# Patient Record
Sex: Male | Born: 1945 | Race: White | Hispanic: No | Marital: Married | Smoking: Former smoker
Health system: Southern US, Community
[De-identification: ages and names within clinical notes are randomized; demographics above are authoritative.]

## PROBLEM LIST (undated history)

## (undated) DIAGNOSIS — E785 Hyperlipidemia, unspecified: Secondary | ICD-10-CM

## (undated) DIAGNOSIS — E119 Type 2 diabetes mellitus without complications: Secondary | ICD-10-CM

## (undated) DIAGNOSIS — I639 Cerebral infarction, unspecified: Secondary | ICD-10-CM

## (undated) DIAGNOSIS — I499 Cardiac arrhythmia, unspecified: Secondary | ICD-10-CM

## (undated) DIAGNOSIS — I10 Essential (primary) hypertension: Secondary | ICD-10-CM

## (undated) DIAGNOSIS — K759 Inflammatory liver disease, unspecified: Secondary | ICD-10-CM

## (undated) DIAGNOSIS — I723 Aneurysm of iliac artery: Secondary | ICD-10-CM

## (undated) DIAGNOSIS — I6529 Occlusion and stenosis of unspecified carotid artery: Secondary | ICD-10-CM

## (undated) DIAGNOSIS — K219 Gastro-esophageal reflux disease without esophagitis: Secondary | ICD-10-CM

## (undated) DIAGNOSIS — C801 Malignant (primary) neoplasm, unspecified: Secondary | ICD-10-CM

## (undated) HISTORY — PX: OTHER SURGICAL HISTORY: SHX169

## (undated) HISTORY — DX: Hyperlipidemia, unspecified: E78.5

## (undated) HISTORY — DX: Cerebral infarction, unspecified: I63.9

## (undated) HISTORY — DX: Aneurysm of iliac artery: I72.3

## (undated) HISTORY — PX: CAROTID ENDARTERECTOMY: SUR193

## (undated) HISTORY — PX: KIDNEY STONE SURGERY: SHX686

## (undated) HISTORY — DX: Occlusion and stenosis of unspecified carotid artery: I65.29

## (undated) HISTORY — DX: Essential (primary) hypertension: I10

## (undated) HISTORY — DX: Cardiac arrhythmia, unspecified: I49.9

## (undated) HISTORY — PX: TONSILLECTOMY: SUR1361

---

## 2005-10-09 ENCOUNTER — Emergency Department (HOSPITAL_COMMUNITY): Admission: EM | Admit: 2005-10-09 | Discharge: 2005-10-09 | Payer: Self-pay | Admitting: Emergency Medicine

## 2008-02-05 ENCOUNTER — Emergency Department (HOSPITAL_COMMUNITY): Admission: EM | Admit: 2008-02-05 | Discharge: 2008-02-05 | Payer: Self-pay | Admitting: Emergency Medicine

## 2008-06-23 ENCOUNTER — Encounter: Admission: RE | Admit: 2008-06-23 | Discharge: 2008-06-23 | Payer: Self-pay | Admitting: Internal Medicine

## 2009-10-28 ENCOUNTER — Emergency Department (HOSPITAL_COMMUNITY): Admission: EM | Admit: 2009-10-28 | Discharge: 2009-10-28 | Payer: Self-pay | Admitting: Emergency Medicine

## 2010-06-17 LAB — POCT I-STAT, CHEM 8
Calcium, Ion: 1.08 mmol/L — ABNORMAL LOW (ref 1.12–1.32)
Chloride: 105 mEq/L (ref 96–112)
Glucose, Bld: 131 mg/dL — ABNORMAL HIGH (ref 70–99)
Sodium: 139 mEq/L (ref 135–145)
TCO2: 26 mmol/L (ref 0–100)

## 2010-06-17 LAB — POCT CARDIAC MARKERS
CKMB, poc: 1.4 ng/mL (ref 1.0–8.0)
Troponin i, poc: <0.05 ng/mL (ref 0.00–0.09)

## 2010-07-18 ENCOUNTER — Other Ambulatory Visit: Payer: Self-pay | Admitting: Internal Medicine

## 2010-07-18 DIAGNOSIS — I723 Aneurysm of iliac artery: Secondary | ICD-10-CM

## 2010-07-21 ENCOUNTER — Other Ambulatory Visit: Payer: Self-pay

## 2010-07-24 ENCOUNTER — Ambulatory Visit
Admission: RE | Admit: 2010-07-24 | Discharge: 2010-07-24 | Disposition: A | Discharge status: Home / Self Care | Payer: Medicare Other | Source: Ambulatory Visit | Attending: Internal Medicine | Admitting: Internal Medicine

## 2010-07-24 DIAGNOSIS — I723 Aneurysm of iliac artery: Secondary | ICD-10-CM

## 2010-07-24 MED ORDER — IOHEXOL 350 MG/ML SOLN
100.0000 mL | Freq: Once | INTRAVENOUS | Status: AC | PRN
  Administered 2010-07-24: 100 mL via INTRAVENOUS

## 2010-08-29 ENCOUNTER — Encounter (INDEPENDENT_AMBULATORY_CARE_PROVIDER_SITE_OTHER): Payer: Medicare Other | Admitting: Vascular Surgery

## 2010-08-29 DIAGNOSIS — I723 Aneurysm of iliac artery: Secondary | ICD-10-CM

## 2010-08-30 NOTE — Consult Note (Signed)
NEW PATIENT CONSULTATION  HAGAN, Aden DOB:  November 23, 1945                                       08/29/2010 EAVWU#:[id number]  The patient is a 65 year old male patient referred by Dr. 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 The aneurysm measures 2.7 x 2.5 cm and extends over 3 cm in length in the right common iliac artery.  The left common iliac artery is totally occluded at its origin with reconstitution of the left external and internal iliac arteries.  The infrarenal aorta has diffuse atherosclerosis.  I have reviewed the CT scan.  The patient denies any claudication symptoms in the left leg including hip, thigh, and buttock. He does play golf on a regular basis and is able to walk long distances.  CHRONIC MEDICAL PROBLEMS: 1. Type 2 diabetes mellitus. 2. Hypertension. 3. Hyperlipidemia. 4. Chronic obstructive pulmonary disease. 5. History of kidney stone. 6. Negative for coronary artery disease. 7. History of a possible remote small stroke many years ago on a CT     scan of the head, symptomatic  SOCIAL HISTORY:  He is married and is retired.  Smokes 6 cigars per day. He smoked a pack cigarettes per day for 45 to 50 years, quit in 2008. Drinks an occasional glass of wine.  FAMILY HISTORY:  Positive for diabetes in his mother.  Negative for coronary artery disease and stroke.  REVIEW OF SYSTEMS:  Denies any chest pain, dyspnea on exertion, PND, orthopnea.  He has not nocturnal leg cramps.  All other systems are negative on complete review of systems.  PHYSICAL EXAMINATION:  Vital Signs:  Blood pressure 153/69, heart rate 90, respirations 22.  General:  Well-developed, well-nourished male in no apparent distress, alert and oriented x3.  HEENT:  Exam normal for age.  EOMs intact.  LUNGS:  Clear to auscultation.  No rhonchi or wheezing.  Cardiovascular:  Regular rhythm.  No murmurs.  Carotid pulses 3+.  No  bruits.  Abdomen:  Soft, nontender, with no masses. Musculoskeletal:  Exam is free of major deformities.  Neurologic:  Exam is normal.  Skin:  Free of rashes.  Extremities:  Lower extremity exam reveals right leg has 3+ femoral popliteal and posterior tibial pulse. Left leg has absent femoral and distal pulses.  Both feet are well perfused.  He does have an asymptomatic 2.7 cm right common iliac artery aneurysm which we will need to follow with CTAs on an annual basis.  He also has significant atherosclerosis with a left common iliac artery occlusion which is asymptomatic.  He will return in 1 year with ABIs and a CT angiogram unless he develops any symptoms in the interim.    Quita Skye Hart [place], M.D. Electronically Signed  JDL/MEDQ  D:  08/29/2010  T:  08/30/2010  Job:  [id number]  cc:   Georgann Housekeeper, MD

## 2011-04-24 DIAGNOSIS — M25529 Pain in unspecified elbow: Secondary | ICD-10-CM | POA: Diagnosis not present

## 2011-05-29 DIAGNOSIS — K091 Developmental (nonodontogenic) cysts of oral region: Secondary | ICD-10-CM | POA: Diagnosis not present

## 2011-06-11 ENCOUNTER — Other Ambulatory Visit: Payer: Self-pay | Admitting: *Deleted

## 2011-06-11 DIAGNOSIS — I723 Aneurysm of iliac artery: Secondary | ICD-10-CM

## 2011-07-04 DIAGNOSIS — E119 Type 2 diabetes mellitus without complications: Secondary | ICD-10-CM | POA: Diagnosis not present

## 2011-07-04 DIAGNOSIS — I10 Essential (primary) hypertension: Secondary | ICD-10-CM | POA: Diagnosis not present

## 2011-07-04 DIAGNOSIS — E782 Mixed hyperlipidemia: Secondary | ICD-10-CM | POA: Diagnosis not present

## 2011-07-04 DIAGNOSIS — Z125 Encounter for screening for malignant neoplasm of prostate: Secondary | ICD-10-CM | POA: Diagnosis not present

## 2011-07-04 DIAGNOSIS — Z0000 Encounter for general adult medical examination without abnormal findings: Secondary | ICD-10-CM | POA: Diagnosis not present

## 2011-07-04 DIAGNOSIS — Z1331 Encounter for screening for depression: Secondary | ICD-10-CM | POA: Diagnosis not present

## 2011-07-11 DIAGNOSIS — E782 Mixed hyperlipidemia: Secondary | ICD-10-CM | POA: Diagnosis not present

## 2011-07-11 DIAGNOSIS — I10 Essential (primary) hypertension: Secondary | ICD-10-CM | POA: Diagnosis not present

## 2011-07-11 DIAGNOSIS — J449 Chronic obstructive pulmonary disease, unspecified: Secondary | ICD-10-CM | POA: Diagnosis not present

## 2011-07-11 DIAGNOSIS — Z0000 Encounter for general adult medical examination without abnormal findings: Secondary | ICD-10-CM | POA: Diagnosis not present

## 2011-07-11 DIAGNOSIS — I714 Abdominal aortic aneurysm, without rupture: Secondary | ICD-10-CM | POA: Diagnosis not present

## 2011-07-11 DIAGNOSIS — E119 Type 2 diabetes mellitus without complications: Secondary | ICD-10-CM | POA: Diagnosis not present

## 2011-08-20 ENCOUNTER — Encounter: Payer: Self-pay | Admitting: Vascular Surgery

## 2011-08-28 ENCOUNTER — Ambulatory Visit: Payer: Medicare Other | Admitting: Vascular Surgery

## 2011-08-28 ENCOUNTER — Other Ambulatory Visit: Payer: Medicare Other

## 2011-09-03 ENCOUNTER — Encounter: Payer: Self-pay | Admitting: Vascular Surgery

## 2011-09-04 ENCOUNTER — Encounter (INDEPENDENT_AMBULATORY_CARE_PROVIDER_SITE_OTHER): Payer: Medicare Other | Admitting: *Deleted

## 2011-09-04 ENCOUNTER — Ambulatory Visit (INDEPENDENT_AMBULATORY_CARE_PROVIDER_SITE_OTHER): Payer: Medicare Other | Admitting: Vascular Surgery

## 2011-09-04 ENCOUNTER — Ambulatory Visit
Admission: RE | Admit: 2011-09-04 | Discharge: 2011-09-04 | Disposition: A | Discharge status: Home / Self Care | Payer: Medicare Other | Source: Ambulatory Visit | Attending: Vascular Surgery | Admitting: Vascular Surgery

## 2011-09-04 ENCOUNTER — Encounter: Payer: Self-pay | Admitting: Vascular Surgery

## 2011-09-04 VITALS — BP 155/77 | HR 96 | Resp 20 | Ht 71.5 in | Wt 202.0 lb

## 2011-09-04 DIAGNOSIS — I723 Aneurysm of iliac artery: Secondary | ICD-10-CM

## 2011-09-04 DIAGNOSIS — I714 Abdominal aortic aneurysm, without rupture, unspecified: Secondary | ICD-10-CM

## 2011-09-04 DIAGNOSIS — I739 Peripheral vascular disease, unspecified: Secondary | ICD-10-CM

## 2011-09-04 MED ORDER — IOHEXOL 350 MG/ML SOLN
100.0000 mL | Freq: Once | INTRAVENOUS | Status: AC | PRN
  Administered 2011-09-04: 100 mL via INTRAVENOUS

## 2011-09-04 NOTE — Progress Notes (Signed)
Subjective:     Patient ID: Samuel Berger, male   DOB: February 01, 1946, 66 y.o.   MRN: [id number]  HPI this 66 year old male returns today for followup regarding a right iliac artery aneurysm discovered last year by CT scan. The scan last year revealed the aneurysm to be approximately 2.7 cm in maximum diameter. 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 He denies any abdominal or back pain. He denies any claudication symptoms in either lower extremity being able to ambulate long distances he states.  Past Medical History  Diagnosis Date  . Hypertension   . Hyperlipidemia   . Stroke   . Iliac artery aneurysm, right   . Diabetes mellitus   . Kidney stone   . CAD (coronary artery disease)     History  Substance Use Topics  . Smoking status: Current Everyday Smoker -- 0.5 packs/day for 30 years    Types: Cigars  . Smokeless tobacco: Never Used  . Alcohol Use: Yes    Family History  Problem Relation Age of Onset  . Diabetes Mother     Allergies  Allergen Reactions  . Sulfa Drugs Cross Reactors     Current outpatient prescriptions:amLODipine (NORVASC) 10 MG tablet, Take 10 mg by mouth daily., Disp: , Rfl: ;  atorvastatin (LIPITOR) 20 MG tablet, Take 20 mg by mouth daily., Disp: , Rfl: ;  lisinopril (PRINIVIL,ZESTRIL) 5 MG tablet, Take 5 mg by mouth daily., Disp: , Rfl: ;  METFORMIN HCL PO, Take by mouth., Disp: , Rfl:  No current facility-administered medications for this visit. Facility-Administered Medications Ordered in Other Visits: iohexol (OMNIPAQUE) 350 MG/ML injection 100 mL, 100 mL, Intravenous, Once PRN, Medication Radiologist, MD, 100 mL at 09/04/11 0909  BP 155/77  Pulse 96  Resp 20  Ht 5' 11.5" (1.816 m)  Wt 202 lb (91.627 kg)  BMI 27.78 kg/m2  Body mass index is 27.78 kg/(m^2).          Review of Systems denies chest pain, dyspnea on exertion, PND, orthopnea, hemoptysis, claudication.    Objective:   Physical Exam blood  pressure 155/77 heart rate 96 respirations 20 Gen.-alert and oriented x3 in no apparent distress HEENT normal for age Lungs no rhonchi or wheezing Cardiovascular regular rhythm no murmurs carotid pulses 3+ palpable no bruits audible Abdomen soft nontender no palpable masses Musculoskeletal free of  major deformities Skin clear -no rashes Neurologic normal Lower extremities 3+ femoral and dorsalis pedis pulses palpable bilaterally with no edema  Today I ordered a CT angiogram of the abdomen and pelvis which are reviewed and interpreted by computer. The right iliac artery aneurysm is unchanged. 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 Left external iliac artery remains occluded.    Assessment:     Stable right common iliac artery aneurysm and left external iliac artery occlusion with no claudication    Plan:     Return in 2 years with CT angiogram of abdomen and pelvis for continued followup and see nurse practitioner

## 2011-09-05 NOTE — Progress Notes (Signed)
Addended by: Sharee Pimple on: 09/05/2011 09:44 AM   Modules accepted: Orders

## 2011-09-18 DIAGNOSIS — H353190 Nonexudative age-related macular degeneration, unspecified eye, stage unspecified: Secondary | ICD-10-CM | POA: Diagnosis not present

## 2011-09-18 DIAGNOSIS — H2510 Age-related nuclear cataract, unspecified eye: Secondary | ICD-10-CM | POA: Diagnosis not present

## 2011-11-07 DIAGNOSIS — D485 Neoplasm of uncertain behavior of skin: Secondary | ICD-10-CM | POA: Diagnosis not present

## 2011-11-07 DIAGNOSIS — B079 Viral wart, unspecified: Secondary | ICD-10-CM | POA: Diagnosis not present

## 2011-12-06 DIAGNOSIS — I10 Essential (primary) hypertension: Secondary | ICD-10-CM | POA: Diagnosis not present

## 2011-12-06 DIAGNOSIS — R42 Dizziness and giddiness: Secondary | ICD-10-CM | POA: Diagnosis not present

## 2012-01-05 ENCOUNTER — Encounter (HOSPITAL_COMMUNITY): Payer: Self-pay | Admitting: Emergency Medicine

## 2012-01-05 ENCOUNTER — Emergency Department (HOSPITAL_COMMUNITY): Payer: Medicare Other

## 2012-01-05 ENCOUNTER — Inpatient Hospital Stay (HOSPITAL_COMMUNITY): Payer: Medicare Other

## 2012-01-05 ENCOUNTER — Inpatient Hospital Stay (HOSPITAL_COMMUNITY)
Admission: EM | Admit: 2012-01-05 | Discharge: 2012-01-08 | DRG: 062 | Disposition: A | Discharge status: Home / Self Care | Payer: Medicare Other | Attending: Neurology | Admitting: Neurology

## 2012-01-05 DIAGNOSIS — Z833 Family history of diabetes mellitus: Secondary | ICD-10-CM

## 2012-01-05 DIAGNOSIS — R5381 Other malaise: Secondary | ICD-10-CM | POA: Diagnosis not present

## 2012-01-05 DIAGNOSIS — F17200 Nicotine dependence, unspecified, uncomplicated: Secondary | ICD-10-CM | POA: Diagnosis present

## 2012-01-05 DIAGNOSIS — I2510 Atherosclerotic heart disease of native coronary artery without angina pectoris: Secondary | ICD-10-CM | POA: Diagnosis present

## 2012-01-05 DIAGNOSIS — I6340 Cerebral infarction due to embolism of unspecified cerebral artery: Principal | ICD-10-CM | POA: Diagnosis present

## 2012-01-05 DIAGNOSIS — Z7902 Long term (current) use of antithrombotics/antiplatelets: Secondary | ICD-10-CM | POA: Diagnosis not present

## 2012-01-05 DIAGNOSIS — R29898 Other symptoms and signs involving the musculoskeletal system: Secondary | ICD-10-CM | POA: Diagnosis not present

## 2012-01-05 DIAGNOSIS — R61 Generalized hyperhidrosis: Secondary | ICD-10-CM | POA: Diagnosis not present

## 2012-01-05 DIAGNOSIS — E785 Hyperlipidemia, unspecified: Secondary | ICD-10-CM | POA: Diagnosis present

## 2012-01-05 DIAGNOSIS — Z882 Allergy status to sulfonamides status: Secondary | ICD-10-CM

## 2012-01-05 DIAGNOSIS — I6789 Other cerebrovascular disease: Secondary | ICD-10-CM | POA: Diagnosis not present

## 2012-01-05 DIAGNOSIS — Z79899 Other long term (current) drug therapy: Secondary | ICD-10-CM

## 2012-01-05 DIAGNOSIS — R4701 Aphasia: Secondary | ICD-10-CM | POA: Diagnosis present

## 2012-01-05 DIAGNOSIS — I63239 Cerebral infarction due to unspecified occlusion or stenosis of unspecified carotid arteries: Secondary | ICD-10-CM | POA: Diagnosis not present

## 2012-01-05 DIAGNOSIS — Z8673 Personal history of transient ischemic attack (TIA), and cerebral infarction without residual deficits: Secondary | ICD-10-CM

## 2012-01-05 DIAGNOSIS — I639 Cerebral infarction, unspecified: Secondary | ICD-10-CM

## 2012-01-05 DIAGNOSIS — I10 Essential (primary) hypertension: Secondary | ICD-10-CM | POA: Diagnosis present

## 2012-01-05 DIAGNOSIS — I723 Aneurysm of iliac artery: Secondary | ICD-10-CM | POA: Diagnosis present

## 2012-01-05 DIAGNOSIS — G8190 Hemiplegia, unspecified affecting unspecified side: Secondary | ICD-10-CM | POA: Diagnosis present

## 2012-01-05 DIAGNOSIS — I6350 Cerebral infarction due to unspecified occlusion or stenosis of unspecified cerebral artery: Secondary | ICD-10-CM | POA: Diagnosis not present

## 2012-01-05 DIAGNOSIS — E119 Type 2 diabetes mellitus without complications: Secondary | ICD-10-CM | POA: Diagnosis not present

## 2012-01-05 DIAGNOSIS — I6529 Occlusion and stenosis of unspecified carotid artery: Secondary | ICD-10-CM | POA: Diagnosis present

## 2012-01-05 DIAGNOSIS — R29810 Facial weakness: Secondary | ICD-10-CM | POA: Diagnosis not present

## 2012-01-05 DIAGNOSIS — E041 Nontoxic single thyroid nodule: Secondary | ICD-10-CM | POA: Diagnosis not present

## 2012-01-05 DIAGNOSIS — R5383 Other fatigue: Secondary | ICD-10-CM | POA: Diagnosis not present

## 2012-01-05 DIAGNOSIS — I6509 Occlusion and stenosis of unspecified vertebral artery: Secondary | ICD-10-CM | POA: Diagnosis not present

## 2012-01-05 LAB — CBC
Hemoglobin: 15.7 g/dL (ref 13.0–17.0)
MCH: 31 pg (ref 26.0–34.0)
Platelets: 206 10*3/uL (ref 150–400)
RBC: 5.07 MIL/uL (ref 4.22–5.81)

## 2012-01-05 LAB — POCT I-STAT TROPONIN I

## 2012-01-05 LAB — URINALYSIS, ROUTINE W REFLEX MICROSCOPIC
Hgb urine dipstick: NEGATIVE
Ketones, ur: NEGATIVE mg/dL
Protein, ur: NEGATIVE mg/dL
Urobilinogen, UA: 1 mg/dL (ref 0.0–1.0)

## 2012-01-05 LAB — COMPREHENSIVE METABOLIC PANEL
ALT: 14 U/L (ref 0–53)
AST: 15 U/L (ref 0–37)
Albumin: 4.1 g/dL (ref 3.5–5.2)
Calcium: 9.8 mg/dL (ref 8.4–10.5)
Chloride: 99 mEq/L (ref 96–112)
Creatinine, Ser: 0.83 mg/dL (ref 0.50–1.35)
Sodium: 135 mEq/L (ref 135–145)

## 2012-01-05 LAB — POCT I-STAT, CHEM 8
BUN: 18 mg/dL (ref 6–23)
Calcium, Ion: 1.15 mmol/L (ref 1.13–1.30)
Creatinine, Ser: 1 mg/dL (ref 0.50–1.35)
Glucose, Bld: 121 mg/dL — ABNORMAL HIGH (ref 70–99)
TCO2: 24 mmol/L (ref 0–100)

## 2012-01-05 LAB — RAPID URINE DRUG SCREEN, HOSP PERFORMED
Amphetamines: NOT DETECTED
Cocaine: NOT DETECTED
Opiates: NOT DETECTED
Tetrahydrocannabinol: NOT DETECTED

## 2012-01-05 LAB — MRSA PCR SCREENING: MRSA by PCR: NEGATIVE

## 2012-01-05 LAB — APTT: aPTT: 27 seconds (ref 24–37)

## 2012-01-05 LAB — DIFFERENTIAL
Basophils Relative: 1 % (ref 0–1)
Eosinophils Relative: 6 % — ABNORMAL HIGH (ref 0–5)
Lymphs Abs: 4.6 10*3/uL — ABNORMAL HIGH (ref 0.7–4.0)
Monocytes Absolute: 1.9 10*3/uL — ABNORMAL HIGH (ref 0.1–1.0)
Monocytes Relative: 14 % — ABNORMAL HIGH (ref 3–12)

## 2012-01-05 MED ORDER — SODIUM CHLORIDE 0.9 % IV SOLN: INTRAVENOUS | Status: DC

## 2012-01-05 MED ORDER — ACETAMINOPHEN 325 MG PO TABS: 650.0000 mg | ORAL_TABLET | ORAL | Status: DC | PRN

## 2012-01-05 MED ORDER — ONDANSETRON HCL 4 MG/2ML IJ SOLN: 4.0000 mg | Freq: Four times a day (QID) | INTRAMUSCULAR | Status: DC | PRN

## 2012-01-05 MED ORDER — ACETAMINOPHEN 650 MG RE SUPP: 650.0000 mg | RECTAL | Status: DC | PRN

## 2012-01-05 MED ORDER — PANTOPRAZOLE SODIUM 40 MG IV SOLR
40.0000 mg | Freq: Every day | INTRAVENOUS | Status: DC
  Administered 2012-01-05: 40 mg via INTRAVENOUS
  Filled 2012-01-05 (×2): qty 40

## 2012-01-05 MED ORDER — ALTEPLASE (STROKE) FULL DOSE INFUSION
0.9000 mg/kg | Freq: Once | INTRAVENOUS | Status: AC
  Administered 2012-01-05: 83 mg via INTRAVENOUS
  Filled 2012-01-05: qty 83

## 2012-01-05 MED ORDER — SODIUM CHLORIDE 0.9 % IV SOLN
INTRAVENOUS | Status: DC
  Administered 2012-01-05: 50 mL via INTRAVENOUS

## 2012-01-05 MED ORDER — LABETALOL HCL 5 MG/ML IV SOLN: 10.0000 mg | INTRAVENOUS | Status: DC | PRN

## 2012-01-05 MED ORDER — IOHEXOL 350 MG/ML SOLN
50.0000 mL | Freq: Once | INTRAVENOUS | Status: AC | PRN
  Administered 2012-01-05: 50 mL via INTRAVENOUS

## 2012-01-05 MED ORDER — PANTOPRAZOLE SODIUM 40 MG IV SOLR: 40.0000 mg | Freq: Every day | INTRAVENOUS | Status: DC

## 2012-01-05 NOTE — ED Notes (Signed)
TPA, finished, pt being transported to CT 3 by RN, on monitor.

## 2012-01-05 NOTE — ED Notes (Addendum)
Pt was eatting at restaurant and slumped over after smoking a cigarette outside.

## 2012-01-05 NOTE — H&P (Signed)
Admission H&P    Chief Complaint: Right sided weakness HPI: Samuel Berger is an 66 y.o. male with sudden onset right sided weakness at approximately 5:20 this afternoon. He was brought by EMS and on arrival was found to have severe right sided weakness.   LSN: 17:20 tPA Given: Yes  Past Medical History  Diagnosis Date  . Hypertension   . Hyperlipidemia   . Stroke   . Iliac artery aneurysm, right   . Diabetes mellitus   . Kidney stone   . CAD (coronary artery disease)     History reviewed. No pertinent past surgical history.  Family History  Problem Relation Age of Onset  . Diabetes Mother    Social History:  reports that he has been smoking Cigars.  He has never used smokeless tobacco. He reports that he drinks alcohol. He reports that he does not use illicit drugs.  Allergies:  Allergies  Allergen Reactions  . Sulfa Drugs Cross Reactors     Medications Prior to Admission  Medication Sig Dispense Refill  . amLODipine (NORVASC) 10 MG tablet Take 10 mg by mouth daily.      Marland Kitchen atorvastatin (LIPITOR) 20 MG tablet Take 20 mg by mouth daily.      Marland Kitchen lisinopril (PRINIVIL,ZESTRIL) 5 MG tablet Take 5 mg by mouth daily.      Marland Kitchen METFORMIN HCL PO Take by mouth.        ROS: Unable to obtain as patient unable to speak.   Physical Examination: Blood pressure 141/59, pulse 89, temperature 97.8 F (36.6 C), temperature source Oral, resp. rate 21, SpO2 94.00%.  General Examination: HEENT-  Normocephalic, no lesions, without obvious abnormality. Normal pharynx. Neck supple with no masses, nodes, nodules or enlargement. Cardiovascular - regular rate and rhythm Lungs - no tachypnea, retractions or cyanosis, Heart exam - S1, S2 normal, no murmur, no gallop, rate regular Abdomen - normal findings: soft, non-tender Extremities - no edema  Neurologic Examination: Mental Status: Awake, Alert, able to only answer with one - 2 words and is very dysarthric.  Cranial Nerves: II-no clear  visual field cut but may have small right sided cut. Discs difficult to visualize.  II/IV/VI- right gaze preference, unable to cross midline to left V/VII-right facial droop, decreased sensation on right VIII-intact to voice IX/X- palate elevates symmetrically XI- decreased shrug on right XII-does not fully protrude tongue Motor: 2/5 in right arm and right leg, 5/5 in left arm/left leg Sensory: Decreased sensation on right DTR's: 2+ and symmetric in patella and biceps Cerebellar: No dysmetria on left, unable to perform on right Gait: Did not assessed secondary to patient safety concerns  Laboratory Studies:   Basic Metabolic Panel:  Lab 01/05/12 4098 01/05/12 1756  NA 138 135  K 3.7 3.7  CL 103 99  CO2 -- 23  GLUCOSE 121* 122*  BUN 18 18  CREATININE 1.00 0.83  CALCIUM -- 9.8  MG -- --  PHOS -- --    Liver Function Tests:  Lab 01/05/12 1756  AST 15  ALT 14  ALKPHOS 73  BILITOT 0.3  PROT 7.1  ALBUMIN 4.1   No results found for this basename: LIPASE:5,AMYLASE:5 in the last 168 hours No results found for this basename: AMMONIA:3 in the last 168 hours  CBC:  Lab 01/05/12 1803 01/05/12 1756  WBC -- 13.9*  NEUTROABS -- 6.6  HGB 15.6 15.7  HCT 46.0 43.7  MCV -- 86.2  PLT -- 206    Cardiac Enzymes:  Lab  01/05/12 1757  CKTOTAL --  CKMB --  CKMBINDEX --  TROPONINI <0.30    BNP: No components found with this basename: POCBNP:5  CBG:  Lab 01/05/12 1802  GLUCAP 114*    Microbiology: No results found for this or any previous visit.  Coagulation Studies:  Basename 01/05/12 1756  LABPROT 12.6  INR 0.95    Urinalysis:  Lab 01/05/12 1901  COLORURINE YELLOW  LABSPEC 1.029  PHURINE 5.0  GLUCOSEU NEGATIVE  HGBUR NEGATIVE  BILIRUBINUR SMALL*  KETONESUR NEGATIVE  PROTEINUR NEGATIVE  UROBILINOGEN 1.0  NITRITE NEGATIVE  LEUKOCYTESUR NEGATIVE    Lipid Panel:  No results found for this basename: chol, trig, hdl, cholhdl, vldl, ldlcalc    HgbA1C:   No results found for this basename: HGBA1C    Urine Drug Screen:     Component Value Date/Time   LABOPIA NONE DETECTED 01/05/2012 1902   COCAINSCRNUR NONE DETECTED 01/05/2012 1902   LABBENZ NONE DETECTED 01/05/2012 1902   AMPHETMU NONE DETECTED 01/05/2012 1902   THCU NONE DETECTED 01/05/2012 1902   LABBARB NONE DETECTED 01/05/2012 1902    Alcohol Level: No results found for this basename: ETH:2 in the last 168 hours  Other results: EKG: Sinus  Imaging: Ct Angio Head W/cm &/or Wo Cm  01/05/2012  *RADIOLOGY REPORT*  Clinical Data:  Code stroke.  Left MCA territory.  CT ANGIOGRAPHY HEAD AND NECK  Technique:  Multidetector CT imaging of the head and neck was performed using the standard protocol during bolus administration of intravenous contrast.  Multiplanar CT image reconstructions including MIPs were obtained to evaluate the vascular anatomy. Carotid stenosis measurements (when applicable) are obtained utilizing NASCET criteria, using the distal internal carotid diameter as the denominator.  Contrast: 50mL OMNIPAQUE IOHEXOL 350 MG/ML SOLN  Comparison:  Head CT same day  CTA NECK  Findings:  Lung apices are clear except for mild scarring.  No superior mediastinal pathology.  There is atherosclerosis of the aorta.  Branching pattern of brachiocephalic vessels is normal.  No origin stenoses.  The right common carotid artery is widely patent to the bifurcation.  There is severe stenosis of the proximal ICA with a string sign.  This consistent with 95% or greater stenosis.  Beyond that, the cervical internal carotid artery shows flow but is markedly diminished in caliber because of the reduced inflow.  The left common carotid arteries widely patent to the bifurcation. There is atherosclerotic disease at the carotid bifurcation. Minimal diameter of the proximal internal carotid artery is 4 mm. Compared to a more distal cervical ICA diameter of 5 mm, this indicates a 20% stenosis.  There is atherosclerosis  of the right vertebral artery origin. This is a small vessel.  Therefore, the stenosis is difficult to accurately measure.  I estimate this at 50%.  The left vertebral artery is also a small vessel.  There does not appear to be any stenosis at its origin.  Both vertebral arteries, though a small, are patent through the cervical region to the foramen magnum.  The soft tissues of the neck are unremarkable except for the thyroid nodule the lower pole on the left measuring 2 cm in diameter.  Ultrasound would be suggested to evaluate this if clinically appropriate.   Review of the MIP images confirms the above findings.  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  Beyond that, the vessel is markedly narrowed because of diminished inflow.  20% stenosis of the proximal left internal carotid  artery.  Both vertebral arteries are diminutive vessels but are patent through the neck.  I estimate there is a 50% stenosis of the proximal right vertebral artery.  CTA HEAD  Findings:  Both internal carotid arteries are patent through the skull base, with the right being a diminutive vessel because of reduced inflow.  There is extensive atherosclerotic calcification in both carotid siphon regions.  Stenosis in that region is estimated at 50-70% bilaterally.  The right anterior and middle cerebral vessels are patent without proximal stenosis, aneurysm or vascular malformation.  The left anterior and middle cerebral vessels are patent.  The M1 segment shows pronounced irregularity.  Stenosis at the M1 origin is estimated at 50%.  I do not see an intraluminal filling defect at this moment however.  No missing distal branches are appreciated.  The right vertebral artery is a small vessel that appears to be patent to the basilar.  The left vertebral artery is a small vessel patent to the foramen magnum but without demonstrable flow beyond that.  There is reconstitution or retrograde flow within  the distal left vertebral.  The basilar artery is a small vessel but does show flow.  Both posterior cerebral arteries receive there supply from the anterior circulation.  The brain shows old small vessel infarctions within the deep white matter.  There is no identifiable acute infarction.  No intracranial hemorrhage.   Review of the MIP images confirms the above findings.  IMPRESSION: The right internal carotid artery is diminutive because of reduced inflow.  Both internal carotid arteries are patent through the siphon region.  There is extensive calcific atherosclerosis of both carotid siphons with stenoses on the order of 50-70% bilaterally.  Both anterior and middle cerebral vessels are patent.  No evidence of intraluminal filling defect.  There is irregularity of the M1 segment on the left with stenosis of the left M1 segment of 50%. No missing distal branch vessels are identified.  Both vertebral arteries are diminutive.  The right vertebral artery is patent to the basilar.  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  There is reconstituted or retrograde flow in the distal left vertebral artery.  The basilar artery is a very diminutive vessel but does show flow.  This vessel supplies both superior cerebellar arteries.  Both posterior cerebral arteries receive most to their supply from the anterior circulation.   Original Report Authenticated By: Thomasenia Sales, M.D.    Ct Head Wo Contrast  01/05/2012  *RADIOLOGY REPORT*  Clinical Data: Code stroke.  Right-sided weakness.  Right facial droop.  CT HEAD WITHOUT CONTRAST  Technique:  Contiguous axial images were obtained from the base of the skull through the vertex without contrast.  Comparison: 06/23/2008  Findings: The cerebellum, brain stem, cerebral peduncles, and thalami appear normal.  A small remote lacunar infarct involving the anterior limb of the left internal capsule and head of the left caudate nucleus is observed.  There  is also a small lacunar infarct in the head of the right caudate nucleus appears old.  The basal ganglia appear otherwise unremarkable.  Ventricular system and basilar cisterns unremarkable.  Mild chronic left maxillary sinusitis noted. No intracranial hemorrhage, mass lesion, or acute infarction is identified.  IMPRESSION:  1.  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 No acute intracranial findings. 2.  Mild chronic left maxillary sinusitis.  I discussed these findings by telephone with  Dr. Viviann Spare Rancour at 6:11 p.m. on 01/05/2012.   Original Report Authenticated By: Dellia Cloud, M.D.    Ct Angio Neck W/cm &/or Wo/cm  01/05/2012  *RADIOLOGY REPORT*  Clinical Data:  Code stroke.  Left MCA territory.  CT ANGIOGRAPHY HEAD AND NECK  Technique:  Multidetector CT imaging of the head and neck was performed using the standard protocol during bolus administration of intravenous contrast.  Multiplanar CT image reconstructions including MIPs were obtained to evaluate the vascular anatomy. Carotid stenosis measurements (when applicable) are obtained utilizing NASCET criteria, using the distal internal carotid diameter as the denominator.  Contrast: 50mL OMNIPAQUE IOHEXOL 350 MG/ML SOLN  Comparison:  Head CT same day  CTA NECK  Findings:  Lung apices are clear except for mild scarring.  No superior mediastinal pathology.  There is atherosclerosis of the aorta.  Branching pattern of brachiocephalic vessels is normal.  No origin stenoses.  The right common carotid artery is widely patent to the bifurcation.  There is severe stenosis of the proximal ICA with a string sign.  This consistent with 95% or greater stenosis.  Beyond that, the cervical internal carotid artery shows flow but is markedly diminished in caliber because of the reduced inflow.  The left common carotid arteries widely patent to the bifurcation. There is  atherosclerotic disease at the carotid bifurcation. Minimal diameter of the proximal internal carotid artery is 4 mm. Compared to a more distal cervical ICA diameter of 5 mm, this indicates a 20% stenosis.  There is atherosclerosis of the right vertebral artery origin. This is a small vessel.  Therefore, the stenosis is difficult to accurately measure.  I estimate this at 50%.  The left vertebral artery is also a small vessel.  There does not appear to be any stenosis at its origin.  Both vertebral arteries, though a small, are patent through the cervical region to the foramen magnum.  The soft tissues of the neck are unremarkable except for the thyroid nodule the lower pole on the left measuring 2 cm in diameter.  Ultrasound would be suggested to evaluate this if clinically appropriate.   Review of the MIP images confirms the above findings.  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  Beyond that, the vessel is markedly narrowed because of diminished inflow.  20% stenosis of the proximal left internal carotid artery.  Both vertebral arteries are diminutive vessels but are patent through the neck.  I estimate there is a 50% stenosis of the proximal right vertebral artery.  CTA HEAD  Findings:  Both internal carotid arteries are patent through the skull base, with the right being a diminutive vessel because of reduced inflow.  There is extensive atherosclerotic calcification in both carotid siphon regions.  Stenosis in that region is estimated at 50-70% bilaterally.  The right anterior and middle cerebral vessels are patent without proximal stenosis, aneurysm or vascular malformation.  The left anterior and middle cerebral vessels are patent.  The M1 segment shows pronounced irregularity.  Stenosis at the M1 origin is estimated at 50%.  I do not see an intraluminal filling defect at this moment however.  No missing distal branches are appreciated.  The right vertebral  artery is a small vessel that appears to be patent to the basilar.  The left vertebral artery is a small vessel patent to the foramen magnum but without demonstrable flow beyond that.  There is reconstitution or retrograde flow within the distal left vertebral.  The basilar artery is a small vessel but does show flow.  Both posterior cerebral arteries receive there supply from the anterior circulation.  The brain shows old small vessel infarctions within the deep white matter.  There is no identifiable acute infarction.  No intracranial hemorrhage.   Review of the MIP images confirms the above findings.  IMPRESSION: The right internal carotid artery is diminutive because of reduced inflow.  Both internal carotid arteries are patent through the siphon region.  There is extensive calcific atherosclerosis of both carotid siphons with stenoses on the order of 50-70% bilaterally.  Both anterior and middle cerebral vessels are patent.  No evidence of intraluminal filling defect.  There is irregularity of the M1 segment on the left with stenosis of the left M1 segment of 50%. No missing distal branch vessels are identified.  Both vertebral arteries are diminutive.  The right vertebral artery is patent to the basilar.  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  There is reconstituted or retrograde flow in the distal left vertebral artery.  The basilar artery is a very diminutive vessel but does show flow.  This vessel supplies both superior cerebellar arteries.  Both posterior cerebral arteries receive most to their supply from the anterior circulation.   Original Report Authenticated By: Thomasenia Sales, M.D.     Assessment: 66 y.o. male with a history of htn, hpl, dm with signs and symptoms of left MCA occlusion. tPA was given with resolution of symptoms.   Stroke Risk Factors - carotid stenosis, diabetes mellitus, hyperlipidemia and hypertension  Plan: 1. HgbA1c, fasting lipid panel 2.  MRI of the brain without contrast 3. PT consult, OT consult, Speech consult 4. Echocardiogram 5. Carotid dopplers 6. Prophylactic therapy-holding 2/2 tPA administration.  7. Risk factor modification 8. Telemetry monitoring 9. Frequent neuro checks  Ritta Slot, MD [facility] [phone]  01/05/2012, 8:11 PM

## 2012-01-05 NOTE — ED Notes (Signed)
tPA at bedside.

## 2012-01-05 NOTE — ED Notes (Signed)
Neurologist aware full resolve ment of symptoms; verbal order to continue tPA until finished.

## 2012-01-05 NOTE — ED Notes (Signed)
Pt returned from CT to room Trauma C. Primary RN calling report. Pt remains alert no problems in CT.

## 2012-01-05 NOTE — ED Notes (Signed)
Pt was last seen normal

## 2012-01-05 NOTE — ED Notes (Signed)
Pharmacy notified to mix tPA

## 2012-01-05 NOTE — ED Notes (Signed)
Pt arrived to ED via ambulance. EDP exam and phlebotomist, neurologist and RN at bedside.

## 2012-01-05 NOTE — ED Notes (Signed)
Stroke team arrival

## 2012-01-05 NOTE — ED Notes (Signed)
Ct complete and read by neurologist

## 2012-01-05 NOTE — Code Documentation (Signed)
Called to ED for Code Stroke patient.  66 year old male out eating with some friends and had sudden onset slump over with difficulty speaking.  Witnessed at 1730.  Code stroke called at 1735. Arrival to ED at 1747.  EDP exam at 1747 with neurologist at bedside also - stroke team arrival at 1747.  To CT scan at 1749.  CT read by Dr. Amada Jupiter at [phone].  NIHSS 19.  Patient can follow commands - nods appropriately to questions - denies recent surgery or anticoags.  Dr. Amada Jupiter speaking on phone with wife who is in [place] at this time.  Pharmacy notified at 1803.  Tpa to bedside at 1816.  Needle time 1818.

## 2012-01-05 NOTE — ED Notes (Signed)
Pt in CT scanner

## 2012-01-05 NOTE — ED Notes (Signed)
Wife updated by RN; pt spoke to her.

## 2012-01-05 NOTE — ED Notes (Signed)
Calling report now. 

## 2012-01-05 NOTE — ED Provider Notes (Signed)
History     CSN: [id number]  Arrival date & time 01/05/12  1752   First MD Initiated Contact with Patient 01/05/12 1752      Chief Complaint  Patient presents with  . Code Stroke    (Consider location/radiation/quality/duration/timing/severity/associated sxs/prior treatment) Patient is a 65 y.o. male presenting with neurologic complaint. The history is provided by the patient and the EMS personnel.  Neurologic Problem The primary symptoms include focal weakness and speech change. Primary symptoms do not include headaches, syncope, loss of consciousness, seizures, dizziness, visual change, fever, nausea or vomiting. The symptoms began less than 1 hour ago. The symptoms are unchanged. Context: while eating with friends, slumped over.  Weakness began less than 1 hour ago. The weakness is unchanged. There is no ability to contract the muscle with maximum physical effort.  Region/motion of weakness: right side of body, including right eye lid, with left facial droop  There is impairment of the following actions: articulating words, sticking tongue out (deviates to left) and reaching upwards.  Change in speech began less than 1 hour ago. The speech change is unchanged. Features of the speech change include inability to articulate.    Past Medical History  Diagnosis Date  . Hypertension   . Hyperlipidemia   . Stroke   . Iliac artery aneurysm, right   . Diabetes mellitus   . Kidney stone   . CAD (coronary artery disease)     No past surgical history on file.  Family History  Problem Relation Age of Onset  . Diabetes Mother     History  Substance Use Topics  . Smoking status: Current Every Day Smoker -- 0.5 packs/day for 30 years    Types: Cigars  . Smokeless tobacco: Never Used  . Alcohol Use: Yes      Review of Systems  Unable to perform ROS: Other  Constitutional: Negative for fever.  Cardiovascular: Negative for syncope.  Gastrointestinal: Negative for nausea and  vomiting.  Neurological: Positive for speech change and focal weakness. Negative for dizziness, seizures, loss of consciousness and headaches.    Allergies  Sulfa drugs cross reactors  Home Medications   Current Outpatient Rx  Name Route Sig Dispense Refill  . AMLODIPINE BESYLATE 10 MG PO TABS Oral Take 10 mg by mouth daily.    . ATORVASTATIN CALCIUM 20 MG PO TABS Oral Take 20 mg by mouth daily.    Marland Kitchen LISINOPRIL 5 MG PO TABS Oral Take 5 mg by mouth daily.    Marland Kitchen METFORMIN HCL PO Oral Take by mouth.      There were no vitals taken for this visit.  Physical Exam  Nursing note and vitals reviewed. Constitutional: He appears well-developed and well-nourished. No distress.  HENT:  Head: Normocephalic and atraumatic.  Eyes: Pupils are equal, round, and reactive to light.  Cardiovascular: Normal rate and normal heart sounds.   Pulmonary/Chest: Effort normal and breath sounds normal. No respiratory distress.  Abdominal: Soft. He exhibits no distension. There is no tenderness.  Musculoskeletal: Normal range of motion.  Neurological: He is alert. A cranial nerve deficit is present. He exhibits abnormal muscle tone. Coordination abnormal. GCS eye subscore is 4. GCS verbal subscore is 2. GCS motor subscore is 6.       Pt unable to move either right arm or leg beyond muscle twitch. When eyes held closed by patient, able to easily open right eye with left staying closed. Pt with left facial droop. Speech limited to yes/no, all other  words not able to understand.  Skin: Skin is warm and dry.  Psychiatric: He has a normal mood and affect.    ED Course  Procedures (including critical care time)  Labs Reviewed  GLUCOSE, CAPILLARY - Abnormal; Notable for the following:    Glucose-Capillary 114 (*)     All other components within normal limits  POCT I-STAT, CHEM 8 - Abnormal; Notable for the following:    Glucose, Bld 121 (*)     All other components within normal limits  POCT I-STAT TROPONIN I   PROTIME-INR  APTT  CBC  DIFFERENTIAL  COMPREHENSIVE METABOLIC PANEL  TROPONIN I  URINE RAPID DRUG SCREEN (HOSP PERFORMED)  URINALYSIS, ROUTINE W REFLEX MICROSCOPIC   Ct Head Wo Contrast  01/05/2012  *RADIOLOGY REPORT*  Clinical Data: Code stroke.  Right-sided weakness.  Right facial droop.  CT HEAD WITHOUT CONTRAST  Technique:  Contiguous axial images were obtained from the base of the skull through the vertex without contrast.  Comparison: 06/23/2008  Findings: The cerebellum, brain stem, cerebral peduncles, and thalami appear normal.  A small remote lacunar infarct involving the anterior limb of the left internal capsule and head of the left caudate nucleus is observed.  There is also a small lacunar infarct in the head of the right caudate nucleus appears old.  The basal ganglia appear otherwise unremarkable.  Ventricular system and basilar cisterns unremarkable.  Mild chronic left maxillary sinusitis noted. No intracranial hemorrhage, mass lesion, or acute infarction is identified.  IMPRESSION:  1.  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 No acute intracranial findings. 2.  Mild chronic left maxillary sinusitis.  I discussed these findings by telephone with Dr. Viviann Spare Rancour at 6:11 p.m. on 01/05/2012.   Original Report Authenticated By: Dellia Cloud, M.D.      No diagnosis found.    MDM  5:54 PM Code stroke activated at arrival in ED. Pt with new right side weakness just prior to arrival as well as inability to speak. Pt following commands. Will get CT head as concern for acute CVA.  6:15 PM Neurology has decided to give tPA to the patient due to the severity of his symptoms.   6:39 PM Pt with complete resolution of his symptoms with tPA. He will be admitted to neurology.   Daleen Bo, MD 01/06/12 [phone]

## 2012-01-06 ENCOUNTER — Inpatient Hospital Stay (HOSPITAL_COMMUNITY): Payer: Medicare Other

## 2012-01-06 DIAGNOSIS — I63239 Cerebral infarction due to unspecified occlusion or stenosis of unspecified carotid arteries: Secondary | ICD-10-CM | POA: Diagnosis not present

## 2012-01-06 DIAGNOSIS — I6789 Other cerebrovascular disease: Secondary | ICD-10-CM | POA: Diagnosis not present

## 2012-01-06 DIAGNOSIS — I6350 Cerebral infarction due to unspecified occlusion or stenosis of unspecified cerebral artery: Secondary | ICD-10-CM

## 2012-01-06 LAB — GLUCOSE, CAPILLARY
Glucose-Capillary: 109 mg/dL — ABNORMAL HIGH (ref 70–99)
Glucose-Capillary: 118 mg/dL — ABNORMAL HIGH (ref 70–99)
Glucose-Capillary: 246 mg/dL — ABNORMAL HIGH (ref 70–99)

## 2012-01-06 LAB — LIPID PANEL
Cholesterol: 140 mg/dL (ref 0–200)
HDL: 43 mg/dL (ref >39)
Total CHOL/HDL Ratio: 3.3 RATIO
Triglycerides: 71 mg/dL (ref <150)

## 2012-01-06 LAB — HEMOGLOBIN A1C: Mean Plasma Glucose: 146 mg/dL — ABNORMAL HIGH (ref <117)

## 2012-01-06 MED ORDER — STROKE: EARLY STAGES OF RECOVERY BOOK
Freq: Once | Status: DC
  Filled 2012-01-06: qty 1

## 2012-01-06 MED ORDER — ASPIRIN 325 MG PO TABS
325.0000 mg | ORAL_TABLET | Freq: Every day | ORAL | Status: DC
  Administered 2012-01-06: 325 mg via ORAL
  Filled 2012-01-06 (×2): qty 1

## 2012-01-06 MED ORDER — INSULIN ASPART 100 UNIT/ML ~~LOC~~ SOLN
0.0000 [IU] | Freq: Three times a day (TID) | SUBCUTANEOUS | Status: DC
  Administered 2012-01-06 – 2012-01-07 (×2): 1 [IU] via SUBCUTANEOUS

## 2012-01-06 MED ORDER — PANTOPRAZOLE SODIUM 40 MG PO TBEC
40.0000 mg | DELAYED_RELEASE_TABLET | Freq: Every day | ORAL | Status: DC
  Administered 2012-01-06 – 2012-01-07 (×2): 40 mg via ORAL
  Filled 2012-01-06 (×2): qty 1

## 2012-01-06 NOTE — ED Provider Notes (Signed)
I saw and evaluated the patient, reviewed the resident's note and I agree with the findings and plan.  Acute onset of dysarthria and r sided weakness within the past 1 hour. Code stroke called on arrival. Patient to be given tPA as he has no absolute contraindications.  CRITICAL CARE Performed by: Glynn Octave   Total critical care time: 30  Critical care time was exclusive of separately billable procedures and treating other patients.  Critical care was necessary to treat or prevent imminent or life-threatening deterioration.  Critical care was time spent personally by me on the following activities: development of treatment plan with patient and/or surrogate as well as nursing, discussions with consultants, evaluation of patient's response to treatment, examination of patient, obtaining history from patient or surrogate, ordering and performing treatments and interventions, ordering and review of laboratory studies, ordering and review of radiographic studies, pulse oximetry and re-evaluation of patient's condition.]  Glynn Octave, MD 01/06/12 [phone]

## 2012-01-06 NOTE — Progress Notes (Signed)
01/06/2012 [name] DPT PAGER: [phone] OFFICE: [phone]   

## 2012-01-06 NOTE — Progress Notes (Signed)
*  PRELIMINARY RESULTS* Echocardiogram 2D Echocardiogram has been performed.  Jeryl Columbia 01/06/2012, 2:15 PM

## 2012-01-06 NOTE — Consult Note (Signed)
[facility]  New Carotid Patient  Referred by:  Dr. Stephanie Acre  Reason for referral: R carotid stenosis  History of Present Illness  Samuel Berger is a 66 y.o. (1945/04/13) male who presents with chief complaint: stroke.  The patient had acute onset of RIGHT sided weakness and dysarthria yesterday afternoon and came to the hospital.  He was rapidly evaluated and given tPA.  His neurologic deficits have complete resolved since then.  The patient has never had amaurosis fugax or monocular blindness.  The patient has never had facial drooping or hemiplegia.  The patient has a prior episode of expressive aphasia that only lasted a minute.   The patient's risks factors for carotid disease include: HTN, Hyperlipidemia, DM, and smoking.  Pt is being seen by Dr. 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  The patient has not had a complaints in either leg.  Past Medical History  Diagnosis Date  . Hypertension   . Hyperlipidemia   . Stroke   . Iliac artery aneurysm, right   . Diabetes mellitus   . Kidney stone   . CAD (coronary artery disease)     History reviewed. No pertinent past surgical history.  History   Social History  . Marital Status: Single    Spouse Name: N/A    Number of Children: N/A  . Years of Education: N/A   Occupational History  . Not on file.   Social History Main Topics  . Smoking status: Current Every Day Smoker -- 0.5 packs/day for 30 years    Types: Cigars  . Smokeless tobacco: Never Used  . Alcohol Use: Yes  . Drug Use: No  . Sexually Active: Not on file   Other Topics Concern  . Not on file   Social History Narrative  . No narrative on file    Family History  Problem Relation Age of Onset  . Diabetes Mother     No current facility-administered medications on file prior to encounter.   Current Outpatient Prescriptions on File Prior to Encounter  Medication Sig Dispense Refill  . amLODipine (NORVASC) 10 MG tablet  Take 10 mg by mouth daily.      Marland Kitchen atorvastatin (LIPITOR) 20 MG tablet Take 20 mg by mouth daily.      Marland Kitchen lisinopril (PRINIVIL,ZESTRIL) 5 MG tablet Take 5 mg by mouth daily.      Marland Kitchen METFORMIN HCL PO Take by mouth.        Allergies  Allergen Reactions  . Sulfa Drugs Cross Reactors     REVIEW OF SYSTEMS:  (Positives checked otherwise negative)  CARDIOVASCULAR:  [ ]  chest pain, [ ]  chest pressure, [ ]  palpitations, [ ]  shortness of breath when laying flat, [ ]  shortness of breath with exertion,   [ ]  pain in feet when walking, [ ]  pain in feet when laying flat, [ ]  history of blood clot in veins (DVT), [ ]  history of phlebitis, [ ]  swelling in legs, [ ]  varicose veins  PULMONARY:  [ ]  productive cough, [ ]  asthma, [ ]  wheezing  NEUROLOGIC:  [x]  weakness in arms or legs, [ ]  numbness in arms or legs, [x]  difficulty speaking or slurred speech, [ ]  temporary loss of vision in one eye, [ ]  dizziness  HEMATOLOGIC:  [ ]  bleeding problems, [ ]  problems with blood clotting too easily  MUSCULOSKEL:  [ ]  joint pain, [ ]  joint swelling  GASTROINTEST:  [ ]   Vomiting blood, [ ]   Blood in stool     GENITOURINARY:  [ ]   Burning with urination, [ ]   Blood in urine  PSYCHIATRIC:  [ ]  history of major depression  INTEGUMENTARY:  [ ]  rashes, [ ]  ulcers  CONSTITUTIONAL:  [ ]  fever, [ ]  chills  Physical Examination  Filed Vitals:   01/06/12 0600 01/06/12 0630 01/06/12 0700 01/06/12 0800  BP: 132/65 147/71 121/71 135/61  Pulse: 72 67 70 74  Temp: 97.3 F (36.3 C) 97.5 F (36.4 C) 97.5 F (36.4 C) 97.5 F (36.4 C)  TempSrc:      Resp: 19 17 16 19   Height:    6' (1.829 m)  Weight:    191 lb 9.3 oz (86.9 kg)  SpO2: 96% 96% 94% 94%   Body mass index is 25.98 kg/(m^2).  General: A&O x 3, WDWN  Head: [place]/AT  Ear/Nose/Throat: Hearing grossly intact, nares w/o erythema or drainage, oropharynx w/o Erythema/Exudate  Eyes: PERRLA, EOMI  Neck: Supple, no nuchal rigidity, no palpable  LAD  Pulmonary: Sym exp, good air movt, CTAB, no rales, rhonchi, & wheezing  Cardiac: RRR, Nl S1, S2, no Murmurs, rubs or gallops  Vascular: Vessel Right Left  Radial Palpable Palpable  Ulnar Palpable Palpable  Brachial Palpable Palpable  Carotid Palpable, without bruit Palpable, without bruit  Aorta Not palpable N/A  Femoral Palpable Palpable  Popliteal Not palpable Not palpable  PT Palpable Not Palpable  DP Palpable Not Palpable   Gastrointestinal: soft, NTND, -G/R, - HSM, - masses, - CVAT B  Musculoskeletal: M/S 5/5 throughout , Extremities without ischemic changes   Neurologic: CN 2-12 intact , Pain and light touch intact in extremities , Speech is fluid, Motor exam as listed above  Psychiatric: Judgment intact, Mood & affect appropriate for pt's clinical situation  Dermatologic: See M/S exam for extremity exam, no rashes otherwise noted  Lymph : No Cervical, Axillary, or Inguinal lymphadenopathy   Laboratory: CBC:    Component Value Date/Time   WBC 13.9* 01/05/2012 1756   RBC 5.07 01/05/2012 1756   HGB 15.6 01/05/2012 1803   HCT 46.0 01/05/2012 1803   PLT 206 01/05/2012 1756   MCV 86.2 01/05/2012 1756   MCH 31.0 01/05/2012 1756   MCHC 35.9 01/05/2012 1756   RDW 13.5 01/05/2012 1756   LYMPHSABS 4.6* 01/05/2012 1756   MONOABS 1.9* 01/05/2012 1756   EOSABS 0.8* 01/05/2012 1756   BASOSABS 0.1 01/05/2012 1756    BMP:    Component Value Date/Time   NA 138 01/05/2012 1803   K 3.7 01/05/2012 1803   CL 103 01/05/2012 1803   CO2 23 01/05/2012 1756   GLUCOSE 121* 01/05/2012 1803   BUN 18 01/05/2012 1803   CREATININE 1.00 01/05/2012 1803   CALCIUM 9.8 01/05/2012 1756   GFRNONAA 89* 01/05/2012 1756   GFRAA >90 01/05/2012 1756    Coagulation: Lab Results  Component Value Date   INR 0.95 01/05/2012   No results found for this basename: PTT     Radiology Ct Angio Head W/cm &/or Wo Cm  01/05/2012  *RADIOLOGY REPORT*  Clinical Data:  Code stroke.  Left MCA territory.  CT  ANGIOGRAPHY HEAD AND NECK  Technique:  Multidetector CT imaging of the head and neck was performed using the standard protocol during bolus administration of intravenous contrast.  Multiplanar CT image reconstructions including MIPs were obtained to evaluate the vascular anatomy. Carotid stenosis measurements (when applicable) are obtained utilizing NASCET criteria, using the distal internal carotid diameter as the denominator.  Contrast: 50mL OMNIPAQUE IOHEXOL 350 MG/ML SOLN  Comparison:  Head CT same day  CTA NECK  Findings:  Lung apices are clear except for mild scarring.  No superior mediastinal pathology.  There is atherosclerosis of the aorta.  Branching pattern of brachiocephalic vessels is normal.  No origin stenoses.  The right common carotid artery is widely patent to the bifurcation.  There is severe stenosis of the proximal ICA with a string sign.  This consistent with 95% or greater stenosis.  Beyond that, the cervical internal carotid artery shows flow but is markedly diminished in caliber because of the reduced inflow.  The left common carotid arteries widely patent to the bifurcation. There is atherosclerotic disease at the carotid bifurcation. Minimal diameter of the proximal internal carotid artery is 4 mm. Compared to a more distal cervical ICA diameter of 5 mm, this indicates a 20% stenosis.  There is atherosclerosis of the right vertebral artery origin. This is a small vessel.  Therefore, the stenosis is difficult to accurately measure.  I estimate this at 50%.  The left vertebral artery is also a small vessel.  There does not appear to be any stenosis at its origin.  Both vertebral arteries, though a small, are patent through the cervical region to the foramen magnum.  The soft tissues of the neck are unremarkable except for the thyroid nodule the lower pole on the left measuring 2 cm in diameter.  Ultrasound would be suggested to evaluate this if clinically appropriate.   Review of the MIP  images confirms the above findings.  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  Beyond that, the vessel is markedly narrowed because of diminished inflow.  20% stenosis of the proximal left internal carotid artery.  Both vertebral arteries are diminutive vessels but are patent through the neck.  I estimate there is a 50% stenosis of the proximal right vertebral artery.  CTA HEAD  Findings:  Both internal carotid arteries are patent through the skull base, with the right being a diminutive vessel because of reduced inflow.  There is extensive atherosclerotic calcification in both carotid siphon regions.  Stenosis in that region is estimated at 50-70% bilaterally.  The right anterior and middle cerebral vessels are patent without proximal stenosis, aneurysm or vascular malformation.  The left anterior and middle cerebral vessels are patent.  The M1 segment shows pronounced irregularity.  Stenosis at the M1 origin is estimated at 50%.  I do not see an intraluminal filling defect at this moment however.  No missing distal branches are appreciated.  The right vertebral artery is a small vessel that appears to be patent to the basilar.  The left vertebral artery is a small vessel patent to the foramen magnum but without demonstrable flow beyond that.  There is reconstitution or retrograde flow within the distal left vertebral.  The basilar artery is a small vessel but does show flow.  Both posterior cerebral arteries receive there supply from the anterior circulation.  The brain shows old small vessel infarctions within the deep white matter.  There is no identifiable acute infarction.  No intracranial hemorrhage.   Review of the MIP images confirms the above findings.  IMPRESSION: The right internal carotid artery is diminutive because of reduced inflow.  Both internal carotid arteries are patent through the siphon region.  There is extensive calcific atherosclerosis of both  carotid siphons with stenoses on the order of 50-70% bilaterally.  Both anterior and middle  cerebral vessels are patent.  No evidence of intraluminal filling defect.  There is irregularity of the M1 segment on the left with stenosis of the left M1 segment of 50%. No missing distal branch vessels are identified.  Both vertebral arteries are diminutive.  The right vertebral artery is patent to the basilar.  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  There is reconstituted or retrograde flow in the distal left vertebral artery.  The basilar artery is a very diminutive vessel but does show flow.  This vessel supplies both superior cerebellar arteries.  Both posterior cerebral arteries receive most to their supply from the anterior circulation.   Original Report Authenticated By: Thomasenia Sales, M.D.    Ct Head Wo Contrast  01/05/2012  *RADIOLOGY REPORT*  Clinical Data: Code stroke.  Right-sided weakness.  Right facial droop.  CT HEAD WITHOUT CONTRAST  Technique:  Contiguous axial images were obtained from the base of the skull through the vertex without contrast.  Comparison: 06/23/2008  Findings: The cerebellum, brain stem, cerebral peduncles, and thalami appear normal.  A small remote lacunar infarct involving the anterior limb of the left internal capsule and head of the left caudate nucleus is observed.  There is also a small lacunar infarct in the head of the right caudate nucleus appears old.  The basal ganglia appear otherwise unremarkable.  Ventricular system and basilar cisterns unremarkable.  Mild chronic left maxillary sinusitis noted. No intracranial hemorrhage, mass lesion, or acute infarction is identified.  IMPRESSION:  1.  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 No acute intracranial findings. 2.  Mild chronic left maxillary sinusitis.  I discussed these findings by telephone with Dr.  Viviann Spare Rancour at 6:11 p.m. on 01/05/2012.   Original Report Authenticated By: Dellia Cloud, M.D.    Ct Angio Neck W/cm &/or Wo/cm  01/05/2012  *RADIOLOGY REPORT*  Clinical Data:  Code stroke.  Left MCA territory.  CT ANGIOGRAPHY HEAD AND NECK  Technique:  Multidetector CT imaging of the head and neck was performed using the standard protocol during bolus administration of intravenous contrast.  Multiplanar CT image reconstructions including MIPs were obtained to evaluate the vascular anatomy. Carotid stenosis measurements (when applicable) are obtained utilizing NASCET criteria, using the distal internal carotid diameter as the denominator.  Contrast: 50mL OMNIPAQUE IOHEXOL 350 MG/ML SOLN  Comparison:  Head CT same day  CTA NECK  Findings:  Lung apices are clear except for mild scarring.  No superior mediastinal pathology.  There is atherosclerosis of the aorta.  Branching pattern of brachiocephalic vessels is normal.  No origin stenoses.  The right common carotid artery is widely patent to the bifurcation.  There is severe stenosis of the proximal ICA with a string sign.  This consistent with 95% or greater stenosis.  Beyond that, the cervical internal carotid artery shows flow but is markedly diminished in caliber because of the reduced inflow.  The left common carotid arteries widely patent to the bifurcation. There is atherosclerotic disease at the carotid bifurcation. Minimal diameter of the proximal internal carotid artery is 4 mm. Compared to a more distal cervical ICA diameter of 5 mm, this indicates a 20% stenosis.  There is atherosclerosis of the right vertebral artery origin. This is a small vessel.  Therefore, the stenosis is difficult to accurately measure.  I estimate this at 50%.  The left vertebral artery is also a small  vessel.  There does not appear to be any stenosis at its origin.  Both vertebral arteries, though a small, are patent through the cervical region to the foramen magnum.   The soft tissues of the neck are unremarkable except for the thyroid nodule the lower pole on the left measuring 2 cm in diameter.  Ultrasound would be suggested to evaluate this if clinically appropriate.   Review of the MIP images confirms the above findings.  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  Beyond that, the vessel is markedly narrowed because of diminished inflow.  20% stenosis of the proximal left internal carotid artery.  Both vertebral arteries are diminutive vessels but are patent through the neck.  I estimate there is a 50% stenosis of the proximal right vertebral artery.  CTA HEAD  Findings:  Both internal carotid arteries are patent through the skull base, with the right being a diminutive vessel because of reduced inflow.  There is extensive atherosclerotic calcification in both carotid siphon regions.  Stenosis in that region is estimated at 50-70% bilaterally.  The right anterior and middle cerebral vessels are patent without proximal stenosis, aneurysm or vascular malformation.  The left anterior and middle cerebral vessels are patent.  The M1 segment shows pronounced irregularity.  Stenosis at the M1 origin is estimated at 50%.  I do not see an intraluminal filling defect at this moment however.  No missing distal branches are appreciated.  The right vertebral artery is a small vessel that appears to be patent to the basilar.  The left vertebral artery is a small vessel patent to the foramen magnum but without demonstrable flow beyond that.  There is reconstitution or retrograde flow within the distal left vertebral.  The basilar artery is a small vessel but does show flow.  Both posterior cerebral arteries receive there supply from the anterior circulation.  The brain shows old small vessel infarctions within the deep white matter.  There is no identifiable acute infarction.  No intracranial hemorrhage.   Review of the MIP images confirms the  above findings.  IMPRESSION: The right internal carotid artery is diminutive because of reduced inflow.  Both internal carotid arteries are patent through the siphon region.  There is extensive calcific atherosclerosis of both carotid siphons with stenoses on the order of 50-70% bilaterally.  Both anterior and middle cerebral vessels are patent.  No evidence of intraluminal filling defect.  There is irregularity of the M1 segment on the left with stenosis of the left M1 segment of 50%. No missing distal branch vessels are identified.  Both vertebral arteries are diminutive.  The right vertebral artery is patent to the basilar.  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  There is reconstituted or retrograde flow in the distal left vertebral artery.  The basilar artery is a very diminutive vessel but does show flow.  This vessel supplies both superior cerebellar arteries.  Both posterior cerebral arteries receive most to their supply from the anterior circulation.   Original Report Authenticated By: Thomasenia Sales, M.D.    Dg Chest Port 1 View  01/05/2012   *RADIOLOGY REPORT*  Clinical Data: Hypertension, possible aspiration  PORTABLE CHEST - 1 VIEW  Comparison: 10/28/2009  Findings: Lungs clear.  Heart size and pulmonary vascularity normal.  No effusion.  Visualized bones unremarkable.  IMPRESSION: No acute disease   Original Report Authenticated By: Osa Craver, M.D.    Based  on my review of the CTA NECK:  Aortic arch is not adequately evaluated by suggestion of possible atherosclerosis is raised.  Left ICA stenosis is <50%  Right ICA stenosis is >90% (near occluded)  Medical Decision Making  Samuel Berger is a 66 y.o. 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    I would suggest getting Neuroradiology's input on whether there is any benefit to a cerebral angiogram to better interrogate the segments of interest.  He does have a asx nearly occluded RICA that will need to be addressed.  In the setting of recent TIA vs CVA, it is not advisable to immediately proceed with CEA, as the normal hypotension as part of general anesthesia may be enough to cause a relative hypoperfusion of any recently ischemic brain tissue.  Cardiac preoperative risk stratification and optimization should be obtained in this patient with multiple co-morbidities  I discussed in depth with the patient the nature of atherosclerosis, and emphasized the importance of maximal medical management including strict control of blood pressure, blood glucose, and lipid levels, obtaining regular exercise, antiplatelet agents, and cessation of smoking.    The patient is aware that without maximal medical management the underlying atherosclerotic disease process will progress, limiting the benefit of any interventions.  Dr. Hart Rochester has been taking care of this patient, and we will check on the patient tomorrow  Thank you for allowing [place] to participate in this patient's care.  Leonides Sake, MD [facility] Office: [phone] Pager: [phone]  01/06/2012, 9:40 AM

## 2012-01-06 NOTE — Progress Notes (Signed)
Stroke Team Progress Note  HISTORY Samuel Berger is an 66 y.o. male with sudden onset right sided weakness at approximately 5:20 this afternoon. He was brought by EMS and on arrival was found to have severe right sided weakness.    SUBJECTIVE His wife and daughter is at the bedside. Overall he feels his condition is much improved, and he is at baseline. No real complaints. The patient is NPO by protocol.  OBJECTIVE Most recent Vital Signs: Temp: 97.5 F (36.4 C) (10/06 0800) BP: 135/61 mmHg (10/06 0800) Pulse Rate: 74  (10/06 0800) Respiratory Rate: 19 O2 Saturation: 94%  CBG (last 3)  Basename 01/05/12 2320 01/05/12 1802  GLUCAP 124* 114*   Intake/Output from previous day: 10/05 0701 - 10/06 0700 In: 489.2 [I.V.:489.2] Out: 1250 [Urine:1250]  IV Fluid Intake:     . sodium chloride 50 mL/hr at 01/06/12 0700  . DISCONTD: sodium chloride     Medications    .  stroke: mapping our early stages of recovery book   Does not apply Once  . alteplase  0.9 mg/kg (Order-Specific) Intravenous Once  . pantoprazole (PROTONIX) IV  40 mg Intravenous QHS  . DISCONTD: pantoprazole (PROTONIX) IV  40 mg Intravenous QHS  PRN acetaminophen, acetaminophen, iohexol, labetalol, ondansetron (ZOFRAN) IV, DISCONTD: acetaminophen, DISCONTD: acetaminophen, DISCONTD: labetalol, DISCONTD: ondansetron (ZOFRAN) IV  Diet:  NPO  Activity:  Bedrest  DVT Prophylaxis: SCD  Significant Diagnostic Studies: CBC    Component Value Date/Time   WBC 13.9* 01/05/2012 1756   RBC 5.07 01/05/2012 1756   HGB 15.6 01/05/2012 1803   HCT 46.0 01/05/2012 1803   PLT 206 01/05/2012 1756   MCV 86.2 01/05/2012 1756   MCH 31.0 01/05/2012 1756   MCHC 35.9 01/05/2012 1756   RDW 13.5 01/05/2012 1756   LYMPHSABS 4.6* 01/05/2012 1756   MONOABS 1.9* 01/05/2012 1756   EOSABS 0.8* 01/05/2012 1756   BASOSABS 0.1 01/05/2012 1756   CMP    Component Value Date/Time   NA 138 01/05/2012 1803   K 3.7 01/05/2012 1803   CL 103 01/05/2012  1803   CO2 23 01/05/2012 1756   GLUCOSE 121* 01/05/2012 1803   BUN 18 01/05/2012 1803   CREATININE 1.00 01/05/2012 1803   CALCIUM 9.8 01/05/2012 1756   PROT 7.1 01/05/2012 1756   ALBUMIN 4.1 01/05/2012 1756   AST 15 01/05/2012 1756   ALT 14 01/05/2012 1756   ALKPHOS 73 01/05/2012 1756   BILITOT 0.3 01/05/2012 1756   GFRNONAA 89* 01/05/2012 1756   GFRAA >90 01/05/2012 1756   COAGS Lab Results  Component Value Date   INR 0.95 01/05/2012   Lipid Panel    Component Value Date/Time   CHOL 140 01/06/2012 0431   TRIG 71 01/06/2012 0431   HDL 43 01/06/2012 0431   CHOLHDL 3.3 01/06/2012 0431   VLDL 14 01/06/2012 0431   LDLCALC 83 01/06/2012 0431   HgbA1C  No results found for this basename: HGBA1C   Urine Drug Screen    Component Value Date/Time   LABOPIA NONE DETECTED 01/05/2012 1902   COCAINSCRNUR NONE DETECTED 01/05/2012 1902   LABBENZ NONE DETECTED 01/05/2012 1902   AMPHETMU NONE DETECTED 01/05/2012 1902   THCU NONE DETECTED 01/05/2012 1902   LABBARB NONE DETECTED 01/05/2012 1902    Alcohol Level No results found for this basename: eth     Results for orders placed during the hospital encounter of 01/05/12 (from the past 24 hour(s))  PROTIME-INR     Status: Normal  Collection Time   01/05/12  5:56 PM      Component Value Range   Prothrombin Time 12.6  11.6 - 15.2 seconds   INR 0.95  0.00 - 1.49  APTT     Status: Normal   Collection Time   01/05/12  5:56 PM      Component Value Range   aPTT 27  24 - 37 seconds  CBC     Status: Abnormal   Collection Time   01/05/12  5:56 PM      Component Value Range   WBC 13.9 (*) 4.0 - 10.5 K/uL   RBC 5.07  4.22 - 5.81 MIL/uL   Hemoglobin 15.7  13.0 - 17.0 g/dL   HCT 40.9  81.1 - 91.4 %   MCV 86.2  78.0 - 100.0 fL   MCH 31.0  26.0 - 34.0 pg   MCHC 35.9  30.0 - 36.0 g/dL   RDW 78.2  95.6 - 21.3 %   Platelets 206  150 - 400 K/uL  DIFFERENTIAL     Status: Abnormal   Collection Time   01/05/12  5:56 PM      Component Value Range   Neutrophils  Relative 47  43 - 77 %   Neutro Abs 6.6  1.7 - 7.7 K/uL   Lymphocytes Relative 33  12 - 46 %   Lymphs Abs 4.6 (*) 0.7 - 4.0 K/uL   Monocytes Relative 14 (*) 3 - 12 %   Monocytes Absolute 1.9 (*) 0.1 - 1.0 K/uL   Eosinophils Relative 6 (*) 0 - 5 %   Eosinophils Absolute 0.8 (*) 0.0 - 0.7 K/uL   Basophils Relative 1  0 - 1 %   Basophils Absolute 0.1  0.0 - 0.1 K/uL  COMPREHENSIVE METABOLIC PANEL     Status: Abnormal   Collection Time   01/05/12  5:56 PM      Component Value Range   Sodium 135  135 - 145 mEq/L   Potassium 3.7  3.5 - 5.1 mEq/L   Chloride 99  96 - 112 mEq/L   CO2 23  19 - 32 mEq/L   Glucose, Bld 122 (*) 70 - 99 mg/dL   BUN 18  6 - 23 mg/dL   Creatinine, Ser 0.86  0.50 - 1.35 mg/dL   Calcium 9.8  8.4 - 57.8 mg/dL   Total Protein 7.1  6.0 - 8.3 g/dL   Albumin 4.1  3.5 - 5.2 g/dL   AST 15  0 - 37 U/L   ALT 14  0 - 53 U/L   Alkaline Phosphatase 73  39 - 117 U/L   Total Bilirubin 0.3  0.3 - 1.2 mg/dL   GFR calc non Af Amer 89 (*) >90 mL/min   GFR calc Af Amer >90  >90 mL/min  TROPONIN I     Status: Normal   Collection Time   01/05/12  5:57 PM      Component Value Range   Troponin I <0.30  <0.30 ng/mL  GLUCOSE, CAPILLARY     Status: Abnormal   Collection Time   01/05/12  6:02 PM      Component Value Range   Glucose-Capillary 114 (*) 70 - 99 mg/dL  POCT I-STAT TROPONIN I     Status: Normal   Collection Time   01/05/12  6:02 PM      Component Value Range   Troponin i, poc 0.00  0.00 - 0.08 ng/mL   Comment 3  POCT I-STAT, CHEM 8     Status: Abnormal   Collection Time   01/05/12  6:03 PM      Component Value Range   Sodium 138  135 - 145 mEq/L   Potassium 3.7  3.5 - 5.1 mEq/L   Chloride 103  96 - 112 mEq/L   BUN 18  6 - 23 mg/dL   Creatinine, Ser 4.54  0.50 - 1.35 mg/dL   Glucose, Bld 098 (*) 70 - 99 mg/dL   Calcium, Ion 1.19  1.47 - 1.30 mmol/L   TCO2 24  0 - 100 mmol/L   Hemoglobin 15.6  13.0 - 17.0 g/dL   HCT 82.9  56.2 - 13.0 %  URINALYSIS,  ROUTINE W REFLEX MICROSCOPIC     Status: Abnormal   Collection Time   01/05/12  7:01 PM      Component Value Range   Color, Urine YELLOW  YELLOW   APPearance CLEAR  CLEAR   Specific Gravity, Urine 1.029  1.005 - 1.030   pH 5.0  5.0 - 8.0   Glucose, UA NEGATIVE  NEGATIVE mg/dL   Hgb urine dipstick NEGATIVE  NEGATIVE   Bilirubin Urine SMALL (*) NEGATIVE   Ketones, ur NEGATIVE  NEGATIVE mg/dL   Protein, ur NEGATIVE  NEGATIVE mg/dL   Urobilinogen, UA 1.0  0.0 - 1.0 mg/dL   Nitrite NEGATIVE  NEGATIVE   Leukocytes, UA NEGATIVE  NEGATIVE  URINE RAPID DRUG SCREEN (HOSP PERFORMED)     Status: Normal   Collection Time   01/05/12  7:02 PM      Component Value Range   Opiates NONE DETECTED  NONE DETECTED   Cocaine NONE DETECTED  NONE DETECTED   Benzodiazepines NONE DETECTED  NONE DETECTED   Amphetamines NONE DETECTED  NONE DETECTED   Tetrahydrocannabinol NONE DETECTED  NONE DETECTED   Barbiturates NONE DETECTED  NONE DETECTED  MRSA PCR SCREENING     Status: Normal   Collection Time   01/05/12  9:03 PM      Component Value Range   MRSA by PCR NEGATIVE  NEGATIVE  GLUCOSE, CAPILLARY     Status: Abnormal   Collection Time   01/05/12 11:20 PM      Component Value Range   Glucose-Capillary 124 (*) 70 - 99 mg/dL  LIPID PANEL     Status: Normal   Collection Time   01/06/12  4:31 AM      Component Value Range   Cholesterol 140  0 - 200 mg/dL   Triglycerides 71  <865 mg/dL   HDL 43  >78 mg/dL   Total CHOL/HDL Ratio 3.3     VLDL 14  0 - 40 mg/dL   LDL Cholesterol 83  0 - 99 mg/dL    Ct Angio Head W/cm &/or Wo Cm  01/05/2012  *RADIOLOGY REPORT*  Clinical Data:  Code stroke.  Left MCA territory.  CT ANGIOGRAPHY HEAD AND NECK  Technique:  Multidetector CT imaging of the head and neck was performed using the standard protocol during bolus administration of intravenous contrast.  Multiplanar CT image reconstructions including MIPs were obtained to evaluate the vascular anatomy. Carotid stenosis  measurements (when applicable) are obtained utilizing NASCET criteria, using the distal internal carotid diameter as the denominator.  Contrast: 50mL OMNIPAQUE IOHEXOL 350 MG/ML SOLN  Comparison:  Head CT same day  CTA NECK  Findings:  Lung apices are clear except for mild scarring.  No superior mediastinal pathology.  There is atherosclerosis of the  aorta.  Branching pattern of brachiocephalic vessels is normal.  No origin stenoses.  The right common carotid artery is widely patent to the bifurcation.  There is severe stenosis of the proximal ICA with a string sign.  This consistent with 95% or greater stenosis.  Beyond that, the cervical internal carotid artery shows flow but is markedly diminished in caliber because of the reduced inflow.  The left common carotid arteries widely patent to the bifurcation. There is atherosclerotic disease at the carotid bifurcation. Minimal diameter of the proximal internal carotid artery is 4 mm. Compared to a more distal cervical ICA diameter of 5 mm, this indicates a 20% stenosis.  There is atherosclerosis of the right vertebral artery origin. This is a small vessel.  Therefore, the stenosis is difficult to accurately measure.  I estimate this at 50%.  The left vertebral artery is also a small vessel.  There does not appear to be any stenosis at its origin.  Both vertebral arteries, though a small, are patent through the cervical region to the foramen magnum.  The soft tissues of the neck are unremarkable except for the thyroid nodule the lower pole on the left measuring 2 cm in diameter.  Ultrasound would be suggested to evaluate this if clinically appropriate.   Review of the MIP images confirms the above findings.  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  Beyond that, the vessel is markedly narrowed because of diminished inflow.  20% stenosis of the proximal left internal carotid artery.  Both vertebral arteries are diminutive  vessels but are patent through the neck.  I estimate there is a 50% stenosis of the proximal right vertebral artery.  CTA HEAD  Findings:  Both internal carotid arteries are patent through the skull base, with the right being a diminutive vessel because of reduced inflow.  There is extensive atherosclerotic calcification in both carotid siphon regions.  Stenosis in that region is estimated at 50-70% bilaterally.  The right anterior and middle cerebral vessels are patent without proximal stenosis, aneurysm or vascular malformation.  The left anterior and middle cerebral vessels are patent.  The M1 segment shows pronounced irregularity.  Stenosis at the M1 origin is estimated at 50%.  I do not see an intraluminal filling defect at this moment however.  No missing distal branches are appreciated.  The right vertebral artery is a small vessel that appears to be patent to the basilar.  The left vertebral artery is a small vessel patent to the foramen magnum but without demonstrable flow beyond that.  There is reconstitution or retrograde flow within the distal left vertebral.  The basilar artery is a small vessel but does show flow.  Both posterior cerebral arteries receive there supply from the anterior circulation.  The brain shows old small vessel infarctions within the deep white matter.  There is no identifiable acute infarction.  No intracranial hemorrhage.   Review of the MIP images confirms the above findings.  IMPRESSION: The right internal carotid artery is diminutive because of reduced inflow.  Both internal carotid arteries are patent through the siphon region.  There is extensive calcific atherosclerosis of both carotid siphons with stenoses on the order of 50-70% bilaterally.  Both anterior and middle cerebral vessels are patent.  No evidence of intraluminal filling defect.  There is irregularity of the M1 segment on the left with stenosis of the left M1 segment of 50%. No missing distal branch vessels are  identified.  Both vertebral arteries are diminutive.  The right vertebral artery is patent to the basilar.  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  There is reconstituted or retrograde flow in the distal left vertebral artery.  The basilar artery is a very diminutive vessel but does show flow.  This vessel supplies both superior cerebellar arteries.  Both posterior cerebral arteries receive most to their supply from the anterior circulation.   Original Report Authenticated By: Thomasenia Sales, M.D.    Ct Head Wo Contrast  01/05/2012  *RADIOLOGY REPORT*  Clinical Data: Code stroke.  Right-sided weakness.  Right facial droop.  CT HEAD WITHOUT CONTRAST  Technique:  Contiguous axial images were obtained from the base of the skull through the vertex without contrast.  Comparison: 06/23/2008  Findings: The cerebellum, brain stem, cerebral peduncles, and thalami appear normal.  A small remote lacunar infarct involving the anterior limb of the left internal capsule and head of the left caudate nucleus is observed.  There is also a small lacunar infarct in the head of the right caudate nucleus appears old.  The basal ganglia appear otherwise unremarkable.  Ventricular system and basilar cisterns unremarkable.  Mild chronic left maxillary sinusitis noted. No intracranial hemorrhage, mass lesion, or acute infarction is identified.  IMPRESSION:  1.  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 No acute intracranial findings. 2.  Mild chronic left maxillary sinusitis.  I discussed these findings by telephone with Dr. Viviann Spare Rancour at 6:11 p.m. on 01/05/2012.   Original Report Authenticated By: Dellia Cloud, M.D.    Ct Angio Neck W/cm &/or Wo/cm  01/05/2012  *RADIOLOGY REPORT*  Clinical Data:  Code stroke.  Left MCA territory.  CT ANGIOGRAPHY HEAD AND NECK  Technique:  Multidetector CT imaging of the head  and neck was performed using the standard protocol during bolus administration of intravenous contrast.  Multiplanar CT image reconstructions including MIPs were obtained to evaluate the vascular anatomy. Carotid stenosis measurements (when applicable) are obtained utilizing NASCET criteria, using the distal internal carotid diameter as the denominator.  Contrast: 50mL OMNIPAQUE IOHEXOL 350 MG/ML SOLN  Comparison:  Head CT same day  CTA NECK  Findings:  Lung apices are clear except for mild scarring.  No superior mediastinal pathology.  There is atherosclerosis of the aorta.  Branching pattern of brachiocephalic vessels is normal.  No origin stenoses.  The right common carotid artery is widely patent to the bifurcation.  There is severe stenosis of the proximal ICA with a string sign.  This consistent with 95% or greater stenosis.  Beyond that, the cervical internal carotid artery shows flow but is markedly diminished in caliber because of the reduced inflow.  The left common carotid arteries widely patent to the bifurcation. There is atherosclerotic disease at the carotid bifurcation. Minimal diameter of the proximal internal carotid artery is 4 mm. Compared to a more distal cervical ICA diameter of 5 mm, this indicates a 20% stenosis.  There is atherosclerosis of the right vertebral artery origin. This is a small vessel.  Therefore, the stenosis is difficult to accurately measure.  I estimate this at 50%.  The left vertebral artery is also a small vessel.  There does not appear to be any stenosis at its origin.  Both vertebral arteries, though a small, are patent through the cervical region to the foramen magnum.  The soft tissues of the neck are unremarkable  except for the thyroid nodule the lower pole on the left measuring 2 cm in diameter.  Ultrasound would be suggested to evaluate this if clinically appropriate.   Review of the MIP images confirms the above findings.  IMPRESSION:  95% or greater stenosis of  the right internal carotid artery proximally.  Beyond that, the vessel is markedly narrowed because of diminished inflow.  20% stenosis of the proximal left internal carotid artery.  Both vertebral arteries are diminutive vessels but are patent through the neck.  I estimate there is a 50% stenosis of the proximal right vertebral artery.  CTA HEAD  Findings:  Both internal carotid arteries are patent through the skull base, with the right being a diminutive vessel because of reduced inflow.  There is extensive atherosclerotic calcification in both carotid siphon regions.  Stenosis in that region is estimated at 50-70% bilaterally.  The right anterior and middle cerebral vessels are patent without proximal stenosis, aneurysm or vascular malformation.  The left anterior and middle cerebral vessels are patent.  The M1 segment shows pronounced irregularity.  Stenosis at the M1 origin is estimated at 50%.  I do not see an intraluminal filling defect at this moment however.  No missing distal branches are appreciated.  The right vertebral artery is a small vessel that appears to be patent to the basilar.  The left vertebral artery is a small vessel patent to the foramen magnum but without demonstrable flow beyond that.  There is reconstitution or retrograde flow within the distal left vertebral.  The basilar artery is a small vessel but does show flow.  Both posterior cerebral arteries receive there supply from the anterior circulation.  The brain shows old small vessel infarctions within the deep white matter.  There is no identifiable acute infarction.  No intracranial hemorrhage.   Review of the MIP images confirms the above findings.  IMPRESSION: The right internal carotid artery is diminutive because of reduced inflow.  Both internal carotid arteries are patent through the siphon region.  There is extensive calcific atherosclerosis of both carotid siphons with stenoses on the order of 50-70% bilaterally.  Both  anterior and middle cerebral vessels are patent.  No evidence of intraluminal filling defect.  There is irregularity of the M1 segment on the left with stenosis of the left M1 segment of 50%. No missing distal branch vessels are identified.  Both vertebral arteries are diminutive.  The right vertebral artery is patent to the basilar.  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  There is reconstituted or retrograde flow in the distal left vertebral artery.  The basilar artery is a very diminutive vessel but does show flow.  This vessel supplies both superior cerebellar arteries.  Both posterior cerebral arteries receive most to their supply from the anterior circulation.   Original Report Authenticated By: Thomasenia Sales, M.D.    Dg Chest Port 1 View  01/05/2012   *RADIOLOGY REPORT*  Clinical Data: Hypertension, possible aspiration  PORTABLE CHEST - 1 VIEW  Comparison: 10/28/2009  Findings: Lungs clear.  Heart size and pulmonary vascularity normal.  No effusion.  Visualized bones unremarkable.  IMPRESSION: No acute disease   Original Report Authenticated By: Osa Craver, M.D.     CT of the brain   IMPRESSION:  1. Bilateral old lacunar infarcts involving the head of the  caudate nuclei and adjacent anterior limbs of the internal capsule.  No acute intracranial findings.  2. Mild chronic left  maxillary sinusitis.   CT angio   IMPRESSION:  95% or greater stenosis of the right internal carotid artery  proximally. Beyond that, the vessel is markedly narrowed because  of diminished inflow.  20% stenosis of the proximal left internal carotid artery.  Both vertebral arteries are diminutive vessels but are patent  through the neck. I estimate there is a 50% stenosis of the  proximal right vertebral artery.   IMPRESSION:  The right internal carotid artery is diminutive because of reduced  inflow. Both internal carotid arteries are patent through the  siphon  region. There is extensive calcific atherosclerosis of both  carotid siphons with stenoses on the order of 50-70% bilaterally.  Both anterior and middle cerebral vessels are patent. No evidence  of intraluminal filling defect. There is irregularity of the M1  segment on the left with stenosis of the left M1 segment of 50%.  No missing distal branch vessels are identified.  Both vertebral arteries are diminutive. The right vertebral artery  is patent to the basilar. The left vertebral artery is occluded  just beyond the foramen magnum. There is reconstituted or  retrograde flow in the distal left vertebral artery. The basilar  artery is a very diminutive vessel but does show flow. This vessel  supplies both superior cerebellar arteries. Both posterior  cerebral arteries receive most to their supply from the anterior  circulation.    MRI of the brain  Pending  MRA of the brain  Not ordered  2D Echocardiogram  Pending  Carotid Doppler  Not ordered  CXR   IMPRESSION:  No acute disease   EKG   SINUS TACHYCARDIA ~ V-rate> 99 RIGHT ATRIAL ABNORMALITY ~ P>0.34mV 2 lds or<-0.58mV aVR/aVL CONSIDER RIGHT VENTRICULAR HYPERTROPHY ~ large R or R' V1/V2 PROBABLE ANTEROLATERAL INFARCT, OLD ~ Q >26mS, abnormal ST-T, V2-V6  Physical Exam   The patient is alert and cooperative.  Respiratory examination is clear bilaterally.  Cardiovascular examination reveals a regular rate and rhythm, no obvious murmurs or rubs are noted.  Abdomen reveals positive bowel sounds, no organomegaly or tenderness is noted.  Extremities are without significant edema.   Neurologic exam reveals full extraocular movements, speech is normal. Visual fields are full.  Motor testing reveals good strength of all four extremities.  The patient has good finger-nose-finger and heel-to-shin bilaterally. Gait was not tested.  Deep tendon reflexes are symmetric and normal. Toes are down going  bilaterally.    ASSESSMENT Mr. Adren Bissonnette is a 66 y.o. male with a left brain clinical TIA. On aspirin 81 mg orally every day for secondary stroke prevention.  Stroke risk factors:  diabetes mellitus, hyperlipidemia and hypertension.  Hospital day # 1  TREATMENT/PLAN  Paola Burkins is a 66 year old gentleman with a history of hypertension, dyslipidemia, and diabetes. Patient has a history of coronary artery disease. This gentleman sustained some episodes of dizziness within the last 2 weeks, and he had at least one episode of transient slurred speech lasting less than 30 seconds. The patient presents with right-sided symptoms of weakness mainly affecting the right arm. The patient received TPA, and he has essentially cleared all deficits. The patient is clinically had a TIA event. 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 Some disease of the left and middle cerebral arteries noted with 50% stenosis. Stroke workup is underway.  -MRI of the brain -2-D echocardiogram -The patient is n.p.o., speech therapy is to see -Aspirin therapy after 24 hours post TPA -Occupational  and physical therapy is to evaluate, the patient likely will not need much in the way of therapy. -Vascular surgery has been contacted, the patient likely will need a carotid endarterectomy which could be done during this admission  -Clinical and neurologic course will be followed during this admission    Lesly Dukes

## 2012-01-06 NOTE — Progress Notes (Signed)
SLP  Note  Patient Details Name: Kingdom Dobrin MRN: [id number] DOB: 1945-09-10  Cognitive Linguistic screening completed.  Cognitive Linguistic skills baseline with no deficits noted.  ST to sign off.            Moreen Fowler MS, CCC-SLP [phone] [facility] 01/06/2012, 10:39 AM

## 2012-01-06 NOTE — Evaluation (Signed)
Clinical/Bedside Swallow Evaluation Patient Details  Name: Samuel Berger MRN: [id number] Date of Birth: 03/03/1946  Today's Date: 01/06/2012 Time: 4098-1191 SLP Time Calculation (min): 30 min  Past Medical History:  Past Medical History  Diagnosis Date  . Hypertension   . Hyperlipidemia   . Stroke   . Iliac artery aneurysm, right   . Diabetes mellitus   . Kidney stone   . CAD (coronary artery disease)    Past Surgical History: History reviewed. No pertinent past surgical history. HPI:  67 y/o male who presented to ED with acute onset of right sided weakness and dysarthria.  Patient given tPA in ED.  CT indicates bilateral old lacunar infarcts involving the head of the caudate nuclei and adjacent anterior limbs of the internal capsule.  No acute intracranial findings . Patient referred for BSE per stroke protocol.     Assessment / Plan / Recommendation Clinical Impression  Oropharyngeal swallow functional for regular consistency and thin liquids .  No s/s of aspiration noted throughout evaluation.  Recommend regular consistency/thin liquids .  ST to sign off as patient's swallow baseline. Education complete.     Aspiration Risk  None    Diet Recommendation Regular;Thin liquid   Liquid Administration via: Cup;Straw Medication Administration: Whole meds with liquid Supervision: Patient able to self feed    Other  Recommendations Oral Care Recommendations: Oral care BID   Follow Up Recommendations    No follow up required             Swallow Study Prior Functional Status  Lived at home    General Date of Onset: 01/06/12 HPI: 66 y/o male who presented to ED with acute onset of right sided weakness and dysarthria.  Patient given tPA in ED.  CT indicates bilateral old lacunar infarcts involving the head of the caudate nuclei and adjacent anterior limbs of the internal capsule.  No acute intracranial findings  Type of Study: Bedside swallow evaluation Previous Swallow  Assessment: no prior evaluations Diet Prior to this Study: NPO Temperature Spikes Noted: No Respiratory Status: Room air History of Recent Intubation: No Behavior/Cognition: Alert;Cooperative;Pleasant mood Oral Cavity - Dentition: Adequate natural dentition Self-Feeding Abilities: Able to feed self Patient Positioning: Upright in bed Baseline Vocal Quality: Clear Volitional Cough: Strong Volitional Swallow: Able to elicit    Oral/Motor/Sensory Function Overall Oral Motor/Sensory Function: Appears within functional limits for tasks assessed   Ice Chips Ice chips: Within functional limits   Thin Liquid Thin Liquid: Within functional limits    Nectar Thick Nectar Thick Liquid: Not tested   Honey Thick Honey Thick Liquid: Not tested   Puree Puree: Within functional limits Presentation: Self Fed   Solid   GO    Solid: Within functional limits Presentation: Self Fed      Moreen Fowler MS, CCC-SLP [phone] [facility] 01/06/2012,10:30 AM

## 2012-01-06 NOTE — Progress Notes (Signed)
PT/OT Cancellation Note  Patient Details Name: Samuel Berger MRN: [id number] DOB: 29-Jul-1945   Cancelled Treatment:    Reason Eval/Treat Not Completed: Medical issues which prohibited therapy (strict bedrest). Will re-attempt pending updated activity orders.   01/06/2012 Cipriano Mile OTR/L Pager [phone] Office [phone]

## 2012-01-07 ENCOUNTER — Telehealth: Payer: Self-pay | Admitting: Vascular Surgery

## 2012-01-07 ENCOUNTER — Other Ambulatory Visit: Payer: Self-pay | Admitting: *Deleted

## 2012-01-07 DIAGNOSIS — I6529 Occlusion and stenosis of unspecified carotid artery: Secondary | ICD-10-CM

## 2012-01-07 DIAGNOSIS — I63239 Cerebral infarction due to unspecified occlusion or stenosis of unspecified carotid arteries: Secondary | ICD-10-CM | POA: Diagnosis not present

## 2012-01-07 DIAGNOSIS — I6350 Cerebral infarction due to unspecified occlusion or stenosis of unspecified cerebral artery: Secondary | ICD-10-CM | POA: Diagnosis not present

## 2012-01-07 LAB — CBC WITH DIFFERENTIAL/PLATELET
Hemoglobin: 16.1 g/dL (ref 13.0–17.0)
Lymphocytes Relative: 23 % (ref 12–46)
Lymphs Abs: 2.5 10*3/uL (ref 0.7–4.0)
MCV: 87.9 fL (ref 78.0–100.0)
Monocytes Relative: 15 % — ABNORMAL HIGH (ref 3–12)
Neutrophils Relative %: 55 % (ref 43–77)
Platelets: 198 10*3/uL (ref 150–400)
RBC: 5.22 MIL/uL (ref 4.22–5.81)
WBC: 11 10*3/uL — ABNORMAL HIGH (ref 4.0–10.5)

## 2012-01-07 LAB — COMPREHENSIVE METABOLIC PANEL
ALT: 11 U/L (ref 0–53)
Alkaline Phosphatase: 72 U/L (ref 39–117)
CO2: 26 mEq/L (ref 19–32)
GFR calc Af Amer: >90 mL/min (ref >90)
GFR calc non Af Amer: 84 mL/min — ABNORMAL LOW (ref >90)
Glucose, Bld: 130 mg/dL — ABNORMAL HIGH (ref 70–99)
Potassium: 4.2 mEq/L (ref 3.5–5.1)
Sodium: 139 mEq/L (ref 135–145)
Total Bilirubin: 0.4 mg/dL (ref 0.3–1.2)

## 2012-01-07 LAB — GLUCOSE, CAPILLARY: Glucose-Capillary: 130 mg/dL — ABNORMAL HIGH (ref 70–99)

## 2012-01-07 MED ORDER — CLOPIDOGREL BISULFATE 75 MG PO TABS
75.0000 mg | ORAL_TABLET | Freq: Every day | ORAL | Status: DC
  Administered 2012-01-07 – 2012-01-08 (×2): 75 mg via ORAL
  Filled 2012-01-07 (×3): qty 1

## 2012-01-07 NOTE — Progress Notes (Signed)
Physical Therapy Evaluation and Discharge Patient Details Name: Donel Osowski MRN: [id number] DOB: May 07, 1945 Today's Date: 01/07/2012 Time: 4098-1191 PT Time Calculation (min): 24 min  PT Assessment / Plan / Recommendation Clinical Impression  Pt is 66 yo male with acute CVA who received tPA and has returned to baseline though he does admit to some mild memory issues today.  Pt's strength and balance are WFL, does not need acute or f/u PT.    PT Assessment  Patent does not need any further PT services    Follow Up Recommendations  No PT follow up    Does the patient have the potential to tolerate intense rehabilitation      Barriers to Discharge        Equipment Recommendations  None recommended by PT;None recommended by OT    Recommendations for Other Services     Frequency      Precautions / Restrictions Precautions Precautions: None Restrictions Weight Bearing Restrictions: No   Pertinent Vitals/Pain No c/o pain      Mobility  Bed Mobility Bed Mobility: Supine to Sit Supine to Sit: 7: Independent;HOB flat Transfers Transfers: Sit to Stand;Stand to Sit Sit to Stand: 7: Independent;From bed Stand to Sit: 7: Independent;To bed Ambulation/Gait Ambulation/Gait Assistance: 7: Independent Ambulation Distance (Feet): 400 Feet Assistive device: None Ambulation/Gait Assistance Details: normal gait and speed Gait Pattern: Within Functional Limits Gait velocity: WFL Stairs: No Wheelchair Mobility Wheelchair Mobility: No Modified Rankin (Stroke Patients Only) Pre-Morbid Rankin Score: No symptoms Modified Rankin: No symptoms    Shoulder Instructions     Exercises     PT Diagnosis:    PT Problem List:   PT Treatment Interventions:     PT Goals    Visit Information  Last PT Received On: 01/07/12 Assistance Needed: +1    Subjective Data  Subjective: I feel back to myself but I have noticed forgetting some things today Patient Stated Goal: return to home  and golf   Prior Functioning  Home Living Lives With: Spouse Available Help at Discharge: Available PRN/intermittently Type of Home: House Home Access: Stairs to enter [employer] of Steps: 2 Entrance Stairs-Rails: None Home Layout: Multi-level;Able to live on main level with bedroom/bathroom Bathroom Shower/Tub: Walk-in shower;Door [employer] Toilet: Handicapped height Bathroom Accessibility: Yes How Accessible: Accessible via walker Home Adaptive Equipment: Built-in shower seat Prior Function Level of Independence: Independent Able to Take Stairs?: Reciprically Driving: Yes Vocation: Retired Musician: No difficulties Dominant Hand: Right    Cognition  Overall Cognitive Status: Appears within functional limits for tasks assessed/performed Arousal/Alertness: Awake/alert Orientation Level: Appears intact for tasks assessed Behavior During Session: [facility] for tasks performed    Extremity/Trunk Assessment Right Upper Extremity Assessment RUE ROM/Strength/Tone: Within functional levels Left Upper Extremity Assessment LUE ROM/Strength/Tone: Within functional levels Right Lower Extremity Assessment RLE ROM/Strength/Tone: Within functional levels RLE Sensation: WFL - Light Touch;WFL - Proprioception RLE Coordination: WFL - gross motor Left Lower Extremity Assessment LLE ROM/Strength/Tone: Within functional levels LLE Sensation: WFL - Light Touch;WFL - Proprioception LLE Coordination: WFL - gross motor Trunk Assessment Trunk Assessment: Normal   Balance Balance Balance Assessed: Yes Standardized Balance Assessment Standardized Balance Assessment: Berg Balance Test;Dynamic Gait Index Berg Balance Test Sit to Stand: Able to stand without using hands and stabilize independently Standing Unsupported: Able to stand safely 2 minutes Sitting with Back Unsupported but Feet Supported on Floor or Stool: Able to sit safely and securely 2 minutes Stand to  Sit: Sits safely with minimal use of hands  Transfers: Able to transfer safely, minor use of hands Standing Unsupported with Eyes Closed: Able to stand 10 seconds safely Standing Ubsupported with Feet Together: Able to place feet together independently and stand 1 minute safely From Standing, Reach Forward with Outstretched Arm: Can reach confidently >25 cm (10") From Standing Position, Pick up Object from Floor: Able to pick up shoe safely and easily From Standing Position, Turn to Look Behind Over each Shoulder: Looks behind from both sides and weight shifts well Turn 360 Degrees: Able to turn 360 degrees safely in 4 seconds or less Standing Unsupported, Alternately Place Feet on Step/Stool: Able to stand independently and safely and complete 8 steps in 20 seconds Standing Unsupported, One Foot in Front: Able to place foot tandem independently and hold 30 seconds Standing on One Leg: Able to lift leg independently and hold > 10 seconds Total Score: 56  Dynamic Gait Index Level Surface: Normal Change in Gait Speed: Normal Gait with Horizontal Head Turns: Normal Gait with Vertical Head Turns: Normal Gait and Pivot Turn: Normal Step Around Obstacles: Normal  End of Session PT - End of Session Activity Tolerance: Patient tolerated treatment well Patient left: with call bell/phone within reach Nurse Communication: Mobility status  GP   Lyanne Co, PT  Acute Rehab Services  [phone]   Lyanne Co 01/07/2012, 4:03 PM

## 2012-01-07 NOTE — Telephone Encounter (Signed)
Message copied by Margaretmary Eddy on Mon Jan 07, 2012 11:25 AM ------      Message from: Melene Plan      Created: Mon Jan 07, 2012 10:11 AM       Per Dr Hart [place] this pt is in the hospital with a (L)brain stroke. He needs a 4 week followup with carotid duplex.      Thanks      Darel Hong

## 2012-01-07 NOTE — Progress Notes (Signed)
Pt transferred by wheelchair to [facility] on [address], VSS by Nurse Tech. Transfer orders in chart, reviewed by RNs. Report given to [place], RN prior to transfer. Pt has all pt belongings with him (cell phone, clothes, shoes, glasses) and bathing supplies. Attempted to call pt's wife Thurston Hole), no answer.   Delynn Flavin, RN, BSN

## 2012-01-07 NOTE — Progress Notes (Signed)
Stroke Team Progress Note  HISTORY  Lenzy Kerschner is an 66 y.o. male with sudden onset right sided weakness at approximately 5:20 this afternoon. He was brought by EMS and on arrival was found to have severe right sided weakness.   LSN: 17:20  tPA Given: Yes, needle time 1818 per RR  SUBJECTIVE Sitting up in bed.  Overall he feels his condition is rapidly improving.   OBJECTIVE Most recent Vital Signs: Filed Vitals:   01/07/12 0200 01/07/12 0400 01/07/12 0600 01/07/12 0715  BP: 127/48 109/58 131/73   Pulse: 73 65 66   Temp:  97.8 F (36.6 C)  97.8 F (36.6 C)  TempSrc:  Oral  Oral  Resp: 17 15 15    Height:      Weight:   85.7 kg (188 lb 15 oz)   SpO2: 95% 94% 94%    CBG (last 3)   Basename 01/06/12 1655 01/06/12 1122 01/06/12 0726  GLUCAP 140* 246* 118*   Intake/Output from previous day: 10/06 0701 - 10/07 0700 In: 470 [P.O.:120; I.V.:350] Out: 1425 [Urine:1425]  IV Fluid Intake:     . DISCONTD: sodium chloride Stopped (01/06/12 1400)    MEDICATIONS    .  stroke: mapping our early stages of recovery book   Does not apply Once  . aspirin  325 mg Oral Daily  . insulin aspart  0-9 Units Subcutaneous TID WC  . pantoprazole  40 mg Oral Q1200  . DISCONTD: pantoprazole (PROTONIX) IV  40 mg Intravenous QHS   PRN:  acetaminophen, acetaminophen, labetalol, ondansetron (ZOFRAN) IV  Diet:  Cardiac thin liquids Activity:  Bedrest DVT Prophylaxis:  SCD  CLINICALLY SIGNIFICANT STUDIES Basic Metabolic Panel:  Lab 01/07/12 1191 01/05/12 1803 01/05/12 1756  NA 139 138 --  K 4.2 3.7 --  CL 104 103 --  CO2 26 -- 23  GLUCOSE 130* 121* --  BUN 15 18 --  CREATININE 0.95 1.00 --  CALCIUM 9.7 -- 9.8  MG -- -- --  PHOS -- -- --   Liver Function Tests:  Lab 01/07/12 0439 01/05/12 1756  AST 12 15  ALT 11 14  ALKPHOS 72 73  BILITOT 0.4 0.3  PROT 6.7 7.1  ALBUMIN 3.6 4.1   CBC:  Lab 01/07/12 0439 01/05/12 1803 01/05/12 1756  WBC 11.0* -- 13.9*  NEUTROABS 6.0 -- 6.6    HGB 16.1 15.6 --  HCT 45.9 46.0 --  MCV 87.9 -- 86.2  PLT 198 -- 206   Coagulation:  Lab 01/05/12 1756  LABPROT 12.6  INR 0.95   Cardiac Enzymes:  Lab 01/05/12 1757  CKTOTAL --  CKMB --  CKMBINDEX --  TROPONINI <0.30   Urinalysis:  Lab 01/05/12 1901  COLORURINE YELLOW  LABSPEC 1.029  PHURINE 5.0  GLUCOSEU NEGATIVE  HGBUR NEGATIVE  BILIRUBINUR SMALL*  KETONESUR NEGATIVE  PROTEINUR NEGATIVE  UROBILINOGEN 1.0  NITRITE NEGATIVE  LEUKOCYTESUR NEGATIVE   Lipid Panel    Component Value Date/Time   CHOL 140 01/06/2012 0431   TRIG 71 01/06/2012 0431   HDL 43 01/06/2012 0431   CHOLHDL 3.3 01/06/2012 0431   VLDL 14 01/06/2012 0431   LDLCALC 83 01/06/2012 0431   HgbA1C  Lab Results  Component Value Date   HGBA1C 6.7* 01/06/2012    Urine Drug Screen:     Component Value Date/Time   LABOPIA NONE DETECTED 01/05/2012 1902   COCAINSCRNUR NONE DETECTED 01/05/2012 1902   LABBENZ NONE DETECTED 01/05/2012 1902   AMPHETMU NONE DETECTED 01/05/2012 1902  THCU NONE DETECTED 01/05/2012 1902   LABBARB NONE DETECTED 01/05/2012 1902    Alcohol Level: No results found for this basename: ETH:2 in the last 168 hours  Ct Angio Head W/cm &/or Wo Cm 01/05/2012  95% or greater stenosis of the right internal carotid artery proximally.  Beyond that, the vessel is markedly narrowed because of diminished inflow.  20% stenosis of the proximal left internal carotid artery.  Both vertebral arteries are diminutive vessels but are patent through the neck.  I estimate there is a 50% stenosis of the proximal right vertebral artery.  The right internal carotid artery is diminutive because of reduced inflow.  Both internal carotid arteries are patent through the siphon region.  There is extensive calcific atherosclerosis of both carotid siphons with stenoses on the order of 50-70% bilaterally.  Both anterior and middle cerebral vessels are patent.  No evidence of intraluminal filling defect.  There is irregularity of  the M1 segment on the left with stenosis of the left M1 segment of 50%. No missing distal branch vessels are identified.  Both vertebral arteries are diminutive.  The right vertebral artery is patent to the basilar.  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  There is reconstituted or retrograde flow in the distal left vertebral artery.  The basilar artery is a very diminutive vessel but does show flow.  This vessel supplies both superior cerebellar arteries.  Both posterior cerebral arteries receive most to their supply from the anterior circulation.     Ct Head Wo Contrast 01/05/2012 Bilateral old lacunar infarcts involving the head of the caudate nuclei and adjacent anterior limbs of the internal capsule. No acute intracranial findings. Mild chronic left maxillary sinusitis.    Mr Brain Wo Contrast Cluster of sub-centimeter acute infarctions within the periphery of the lower to mid left cerebellar hemisphere.  Several old small vessel cerebellar infarctions, several old lacunar infarctions within the basal ganglia and moderate chronic small vessel changes in the hemispheric white matter  Dg Chest Port 1 View  No acute disease    2D Echocardiogram  - Left ventricle: Technically difficult, but no definite wall abnormalities. The cavity size was normal. Wall thickness was increased in a pattern of mild LVH. The estimated ejection fraction was 60%. Wall motion was normal; there were no regional wall motion abnormalities. - Impressions: No cardiac source of embolism was identified,but cannot be ruled out on the basis of this examination. - No cardiac source of embolism was identified, but cannot be ruled out on the basis of this examination  EKG  sinus tachycardia.   Therapy Recommendations PT -; OT - ; ST -   Physical Exam  Pleasant elderly male not in distress.Awake alert. Afebrile. Head is nontraumatic. Neck is supple without bruit. Hearing is normal. Cardiac exam no  murmur or gallop. Lungs are clear to auscultation. Distal pulses are well felt.  Neurological Exam : Awake alert oriented x 3 normal speech and language.fundi were not visualized. Vision acuity and fields appear normal. Mild left lower face asymmetry. Tongue midline. No drift. Mild diminished fine finger movements on left. Orbits right over left upper extremity. Mild left grip weak.. Normal sensation . Normal coordination.  ASSESSMENT Mr. Damare Serano is a 66 y.o. male presenting with right hemiparesis. Status post IV t-PA  at 1818 on 01/05/12. LKN 1720 same day. Imaging confirms an acute left cerebellar hemispheric infarct. Infarct felt to be embolic secondary to cardiac source; however, work up underway.  On aspirin 81 mg orally every day prior to admission. Now on aspirin 325 mg orally every day for secondary stroke prevention. Patient with resultant right hemiparesis.  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 Hart Rochester, who recommends waiting approximately 4 weeks before proceeding with R CEA. He will see in office prior to that time.   Hospital day # 2  TREATMENT/PLAN  Continue clopidogrel 75 mg orally every day for secondary stroke prevention. TEE to look for embolic source. Please arrange with pts cardiologist or cardiologist of choice. Will need to be NPO after midnight. If positive for PFO (patent foramen ovale), check bilateral lower extremity venous dopplers to rule out DVT as possible source of stroke.  Outpatient telemetry monitoring to assess patient for atrial fibrillation as source of stroke. Risk factor Management: LDL < 70, Hgb A1C < 7.0 Aggressive therapy Outpatient followup with vascular surgeons for R ICA stenosis  Guy Franco, [facility],  MBA, MHA [facility] Pager: [phone] 01/07/2012 7:40 AM  Scribe for Dr.  Delia Heady, Stroke Center Medical Director. He has personally reviewed chart, pertinent data, examined the patient and developed the plan of care. Pager:  [phone]

## 2012-01-07 NOTE — Progress Notes (Signed)
UR COMPLETED  

## 2012-01-07 NOTE — Progress Notes (Signed)
Patient ID: Samuel Berger, male   DOB: 12-10-1945, 66 y.o.   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 MRI revealed multiple very small infarcts left parietal lobe. Patient had rapid resolution of right hemiparesis and aphasia within an hour or 2 of beginning of aunt after TPA infused. Has had no recurrence of left brain symptoms.  Objective:  Filed Vitals:   01/07/12 0900  BP: 143/66  Pulse: 84  Temp:   Resp: 17    General alert and oriented x3 in no apparent distress Neurologic exam normal Lungs no rhonchi or wheezing   Labs:  Lab 01/07/12 0439 01/05/12 1803 01/05/12 1756  CREATININE 0.95 1.00 0.83    Lab 01/07/12 0439 01/05/12 1803 01/05/12 1756  NA 139 138 135  K 4.2 3.7 3.7  CL 104 103 99  CO2 26 -- 23  BUN 15 18 18   CREATININE 0.95 1.00 0.83  LABGLOM -- -- --  GLUCOSE 130* -- --  CALCIUM 9.7 -- 9.8    Lab 01/07/12 0439 01/05/12 1803 01/05/12 1756  WBC 11.0* -- 13.9*  HGB 16.1 15.6 15.7  HCT 45.9 46.0 43.7  PLT 198 -- 206    Lab 01/05/12 1756  INR 0.95    I/O last 3 completed shifts: In: 1059.2 [P.O.:220; I.V.:839.2] Out: 2975 [Urine:2975]  Imaging: Ct Angio Head W/cm &/or Wo Cm  01/05/2012  *RADIOLOGY REPORT*  Clinical Data:  Code stroke.  Left MCA territory.  CT ANGIOGRAPHY HEAD AND NECK  Technique:  Multidetector CT imaging of the head and neck was performed using the standard protocol during bolus administration of intravenous contrast.  Multiplanar CT image reconstructions including MIPs were obtained to evaluate the vascular anatomy. Carotid stenosis measurements (when applicable) are obtained utilizing NASCET criteria, using the distal internal carotid diameter as the denominator.  Contrast: 50mL OMNIPAQUE IOHEXOL 350 MG/ML SOLN  Comparison:  Head CT same day  CTA NECK  Findings:  Lung apices are clear except for mild scarring.  No superior mediastinal pathology.   There is atherosclerosis of the aorta.  Branching pattern of brachiocephalic vessels is normal.  No origin stenoses.  The right common carotid artery is widely patent to the bifurcation.  There is severe stenosis of the proximal ICA with a string sign.  This consistent with 95% or greater stenosis.  Beyond that, the cervical internal carotid artery shows flow but is markedly diminished in caliber because of the reduced inflow.  The left common carotid arteries widely patent to the bifurcation. There is atherosclerotic disease at the carotid bifurcation. Minimal diameter of the proximal internal carotid artery is 4 mm. Compared to a more distal cervical ICA diameter of 5 mm, this indicates a 20% stenosis.  There is atherosclerosis of the right vertebral artery origin. This is a small vessel.  Therefore, the stenosis is difficult to accurately measure.  I estimate this at 50%.  The left vertebral artery is also a small vessel.  There does not appear to be any stenosis at its origin.  Both vertebral arteries, though a small, are patent through the cervical region to the foramen magnum.  The soft tissues of the neck are unremarkable except for the thyroid nodule the lower pole on the left measuring 2 cm in diameter.  Ultrasound would be suggested to evaluate this if clinically appropriate.   Review of the MIP images confirms the above findings.  IMPRESSION:  95% or greater stenosis of the right  internal carotid artery proximally.  Beyond that, the vessel is markedly narrowed because of diminished inflow.  20% stenosis of the proximal left internal carotid artery.  Both vertebral arteries are diminutive vessels but are patent through the neck.  I estimate there is a 50% stenosis of the proximal right vertebral artery.  CTA HEAD  Findings:  Both internal carotid arteries are patent through the skull base, with the right being a diminutive vessel because of reduced inflow.  There is extensive atherosclerotic calcification  in both carotid siphon regions.  Stenosis in that region is estimated at 50-70% bilaterally.  The right anterior and middle cerebral vessels are patent without proximal stenosis, aneurysm or vascular malformation.  The left anterior and middle cerebral vessels are patent.  The M1 segment shows pronounced irregularity.  Stenosis at the M1 origin is estimated at 50%.  I do not see an intraluminal filling defect at this moment however.  No missing distal branches are appreciated.  The right vertebral artery is a small vessel that appears to be patent to the basilar.  The left vertebral artery is a small vessel patent to the foramen magnum but without demonstrable flow beyond that.  There is reconstitution or retrograde flow within the distal left vertebral.  The basilar artery is a small vessel but does show flow.  Both posterior cerebral arteries receive there supply from the anterior circulation.  The brain shows old small vessel infarctions within the deep white matter.  There is no identifiable acute infarction.  No intracranial hemorrhage.   Review of the MIP images confirms the above findings.  IMPRESSION: The right internal carotid artery is diminutive because of reduced inflow.  Both internal carotid arteries are patent through the siphon region.  There is extensive calcific atherosclerosis of both carotid siphons with stenoses on the order of 50-70% bilaterally.  Both anterior and middle cerebral vessels are patent.  No evidence of intraluminal filling defect.  There is irregularity of the M1 segment on the left with stenosis of the left M1 segment of 50%. No missing distal branch vessels are identified.  Both vertebral arteries are diminutive.  The right vertebral artery is patent to the basilar.  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  There is reconstituted or retrograde flow in the distal left vertebral artery.  The basilar artery is a very diminutive vessel but does show  flow.  This vessel supplies both superior cerebellar arteries.  Both posterior cerebral arteries receive most to their supply from the anterior circulation.   Original Report Authenticated By: Thomasenia Sales, M.D.    Ct Head Wo Contrast  01/05/2012  *RADIOLOGY REPORT*  Clinical Data: Code stroke.  Right-sided weakness.  Right facial droop.  CT HEAD WITHOUT CONTRAST  Technique:  Contiguous axial images were obtained from the base of the skull through the vertex without contrast.  Comparison: 06/23/2008  Findings: The cerebellum, brain stem, cerebral peduncles, and thalami appear normal.  A small remote lacunar infarct involving the anterior limb of the left internal capsule and head of the left caudate nucleus is observed.  There is also a small lacunar infarct in the head of the right caudate nucleus appears old.  The basal ganglia appear otherwise unremarkable.  Ventricular system and basilar cisterns unremarkable.  Mild chronic left maxillary sinusitis noted. No intracranial hemorrhage, mass lesion, or acute infarction is identified.  IMPRESSION:  1.  Bilateral old lacunar infarcts involving the head of the caudate nuclei and adjacent  anterior limbs of the internal capsule. No acute intracranial findings. 2.  Mild chronic left maxillary sinusitis.  I discussed these findings by telephone with Dr. Viviann Spare Rancour at 6:11 p.m. on 01/05/2012.   Original Report Authenticated By: Dellia Cloud, M.D.    Ct Angio Neck W/cm &/or Wo/cm  01/05/2012  *RADIOLOGY REPORT*  Clinical Data:  Code stroke.  Left MCA territory.  CT ANGIOGRAPHY HEAD AND NECK  Technique:  Multidetector CT imaging of the head and neck was performed using the standard protocol during bolus administration of intravenous contrast.  Multiplanar CT image reconstructions including MIPs were obtained to evaluate the vascular anatomy. Carotid stenosis measurements (when applicable) are obtained utilizing NASCET criteria, using the distal internal  carotid diameter as the denominator.  Contrast: 50mL OMNIPAQUE IOHEXOL 350 MG/ML SOLN  Comparison:  Head CT same day  CTA NECK  Findings:  Lung apices are clear except for mild scarring.  No superior mediastinal pathology.  There is atherosclerosis of the aorta.  Branching pattern of brachiocephalic vessels is normal.  No origin stenoses.  The right common carotid artery is widely patent to the bifurcation.  There is severe stenosis of the proximal ICA with a string sign.  This consistent with 95% or greater stenosis.  Beyond that, the cervical internal carotid artery shows flow but is markedly diminished in caliber because of the reduced inflow.  The left common carotid arteries widely patent to the bifurcation. There is atherosclerotic disease at the carotid bifurcation. Minimal diameter of the proximal internal carotid artery is 4 mm. Compared to a more distal cervical ICA diameter of 5 mm, this indicates a 20% stenosis.  There is atherosclerosis of the right vertebral artery origin. This is a small vessel.  Therefore, the stenosis is difficult to accurately measure.  I estimate this at 50%.  The left vertebral artery is also a small vessel.  There does not appear to be any stenosis at its origin.  Both vertebral arteries, though a small, are patent through the cervical region to the foramen magnum.  The soft tissues of the neck are unremarkable except for the thyroid nodule the lower pole on the left measuring 2 cm in diameter.  Ultrasound would be suggested to evaluate this if clinically appropriate.   Review of the MIP images confirms the above findings.  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  Beyond that, the vessel is markedly narrowed because of diminished inflow.  20% stenosis of the proximal left internal carotid artery.  Both vertebral arteries are diminutive vessels but are patent through the neck.  I estimate there is a 50% stenosis of the proximal right  vertebral artery.  CTA HEAD  Findings:  Both internal carotid arteries are patent through the skull base, with the right being a diminutive vessel because of reduced inflow.  There is extensive atherosclerotic calcification in both carotid siphon regions.  Stenosis in that region is estimated at 50-70% bilaterally.  The right anterior and middle cerebral vessels are patent without proximal stenosis, aneurysm or vascular malformation.  The left anterior and middle cerebral vessels are patent.  The M1 segment shows pronounced irregularity.  Stenosis at the M1 origin is estimated at 50%.  I do not see an intraluminal filling defect at this moment however.  No missing distal branches are appreciated.  The right vertebral artery is a small vessel that appears to be patent to the basilar.  The left vertebral artery is a small  vessel patent to the foramen magnum but without demonstrable flow beyond that.  There is reconstitution or retrograde flow within the distal left vertebral.  The basilar artery is a small vessel but does show flow.  Both posterior cerebral arteries receive there supply from the anterior circulation.  The brain shows old small vessel infarctions within the deep white matter.  There is no identifiable acute infarction.  No intracranial hemorrhage.   Review of the MIP images confirms the above findings.  IMPRESSION: The right internal carotid artery is diminutive because of reduced inflow.  Both internal carotid arteries are patent through the siphon region.  There is extensive calcific atherosclerosis of both carotid siphons with stenoses on the order of 50-70% bilaterally.  Both anterior and middle cerebral vessels are patent.  No evidence of intraluminal filling defect.  There is irregularity of the M1 segment on the left with stenosis of the left M1 segment of 50%. No missing distal branch vessels are identified.  Both vertebral arteries are diminutive.  The right vertebral artery is patent to the  basilar.  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  There is reconstituted or retrograde flow in the distal left vertebral artery.  The basilar artery is a very diminutive vessel but does show flow.  This vessel supplies both superior cerebellar arteries.  Both posterior cerebral arteries receive most to their supply from the anterior circulation.   Original Report Authenticated By: Thomasenia Sales, M.D.    Mr Brain Wo Contrast  01/06/2012  *RADIOLOGY REPORT*  Clinical Data: The patient treated for stroke and left middle cerebral artery territory.  Follow-up.  MRI HEAD WITHOUT CONTRAST  Technique:  Multiplanar, multiecho pulse sequences of the brain and surrounding structures were obtained according to standard protocol without intravenous contrast.  Comparison: CT and CT angiography 01/05/2012  Findings: Diffusion imaging shows a cluster of sub-centimeter infarctions within the anterior to mid cerebellum on the left peripherally.  There is no significant swelling.  No hemorrhage or mass effect.  There are a few old small vessel infarctions in the right cerebellum.  The pons appears normal.  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  There is some residual hemosiderin in the area of the basal ganglia infarctions.  No large vessel territory infarction.  No mass lesion, hydrocephalus or extra-axial collection.  No pituitary mass.  No significant sinus disease. There is a small amount of fluid in the mastoid air cells.  No skull or skull base lesion.  IMPRESSION: Cluster of sub-centimeter acute infarctions within the periphery of the lower to mid left cerebellar hemisphere.  Several old small vessel cerebellar infarctions, several old lacunar infarctions within the basal ganglia and moderate chronic small vessel changes in the hemispheric white  matter.   Original Report Authenticated By: Thomasenia Sales, M.D.    Dg Chest Port 1 View  01/05/2012   *RADIOLOGY REPORT*  Clinical Data: Hypertension, possible aspiration  PORTABLE CHEST - 1 VIEW  Comparison: 10/28/2009  Findings: Lungs clear.  Heart size and pulmonary vascularity normal.  No effusion.  Visualized bones unremarkable.  IMPRESSION: No acute disease   Original Report Authenticated By: Osa Craver, M.D.     Assessment/Plan:  POD #2  LOS: 2 days  s/p   #1 acute left brain CVA by MRI scan with complete resolution of symptoms #2 severe right ICA stenosis-mild  left ICA stenosis with evidence of bilateral intracerebral disease #3 Aleve we should wait approximately 4 weeks before proceeding with right carotid endarterectomy in light of acute left brain stroke. There is no evidence of hemorrhage or significant edema. If patient develops right brain symptoms and we should proceed immediately with right carotid surgery We'll plan to see patient in office in 3-4 weeks and then arrange right carotid endarterectomy Discussed this plan at length with patient and he is in agreement   Josephina Gip, MD 01/07/2012 10:07 AM

## 2012-01-07 NOTE — Evaluation (Signed)
Occupational Therapy Evaluation Patient Details Name: Samuel Berger MRN: [id number] DOB: October 03, 1945 Today's Date: 01/07/2012 Time: 1050-1105 OT Time Calculation (min): 15 min  OT Assessment / Plan / Recommendation Clinical Impression  Pt admitted with right hemiparesis and aphasia- administered tPA with resolution of symptoms. Pt at baseline functioning with no residual symptoms. No further acute OT indicated at this time. Will sign off.    OT Assessment  Patient does not need any further OT services    Follow Up Recommendations  No OT follow up    Barriers to Discharge      Equipment Recommendations  None recommended by OT    Recommendations for Other Services    Frequency       Precautions / Restrictions Precautions Precautions: None Restrictions Weight Bearing Restrictions: No   Pertinent Vitals/Pain Pt denies any pain at this time    ADL  Eating/Feeding: Performed;Independent Where Assessed - Eating/Feeding: Chair Grooming: Simulated;Independent Where Assessed - Grooming: Unsupported standing Upper Body Bathing: Simulated;Independent Where Assessed - Upper Body Bathing: Unsupported sit to stand Lower Body Bathing: Simulated;Independent Where Assessed - Lower Body Bathing: Supported sit to Pharmacist, hospital: Performed;Modified independent Toilet Transfer Method: Sit to Barista: Regular height toilet Toileting - Clothing Manipulation and Hygiene: Simulated;Independent Where Assessed - Toileting Clothing Manipulation and Hygiene: Sit to stand from 3-in-1 or toilet Transfers/Ambulation Related to ADLs: Pt I with ambulation from bed to chair ADL Comments: Pt reports he is at baseline functioning    OT Diagnosis:    OT Problem List:   OT Treatment Interventions:     OT Goals    Visit Information  Last OT Received On: 01/07/12 Assistance Needed: +1    Subjective Data  Subjective: I'm doing so much better, thank God Patient Stated  Goal: Return home with wife   Prior Functioning     Home Living Lives With: Spouse Available Help at Discharge: Available PRN/intermittently Type of Home: House Home Access: Stairs to enter [employer] of Steps: 2 Entrance Stairs-Rails: None Home Layout: Multi-level;Able to live on main level with bedroom/bathroom Bathroom Shower/Tub: Walk-in shower;Door [employer] Toilet: Handicapped height Bathroom Accessibility: Yes How Accessible: Accessible via walker Home Adaptive Equipment: Built-in shower seat Prior Function Level of Independence: Independent Able to Take Stairs?: Reciprically Driving: Yes Communication Communication: No difficulties Dominant Hand: Right         Vision/Perception Vision - Assessment Additional Comments: no field deficits; extraocular movement and moving saccades WFL   Cognition  Overall Cognitive Status: Appears within functional limits for tasks assessed/performed Arousal/Alertness: Awake/alert Orientation Level: Appears intact for tasks assessed Behavior During Session: [facility] for tasks performed    Extremity/Trunk Assessment Right Upper Extremity Assessment RUE ROM/Strength/Tone: Within functional levels RUE Sensation: WFL - Light Touch RUE Coordination: WFL - gross/fine motor Left Upper Extremity Assessment LUE ROM/Strength/Tone: Within functional levels LUE Sensation: WFL - Light Touch LUE Coordination: WFL - gross/fine motor     Mobility Bed Mobility Bed Mobility: Supine to Sit;Sitting - Scoot to Edge of Bed Supine to Sit: 7: Independent;HOB flat Sitting - Scoot to Edge of Bed: 7: Independent Transfers Transfers: Sit to Stand;Stand to Sit Sit to Stand: 7: Independent;From bed Stand to Sit: 7: Independent;To chair/3-in-1;With armrests     Shoulder Instructions     Exercise     Balance     End of Session OT - End of Session Equipment Utilized During Treatment: Gait belt Activity Tolerance: Patient tolerated  treatment well Patient left:  (w/c with tech  to be transferred)  GO     Kalee Mcclenathan 01/07/2012, 2:42 PM

## 2012-01-08 ENCOUNTER — Encounter (HOSPITAL_COMMUNITY)
Admission: EM | Disposition: A | Discharge status: Home / Self Care | Payer: Self-pay | Source: Home / Self Care | Attending: Neurology

## 2012-01-08 ENCOUNTER — Encounter (HOSPITAL_COMMUNITY): Payer: Self-pay | Admitting: Gastroenterology

## 2012-01-08 DIAGNOSIS — I6789 Other cerebrovascular disease: Secondary | ICD-10-CM

## 2012-01-08 DIAGNOSIS — I6350 Cerebral infarction due to unspecified occlusion or stenosis of unspecified cerebral artery: Secondary | ICD-10-CM | POA: Diagnosis not present

## 2012-01-08 HISTORY — PX: TEE WITHOUT CARDIOVERSION: SHX5443

## 2012-01-08 LAB — GLUCOSE, CAPILLARY
Glucose-Capillary: 121 mg/dL — ABNORMAL HIGH (ref 70–99)
Glucose-Capillary: 126 mg/dL — ABNORMAL HIGH (ref 70–99)

## 2012-01-08 SURGERY — ECHOCARDIOGRAM, TRANSESOPHAGEAL
Anesthesia: Moderate Sedation

## 2012-01-08 MED ORDER — CLOPIDOGREL BISULFATE 75 MG PO TABS: 75.0000 mg | ORAL_TABLET | Freq: Every day | ORAL | Status: DC

## 2012-01-08 MED ORDER — FENTANYL CITRATE 0.05 MG/ML IJ SOLN
INTRAMUSCULAR | Status: DC | PRN
  Administered 2012-01-08 (×2): 25 ug via INTRAVENOUS

## 2012-01-08 MED ORDER — FENTANYL CITRATE 0.05 MG/ML IJ SOLN
INTRAMUSCULAR | Status: AC
  Filled 2012-01-08: qty 2

## 2012-01-08 MED ORDER — MIDAZOLAM HCL 5 MG/ML IJ SOLN
INTRAMUSCULAR | Status: AC
  Filled 2012-01-08: qty 2

## 2012-01-08 MED ORDER — ATORVASTATIN CALCIUM 40 MG PO TABS
40.0000 mg | ORAL_TABLET | Freq: Every day | ORAL | Status: DC
  Filled 2012-01-08: qty 1

## 2012-01-08 MED ORDER — AMLODIPINE BESYLATE 10 MG PO TABS
10.0000 mg | ORAL_TABLET | Freq: Every day | ORAL | Status: DC
  Filled 2012-01-08: qty 1

## 2012-01-08 MED ORDER — BUTAMBEN-TETRACAINE-BENZOCAINE 2-2-14 % EX AERO
INHALATION_SPRAY | CUTANEOUS | Status: DC | PRN
  Administered 2012-01-08: 2 via TOPICAL

## 2012-01-08 MED ORDER — MIDAZOLAM HCL 10 MG/2ML IJ SOLN
INTRAMUSCULAR | Status: DC | PRN
  Administered 2012-01-08 (×2): 2 mg via INTRAVENOUS

## 2012-01-08 MED ORDER — SODIUM CHLORIDE 0.9 % IV SOLN
INTRAVENOUS | Status: DC
Start: 2012-01-08 — End: 2012-01-08

## 2012-01-08 MED ORDER — ATORVASTATIN CALCIUM 40 MG PO TABS: 40.0000 mg | ORAL_TABLET | Freq: Every day | ORAL | Status: DC

## 2012-01-08 MED ORDER — SODIUM CHLORIDE 0.9 % IV SOLN
INTRAVENOUS | Status: DC
  Administered 2012-01-08: 500 mL via INTRAVENOUS

## 2012-01-08 MED ORDER — STROKE: EARLY STAGES OF RECOVERY BOOK: 1.0000 | Freq: Once | Status: DC

## 2012-01-08 NOTE — Progress Notes (Signed)
Pt discharged to home. Discharge paperwork, including stroke education, discussed with patient. Patient verbalizes understanding. PIV's removed. Off unit by wheelchair with NT.

## 2012-01-08 NOTE — Progress Notes (Signed)
  Echocardiogram Echocardiogram Transesophageal has been performed.  Samuel Berger 01/08/2012, 11:34 AM

## 2012-01-08 NOTE — Discharge Summary (Signed)
Stroke Discharge Summary  Patient ID: Samuel Berger   MRN: [id number]      DOB: [birth year]  Date of Admission: 01/05/2012 Date of Discharge: 01/08/2012  Attending Physician:  Darcella Cheshire, MD, Stroke MD  Consulting Physician(s):     cardiology --Dr. 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 Aggressive management of disease. I do not know his trend of HGB A1C  -CAD  -Carotid Artery Disease, 95% R ICA stenosis   BMI: Body mass index is 25.62 kg/(m^2).  Past Medical History  Diagnosis Date  . Hypertension   . Hyperlipidemia   . Stroke   . Iliac artery aneurysm, right   . Diabetes mellitus   . Kidney stone   . CAD (coronary artery disease)    Past Surgical History  Procedure Date  . Tonsillectomy       Medication List     As of 01/08/2012  2:24 PM    STOP taking these medications         aspirin 81 MG tablet      TAKE these medications          stroke: mapping our early stages of recovery book Misc   1 each by Does not apply route once.      amLODipine 10 MG tablet   Commonly known as: NORVASC   Take 10 mg by mouth daily.      atorvastatin 40 MG tablet   Commonly known as: LIPITOR   Take 1 tablet (40 mg total) by mouth daily at 6 PM.      clopidogrel 75 MG tablet   Commonly known as: PLAVIX   Take 1 tablet (75 mg total) by mouth daily with breakfast.        LABORATORY STUDIES CBC    Component Value Date/Time   WBC 11.0* 01/07/2012 0439   RBC 5.22 01/07/2012 0439   HGB 16.1 01/07/2012 0439   HCT 45.9 01/07/2012 0439   PLT 198 01/07/2012 0439   MCV 87.9 01/07/2012 0439   MCH 30.8 01/07/2012 0439   MCHC 35.1 01/07/2012 0439   RDW 13.5 01/07/2012 0439   LYMPHSABS 2.5 01/07/2012 0439   MONOABS 1.6* 01/07/2012 0439   EOSABS 0.8* 01/07/2012 0439   BASOSABS 0.1 01/07/2012 0439     CMP    Component Value Date/Time   NA 139 01/07/2012 0439   K 4.2 01/07/2012 0439   CL 104 01/07/2012 0439   CO2 26 01/07/2012 0439   GLUCOSE 130* 01/07/2012 0439   BUN 15 01/07/2012 0439   CREATININE 0.95 01/07/2012 0439   CALCIUM 9.7 01/07/2012 0439   PROT 6.7 01/07/2012 0439   ALBUMIN 3.6 01/07/2012 0439   AST 12 01/07/2012 0439   ALT 11 01/07/2012 0439   ALKPHOS 72 01/07/2012 0439   BILITOT 0.4 01/07/2012 0439   GFRNONAA 84* 01/07/2012 0439   GFRAA >90 01/07/2012 0439   COAGS Lab Results  Component Value Date   INR 0.95 01/05/2012   Lipid Panel    Component Value Date/Time   CHOL 140 01/06/2012 0431   TRIG 71 01/06/2012 0431   HDL 43 01/06/2012 0431   CHOLHDL 3.3 01/06/2012 0431   VLDL 14 01/06/2012 0431   LDLCALC 83 01/06/2012 0431   HgbA1C  Lab Results  Component Value Date   HGBA1C 6.7* 01/06/2012  Cardiac Panel (last 3 results)  Basename 01/05/12 1757  CKTOTAL --  CKMB --  TROPONINI <0.30  RELINDX --   Urinalysis    Component Value Date/Time   COLORURINE YELLOW 01/05/2012 1901   APPEARANCEUR CLEAR 01/05/2012 1901   LABSPEC 1.029 01/05/2012 1901   PHURINE 5.0 01/05/2012 1901   GLUCOSEU NEGATIVE 01/05/2012 1901   HGBUR NEGATIVE 01/05/2012 1901   BILIRUBINUR SMALL* 01/05/2012 1901   KETONESUR NEGATIVE 01/05/2012 1901   PROTEINUR NEGATIVE 01/05/2012 1901   UROBILINOGEN 1.0 01/05/2012 1901   NITRITE NEGATIVE 01/05/2012 1901   LEUKOCYTESUR NEGATIVE 01/05/2012 1901   Urine Drug Screen    Component Value Date/Time   LABOPIA NONE DETECTED 01/05/2012 1902   COCAINSCRNUR NONE DETECTED 01/05/2012 1902   LABBENZ NONE DETECTED 01/05/2012 1902   AMPHETMU NONE DETECTED 01/05/2012 1902   THCU NONE DETECTED 01/05/2012 1902   LABBARB NONE DETECTED 01/05/2012 1902    SIGNIFICANT DIAGNOSTIC STUDIES  Ct Angio Head W/cm &/or Wo Cm  01/05/2012 95% or greater stenosis of the right internal carotid artery proximally. Beyond that, the vessel is markedly narrowed because of diminished inflow. 20%  stenosis of the proximal left internal carotid artery. Both vertebral arteries are diminutive vessels but are patent through the neck. I estimate there is a 50% stenosis of the proximal right vertebral artery. The right internal carotid artery is diminutive because of reduced inflow. Both internal carotid arteries are patent through the siphon region. There is extensive calcific atherosclerosis of both carotid siphons with stenoses on the order of 50-70% bilaterally. Both anterior and middle cerebral vessels are patent. No evidence of intraluminal filling defect. There is irregularity of the M1 segment on the left with stenosis of the left M1 segment of 50%. No missing distal branch vessels are identified. Both vertebral arteries are diminutive. The right vertebral artery is patent to the basilar. 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 There is reconstituted or retrograde flow in the distal left vertebral artery. The basilar artery is a very diminutive vessel but does show flow. This vessel supplies both superior cerebellar arteries. Both posterior cerebral arteries receive most to their supply from the anterior circulation.   Ct Head Wo Contrast  01/05/2012 Bilateral old lacunar infarcts involving the head of the caudate nuclei and adjacent anterior limbs of the internal capsule. No acute intracranial findings. Mild chronic left maxillary sinusitis.   Mr Brain Wo Contrast  Cluster of sub-centimeter acute infarctions within the periphery of the lower to mid left cerebellar hemisphere. Several old small vessel cerebellar infarctions, several old lacunar infarctions within the basal ganglia and moderate chronic small vessel changes in the hemispheric white matter   Dg Chest Port 1 View  No acute disease   2D Echocardiogram - Left ventricle: Technically difficult, but no definite wall abnormalities. The cavity size was normal. Wall thickness was increased in a pattern of mild  LVH. The estimated ejection fraction was 60%. Wall motion was normal; there were no regional wall motion abnormalities. - Impressions: No cardiac source of embolism was identified,but cannot be ruled out on the basis of this examination. - No cardiac source of embolism was identified, but cannot be ruled out on the basis of this examination   TEE:  - Left ventricle: Systolic function was normal. The estimated ejection fraction was in the range of 55% to 60%. Wall motion was normal; there were no regional wall motion abnormalities. - Aortic valve: No evidence of vegetation. - Mitral valve: No  evidence of vegetation. - Left atrium: No evidence of thrombus in the atrial cavity or appendage. - Atrial septum: Echo contrast study showed no right-to-left atrial level shunt, at baseline or with provocation. There was an atrial septal aneurysm. - Tricuspid valve: No evidence of vegetation. - Pulmonic valve: No evidence of vegetation.  EKG sinus tachycardia.      History of Present Illness    Samuel Berger is an 66 y.o. male with sudden onset right sided weakness at approximately 5:20 this afternoon. He was brought by EMS and on arrival was found to have severe right sided weakness.   LSN: 17:20  tPA Given: Yes, needle time 1818 per [facility] Course   [name] is a 66 y.o. male presenting with right hemiparesis. Status post IV t-PA at 1818 on 01/05/12. LKN 1720 same day. Imaging confirms an acute left cerebellar hemispheric infarct. Infarct felt to be embolic secondary to cardiac source; however, TEE did not reveal cardiac source. Patient will have outpatient telemetry monitoring arranged. On aspirin 81 mg orally every day prior to admission. Now on Plavix 75mg  daily for secondary stroke prevention. Patient with resultant right hemiparesis, however did well and is expected not to need follow up with pt/ot or speech therapy.  TEE did not show any evidence of cardiac source.  Outpatient telemetry monitoring to assess patient for atrial fibrillation as source of stroke. The cardiology office will be in touch with the patient in the near future regarding placement of this monitor.  Patient has stroke risk factors of hypertension, hyperlipidemia, diabetes mellitus and carotid stenosis.   Discharge Exam  Blood pressure 137/98, pulse 81, temperature 98.1 F (36.7 C), temperature source Oral, resp. rate 50, height 6' (1.829 m), weight 85.7 kg (188 lb 15 oz), SpO2 96.00%.   Physical Exam Pleasant elderly male not in distress.Awake alert. Afebrile. Head is nontraumatic. Neck is supple without bruit. Hearing is normal. Cardiac exam no murmur or gallop. Lungs are clear to auscultation. Distal pulses are well felt.  Neurological Exam : Awake alert oriented x 3 normal speech and language.fundi were not visualized. Vision acuity and fields appear normal. Mild left lower face asymmetry. Tongue midline. No drift. Mild diminished fine finger movements on left. Orbits right over left upper extremity. Mild left grip weak.. Normal sensation . Normal coordination  Discharge Diet   Cardiac thin liquids  Discharge Plan    Disposition:  home  Plavix for secondary stroke prevention.  Ongoing risk factor control by Primary Care Physician.  Risk factor recommendations:  LDL < 70 in diabetics, HGB A1C < 7.0, BP < 130/80.  Follow-up HUSAIN,KARRAR, MD in 1 month.  Follow-up with Dr. Delia Heady in 2 months.  I have contacted cardiology to arrange outpatient telemetry monitoring  Signed Guy Franco, [facility],  MBA, MHA [facility] Pager: [phone] 01/08/2012 2:33 PM  Scribe for Dr. Delia Heady, Stroke Center Medical Director. He has personally reviewed chart, pertinent data, examined the patient and developed the plan of care. Pager:  [phone]

## 2012-01-08 NOTE — CV Procedure (Signed)
See full TEE report in [employer]. No SOE indentified. Olga Millers

## 2012-01-08 NOTE — Clinical Documentation Improvement (Signed)
DIABETIC  DOCUMENTATION CLARIFICATION QUERY  THIS DOCUMENT IS NOT A PERMANENT PART OF THE MEDICAL RECORD  Please update your documentation within the medical record to reflect your response to this query.                                                                                        01/08/12   Dear [name],  In a better effort to capture your patient's severity of illness, reflect appropriate length of stay and utilization of resources, a review of the patient medical record has revealed the following indicators.   Based on your clinical judgment, please clarify and document in a progress note and/or discharge summary the clinical condition associated with the following supporting information: In responding to this query please exercise your independent judgment.  The fact that a query is asked, does not imply that any particular answer is desired or expected.   Hi [name],   Please clarify and specify Diabetes type:  Possible Clinical Conditions?   ___X____Diabetes Type  1 or 2 _______Controlled or uncontrolled  _______Other Condition _______Cannot Clinically determine   I do not know anything else about his diabetes. He could be bringing down his a1c so I do not want to speculate that he is doing poorly.   Reviewed: additional documentation in the medical record   Thank You,  Saul Fordyce  Clinical Documentation Specialist: [phone] Pager  Health Information Management Branch

## 2012-01-08 NOTE — Progress Notes (Signed)
Stroke Team Progress Note  HISTORY  Samuel Berger is an 66 y.o. male with sudden onset right sided weakness at approximately 5:20 this afternoon. He was brought by EMS and on arrival was found to have severe right sided weakness.   LSN: 17:[id number]  tPA Given: Yes, needle time 1818 per [facility]  SUBJECTIVE Sitting up in bed.  Overall feels condition is rapidly improving. He is getting ready for TEE soon.   OBJECTIVE Most recent Vital Signs: Filed Vitals:   01/08/12 1138 01/08/12 1140 01/08/12 1150 01/08/12 1200  BP: 140/82 140/82 105/59 137/98  Pulse:      Temp:      TempSrc:      Resp: 34 26 18 50  Height:      Weight:      SpO2: 97% 98% 98% 96%   CBG (last 3)   Basename 01/08/12 0645 01/07/12 2231 01/07/12 1633  GLUCAP 126* 121* 130*   Intake/Output from previous day: 10/07 0701 - 10/08 0700 In: 120 [P.O.:100] Out: 200 [Urine:200]  IV Fluid Intake:      . sodium chloride 500 mL (01/08/12 1033)  . sodium chloride      MEDICATIONS     .  stroke: mapping our early stages of recovery book   Does not apply Once  . clopidogrel  75 mg Oral Q breakfast  . insulin aspart  0-9 Units Subcutaneous TID WC  . pantoprazole  40 mg Oral Q1200   PRN:  acetaminophen, acetaminophen, labetalol, ondansetron (ZOFRAN) IV, DISCONTD: butamben-tetracaine-benzocaine, DISCONTD: fentaNYL, DISCONTD: midazolam  Diet:  Cardiac thin liquids Activity:  Bedrest DVT Prophylaxis:  SCD  CLINICALLY SIGNIFICANT STUDIES Basic Metabolic Panel:   Lab 01/07/12 0439 01/05/12 1803 01/05/12 1756  NA 139 138 --  K 4.2 3.7 --  CL 104 103 --  CO2 26 -- 23  GLUCOSE 130* 121* --  BUN 15 18 --  CREATININE 0.95 1.00 --  CALCIUM 9.7 -- 9.8  MG -- -- --  PHOS -- -- --   Liver Function Tests:   Lab 01/07/12 0439 01/05/12 1756  AST 12 15  ALT 11 14  ALKPHOS 72 73  BILITOT 0.4 0.3  PROT 6.7 7.1  ALBUMIN 3.6 4.1   CBC:   Lab 01/07/12 0439 01/05/12 1803 01/05/12 1756  WBC 11.0* -- 13.9*  NEUTROABS 6.0 --  6.6  HGB 16.1 15.6 --  HCT 45.9 46.0 --  MCV 87.9 -- 86.2  PLT 198 -- 206   Coagulation:   Lab 01/05/12 1756  LABPROT 12.6  INR 0.95   Cardiac Enzymes:   Lab 01/05/12 1757  CKTOTAL --  CKMB --  CKMBINDEX --  TROPONINI <0.30   Urinalysis:   Lab 01/05/12 1901  COLORURINE YELLOW  LABSPEC 1.029  PHURINE 5.0  GLUCOSEU NEGATIVE  HGBUR NEGATIVE  BILIRUBINUR SMALL*  KETONESUR NEGATIVE  PROTEINUR NEGATIVE  UROBILINOGEN 1.0  NITRITE NEGATIVE  LEUKOCYTESUR NEGATIVE   Lipid Panel    Component Value Date/Time   CHOL 140 01/06/2012 0431   TRIG 71 01/06/2012 0431   HDL 43 01/06/2012 0431   CHOLHDL 3.3 01/06/2012 0431   VLDL 14 01/06/2012 0431   LDLCALC 83 01/06/2012 0431   HgbA1C  Lab Results  Component Value Date   HGBA1C 6.7* 01/06/2012    Urine Drug Screen:     Component Value Date/Time   LABOPIA NONE DETECTED 01/05/2012 1902   COCAINSCRNUR NONE DETECTED 01/05/2012 1902   LABBENZ NONE DETECTED 01/05/2012 1902   AMPHETMU NONE DETECTED  01/05/2012 1902   THCU NONE DETECTED 01/05/2012 1902   LABBARB NONE DETECTED 01/05/2012 1902     Ct Angio Head W/cm &/or Wo Cm 01/05/2012  95% or greater stenosis of the right internal carotid artery proximally.  Beyond that, the vessel is markedly narrowed because of diminished inflow.  20% stenosis of the proximal left internal carotid artery.  Both vertebral arteries are diminutive vessels but are patent through the neck.  I estimate there is a 50% stenosis of the proximal right vertebral artery.  The right internal carotid artery is diminutive because of reduced inflow.  Both internal carotid arteries are patent through the siphon region.  There is extensive calcific atherosclerosis of both carotid siphons with stenoses on the order of 50-70% bilaterally.  Both anterior and middle cerebral vessels are patent.  No evidence of intraluminal filling defect.  There is irregularity of the M1 segment on the left with stenosis of the left M1 segment of  50%. No missing distal branch vessels are identified.  Both vertebral arteries are diminutive.  The right vertebral artery is patent to the basilar.  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  There is reconstituted or retrograde flow in the distal left vertebral artery.  The basilar artery is a very diminutive vessel but does show flow.  This vessel supplies both superior cerebellar arteries.  Both posterior cerebral arteries receive most to their supply from the anterior circulation.     Ct Head Wo Contrast 01/05/2012 Bilateral old lacunar infarcts involving the head of the caudate nuclei and adjacent anterior limbs of the internal capsule. No acute intracranial findings. Mild chronic left maxillary sinusitis.    Mr Brain Wo Contrast Cluster of sub-centimeter acute infarctions within the periphery of the lower to mid left cerebellar hemisphere.  Several old small vessel cerebellar infarctions, several old lacunar infarctions within the basal ganglia and moderate chronic small vessel changes in the hemispheric white matter  Dg Chest Port 1 View  No acute disease    2D Echocardiogram  - Left ventricle: Technically difficult, but no definite wall abnormalities. The cavity size was normal. Wall thickness was increased in a pattern of mild LVH. The estimated ejection fraction was 60%. Wall motion was normal; there were no regional wall motion abnormalities. - Impressions: No cardiac source of embolism was identified,but cannot be ruled out on the basis of this examination. - No cardiac source of embolism was identified, but cannot be ruled out on the basis of this examination  TEE: - Left ventricle: Systolic function was normal. The estimated ejection fraction was in the range of 55% to 60%. Wall motion was normal; there were no regional wall motion abnormalities. - Aortic valve: No evidence of vegetation. - Mitral valve: No evidence of vegetation. - Left atrium: No  evidence of thrombus in the atrial cavity or appendage. - Atrial septum: Echo contrast study showed no right-to-left atrial level shunt, at baseline or with provocation. There was an atrial septal aneurysm. - Tricuspid valve: No evidence of vegetation. - Pulmonic valve: No evidence of vegetation.  EKG  sinus tachycardia.    Therapy Recommendations PT -; OT - ; ST -no followup    Physical Exam  Pleasant elderly male not in distress.Awake alert. Afebrile. Head is nontraumatic. Neck is supple without bruit. Hearing is normal. Cardiac exam no murmur or gallop. Lungs are clear to auscultation. Distal pulses are well felt.  Neurological Exam : Awake alert oriented x 3 normal speech and  language.fundi were not visualized. Vision acuity and fields appear normal. Mild left lower face asymmetry. Tongue midline. No drift. Mild diminished fine finger movements on left. Orbits right over left upper extremity. Mild left grip weak.. Normal sensation . Normal coordination.    ASSESSMENT Mr. TABIUS ROOD is a 66 y.o. male presenting with right hemiparesis. Status post IV t-PA  at 1818 on 01/05/12. LKN 1720 same day. Imaging confirms an acute left cerebellar hemispheric infarct. Infarct felt to be embolic secondary to cardiac source; however, work up underway. On aspirin 81 mg orally every day prior to admission. Now on Plavix 75mg  daily for secondary stroke prevention. Patient with resultant right hemiparesis.  -left cerebellar hemispheric infarct -Hypertension -Hyperlipidemia, LDL 83 -Right iliac artery aneurysm -Diabetes Mellitus, HGB A1C 6.7 -CAD -Carotid Artery Disease, 95% R ICA stenosis, seen by vascular surgeon Dr. Hart Rochester, who recommends waiting approximately 4 weeks before proceeding with R CEA. He will see in office prior to that time.   Hospital day # 3  TREATMENT/PLAN  Continue clopidogrel 75 mg orally every day for secondary stroke prevention. TEE did not show source of  embolus. Outpatient telemetry monitoring to assess patient for atrial fibrillation as source of stroke. The cardiology office will be in touch with the patient in the near future regarding placement of this monitor. Risk factor Management: LDL < 70, Hgb A1C < 7.0  Was on lipitor 20 mg at home, increase to 40mg  daily. Restart blood pressure medications, norvasc Outpatient followup with vascular surgeons for R ICA stenosis as instructed by Dr. Hart Rochester. Followup with Dr. Pearlean Brownie in 2 mos.  Guy Franco, PAC,  MBA, MHA [facility] Pager: [phone] 01/08/2012 2:01 PM  Scribe for Dr. Delia Heady, Stroke Center Medical Director. He has personally reviewed chart, pertinent data, examined the patient and developed the plan of care. Pager:  [phone]

## 2012-01-08 NOTE — Interval H&P Note (Signed)
History and Physical Interval Note:  01/08/2012 11:12 AM  Samuel Berger  has presented today for surgery, with the diagnosis of stroke  The various methods of treatment have been discussed with the patient and family. After consideration of risks, benefits and other options for treatment, the patient has consented to  Procedure(s) (LRB) with comments: TRANSESOPHAGEAL ECHOCARDIOGRAM (TEE) (N/A) as a surgical intervention .  The patient's history has been reviewed, patient examined, no change in status, stable for surgery.  I have reviewed the patient's chart and labs.  Questions were answered to the patient's satisfaction.     Olga Millers

## 2012-01-08 NOTE — H&P (View-Only) (Signed)
Stroke Team Progress Note  HISTORY  Samuel Berger is an 66 y.o. male with sudden onset right sided weakness at approximately 5:20 this afternoon. He was brought by EMS and on arrival was found to have severe right sided weakness.   LSN: 17:20  tPA Given: Yes, needle time 1818 per RR  SUBJECTIVE Sitting up in bed.  Overall he feels his condition is rapidly improving.   OBJECTIVE Most recent Vital Signs: Filed Vitals:   01/07/12 0200 01/07/12 0400 01/07/12 0600 01/07/12 0715  BP: 127/48 109/58 131/73   Pulse: 73 65 66   Temp:  97.8 F (36.6 C)  97.8 F (36.6 C)  TempSrc:  Oral  Oral  Resp: 17 15 15   Height:      Weight:   85.7 kg (188 lb 15 oz)   SpO2: 95% 94% 94%    CBG (last 3)   Basename 01/06/12 1655 01/06/12 1122 01/06/12 0726  GLUCAP 140* 246* 118*   Intake/Output from previous day: 10/06 0701 - 10/07 0700 In: 470 [P.O.:120; I.V.:350] Out: 1425 [Urine:1425]  IV Fluid Intake:     . DISCONTD: sodium chloride Stopped (01/06/12 1400)    MEDICATIONS    .  stroke: mapping our early stages of recovery book   Does not apply Once  . aspirin  325 mg Oral Daily  . insulin aspart  0-9 Units Subcutaneous TID WC  . pantoprazole  40 mg Oral Q1200  . DISCONTD: pantoprazole (PROTONIX) IV  40 mg Intravenous QHS   PRN:  acetaminophen, acetaminophen, labetalol, ondansetron (ZOFRAN) IV  Diet:  Cardiac thin liquids Activity:  Bedrest DVT Prophylaxis:  SCD  CLINICALLY SIGNIFICANT STUDIES Basic Metabolic Panel:  Lab 01/07/12 0439 01/05/12 1803 01/05/12 1756  NA 139 138 --  K 4.2 3.7 --  CL 104 103 --  CO2 26 -- 23  GLUCOSE 130* 121* --  BUN 15 18 --  CREATININE 0.95 1.00 --  CALCIUM 9.7 -- 9.8  MG -- -- --  PHOS -- -- --   Liver Function Tests:  Lab 01/07/12 0439 01/05/12 1756  AST 12 15  ALT 11 14  ALKPHOS 72 73  BILITOT 0.4 0.3  PROT 6.7 7.1  ALBUMIN 3.6 4.1   CBC:  Lab 01/07/12 0439 01/05/12 1803 01/05/12 1756  WBC 11.0* -- 13.9*  NEUTROABS 6.0 -- 6.6    HGB 16.1 15.6 --  HCT 45.9 46.0 --  MCV 87.9 -- 86.2  PLT 198 -- 206   Coagulation:  Lab 01/05/12 1756  LABPROT 12.6  INR 0.95   Cardiac Enzymes:  Lab 01/05/12 1757  CKTOTAL --  CKMB --  CKMBINDEX --  TROPONINI <0.30   Urinalysis:  Lab 01/05/12 1901  COLORURINE YELLOW  LABSPEC 1.029  PHURINE 5.0  GLUCOSEU NEGATIVE  HGBUR NEGATIVE  BILIRUBINUR SMALL*  KETONESUR NEGATIVE  PROTEINUR NEGATIVE  UROBILINOGEN 1.0  NITRITE NEGATIVE  LEUKOCYTESUR NEGATIVE   Lipid Panel    Component Value Date/Time   CHOL 140 01/06/2012 0431   TRIG 71 01/06/2012 0431   HDL 43 01/06/2012 0431   CHOLHDL 3.3 01/06/2012 0431   VLDL 14 01/06/2012 0431   LDLCALC 83 01/06/2012 0431   HgbA1C  Lab Results  Component Value Date   HGBA1C 6.7* 01/06/2012    Urine Drug Screen:     Component Value Date/Time   LABOPIA NONE DETECTED 01/05/2012 1902   COCAINSCRNUR NONE DETECTED 01/05/2012 1902   LABBENZ NONE DETECTED 01/05/2012 1902   AMPHETMU NONE DETECTED 01/05/2012 1902     THCU NONE DETECTED 01/05/2012 1902   LABBARB NONE DETECTED 01/05/2012 1902    Alcohol Level: No results found for this basename: ETH:2 in the last 168 hours  Ct Angio Head W/cm &/or Wo Cm 01/05/2012  95% or greater stenosis of the right internal carotid artery proximally.  Beyond that, the vessel is markedly narrowed because of diminished inflow.  20% stenosis of the proximal left internal carotid artery.  Both vertebral arteries are diminutive vessels but are patent through the neck.  I estimate there is a 50% stenosis of the proximal right vertebral artery.  The right internal carotid artery is diminutive because of reduced inflow.  Both internal carotid arteries are patent through the siphon region.  There is extensive calcific atherosclerosis of both carotid siphons with stenoses on the order of 50-70% bilaterally.  Both anterior and middle cerebral vessels are patent.  No evidence of intraluminal filling defect.  There is irregularity of  the M1 segment on the left with stenosis of the left M1 segment of 50%. No missing distal branch vessels are identified.  Both vertebral arteries are diminutive.  The right vertebral artery is patent to the basilar.  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  There is reconstituted or retrograde flow in the distal left vertebral artery.  The basilar artery is a very diminutive vessel but does show flow.  This vessel supplies both superior cerebellar arteries.  Both posterior cerebral arteries receive most to their supply from the anterior circulation.     Ct Head Wo Contrast 01/05/2012 Bilateral old lacunar infarcts involving the head of the caudate nuclei and adjacent anterior limbs of the internal capsule. No acute intracranial findings. Mild chronic left maxillary sinusitis.    Mr Brain Wo Contrast Cluster of sub-centimeter acute infarctions within the periphery of the lower to mid left cerebellar hemisphere.  Several old small vessel cerebellar infarctions, several old lacunar infarctions within the basal ganglia and moderate chronic small vessel changes in the hemispheric white matter  Dg Chest Port 1 View  No acute disease    2D Echocardiogram  - Left ventricle: Technically difficult, but no definite wall abnormalities. The cavity size was normal. Wall thickness was increased in a pattern of mild LVH. The estimated ejection fraction was 60%. Wall motion was normal; there were no regional wall motion abnormalities. - Impressions: No cardiac source of embolism was identified,but cannot be ruled out on the basis of this examination. - No cardiac source of embolism was identified, but cannot be ruled out on the basis of this examination  EKG  sinus tachycardia.   Therapy Recommendations PT -; OT - ; ST -   Physical Exam  Pleasant elderly male not in distress.Awake alert. Afebrile. Head is nontraumatic. Neck is supple without bruit. Hearing is normal. Cardiac exam no  murmur or gallop. Lungs are clear to auscultation. Distal pulses are well felt.  Neurological Exam : Awake alert oriented x 3 normal speech and language.fundi were not visualized. Vision acuity and fields appear normal. Mild left lower face asymmetry. Tongue midline. No drift. Mild diminished fine finger movements on left. Orbits right over left upper extremity. Mild left grip weak.. Normal sensation . Normal coordination.  ASSESSMENT [name] is a 66 y.o. male presenting with right hemiparesis. Status post IV t-PA  at 1818 on 01/05/12. LKN 1720 same day. Imaging confirms an acute left cerebellar hemispheric infarct. Infarct felt to be embolic secondary to cardiac source; however, work up underway.   On aspirin 81 mg orally every day prior to admission. Now on aspirin 325 mg orally every day for secondary stroke prevention. Patient with resultant right hemiparesis.  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 Lawson, who recommends waiting approximately 4 weeks before proceeding with R CEA. He will see in office prior to that time.   Hospital day # 2  TREATMENT/PLAN  Continue clopidogrel 75 mg orally every day for secondary stroke prevention. TEE to look for embolic source. Please arrange with pts cardiologist or cardiologist of choice. Will need to be NPO after midnight. If positive for PFO (patent foramen ovale), check bilateral lower extremity venous dopplers to rule out DVT as possible source of stroke.  Outpatient telemetry monitoring to assess patient for atrial fibrillation as source of stroke. Risk factor Management: LDL < 70, Hgb A1C < 7.0 Aggressive therapy Outpatient followup with vascular surgeons for R ICA stenosis  Lynn Brown, PAC,  MBA, MHA [facility] Pager: [phone] 01/07/2012 7:40 AM  Scribe for Dr.  Gianne Shugars, Stroke Center Medical Director. He has personally reviewed chart, pertinent data, examined the patient and developed the plan of care. Pager:  [phone]    

## 2012-01-08 NOTE — Plan of Care (Signed)
Problem: Consults Goal: Stroke - Ischemic/TIA Patient Education See Patient Education Module for education specifics.  Outcome: Completed/Met Date Met:  01/08/12 Given Beyond the hospital book

## 2012-01-09 ENCOUNTER — Encounter (HOSPITAL_COMMUNITY): Payer: Self-pay | Admitting: Cardiology

## 2012-01-09 ENCOUNTER — Encounter (HOSPITAL_COMMUNITY): Payer: Self-pay

## 2012-01-17 DIAGNOSIS — E782 Mixed hyperlipidemia: Secondary | ICD-10-CM | POA: Diagnosis not present

## 2012-01-17 DIAGNOSIS — I10 Essential (primary) hypertension: Secondary | ICD-10-CM | POA: Diagnosis not present

## 2012-01-17 DIAGNOSIS — Z23 Encounter for immunization: Secondary | ICD-10-CM | POA: Diagnosis not present

## 2012-01-17 DIAGNOSIS — I679 Cerebrovascular disease, unspecified: Secondary | ICD-10-CM | POA: Diagnosis not present

## 2012-01-17 DIAGNOSIS — E119 Type 2 diabetes mellitus without complications: Secondary | ICD-10-CM | POA: Diagnosis not present

## 2012-02-01 ENCOUNTER — Observation Stay (HOSPITAL_COMMUNITY)
Admission: EM | Admit: 2012-02-01 | Discharge: 2012-02-04 | Disposition: A | Discharge status: Home / Self Care | Payer: Medicare Other | Attending: Internal Medicine | Admitting: Internal Medicine

## 2012-02-01 ENCOUNTER — Encounter (HOSPITAL_COMMUNITY): Payer: Self-pay | Admitting: Emergency Medicine

## 2012-02-01 DIAGNOSIS — I10 Essential (primary) hypertension: Secondary | ICD-10-CM | POA: Diagnosis not present

## 2012-02-01 DIAGNOSIS — R55 Syncope and collapse: Secondary | ICD-10-CM | POA: Diagnosis not present

## 2012-02-01 DIAGNOSIS — R5383 Other fatigue: Secondary | ICD-10-CM | POA: Diagnosis not present

## 2012-02-01 DIAGNOSIS — I6350 Cerebral infarction due to unspecified occlusion or stenosis of unspecified cerebral artery: Principal | ICD-10-CM | POA: Insufficient documentation

## 2012-02-01 DIAGNOSIS — I6529 Occlusion and stenosis of unspecified carotid artery: Secondary | ICD-10-CM | POA: Insufficient documentation

## 2012-02-01 DIAGNOSIS — I6509 Occlusion and stenosis of unspecified vertebral artery: Secondary | ICD-10-CM | POA: Diagnosis not present

## 2012-02-01 DIAGNOSIS — E119 Type 2 diabetes mellitus without complications: Secondary | ICD-10-CM | POA: Diagnosis not present

## 2012-02-01 DIAGNOSIS — I6521 Occlusion and stenosis of right carotid artery: Secondary | ICD-10-CM

## 2012-02-01 DIAGNOSIS — R404 Transient alteration of awareness: Secondary | ICD-10-CM | POA: Diagnosis not present

## 2012-02-01 DIAGNOSIS — Z8673 Personal history of transient ischemic attack (TIA), and cerebral infarction without residual deficits: Secondary | ICD-10-CM | POA: Diagnosis not present

## 2012-02-01 DIAGNOSIS — I639 Cerebral infarction, unspecified: Secondary | ICD-10-CM

## 2012-02-01 DIAGNOSIS — E118 Type 2 diabetes mellitus with unspecified complications: Secondary | ICD-10-CM | POA: Diagnosis present

## 2012-02-01 DIAGNOSIS — R61 Generalized hyperhidrosis: Secondary | ICD-10-CM | POA: Diagnosis not present

## 2012-02-01 DIAGNOSIS — I658 Occlusion and stenosis of other precerebral arteries: Secondary | ICD-10-CM | POA: Insufficient documentation

## 2012-02-01 HISTORY — DX: Type 2 diabetes mellitus without complications: E11.9

## 2012-02-01 HISTORY — DX: Gastro-esophageal reflux disease without esophagitis: K21.9

## 2012-02-01 HISTORY — DX: Aneurysm of iliac artery: I72.3

## 2012-02-01 LAB — POCT I-STAT TROPONIN I

## 2012-02-01 LAB — GLUCOSE, CAPILLARY

## 2012-02-01 LAB — POCT I-STAT, CHEM 8
BUN: 19 mg/dL (ref 6–23)
Calcium, Ion: 1.15 mmol/L (ref 1.13–1.30)
Chloride: 101 mEq/L (ref 96–112)
Creatinine, Ser: 1.1 mg/dL (ref 0.50–1.35)
Sodium: 140 mEq/L (ref 135–145)
TCO2: 25 mmol/L (ref 0–100)

## 2012-02-01 LAB — CBC
MCV: 88.7 fL (ref 78.0–100.0)
Platelets: 147 10*3/uL — ABNORMAL LOW (ref 150–400)
RBC: 4.97 MIL/uL (ref 4.22–5.81)
WBC: 13.6 10*3/uL — ABNORMAL HIGH (ref 4.0–10.5)

## 2012-02-01 MED ORDER — AMLODIPINE BESYLATE 10 MG PO TABS
10.0000 mg | ORAL_TABLET | Freq: Every day | ORAL | Status: DC
  Administered 2012-02-02 – 2012-02-04 (×3): 10 mg via ORAL
  Filled 2012-02-01 (×3): qty 1

## 2012-02-01 MED ORDER — ONDANSETRON HCL 4 MG PO TABS: 4.0000 mg | ORAL_TABLET | Freq: Four times a day (QID) | ORAL | Status: DC | PRN

## 2012-02-01 MED ORDER — MORPHINE SULFATE 2 MG/ML IJ SOLN: 2.0000 mg | INTRAMUSCULAR | Status: DC | PRN

## 2012-02-01 MED ORDER — ACETAMINOPHEN 650 MG RE SUPP: 650.0000 mg | Freq: Four times a day (QID) | RECTAL | Status: DC | PRN

## 2012-02-01 MED ORDER — LISINOPRIL 5 MG PO TABS
5.0000 mg | ORAL_TABLET | Freq: Every day | ORAL | Status: DC
  Administered 2012-02-02 – 2012-02-04 (×3): 5 mg via ORAL
  Filled 2012-02-01 (×4): qty 1

## 2012-02-01 MED ORDER — ONDANSETRON HCL 4 MG/2ML IJ SOLN: 4.0000 mg | Freq: Four times a day (QID) | INTRAMUSCULAR | Status: DC | PRN

## 2012-02-01 MED ORDER — SODIUM CHLORIDE 0.9 % IV SOLN
INTRAVENOUS | Status: DC
  Administered 2012-02-01 – 2012-02-02 (×3): via INTRAVENOUS
  Administered 2012-02-02: 100 mL/h via INTRAVENOUS

## 2012-02-01 MED ORDER — CLOPIDOGREL BISULFATE 75 MG PO TABS
75.0000 mg | ORAL_TABLET | Freq: Every day | ORAL | Status: DC
  Administered 2012-02-02 – 2012-02-04 (×3): 75 mg via ORAL
  Filled 2012-02-01 (×4): qty 1

## 2012-02-01 MED ORDER — ATORVASTATIN CALCIUM 40 MG PO TABS
40.0000 mg | ORAL_TABLET | Freq: Every day | ORAL | Status: DC
  Administered 2012-02-01 – 2012-02-03 (×3): 40 mg via ORAL
  Filled 2012-02-01 (×4): qty 1

## 2012-02-01 MED ORDER — PIOGLITAZONE HCL 30 MG PO TABS
30.0000 mg | ORAL_TABLET | Freq: Every day | ORAL | Status: DC
  Administered 2012-02-02 – 2012-02-04 (×3): 30 mg via ORAL
  Filled 2012-02-01 (×4): qty 1

## 2012-02-01 MED ORDER — ACETAMINOPHEN 325 MG PO TABS: 650.0000 mg | ORAL_TABLET | Freq: Four times a day (QID) | ORAL | Status: DC | PRN

## 2012-02-01 MED ORDER — SODIUM CHLORIDE 0.9 % IJ SOLN
3.0000 mL | Freq: Two times a day (BID) | INTRAMUSCULAR | Status: DC
  Administered 2012-02-01 – 2012-02-04 (×4): 3 mL via INTRAVENOUS

## 2012-02-01 NOTE — H&P (Signed)
[facility] History and Physical  Samuel Berger ZOX:[id number] DOB: 1945-08-03 DOA: 02/01/2012  Referring physician: Doug Sou, MD PCP: Georgann Housekeeper, MD   Chief Complaint: Syncope  HPI: Samuel Berger is a 66 y.o. male with past medical history of hypertension hyperlipidemia and recent stroke treated with TPA about 3 weeks ago. Patient brought to the hospital because of syncopal episode. Patient was in his usual state of health, about lunchtime he was in a restaurant and he was waiting for his meal, he felt hot and soon after that he lost his consciousness, he did not fall off of his chair, bystanders told him to loss of consciousness about or less than 1 minute. Patient brought to the emergency department for further evaluation. Initial evaluation showed negative cardiac enzymes. His neurological status is back to his baseline without any deficits.  Review of Systems:  Constitutional: negative for anorexia, fevers and sweats Eyes: negative for irritation, redness and visual disturbance Ears, nose, mouth, throat, and face: negative for earaches, epistaxis, nasal congestion and sore throat Respiratory: negative for cough, dyspnea on exertion, sputum and wheezing Cardiovascular: negative for chest pain, dyspnea, lower extremity edema, orthopnea, palpitations and syncope Gastrointestinal: negative for abdominal pain, constipation, diarrhea, melena, nausea and vomiting Genitourinary:negative for dysuria, frequency and hematuria Hematologic/lymphatic: negative for bleeding, easy bruising and lymphadenopathy Musculoskeletal:negative for arthralgias, muscle weakness and stiff joints Neurological: Pre-syncope Endocrine: negative for diabetic symptoms including polydipsia, polyuria and weight loss Allergic/Immunologic: negative for anaphylaxis, hay fever and urticaria   Past Medical History  Diagnosis Date  . Hypertension   . Hyperlipidemia   . Stroke   . Iliac artery aneurysm,  right   . Type II or unspecified type diabetes mellitus without mention of complication, not stated as uncontrolled   . Kidney stone   . CAD (coronary artery disease)    Past Surgical History  Procedure Date  . Tonsillectomy   . Tee without cardioversion 01/08/2012    Procedure: TRANSESOPHAGEAL ECHOCARDIOGRAM (TEE);  Surgeon: Lewayne Bunting, MD;  Location: [facility] ENDOSCOPY;  Service: Cardiovascular;  Laterality: N/A;   Social History:  reports that he has been smoking Cigars.  He has never used smokeless tobacco. He reports that he does not drink alcohol or use illicit drugs.  Allergies  Allergen Reactions  . Sulfa Drugs Cross Reactors Other (See Comments)    Unknown    Family History  Problem Relation Age of Onset  . Diabetes Mother     Prior to Admission medications   Medication Sig Start Date End Date Taking? Authorizing Provider  amLODipine (NORVASC) 10 MG tablet Take 10 mg by mouth daily.   Yes Historical Provider, MD  atorvastatin (LIPITOR) 40 MG tablet Take 40 mg by mouth daily at 6 PM. 01/08/12  Yes Cathlyn Parsons, PA-C  clopidogrel (PLAVIX) 75 MG tablet Take 75 mg by mouth daily with breakfast. 01/08/12  Yes Cathlyn Parsons, PA-C  lisinopril (PRINIVIL,ZESTRIL) 5 MG tablet Take 5 mg by mouth daily.   Yes Historical Provider, MD  pioglitazone (ACTOS) 30 MG tablet Take 30 mg by mouth daily.   Yes Historical Provider, MD   Physical Exam: Filed Vitals:   02/01/12 1430 02/01/12 1500 02/01/12 1530 02/01/12 1557  BP: 119/63 119/67 128/69 118/62  Pulse: 74 71 75   Temp:    97.7 F (36.5 C)  TempSrc:    Oral  Resp: 16 16 17 18   SpO2: 97% 98% 98% 98%    General appearance: alert, cooperative and no distress  Head: Normocephalic, without obvious abnormality, atraumatic  Eyes: conjunctivae/corneas clear. PERRL, EOM's intact. Fundi benign.  Nose: Nares normal. Septum midline. Mucosa normal. No drainage or sinus tenderness.  Throat: lips, mucosa, and tongue normal; teeth and gums  normal  Neck: Supple, no masses, no cervical lymphadenopathy, no JVD appreciated, no meningeal signs Resp: clear to auscultation bilaterally  Chest wall: no tenderness  Cardio: regular rate and rhythm, S1, S2 normal, no murmur, click, rub or gallop  GI: soft, non-tender; bowel sounds normal; no masses, no organomegaly  Extremities: extremities normal, atraumatic, no cyanosis or edema  Skin: Skin color, texture, turgor normal. No rashes or lesions  Neurologic: Alert and oriented X 3, normal strength and tone. Normal symmetric reflexes. Normal coordination and gait  Labs on Admission:  Basic Metabolic Panel:  Lab 02/01/12 1610  NA 140  K 4.2  CL 101  CO2 --  GLUCOSE 131*  BUN 19  CREATININE 1.10  CALCIUM --  MG --  PHOS --   Liver Function Tests: No results found for this basename: AST:5,ALT:5,ALKPHOS:5,BILITOT:5,PROT:5,ALBUMIN:5 in the last 168 hours No results found for this basename: LIPASE:5,AMYLASE:5 in the last 168 hours No results found for this basename: AMMONIA:5 in the last 168 hours CBC:  Lab 02/01/12 1339 02/01/12 1322  WBC -- 13.6*  NEUTROABS -- --  HGB 15.6 15.2  HCT 46.0 44.1  MCV -- 88.7  PLT -- 147*   Cardiac Enzymes: No results found for this basename: CKTOTAL:5,CKMB:5,CKMBINDEX:5,TROPONINI:5 in the last 168 hours  BNP (last 3 results) No results found for this basename: PROBNP:3 in the last 8760 hours CBG:  Lab 02/01/12 1315  GLUCAP 155*    Radiological Exams on Admission: No results found.  EKG: Independently reviewed.   Assessment/Plan Active Problems:  Carotid stenosis, right  Pre-syncope  H/O: CVA (cerebrovascular accident)  HTN (hypertension)  Type II or unspecified type diabetes mellitus without mention of complication, not stated as uncontrolled   Presyncope -Patient has history of multiple strokes, he is on Plavix since about 3 weeks ago. -I will check MRI and MRA of the head to rule out stroke/TIAs. -Patient has extensive  carotid (RICA 95% stenosis) and vertebral arteries disease, he is following with Dr. Hart [place]. -His carotid/vertebral artery disease can explain his presyncope if no MRI findings. -We will call vascular surgery if necessary in the morning.  History of CVA -Recent stroke, treated with tPA about 3 weeks ago. Check MRI to rule out new strokes. -Patient does not have any residual neurological deficits. He is on Plavix. -Previous MRI showed multiple anterior and posterior circulation strokes.  Hypertension -Blood pressure is reasonable, there was question about hypotension because of his blood pressure medication was restarted recently. -I will check orthostatic blood pressure, hydrate with IV fluids. -I will continue patient's amlodipine and lisinopril.  Diabetes mellitus type 2 -Controlled, with hemoglobin A1c of 6.7 on 01/06/2012.  Code Status: Full Family Communication: His daughter is at bedside. Disposition Plan: Tele, observation  Time spent: 70 minutes  [facility] Pager [phone]  If 7PM-7AM, please contact night-coverage [email or web address] Password [facility] 02/01/2012, 4:46 PM

## 2012-02-01 NOTE — ED Notes (Signed)
CBG of 155 completed.

## 2012-02-01 NOTE — ED Notes (Addendum)
Per EMS pt was at a diner and was ordering lunch when he had a syncopal episode while sitting on a stool. He fell to the ground and was unresponsive for about a minute according to bystanders. C/o of being hot, pt was diaphoretic and had nausea prior to arrival. B/p 148/70, p 80, 97% RA, Resp 16, CBG 112. Was given 4mg  Zofran by ems. Pt was seen a month ago for similar symptoms but experienced rt sided weakness and was treated with TPA. Hx of HTN, Hyperlipidemia, and CVA. Pt States he has 95% blockage in rt carotid artery. Scheduled for surgery on next Tuesday.

## 2012-02-01 NOTE — ED Notes (Signed)
Pt switched to transport monitor. Hospitalist at the bedside.

## 2012-02-01 NOTE — ED Notes (Signed)
Dr. Jacubowitz at bedside 

## 2012-02-01 NOTE — ED Provider Notes (Signed)
History     CSN: [id number]  Arrival date & time 02/01/12  1238   First MD Initiated Contact with Patient 02/01/12 1305      Chief Complaint  Patient presents with  . Loss of Consciousness    (Consider location/radiation/quality/duration/timing/severity/associated sxs/prior treatment) HPI Patient had syncopal event while eating lunch today uncertain how long he was unconscious. Presently complains of generalized weakness denies other complaint denies headache denies chest pain denies breath no treatment prior to coming here no other associated symptoms Past Medical History  Diagnosis Date  . Hypertension   . Hyperlipidemia   . Stroke   . Iliac artery aneurysm, right   . Diabetes mellitus   . Kidney stone   . CAD (coronary artery disease)     Past Surgical History  Procedure Date  . Tonsillectomy   . Tee without cardioversion 01/08/2012    Procedure: TRANSESOPHAGEAL ECHOCARDIOGRAM (TEE);  Surgeon: Lewayne Bunting, MD;  Location: [facility] ENDOSCOPY;  Service: Cardiovascular;  Laterality: N/A;    Family History  Problem Relation Age of Onset  . Diabetes Mother     History  Substance Use Topics  . Smoking status: Current Every Day Smoker -- 0.5 packs/day for 30 years    Types: Cigars  . Smokeless tobacco: Never Used  . Alcohol Use: No     quit 2 months      Review of Systems  Neurological: Positive for weakness.    Allergies  Sulfa drugs cross reactors  Home Medications   Current Outpatient Rx  Name Route Sig Dispense Refill  . STROKE: MAPPING OUR EARLY STAGES OF RECOVERY BOOK Does not apply 1 each by Does not apply route once. 1 each 1  . AMLODIPINE BESYLATE 10 MG PO TABS Oral Take 10 mg by mouth daily.    . ATORVASTATIN CALCIUM 40 MG PO TABS Oral Take 1 tablet (40 mg total) by mouth daily at 6 PM. 30 tablet 2  . CLOPIDOGREL BISULFATE 75 MG PO TABS Oral Take 1 tablet (75 mg total) by mouth daily with breakfast. 30 tablet 2    Temp 97.5 F (36.4 C)  SpO2  97%  Physical Exam  Nursing note and vitals reviewed. Constitutional: He appears well-developed and well-nourished.  HENT:  Head: Normocephalic and atraumatic.  Eyes: Conjunctivae normal are normal. Pupils are equal, round, and reactive to light.  Neck: Neck supple. No tracheal deviation present. No thyromegaly present.  Cardiovascular: Normal rate and regular rhythm.   No murmur heard. Pulmonary/Chest: Effort normal and breath sounds normal.  Abdominal: Soft. Bowel sounds are normal. He exhibits no distension. There is no tenderness.  Musculoskeletal: Normal range of motion. He exhibits no edema and no tenderness.  Neurological: He is alert. Coordination normal.  Skin: Skin is warm and dry. No rash noted.  Psychiatric: He has a normal mood and affect.    ED Course  Procedures (including critical care time)  Labs Reviewed  GLUCOSE, CAPILLARY - Abnormal; Notable for the following:    Glucose-Capillary 155 (*)     All other components within normal limits  CBC   No results found.   No diagnosis found.  Date: 02/01/2012  Rate: 80  Rhythm: normal sinus rhythm  QRS Axis: normal  Intervals: normal  ST/T Wave abnormalities: normal and nonspecific T wave changes  Conduction Disutrbances:none  Narrative Interpretation:   Old EKG Reviewed: Sinus tachycardia from 01/05/2012 has resolved otherwise unchanged Results for orders placed during the hospital encounter of 02/01/12  CBC  Component Value Range   WBC 13.6 (*) 4.0 - 10.5 K/uL   RBC 4.97  4.22 - 5.81 MIL/uL   Hemoglobin 15.2  13.0 - 17.0 g/dL   HCT 16.1  09.6 - 04.5 %   MCV 88.7  78.0 - 100.0 fL   MCH 30.6  26.0 - 34.0 pg   MCHC 34.5  30.0 - 36.0 g/dL   RDW 40.9  81.1 - 91.4 %   Platelets 147 (*) 150 - 400 K/uL  GLUCOSE, CAPILLARY      Component Value Range   Glucose-Capillary 155 (*) 70 - 99 mg/dL   Comment 1 Notify RN     Comment 2 Documented in Chart    POCT I-STAT, CHEM 8      Component Value Range    Sodium 140  135 - 145 mEq/L   Potassium 4.2  3.5 - 5.1 mEq/L   Chloride 101  96 - 112 mEq/L   BUN 19  6 - 23 mg/dL   Creatinine, Ser 7.82  0.50 - 1.35 mg/dL   Glucose, Bld 956 (*) 70 - 99 mg/dL   Calcium, Ion 2.13  0.86 - 1.30 mmol/L   TCO2 25  0 - 100 mmol/L   Hemoglobin 15.6  13.0 - 17.0 g/dL   HCT 57.8  46.9 - 62.9 %  POCT I-STAT TROPONIN I      Component Value Range   Troponin i, poc 0.01  0.00 - 0.08 ng/mL   Comment 3            Ct Angio Head W/cm &/or Wo Cm  01/05/2012  *RADIOLOGY REPORT*  Clinical Data:  Code stroke.  Left MCA territory.  CT ANGIOGRAPHY HEAD AND NECK  Technique:  Multidetector CT imaging of the head and neck was performed using the standard protocol during bolus administration of intravenous contrast.  Multiplanar CT image reconstructions including MIPs were obtained to evaluate the vascular anatomy. Carotid stenosis measurements (when applicable) are obtained utilizing NASCET criteria, using the distal internal carotid diameter as the denominator.  Contrast: 50mL OMNIPAQUE IOHEXOL 350 MG/ML SOLN  Comparison:  Head CT same day  CTA NECK  Findings:  Lung apices are clear except for mild scarring.  No superior mediastinal pathology.  There is atherosclerosis of the aorta.  Branching pattern of brachiocephalic vessels is normal.  No origin stenoses.  The right common carotid artery is widely patent to the bifurcation.  There is severe stenosis of the proximal ICA with a string sign.  This consistent with 95% or greater stenosis.  Beyond that, the cervical internal carotid artery shows flow but is markedly diminished in caliber because of the reduced inflow.  The left common carotid arteries widely patent to the bifurcation. There is atherosclerotic disease at the carotid bifurcation. Minimal diameter of the proximal internal carotid artery is 4 mm. Compared to a more distal cervical ICA diameter of 5 mm, this indicates a 20% stenosis.  There is atherosclerosis of the right  vertebral artery origin. This is a small vessel.  Therefore, the stenosis is difficult to accurately measure.  I estimate this at 50%.  The left vertebral artery is also a small vessel.  There does not appear to be any stenosis at its origin.  Both vertebral arteries, though a small, are patent through the cervical region to the foramen magnum.  The soft tissues of the neck are unremarkable except for the thyroid nodule the lower pole on the left measuring 2 cm in diameter.  Ultrasound would be suggested to evaluate this if clinically appropriate.   Review of the MIP images confirms the above findings.  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  Beyond that, the vessel is markedly narrowed because of diminished inflow.  20% stenosis of the proximal left internal carotid artery.  Both vertebral arteries are diminutive vessels but are patent through the neck.  I estimate there is a 50% stenosis of the proximal right vertebral artery.  CTA HEAD  Findings:  Both internal carotid arteries are patent through the skull base, with the right being a diminutive vessel because of reduced inflow.  There is extensive atherosclerotic calcification in both carotid siphon regions.  Stenosis in that region is estimated at 50-70% bilaterally.  The right anterior and middle cerebral vessels are patent without proximal stenosis, aneurysm or vascular malformation.  The left anterior and middle cerebral vessels are patent.  The M1 segment shows pronounced irregularity.  Stenosis at the M1 origin is estimated at 50%.  I do not see an intraluminal filling defect at this moment however.  No missing distal branches are appreciated.  The right vertebral artery is a small vessel that appears to be patent to the basilar.  The left vertebral artery is a small vessel patent to the foramen magnum but without demonstrable flow beyond that.  There is reconstitution or retrograde flow within the distal left  vertebral.  The basilar artery is a small vessel but does show flow.  Both posterior cerebral arteries receive there supply from the anterior circulation.  The brain shows old small vessel infarctions within the deep white matter.  There is no identifiable acute infarction.  No intracranial hemorrhage.   Review of the MIP images confirms the above findings.  IMPRESSION: The right internal carotid artery is diminutive because of reduced inflow.  Both internal carotid arteries are patent through the siphon region.  There is extensive calcific atherosclerosis of both carotid siphons with stenoses on the order of 50-70% bilaterally.  Both anterior and middle cerebral vessels are patent.  No evidence of intraluminal filling defect.  There is irregularity of the M1 segment on the left with stenosis of the left M1 segment of 50%. No missing distal branch vessels are identified.  Both vertebral arteries are diminutive.  The right vertebral artery is patent to the basilar.  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  There is reconstituted or retrograde flow in the distal left vertebral artery.  The basilar artery is a very diminutive vessel but does show flow.  This vessel supplies both superior cerebellar arteries.  Both posterior cerebral arteries receive most to their supply from the anterior circulation.   Original Report Authenticated By: Thomasenia Sales, M.D.    Ct Head Wo Contrast  01/05/2012  *RADIOLOGY REPORT*  Clinical Data: Code stroke.  Right-sided weakness.  Right facial droop.  CT HEAD WITHOUT CONTRAST  Technique:  Contiguous axial images were obtained from the base of the skull through the vertex without contrast.  Comparison: 06/23/2008  Findings: The cerebellum, brain stem, cerebral peduncles, and thalami appear normal.  A small remote lacunar infarct involving the anterior limb of the left internal capsule and head of the left caudate nucleus is observed.  There is also a small  lacunar infarct in the head of the right caudate nucleus appears old.  The basal ganglia appear otherwise unremarkable.  Ventricular system and basilar cisterns unremarkable.  Mild chronic left  maxillary sinusitis noted. No intracranial hemorrhage, mass lesion, or acute infarction is identified.  IMPRESSION:  1.  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 No acute intracranial findings. 2.  Mild chronic left maxillary sinusitis.  I discussed these findings by telephone with Dr. Viviann Spare Rancour at 6:11 p.m. on 01/05/2012.   Original Report Authenticated By: Dellia Cloud, M.D.    Ct Angio Neck W/cm &/or Wo/cm  01/05/2012  *RADIOLOGY REPORT*  Clinical Data:  Code stroke.  Left MCA territory.  CT ANGIOGRAPHY HEAD AND NECK  Technique:  Multidetector CT imaging of the head and neck was performed using the standard protocol during bolus administration of intravenous contrast.  Multiplanar CT image reconstructions including MIPs were obtained to evaluate the vascular anatomy. Carotid stenosis measurements (when applicable) are obtained utilizing NASCET criteria, using the distal internal carotid diameter as the denominator.  Contrast: 50mL OMNIPAQUE IOHEXOL 350 MG/ML SOLN  Comparison:  Head CT same day  CTA NECK  Findings:  Lung apices are clear except for mild scarring.  No superior mediastinal pathology.  There is atherosclerosis of the aorta.  Branching pattern of brachiocephalic vessels is normal.  No origin stenoses.  The right common carotid artery is widely patent to the bifurcation.  There is severe stenosis of the proximal ICA with a string sign.  This consistent with 95% or greater stenosis.  Beyond that, the cervical internal carotid artery shows flow but is markedly diminished in caliber because of the reduced inflow.  The left common carotid arteries widely patent to the bifurcation. There is atherosclerotic disease at  the carotid bifurcation. Minimal diameter of the proximal internal carotid artery is 4 mm. Compared to a more distal cervical ICA diameter of 5 mm, this indicates a 20% stenosis.  There is atherosclerosis of the right vertebral artery origin. This is a small vessel.  Therefore, the stenosis is difficult to accurately measure.  I estimate this at 50%.  The left vertebral artery is also a small vessel.  There does not appear to be any stenosis at its origin.  Both vertebral arteries, though a small, are patent through the cervical region to the foramen magnum.  The soft tissues of the neck are unremarkable except for the thyroid nodule the lower pole on the left measuring 2 cm in diameter.  Ultrasound would be suggested to evaluate this if clinically appropriate.   Review of the MIP images confirms the above findings.  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  Beyond that, the vessel is markedly narrowed because of diminished inflow.  20% stenosis of the proximal left internal carotid artery.  Both vertebral arteries are diminutive vessels but are patent through the neck.  I estimate there is a 50% stenosis of the proximal right vertebral artery.  CTA HEAD  Findings:  Both internal carotid arteries are patent through the skull base, with the right being a diminutive vessel because of reduced inflow.  There is extensive atherosclerotic calcification in both carotid siphon regions.  Stenosis in that region is estimated at 50-70% bilaterally.  The right anterior and middle cerebral vessels are patent without proximal stenosis, aneurysm or vascular malformation.  The left anterior and middle cerebral vessels are patent.  The M1 segment shows pronounced irregularity.  Stenosis at the M1 origin is estimated at 50%.  I do not see an intraluminal filling defect at this moment however.  No  missing distal branches are appreciated.  The right vertebral artery is a small vessel that  appears to be patent to the basilar.  The left vertebral artery is a small vessel patent to the foramen magnum but without demonstrable flow beyond that.  There is reconstitution or retrograde flow within the distal left vertebral.  The basilar artery is a small vessel but does show flow.  Both posterior cerebral arteries receive there supply from the anterior circulation.  The brain shows old small vessel infarctions within the deep white matter.  There is no identifiable acute infarction.  No intracranial hemorrhage.   Review of the MIP images confirms the above findings.  IMPRESSION: The right internal carotid artery is diminutive because of reduced inflow.  Both internal carotid arteries are patent through the siphon region.  There is extensive calcific atherosclerosis of both carotid siphons with stenoses on the order of 50-70% bilaterally.  Both anterior and middle cerebral vessels are patent.  No evidence of intraluminal filling defect.  There is irregularity of the M1 segment on the left with stenosis of the left M1 segment of 50%. No missing distal branch vessels are identified.  Both vertebral arteries are diminutive.  The right vertebral artery is patent to the basilar.  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  There is reconstituted or retrograde flow in the distal left vertebral artery.  The basilar artery is a very diminutive vessel but does show flow.  This vessel supplies both superior cerebellar arteries.  Both posterior cerebral arteries receive most to their supply from the anterior circulation.   Original Report Authenticated By: Thomasenia Sales, M.D.    Mr Brain Wo Contrast  01/06/2012  *RADIOLOGY REPORT*  Clinical Data: The patient treated for stroke and left middle cerebral artery territory.  Follow-up.  MRI HEAD WITHOUT CONTRAST  Technique:  Multiplanar, multiecho pulse sequences of the brain and surrounding structures were obtained according to standard  protocol without intravenous contrast.  Comparison: CT and CT angiography 01/05/2012  Findings: Diffusion imaging shows a cluster of sub-centimeter infarctions within the anterior to mid cerebellum on the left peripherally.  There is no significant swelling.  No hemorrhage or mass effect.  There are a few old small vessel infarctions in the right cerebellum.  The pons appears normal.  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  There is some residual hemosiderin in the area of the basal ganglia infarctions.  No large vessel territory infarction.  No mass lesion, hydrocephalus or extra-axial collection.  No pituitary mass.  No significant sinus disease. There is a small amount of fluid in the mastoid air cells.  No skull or skull base lesion.  IMPRESSION: Cluster of sub-centimeter acute infarctions within the periphery of the lower to mid left cerebellar hemisphere.  Several old small vessel cerebellar infarctions, several old lacunar infarctions within the basal ganglia and moderate chronic small vessel changes in the hemispheric white matter.   Original Report Authenticated By: Thomasenia Sales, M.D.    Dg Chest Port 1 View  01/05/2012   *RADIOLOGY REPORT*  Clinical Data: Hypertension, possible aspiration  PORTABLE CHEST - 1 VIEW  Comparison: 10/28/2009  Findings: Lungs clear.  Heart size and pulmonary vascularity normal.  No effusion.  Visualized bones unremarkable.  IMPRESSION: No acute disease   Original Report Authenticated By: Osa Craver, M.D.  3:25 PM patient alert Glasgow Coma Score 15  MDM  335 pm spoke with Dr. Arthor Captain Plan  23 hour observation, telemetry Dx syncope        Doug Sou, MD 02/01/12 1537

## 2012-02-02 ENCOUNTER — Observation Stay (HOSPITAL_COMMUNITY): Payer: Medicare Other

## 2012-02-02 DIAGNOSIS — R55 Syncope and collapse: Secondary | ICD-10-CM | POA: Diagnosis not present

## 2012-02-02 DIAGNOSIS — I6529 Occlusion and stenosis of unspecified carotid artery: Secondary | ICD-10-CM | POA: Diagnosis not present

## 2012-02-02 DIAGNOSIS — I6350 Cerebral infarction due to unspecified occlusion or stenosis of unspecified cerebral artery: Secondary | ICD-10-CM | POA: Diagnosis not present

## 2012-02-02 DIAGNOSIS — I10 Essential (primary) hypertension: Secondary | ICD-10-CM | POA: Diagnosis not present

## 2012-02-02 DIAGNOSIS — Z8673 Personal history of transient ischemic attack (TIA), and cerebral infarction without residual deficits: Secondary | ICD-10-CM | POA: Diagnosis not present

## 2012-02-02 DIAGNOSIS — E119 Type 2 diabetes mellitus without complications: Secondary | ICD-10-CM | POA: Diagnosis not present

## 2012-02-02 LAB — GLUCOSE, CAPILLARY: Glucose-Capillary: 144 mg/dL — ABNORMAL HIGH (ref 70–99)

## 2012-02-02 LAB — BASIC METABOLIC PANEL
CO2: 24 mEq/L (ref 19–32)
Calcium: 8.7 mg/dL (ref 8.4–10.5)
Chloride: 105 mEq/L (ref 96–112)
Creatinine, Ser: 0.75 mg/dL (ref 0.50–1.35)
GFR calc Af Amer: >90 mL/min (ref >90)
GFR calc non Af Amer: >90 mL/min (ref >90)
Glucose, Bld: 127 mg/dL — ABNORMAL HIGH (ref 70–99)

## 2012-02-02 LAB — HEMOGLOBIN A1C
Hgb A1c MFr Bld: 6.4 % — ABNORMAL HIGH (ref <5.7)
Mean Plasma Glucose: 137 mg/dL — ABNORMAL HIGH (ref <117)

## 2012-02-02 LAB — CBC
Hemoglobin: 14.1 g/dL (ref 13.0–17.0)
MCHC: 33.6 g/dL (ref 30.0–36.0)
Platelets: 149 10*3/uL — ABNORMAL LOW (ref 150–400)
RBC: 4.73 MIL/uL (ref 4.22–5.81)

## 2012-02-02 LAB — TROPONIN I: Troponin I: <0.3 ng/mL (ref <0.30)

## 2012-02-02 LAB — TSH: TSH: 0.95 u[IU]/mL (ref 0.350–4.500)

## 2012-02-02 NOTE — Progress Notes (Signed)
UR COMPLETED.   Skyler Dusing Wise Fallen Crisostomo, RN, BSN    Phone #[phone] 

## 2012-02-02 NOTE — Progress Notes (Signed)
Given the findings of new infarct I will postpone NUC stress test until it is felt to be safe perform.

## 2012-02-02 NOTE — Progress Notes (Signed)
Subjective: Interval History: The patient was admitted yesterday with recurrent neurologic events. I have reviewed his MR in MRA and CT angiogram from her admission one month ago. Also reviewed his medical records from his October admission. At that time he had an episode of aphasia and right-sided paralysis. Workup included CT angiogram which showed 95% right internal carotid artery stenosis with a very small internal carotid artery above the bifurcation and significant intracranial disease on the right. He had minimal disease in his left internal carotid artery. He had very small vertebral and basilar artery. He did receive TPA and had complete resolution. He was seen at that time and consultation with Dr. Betti Cruz who felt that he would require right endarterectomy due to his critical stenosis after approximately one month resolution from his acute left brain stroke. There patient reports that yesterday while at a restaurant he had an episode where he began to sweat and felt very flushed and faint and hot. He reports that for approximately 30 seconds he was a very faint. This resolved very quickly and he was able to walk without difficulty following the procedure and had no focal deficits. He was admitted back to the hospital and MRI shows a new right posterior frontal subcortical white matter stroke. I was asked to see him for further discussion.  Objective: Vital signs in last 24 hours: Temp:  [97.4 F (36.3 C)-98.2 F (36.8 C)] 97.4 F (36.3 C) (11/02 0645) Pulse Rate:  [88-97] 97  (11/02 0645) Resp:  [19] 19  (11/02 0645) BP: (118-129)/(52-54) 129/54 mmHg (11/02 0645) SpO2:  [93 %-99 %] 93 % (11/02 0645)  Intake/Output from previous day:   Intake/Output this shift: Total I/O In: 480 [P.O.:480] Out: -   Alert oriented completely intact neurologically. Normal carotid pulsations bilaterally Respirations nonlabored Skin without ulcers or rashes Palpable radial pulses bilaterally  Lab  Results:  Basename 02/02/12 0620 02/01/12 1339 02/01/12 1322  WBC 9.4 -- 13.6*  HGB 14.1 15.6 --  HCT 42.0 46.0 --  PLT 149* -- 147*   BMET  Basename 02/02/12 0620 02/01/12 1339  NA 138 140  K 4.4 4.2  CL 105 101  CO2 24 --  GLUCOSE 127* 131*  BUN 14 19  CREATININE 0.75 1.10  CALCIUM 8.7 --    Studies/Results: Ct Angio Head W/cm &/or Wo Cm  01/05/2012  *RADIOLOGY REPORT*  Clinical Data:  Code stroke.  Left MCA territory.  CT ANGIOGRAPHY HEAD AND NECK  Technique:  Multidetector CT imaging of the head and neck was performed using the standard protocol during bolus administration of intravenous contrast.  Multiplanar CT image reconstructions including MIPs were obtained to evaluate the vascular anatomy. Carotid stenosis measurements (when applicable) are obtained utilizing NASCET criteria, using the distal internal carotid diameter as the denominator.  Contrast: 50mL OMNIPAQUE IOHEXOL 350 MG/ML SOLN  Comparison:  Head CT same day  CTA NECK  Findings:  Lung apices are clear except for mild scarring.  No superior mediastinal pathology.  There is atherosclerosis of the aorta.  Branching pattern of brachiocephalic vessels is normal.  No origin stenoses.  The right common carotid artery is widely patent to the bifurcation.  There is severe stenosis of the proximal ICA with a string sign.  This consistent with 95% or greater stenosis.  Beyond that, the cervical internal carotid artery shows flow but is markedly diminished in caliber because of the reduced inflow.  The left common carotid arteries widely patent to the bifurcation. There is atherosclerotic  disease at the carotid bifurcation. Minimal diameter of the proximal internal carotid artery is 4 mm. Compared to a more distal cervical ICA diameter of 5 mm, this indicates a 20% stenosis.  There is atherosclerosis of the right vertebral artery origin. This is a small vessel.  Therefore, the stenosis is difficult to accurately measure.  I estimate  this at 50%.  The left vertebral artery is also a small vessel.  There does not appear to be any stenosis at its origin.  Both vertebral arteries, though a small, are patent through the cervical region to the foramen magnum.  The soft tissues of the neck are unremarkable except for the thyroid nodule the lower pole on the left measuring 2 cm in diameter.  Ultrasound would be suggested to evaluate this if clinically appropriate.   Review of the MIP images confirms the above findings.  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  Beyond that, the vessel is markedly narrowed because of diminished inflow.  20% stenosis of the proximal left internal carotid artery.  Both vertebral arteries are diminutive vessels but are patent through the neck.  I estimate there is a 50% stenosis of the proximal right vertebral artery.  CTA HEAD  Findings:  Both internal carotid arteries are patent through the skull base, with the right being a diminutive vessel because of reduced inflow.  There is extensive atherosclerotic calcification in both carotid siphon regions.  Stenosis in that region is estimated at 50-70% bilaterally.  The right anterior and middle cerebral vessels are patent without proximal stenosis, aneurysm or vascular malformation.  The left anterior and middle cerebral vessels are patent.  The M1 segment shows pronounced irregularity.  Stenosis at the M1 origin is estimated at 50%.  I do not see an intraluminal filling defect at this moment however.  No missing distal branches are appreciated.  The right vertebral artery is a small vessel that appears to be patent to the basilar.  The left vertebral artery is a small vessel patent to the foramen magnum but without demonstrable flow beyond that.  There is reconstitution or retrograde flow within the distal left vertebral.  The basilar artery is a small vessel but does show flow.  Both posterior cerebral arteries receive there  supply from the anterior circulation.  The brain shows old small vessel infarctions within the deep white matter.  There is no identifiable acute infarction.  No intracranial hemorrhage.   Review of the MIP images confirms the above findings.  IMPRESSION: The right internal carotid artery is diminutive because of reduced inflow.  Both internal carotid arteries are patent through the siphon region.  There is extensive calcific atherosclerosis of both carotid siphons with stenoses on the order of 50-70% bilaterally.  Both anterior and middle cerebral vessels are patent.  No evidence of intraluminal filling defect.  There is irregularity of the M1 segment on the left with stenosis of the left M1 segment of 50%. No missing distal branch vessels are identified.  Both vertebral arteries are diminutive.  The right vertebral artery is patent to the basilar.  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  There is reconstituted or retrograde flow in the distal left vertebral artery.  The basilar artery is a very diminutive vessel but does show flow.  This vessel supplies both superior cerebellar arteries.  Both posterior cerebral arteries receive most to their supply from the anterior circulation.   Original Report Authenticated By:  Thomasenia Sales, M.D.    Ct Head Wo Contrast  01/05/2012  *RADIOLOGY REPORT*  Clinical Data: Code stroke.  Right-sided weakness.  Right facial droop.  CT HEAD WITHOUT CONTRAST  Technique:  Contiguous axial images were obtained from the base of the skull through the vertex without contrast.  Comparison: 06/23/2008  Findings: The cerebellum, brain stem, cerebral peduncles, and thalami appear normal.  A small remote lacunar infarct involving the anterior limb of the left internal capsule and head of the left caudate nucleus is observed.  There is also a small lacunar infarct in the head of the right caudate nucleus appears old.  The basal ganglia appear otherwise  unremarkable.  Ventricular system and basilar cisterns unremarkable.  Mild chronic left maxillary sinusitis noted. No intracranial hemorrhage, mass lesion, or acute infarction is identified.  IMPRESSION:  1.  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 No acute intracranial findings. 2.  Mild chronic left maxillary sinusitis.  I discussed these findings by telephone with Dr. Viviann Spare Rancour at 6:11 p.m. on 01/05/2012.   Original Report Authenticated By: Dellia Cloud, M.D.    Ct Angio Neck W/cm &/or Wo/cm  01/05/2012  *RADIOLOGY REPORT*  Clinical Data:  Code stroke.  Left MCA territory.  CT ANGIOGRAPHY HEAD AND NECK  Technique:  Multidetector CT imaging of the head and neck was performed using the standard protocol during bolus administration of intravenous contrast.  Multiplanar CT image reconstructions including MIPs were obtained to evaluate the vascular anatomy. Carotid stenosis measurements (when applicable) are obtained utilizing NASCET criteria, using the distal internal carotid diameter as the denominator.  Contrast: 50mL OMNIPAQUE IOHEXOL 350 MG/ML SOLN  Comparison:  Head CT same day  CTA NECK  Findings:  Lung apices are clear except for mild scarring.  No superior mediastinal pathology.  There is atherosclerosis of the aorta.  Branching pattern of brachiocephalic vessels is normal.  No origin stenoses.  The right common carotid artery is widely patent to the bifurcation.  There is severe stenosis of the proximal ICA with a string sign.  This consistent with 95% or greater stenosis.  Beyond that, the cervical internal carotid artery shows flow but is markedly diminished in caliber because of the reduced inflow.  The left common carotid arteries widely patent to the bifurcation. There is atherosclerotic disease at the carotid bifurcation. Minimal diameter of the proximal internal carotid artery is 4 mm. Compared to a  more distal cervical ICA diameter of 5 mm, this indicates a 20% stenosis.  There is atherosclerosis of the right vertebral artery origin. This is a small vessel.  Therefore, the stenosis is difficult to accurately measure.  I estimate this at 50%.  The left vertebral artery is also a small vessel.  There does not appear to be any stenosis at its origin.  Both vertebral arteries, though a small, are patent through the cervical region to the foramen magnum.  The soft tissues of the neck are unremarkable except for the thyroid nodule the lower pole on the left measuring 2 cm in diameter.  Ultrasound would be suggested to evaluate this if clinically appropriate.   Review of the MIP images confirms the above findings.  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  Beyond that, the vessel is markedly narrowed because of diminished inflow.  20% stenosis of the proximal left internal carotid artery.  Both vertebral arteries are diminutive  vessels but are patent through the neck.  I estimate there is a 50% stenosis of the proximal right vertebral artery.  CTA HEAD  Findings:  Both internal carotid arteries are patent through the skull base, with the right being a diminutive vessel because of reduced inflow.  There is extensive atherosclerotic calcification in both carotid siphon regions.  Stenosis in that region is estimated at 50-70% bilaterally.  The right anterior and middle cerebral vessels are patent without proximal stenosis, aneurysm or vascular malformation.  The left anterior and middle cerebral vessels are patent.  The M1 segment shows pronounced irregularity.  Stenosis at the M1 origin is estimated at 50%.  I do not see an intraluminal filling defect at this moment however.  No missing distal branches are appreciated.  The right vertebral artery is a small vessel that appears to be patent to the basilar.  The left vertebral artery is a small vessel patent to the foramen  magnum but without demonstrable flow beyond that.  There is reconstitution or retrograde flow within the distal left vertebral.  The basilar artery is a small vessel but does show flow.  Both posterior cerebral arteries receive there supply from the anterior circulation.  The brain shows old small vessel infarctions within the deep white matter.  There is no identifiable acute infarction.  No intracranial hemorrhage.   Review of the MIP images confirms the above findings.  IMPRESSION: The right internal carotid artery is diminutive because of reduced inflow.  Both internal carotid arteries are patent through the siphon region.  There is extensive calcific atherosclerosis of both carotid siphons with stenoses on the order of 50-70% bilaterally.  Both anterior and middle cerebral vessels are patent.  No evidence of intraluminal filling defect.  There is irregularity of the M1 segment on the left with stenosis of the left M1 segment of 50%. No missing distal branch vessels are identified.  Both vertebral arteries are diminutive.  The right vertebral artery is patent to the basilar.  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  There is reconstituted or retrograde flow in the distal left vertebral artery.  The basilar artery is a very diminutive vessel but does show flow.  This vessel supplies both superior cerebellar arteries.  Both posterior cerebral arteries receive most to their supply from the anterior circulation.   Original Report Authenticated By: Thomasenia Sales, M.D.    Mr Maxine Glenn Head Wo Contrast  02/02/2012  *RADIOLOGY REPORT*  Clinical Data:  Syncopal episode.  Recent stroke.  MRI HEAD WITHOUT CONTRAST MRA HEAD WITHOUT CONTRAST  Technique:  Multiplanar, multiecho pulse sequences of the brain and surrounding structures were obtained without intravenous contrast. Angiographic images of the head were obtained using MRA technique without contrast.  Comparison:  MRI head January 06, 2012.   CT angiography of the neck and head 01/05/2012  MRI HEAD  Findings:  There is a new cluster of acute subcentimeter infarcts affecting the right posterior frontal subcortical white matter, not present previously.  Previous left cerebellar stroke is resolving. Baseline atrophy with chronic microvascular ischemic change noted. Remote infarcts in the right cerebellum and basal ganglia bilaterally.  Moderate chronic small vessel ischemic change throughout the cerebral hemispheric white matter.  Chronic hemorrhage is associated with the bilateral basal ganglia infarcts. No large vessel cortical infarct.  No mass lesion, hydrocephalus, or acute hemorrhage.  No extra-axial fluid.  Midline structures unremarkable.  No acute sinus or mastoid disease.  Calvarium and skull base  intact.  There is diminished flow related enhancement in the right internal carotid artery at the skull base.  This was present previously and will be described further on MRA.  Prior MRI brain did not show an acute right hemisphere infarct.  IMPRESSION: New areas of acute subcentimeter infarction affecting the right posterior frontal subcortical white matter not present previously. It is unclear if this relates to hypoperfusion of the right hemisphere, small vessel thrombosis, or an embolic phenomenon.  Diminished flow related enhancement right internal carotid artery.  MRA HEAD  Findings: There is poor flow related enhancement in the cervical, petrous, cavernous, and supraclinoid right internal carotid artery. There is enough flow related enhancement on the right to suggest the findings represent slow flow rather than thrombosis although confirmation of this would require formal catheter angiogram. The findings are suggestive of a so called "string sign".  The left internal carotid artery shows no focal irregularity or flow limiting stenosis  in the cervical, petrous, cavernous region. There is a estimated 50% percent stenosis of the supraclinoid ICA  on the left.  Diminished flow related enhancement in the proximal right anterior cerebral artery and proximal left middle cerebral artery.  There is likely collateral flow from left to right augmenting the right anterior circulation.  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  Both posterior cerebral arteries originate from the carotids accounting for the hypoplasia.  The vertebral arteries are roughly equal size in the neck.  There is a focal occlusion of the left vertebral artery at the skull base with some reconstitution distally, but this vessel does not significantly contribute to basilar flow.  The right vertebral caliber decreases distal to the right PICA origin although no flow limiting stenosis is definitely observed.  IMPRESSION: Severe proximal stenosis right internal carotid artery with suspected string sign.  Collateral flow from left to right likely preserves  flow to the right anterior circulation.  Vertebrobasilar atherosclerotic change as described.  The MRA appearance is similar to that of previous CTA head/neck from 01/05/2012.   Original Report Authenticated By: Davonna Belling, M.D.    Mr Brain Wo Contrast  02/02/2012  *RADIOLOGY REPORT*  Clinical Data:  Syncopal episode.  Recent stroke.  MRI HEAD WITHOUT CONTRAST MRA HEAD WITHOUT CONTRAST  Technique:  Multiplanar, multiecho pulse sequences of the brain and surrounding structures were obtained without intravenous contrast. Angiographic images of the head were obtained using MRA technique without contrast.  Comparison:  MRI head January 06, 2012.  CT angiography of the neck and head 01/05/2012  MRI HEAD  Findings:  There is a new cluster of acute subcentimeter infarcts affecting the right posterior frontal subcortical white matter, not present previously.  Previous left cerebellar stroke is resolving. Baseline atrophy with chronic microvascular ischemic change noted. Remote infarcts in the right cerebellum and basal  ganglia bilaterally.  Moderate chronic small vessel ischemic change throughout the cerebral hemispheric white matter.  Chronic hemorrhage is associated with the bilateral basal ganglia infarcts. No large vessel cortical infarct.  No mass lesion, hydrocephalus, or acute hemorrhage.  No extra-axial fluid.  Midline structures unremarkable.  No acute sinus or mastoid disease.  Calvarium and skull base intact.  There is diminished flow related enhancement in the right internal carotid artery at the skull base.  This was present previously and will be described further on MRA.  Prior MRI brain did not show an acute right hemisphere infarct.  IMPRESSION: New areas of acute subcentimeter infarction affecting the right  posterior frontal subcortical white matter not present previously. It is unclear if this relates to hypoperfusion of the right hemisphere, small vessel thrombosis, or an embolic phenomenon.  Diminished flow related enhancement right internal carotid artery.  MRA HEAD  Findings: There is poor flow related enhancement in the cervical, petrous, cavernous, and supraclinoid right internal carotid artery. There is enough flow related enhancement on the right to suggest the findings represent slow flow rather than thrombosis although confirmation of this would require formal catheter angiogram. The findings are suggestive of a so called "string sign".  The left internal carotid artery shows no focal irregularity or flow limiting stenosis  in the cervical, petrous, cavernous region. There is a estimated 50% percent stenosis of the supraclinoid ICA on the left.  Diminished flow related enhancement in the proximal right anterior cerebral artery and proximal left middle cerebral artery.  There is likely collateral flow from left to right augmenting the right anterior circulation.  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  Both posterior cerebral arteries originate from the carotids  accounting for the hypoplasia.  The vertebral arteries are roughly equal size in the neck.  There is a focal occlusion of the left vertebral artery at the skull base with some reconstitution distally, but this vessel does not significantly contribute to basilar flow.  The right vertebral caliber decreases distal to the right PICA origin although no flow limiting stenosis is definitely observed.  IMPRESSION: Severe proximal stenosis right internal carotid artery with suspected string sign.  Collateral flow from left to right likely preserves  flow to the right anterior circulation.  Vertebrobasilar atherosclerotic change as described.  The MRA appearance is similar to that of previous CTA head/neck from 01/05/2012.   Original Report Authenticated By: Davonna Belling, M.D.    Mr Brain Wo Contrast  01/06/2012  *RADIOLOGY REPORT*  Clinical Data: The patient treated for stroke and left middle cerebral artery territory.  Follow-up.  MRI HEAD WITHOUT CONTRAST  Technique:  Multiplanar, multiecho pulse sequences of the brain and surrounding structures were obtained according to standard protocol without intravenous contrast.  Comparison: CT and CT angiography 01/05/2012  Findings: Diffusion imaging shows a cluster of sub-centimeter infarctions within the anterior to mid cerebellum on the left peripherally.  There is no significant swelling.  No hemorrhage or mass effect.  There are a few old small vessel infarctions in the right cerebellum.  The pons appears normal.  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  There is some residual hemosiderin in the area of the basal ganglia infarctions.  No large vessel territory infarction.  No mass lesion, hydrocephalus or extra-axial collection.  No pituitary mass.  No significant sinus disease. There is a small amount of fluid in the mastoid air  cells.  No skull or skull base lesion.  IMPRESSION: Cluster of sub-centimeter acute infarctions within the periphery of the lower to mid left cerebellar hemisphere.  Several old small vessel cerebellar infarctions, several old lacunar infarctions within the basal ganglia and moderate chronic small vessel changes in the hemispheric white matter.   Original Report Authenticated By: Thomasenia Sales, M.D.    Dg Chest Port 1 View  01/05/2012   *RADIOLOGY REPORT*  Clinical Data: Hypertension, possible aspiration  PORTABLE CHEST - 1 VIEW  Comparison: 10/28/2009  Findings: Lungs clear.  Heart size and pulmonary vascularity normal.  No effusion.  Visualized bones unremarkable.  IMPRESSION: No acute disease   Original Report Authenticated By: Osa Craver, M.D.    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 I had a long discussion with the patient explaining there is no way to definitively say whether his right carotid bifurcation stenosis is responsible for any of these events. Certainly yesterday his event sounds more vagal or cardiac. Of major concern however is that he does have a new right-sided stroke 1 MRI. We'll continue to follow with you. Suspect that he will come to right carotid endarterectomy since this is perineally correctable flow issue to his brain. I did explain the significance of his hypoperfused right internal carotid artery above the bifurcation and the added complexity of surgery for correction of this. We will continue along with his cardiac evaluation   LOS: 1 day   Samuel Berger 02/02/2012, 6:20 PM

## 2012-02-02 NOTE — Consult Note (Signed)
Admit date: 02/01/2012 Referring Physician  Dr. Pete Glatter Primary Physician Georgann Housekeeper, MD Primary Cardiologist  [place] (None) Reason for Consultation  Syncope  HPI: 66 year old male with extensive peripheral vascular disease, 95% right internal carotid artery stenosis with recent stroke one month ago having received TPA with recovery of symptoms who presented to the emergency room with syncope. He has had no prior cardiac issues, no prior MI.  Yesterday, was about to lunch, began to feel hot and sweaty for about 3-5 minutes then passed out at a table while sitting. After about a minute, he was awoken. He does remember being sweaty. No chest pain, no shortness of breath, no palpitations surrounding the incident. He slowly returned back to feeling somewhat normal.  Approximately 2 years ago while working on his house in the heat, he had a similar situation with syncope. He also had one situation while driving when he began to feel hot, sweaty, diaphoretic, pulled over. His wife took over. Went to the hospital at the [facility] and supposedly had a "full workup ", could not find anything.  He denies any dysarthria, other strokelike symptoms surrounding this recent syncopal episode yesterday. He is not on any AV nodal blocking agents. He takes clopidogrel for history of, amlodipine for his blood pressure as well his lisinopril. Atorvastatin for cholesterol. Pioglitazone for diabetes.  He is currently going for MRI.  While we were talking, he did develop a subtle sweat on his forehead and felt warm to the touch.    PMH:   Past Medical History  Diagnosis Date  . Hypertension   . Hyperlipidemia   . Stroke   . Iliac artery aneurysm, right   . Kidney stone   . CAD (coronary artery disease)   . Type II or unspecified type diabetes mellitus without mention of complication, not stated as uncontrolled   . GERD (gastroesophageal reflux disease)   . Aneurysm artery, iliac     PSH:   Past Surgical  History  Procedure Date  . Tonsillectomy   . Tee without cardioversion 01/08/2012    Procedure: TRANSESOPHAGEAL ECHOCARDIOGRAM (TEE);  Surgeon: Lewayne Bunting, MD;  Location: [facility] ENDOSCOPY;  Service: Cardiovascular;  Laterality: N/A;   Allergies:  Sulfa drugs cross reactors Prior to Admit Meds:   Prescriptions prior to admission  Medication Sig Dispense Refill  . amLODipine (NORVASC) 10 MG tablet Take 10 mg by mouth daily.      Marland Kitchen atorvastatin (LIPITOR) 40 MG tablet Take 40 mg by mouth daily at 6 PM.      . clopidogrel (PLAVIX) 75 MG tablet Take 75 mg by mouth daily with breakfast.      . lisinopril (PRINIVIL,ZESTRIL) 5 MG tablet Take 5 mg by mouth daily.      . pioglitazone (ACTOS) 30 MG tablet Take 30 mg by mouth daily.       Fam HX:    Family History  Problem Relation Age of Onset  . Diabetes Mother    Social HX:    History   Social History  . Marital Status: Married    Spouse Name: N/A    Number of Children: N/A  . Years of Education: N/A   Occupational History  . Not on file.   Social History Main Topics  . Smoking status: Former Smoker -- 0.5 packs/day for 30 years    Types: Cigars    Quit date: 01/05/2012  . Smokeless tobacco: Never Used  . Alcohol Use: No     quit 2 months  .  Drug Use: No  . Sexually Active: Not on file   Other Topics Concern  . Not on file   Social History Narrative  . No narrative on file     ROS:  All 11 ROS were addressed and are negative except what is stated in the HPI  Physical Exam: Blood pressure 129/54, pulse 97, temperature 97.4 F (36.3 C), temperature source Oral, resp. rate 19, height 6' (1.829 m), weight 88.7 kg (195 lb 8.8 oz), SpO2 93.00%.    General: Well developed, well nourished, in no acute distress Head: Eyes PERRLA, No xanthomas.   Normal cephalic and atramatic mildly diaphoretic while talking, warm skin Lungs:   Clear bilaterally to auscultation and percussion. Normal respiratory effort. No wheezes, no  rales. Heart:   HRRR S1 S2 Pulses are 2+ & equal. Systolic murmur right upper sternal border,  positive right carotid bruit. No JVD.  No abdominal bruits. Abdomen: Bowel sounds are positive, abdomen soft and non-tender without masses. No hepatosplenomegaly. Msk:  Back normal. Normal strength and tone for age. Extremities:   No clubbing, cyanosis or edema.  DP +1 Neuro: Alert and oriented X 3, non-focal, MAE x 4 GU: Deferred Rectal: Deferred Psych:  Good affect, responds appropriately    Labs:   Lab Results  Component Value Date   WBC 9.4 02/02/2012   HGB 14.1 02/02/2012   HCT 42.0 02/02/2012   MCV 88.8 02/02/2012   PLT 149* 02/02/2012    Lab 02/02/12 0620  NA 138  K 4.4  CL 105  CO2 24  BUN 14  CREATININE 0.75  CALCIUM 8.7  PROT --  BILITOT --  ALKPHOS --  ALT --  AST --  GLUCOSE 127*   No results found for this basename: PTT   Lab Results  Component Value Date   INR 0.95 01/05/2012   Lab Results  Component Value Date   TROPONINI <0.30 02/02/2012     Lab Results  Component Value Date   CHOL 140 01/06/2012   Lab Results  Component Value Date   HDL 43 01/06/2012   Lab Results  Component Value Date   LDLCALC 83 01/06/2012   Lab Results  Component Value Date   TRIG 71 01/06/2012   Lab Results  Component Value Date   CHOLHDL 3.3 01/06/2012   No results found for this basename: LDLDIRECT      EKG:  Sinus rhythm with no other obvious EKG changes. Will repeat. No obvious QT prolongation. Wandering baseline. Personally viewed.   Echocardiogram 01/06/12: Left ventricle: Technically difficult, but no definite wall abnormalities. The cavity size was normal. Wall thickness was increased in a pattern of mild LVH. The estimated ejection fraction was 60%. Wall motion was normal; there were no regional wall motion abnormalities. - Impressions: No cardiac source of embolism was identified, but cannot be ruled out on the basis of this examination.  Transesophageal  echocardiogram 01/08/12: - Left ventricle: Systolic function was normal. The estimated ejection fraction was in the range of 55% to 60%. Wall motion was normal; there were no regional wall motion abnormalities. - Aortic valve: No evidence of vegetation. - Mitral valve: No evidence of vegetation. - Left atrium: No evidence of thrombus in the atrial cavity or appendage. - Atrial septum: Echo contrast study showed no right-to-left atrial level shunt, at baseline or with provocation. There was an atrial septal aneurysm. - Tricuspid valve: No evidence of vegetation. - Pulmonic valve: No evidence of vegetation.    ASSESSMENT/PLAN:  66 year old male with recent stroke, peripheral vascular disease/carotid artery disease right internal carotid, hypertension, hyperlipidemia, diabetes here with syncope, one month post TPA administration following stroke with complete resolution of symptoms.  1. Syncope-based upon his history, there does appear to be a vasovagal component to this. Warmth, diaphoresis, prodrome. He does not demonstrate any palpitations. EKG currently is unremarkable. Telemetry currently unremarkable. We will continue to monitor. I will repeat EKG. Echocardiogram recently done does not demonstrate any significant valvular abnormalities. I do hear a soft murmur right upper sternal border, likely flow murmur. I do not feel that we need to repeat echocardiogram.  I would like to have him undergo nuclear stress test prior to discharge however to exclude any evidence of significant ischemia. It is certainly plausible given his peripheral vascular disease that he may have coronary ischemia, perhaps right coronary artery ischemia which may lead to possible arrhythmia and syncope. But once again, history tends to point in vagal origin. TSH is normal. Troponins are normal. Electrolytes unremarkable. We will follow along closely.  Donato Schultz, MD  02/02/2012  12:53 PM

## 2012-02-02 NOTE — Progress Notes (Signed)
MRI brain reveals new stroke post right frontal and MRA string sign right carotid.  I have informed the patient and Dr Arbie Cookey

## 2012-02-02 NOTE — Progress Notes (Signed)
Subjective: The patient did well last night no recurrent syncope no chest pain or dyspnea although he complains of a new generalized fatigue over the past few months  Objective: Vital signs in last 24 hours: Temp:  [97.4 F (36.3 C)-98.2 F (36.8 C)] 97.4 F (36.3 C) (11/02 0645) Pulse Rate:  [67-97] 97  (11/02 0645) Resp:  [16-20] 19  (11/02 0645) BP: (107-138)/(52-72) 129/54 mmHg (11/02 0645) SpO2:  [91 %-99 %] 93 % (11/02 0645) Weight:  [88.7 kg (195 lb 8.8 oz)] 88.7 kg (195 lb 8.8 oz) (11/01 1658) Weight change:  Last BM Date: 02/01/12  Intake/Output from previous day:   Intake/Output this shift: Total I/O In: 240 [P.O.:240] Out: -   Cardio: regular rate and rhythm, S1, S2 normal, no murmur, click, rub or gallop  Lab Results:  Basename 02/02/12 0620 02/01/12 1339 02/01/12 1322  WBC 9.4 -- 13.6*  HGB 14.1 15.6 --  HCT 42.0 46.0 --  PLT 149* -- 147*   BMET  Basename 02/02/12 0620 02/01/12 1339  NA 138 140  K 4.4 4.2  CL 105 101  CO2 24 --  GLUCOSE 127* 131*  BUN 14 19  CREATININE 0.75 1.10  CALCIUM 8.7 --    Studies/Results: No results found.  Medications:  Scheduled:   . amLODipine  10 mg Oral Daily  . atorvastatin  40 mg Oral q1800  . clopidogrel  75 mg Oral Q breakfast  . lisinopril  5 mg Oral Daily  . pioglitazone  30 mg Oral Daily  . sodium chloride  3 mL Intravenous Q12H    Assessment/Plan: 1. Syncope with known history of vascular disease.  I doubt new neurologic event and I am concerned about arrhythmia or sudden hypotension from cardiac ischemia.  Will ask cardiology to see 2.  Due to need to look at possible cardiac ischemia will convert to admission and look into stress test  LOS: 1 day   [facility] 02/02/2012, 10:21 AM

## 2012-02-03 DIAGNOSIS — I6529 Occlusion and stenosis of unspecified carotid artery: Secondary | ICD-10-CM | POA: Diagnosis not present

## 2012-02-03 DIAGNOSIS — R55 Syncope and collapse: Secondary | ICD-10-CM

## 2012-02-03 DIAGNOSIS — Z8673 Personal history of transient ischemic attack (TIA), and cerebral infarction without residual deficits: Secondary | ICD-10-CM | POA: Diagnosis not present

## 2012-02-03 DIAGNOSIS — I10 Essential (primary) hypertension: Secondary | ICD-10-CM | POA: Diagnosis not present

## 2012-02-03 DIAGNOSIS — E119 Type 2 diabetes mellitus without complications: Secondary | ICD-10-CM | POA: Diagnosis not present

## 2012-02-03 DIAGNOSIS — I6350 Cerebral infarction due to unspecified occlusion or stenosis of unspecified cerebral artery: Secondary | ICD-10-CM | POA: Diagnosis not present

## 2012-02-03 DIAGNOSIS — I639 Cerebral infarction, unspecified: Secondary | ICD-10-CM

## 2012-02-03 LAB — URINALYSIS, ROUTINE W REFLEX MICROSCOPIC
Glucose, UA: NEGATIVE mg/dL
Leukocytes, UA: NEGATIVE
Nitrite: NEGATIVE
Protein, ur: NEGATIVE mg/dL

## 2012-02-03 NOTE — Progress Notes (Signed)
[facility]  Subjective  - Right carotid stenosis 95% with history of stroke times two.  He did receive TPA and had complete resolution back in October 2013.  He was admitted back to the hospital and MRI shows a new right posterior frontal subcortical white matter stroke. I was asked to see him for further discussion.  Neurology is being consulted today.  He denise any symptoms of stroke.   He wants to go home as soon as possible.     Objective 150/76 87 98.4 F (36.9 C) (Oral) 18 96%  Intake/Output Summary (Last 24 hours) at 02/03/12 0844 Last data filed at 02/02/12 1500  Gross per 24 hour  Intake    480 ml  Output      0 ml  Net    480 ml    N/V/M intact with normal speech, swallowing and facial expressions.  No weakness noted.  Assessment/Planning:   Right carotid stenosis s/p two strokes without residual symptoms. Awaiting cardiology and neurology consults/work up for possible right carotid endarterectomy by Dr. Hart Rochester.  [name] Gallant [facility] 02/03/2012 8:44 AM --  Laboratory Lab Results:  Basename 02/02/12 0620 02/01/12 1339 02/01/12 1322  WBC 9.4 -- 13.6*  HGB 14.1 15.6 --  HCT 42.0 46.0 --  PLT 149* -- 147*   BMET  Basename 02/02/12 0620 02/01/12 1339  NA 138 140  K 4.4 4.2  CL 105 101  CO2 24 --  GLUCOSE 127* 131*  BUN 14 19  CREATININE 0.75 1.10  CALCIUM 8.7 --    COAG Lab Results  Component Value Date   INR 0.95 01/05/2012   No results found for this basename: PTT    Antibiotics Anti-infectives    None

## 2012-02-03 NOTE — Progress Notes (Signed)
Subjective: Denies any new neurologic symptoms  Objective: Vital signs in last 24 hours: Temp:  [97.9 F (36.6 C)-98.4 F (36.9 C)] 98.4 F (36.9 C) (11/03 0333) Pulse Rate:  [87-95] 87  (11/03 0333) Resp:  [18] 18  (11/03 0333) BP: (133-150)/(60-76) 150/76 mmHg (11/03 0333) SpO2:  [95 %-96 %] 96 % (11/03 0333) Weight:  [89.6 kg (197 lb 8.5 oz)] 89.6 kg (197 lb 8.5 oz) (11/03 0333) Weight change: 0.9 kg (1 lb 15.7 oz) Last BM Date: 02/01/12  Intake/Output from previous day: 11/02 0701 - 11/03 0700 In: 480 [P.O.:480] Out: -  Intake/Output this shift:    Resp: clear to auscultation bilaterally Cardio: regular rate and rhythm, S1, S2 normal, no murmur, click, rub or gallop  Lab Results:  Basename 02/02/12 0620 02/01/12 1339 02/01/12 1322  WBC 9.4 -- 13.6*  HGB 14.1 15.6 --  HCT 42.0 46.0 --  PLT 149* -- 147*   BMET  Basename 02/02/12 0620 02/01/12 1339  NA 138 140  K 4.4 4.2  CL 105 101  CO2 24 --  GLUCOSE 127* 131*  BUN 14 19  CREATININE 0.75 1.10  CALCIUM 8.7 --    Studies/Results: Mr [facility] Contrast  02/02/2012  *RADIOLOGY REPORT*  Clinical Data:  Syncopal episode.  Recent stroke.  MRI HEAD WITHOUT CONTRAST MRA HEAD WITHOUT CONTRAST  Technique:  Multiplanar, multiecho pulse sequences of the brain and surrounding structures were obtained without intravenous contrast. Angiographic images of the head were obtained using MRA technique without contrast.  Comparison:  MRI head January 06, 2012.  CT angiography of the neck and head 01/05/2012  MRI HEAD  Findings:  There is a new cluster of acute subcentimeter infarcts affecting the right posterior frontal subcortical white matter, not present previously.  Previous left cerebellar stroke is resolving. Baseline atrophy with chronic microvascular ischemic change noted. Remote infarcts in the right cerebellum and basal ganglia bilaterally.  Moderate chronic small vessel ischemic change throughout the cerebral hemispheric  white matter.  Chronic hemorrhage is associated with the bilateral basal ganglia infarcts. No large vessel cortical infarct.  No mass lesion, hydrocephalus, or acute hemorrhage.  No extra-axial fluid.  Midline structures unremarkable.  No acute sinus or mastoid disease.  Calvarium and skull base intact.  There is diminished flow related enhancement in the right internal carotid artery at the skull base.  This was present previously and will be described further on MRA.  Prior MRI brain did not show an acute right hemisphere infarct.  IMPRESSION: New areas of acute subcentimeter infarction affecting the right posterior frontal subcortical white matter not present previously. It is unclear if this relates to hypoperfusion of the right hemisphere, small vessel thrombosis, or an embolic phenomenon.  Diminished flow related enhancement right internal carotid artery.  MRA HEAD  Findings: There is poor flow related enhancement in the cervical, petrous, cavernous, and supraclinoid right internal carotid artery. There is enough flow related enhancement on the right to suggest the findings represent slow flow rather than thrombosis although confirmation of this would require formal catheter angiogram. The findings are suggestive of a so called "string sign".  The left internal carotid artery shows no focal irregularity or flow limiting stenosis  in the cervical, petrous, cavernous region. There is a estimated 50% percent stenosis of the supraclinoid ICA on the left.  Diminished flow related enhancement in the proximal right anterior cerebral artery and proximal left middle cerebral artery.  There is likely collateral flow from left to right  augmenting the right anterior circulation.  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  Both posterior cerebral arteries originate from the carotids accounting for the hypoplasia.  The vertebral arteries are roughly equal size in the neck.  There is a focal  occlusion of the left vertebral artery at the skull base with some reconstitution distally, but this vessel does not significantly contribute to basilar flow.  The right vertebral caliber decreases distal to the right PICA origin although no flow limiting stenosis is definitely observed.  IMPRESSION: Severe proximal stenosis right internal carotid artery with suspected string sign.  Collateral flow from left to right likely preserves  flow to the right anterior circulation.  Vertebrobasilar atherosclerotic change as described.  The MRA appearance is similar to that of previous CTA head/neck from 01/05/2012.   Original Report Authenticated By: Davonna Belling, M.D.    Mr Brain Wo Contrast  02/02/2012  *RADIOLOGY REPORT*  Clinical Data:  Syncopal episode.  Recent stroke.  MRI HEAD WITHOUT CONTRAST MRA HEAD WITHOUT CONTRAST  Technique:  Multiplanar, multiecho pulse sequences of the brain and surrounding structures were obtained without intravenous contrast. Angiographic images of the head were obtained using MRA technique without contrast.  Comparison:  MRI head January 06, 2012.  CT angiography of the neck and head 01/05/2012  MRI HEAD  Findings:  There is a new cluster of acute subcentimeter infarcts affecting the right posterior frontal subcortical white matter, not present previously.  Previous left cerebellar stroke is resolving. Baseline atrophy with chronic microvascular ischemic change noted. Remote infarcts in the right cerebellum and basal ganglia bilaterally.  Moderate chronic small vessel ischemic change throughout the cerebral hemispheric white matter.  Chronic hemorrhage is associated with the bilateral basal ganglia infarcts. No large vessel cortical infarct.  No mass lesion, hydrocephalus, or acute hemorrhage.  No extra-axial fluid.  Midline structures unremarkable.  No acute sinus or mastoid disease.  Calvarium and skull base intact.  There is diminished flow related enhancement in the right internal  carotid artery at the skull base.  This was present previously and will be described further on MRA.  Prior MRI brain did not show an acute right hemisphere infarct.  IMPRESSION: New areas of acute subcentimeter infarction affecting the right posterior frontal subcortical white matter not present previously. It is unclear if this relates to hypoperfusion of the right hemisphere, small vessel thrombosis, or an embolic phenomenon.  Diminished flow related enhancement right internal carotid artery.  MRA HEAD  Findings: There is poor flow related enhancement in the cervical, petrous, cavernous, and supraclinoid right internal carotid artery. There is enough flow related enhancement on the right to suggest the findings represent slow flow rather than thrombosis although confirmation of this would require formal catheter angiogram. The findings are suggestive of a so called "string sign".  The left internal carotid artery shows no focal irregularity or flow limiting stenosis  in the cervical, petrous, cavernous region. There is a estimated 50% percent stenosis of the supraclinoid ICA on the left.  Diminished flow related enhancement in the proximal right anterior cerebral artery and proximal left middle cerebral artery.  There is likely collateral flow from left to right augmenting the right anterior circulation.  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  Both posterior cerebral arteries originate from the carotids accounting for the hypoplasia.  The vertebral arteries are roughly equal size in the neck.  There is a focal occlusion of the left vertebral artery at  the skull base with some reconstitution distally, but this vessel does not significantly contribute to basilar flow.  The right vertebral caliber decreases distal to the right PICA origin although no flow limiting stenosis is definitely observed.  IMPRESSION: Severe proximal stenosis right internal carotid artery with suspected  string sign.  Collateral flow from left to right likely preserves  flow to the right anterior circulation.  Vertebrobasilar atherosclerotic change as described.  The MRA appearance is similar to that of previous CTA head/neck from 01/05/2012.   Original Report Authenticated By: Davonna Belling, M.D.     Medications:  Scheduled:   . amLODipine  10 mg Oral Daily  . atorvastatin  40 mg Oral q1800  . clopidogrel  75 mg Oral Q breakfast  . lisinopril  5 mg Oral Daily  . pioglitazone  30 mg Oral Daily  . sodium chloride  3 mL Intravenous Q12H    Assessment/Plan: 1. Stroke and significant right carotid narrowing plan is to proceed with carotid surgery when clear from cardiac standpoint especially with recent syncopal episode 2. Syncope stress cardiolyte currently on hold due to recent stroke 3  I will contact neurology to help with timing and when safe to proceed with cardiac and then carotid surgery esp with recent second stroke on plavix  LOS: 2 days   [facility] 02/03/2012, 8:05 AM

## 2012-02-03 NOTE — Progress Notes (Signed)
Patient ID: Samuel Berger, male   DOB: [birth year], 67 y.o.   MRN: [id number] No new neurologic deficits. Discussed with Dr. Pete Glatter, the patient and his wife present. Agree with neurologic evaluation with new changes on MRI. Patient is stable for discharge from vascular surgery standpoint. I will discuss this case with Dr. Hart Rochester and determine timing for right carotid endarterectomy. We will see him in the morning if he is still inpatient and as an outpatient on Tuesday if he is discharged

## 2012-02-03 NOTE — Progress Notes (Signed)
Subjective:  Feels well, no further syncope, no CP, no SOB  Objective:  Vital Signs in the last 24 hours: Temp:  [97.9 F (36.6 C)-98.4 F (36.9 C)] 98.4 F (36.9 C) (11/03 0333) Pulse Rate:  [87-95] 87  (11/03 0333) Resp:  [18] 18  (11/03 0333) BP: (133-160)/(60-81) 160/81 mmHg (11/03 1010) SpO2:  [95 %-96 %] 96 % (11/03 0333) Weight:  [89.6 kg (197 lb 8.5 oz)] 89.6 kg (197 lb 8.5 oz) (11/03 0333)  Intake/Output from previous day: 11/02 0701 - 11/03 0700 In: 480 [P.O.:480] Out: -    Physical Exam: General: Well developed, well nourished, in no acute distress. Head:  Normocephalic and atraumatic. No diaphoresis this am.  Lungs: Clear to auscultation and percussion. Heart: Normal S1 and S2.  Soft systolic flow murmur,no rubs or gallops.  Abdomen: soft, non-tender, positive bowel sounds. Extremities: No clubbing or cyanosis. No edema.     Lab Results:  Basename 02/02/12 0620 02/01/12 1339 02/01/12 1322  WBC 9.4 -- 13.6*  HGB 14.1 15.6 --  PLT 149* -- 147*    Basename 02/02/12 0620 02/01/12 1339  NA 138 140  K 4.4 4.2  CL 105 101  CO2 24 --  GLUCOSE 127* 131*  BUN 14 19  CREATININE 0.75 1.10    Basename 02/02/12 0620 02/01/12 2318  TROPONINI <0.30 <0.30   Imaging: Mr Maxine Glenn Head Wo Contrast  02/02/2012  *RADIOLOGY REPORT*  Clinical Data:  Syncopal episode.  Recent stroke.  MRI HEAD WITHOUT CONTRAST MRA HEAD WITHOUT CONTRAST  Technique:  Multiplanar, multiecho pulse sequences of the brain and surrounding structures were obtained without intravenous contrast. Angiographic images of the head were obtained using MRA technique without contrast.  Comparison:  MRI head January 06, 2012.  CT angiography of the neck and head 01/05/2012  MRI HEAD  Findings:  There is a new cluster of acute subcentimeter infarcts affecting the right posterior frontal subcortical white matter, not present previously.  Previous left cerebellar stroke is resolving. Baseline atrophy with chronic  microvascular ischemic change noted. Remote infarcts in the right cerebellum and basal ganglia bilaterally.  Moderate chronic small vessel ischemic change throughout the cerebral hemispheric white matter.  Chronic hemorrhage is associated with the bilateral basal ganglia infarcts. No large vessel cortical infarct.  No mass lesion, hydrocephalus, or acute hemorrhage.  No extra-axial fluid.  Midline structures unremarkable.  No acute sinus or mastoid disease.  Calvarium and skull base intact.  There is diminished flow related enhancement in the right internal carotid artery at the skull base.  This was present previously and will be described further on MRA.  Prior MRI brain did not show an acute right hemisphere infarct.  IMPRESSION: New areas of acute subcentimeter infarction affecting the right posterior frontal subcortical white matter not present previously. It is unclear if this relates to hypoperfusion of the right hemisphere, small vessel thrombosis, or an embolic phenomenon.  Diminished flow related enhancement right internal carotid artery.  MRA HEAD  Findings: There is poor flow related enhancement in the cervical, petrous, cavernous, and supraclinoid right internal carotid artery. There is enough flow related enhancement on the right to suggest the findings represent slow flow rather than thrombosis although confirmation of this would require formal catheter angiogram. The findings are suggestive of a so called "string sign".  The left internal carotid artery shows no focal irregularity or flow limiting stenosis  in the cervical, petrous, cavernous region. There is a estimated 50% percent stenosis of the supraclinoid ICA on  the left.  Diminished flow related enhancement in the proximal right anterior cerebral artery and proximal left middle cerebral artery.  There is likely collateral flow from left to right augmenting the right anterior circulation.  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  Both posterior cerebral arteries originate from the carotids accounting for the hypoplasia.  The vertebral arteries are roughly equal size in the neck.  There is a focal occlusion of the left vertebral artery at the skull base with some reconstitution distally, but this vessel does not significantly contribute to basilar flow.  The right vertebral caliber decreases distal to the right PICA origin although no flow limiting stenosis is definitely observed.  IMPRESSION: Severe proximal stenosis right internal carotid artery with suspected string sign.  Collateral flow from left to right likely preserves  flow to the right anterior circulation.  Vertebrobasilar atherosclerotic change as described.  The MRA appearance is similar to that of previous CTA head/neck from 01/05/2012.   Original Report Authenticated By: Davonna Belling, M.D.    Mr Brain Wo Contrast  02/02/2012  *RADIOLOGY REPORT*  Clinical Data:  Syncopal episode.  Recent stroke.  MRI HEAD WITHOUT CONTRAST MRA HEAD WITHOUT CONTRAST  Technique:  Multiplanar, multiecho pulse sequences of the brain and surrounding structures were obtained without intravenous contrast. Angiographic images of the head were obtained using MRA technique without contrast.  Comparison:  MRI head January 06, 2012.  CT angiography of the neck and head 01/05/2012  MRI HEAD  Findings:  There is a new cluster of acute subcentimeter infarcts affecting the right posterior frontal subcortical white matter, not present previously.  Previous left cerebellar stroke is resolving. Baseline atrophy with chronic microvascular ischemic change noted. Remote infarcts in the right cerebellum and basal ganglia bilaterally.  Moderate chronic small vessel ischemic change throughout the cerebral hemispheric white matter.  Chronic hemorrhage is associated with the bilateral basal ganglia infarcts. No large vessel cortical infarct.  No mass lesion, hydrocephalus, or acute hemorrhage.  No  extra-axial fluid.  Midline structures unremarkable.  No acute sinus or mastoid disease.  Calvarium and skull base intact.  There is diminished flow related enhancement in the right internal carotid artery at the skull base.  This was present previously and will be described further on MRA.  Prior MRI brain did not show an acute right hemisphere infarct.  IMPRESSION: New areas of acute subcentimeter infarction affecting the right posterior frontal subcortical white matter not present previously. It is unclear if this relates to hypoperfusion of the right hemisphere, small vessel thrombosis, or an embolic phenomenon.  Diminished flow related enhancement right internal carotid artery.  MRA HEAD  Findings: There is poor flow related enhancement in the cervical, petrous, cavernous, and supraclinoid right internal carotid artery. There is enough flow related enhancement on the right to suggest the findings represent slow flow rather than thrombosis although confirmation of this would require formal catheter angiogram. The findings are suggestive of a so called "string sign".  The left internal carotid artery shows no focal irregularity or flow limiting stenosis  in the cervical, petrous, cavernous region. There is a estimated 50% percent stenosis of the supraclinoid ICA on the left.  Diminished flow related enhancement in the proximal right anterior cerebral artery and proximal left middle cerebral artery.  There is likely collateral flow from left to right augmenting the right anterior circulation.  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  Both posterior cerebral arteries originate  from the carotids accounting for the hypoplasia.  The vertebral arteries are roughly equal size in the neck.  There is a focal occlusion of the left vertebral artery at the skull base with some reconstitution distally, but this vessel does not significantly contribute to basilar flow.  The right vertebral  caliber decreases distal to the right PICA origin although no flow limiting stenosis is definitely observed.  IMPRESSION: Severe proximal stenosis right internal carotid artery with suspected string sign.  Collateral flow from left to right likely preserves  flow to the right anterior circulation.  Vertebrobasilar atherosclerotic change as described.  The MRA appearance is similar to that of previous CTA head/neck from 01/05/2012.   Original Report Authenticated By: Davonna Belling, M.D.    Personally viewed.   Telemetry: Sinus tach, NSR now. No pauses, no VT.  Personally viewed.   Echocardiogram 01/06/12:  Left ventricle: Technically difficult, but no definite wall abnormalities. The cavity size was normal. Wall thickness was increased in a pattern of mild LVH. The estimated ejection fraction was 60%. Wall motion was normal; there were no regional wall motion abnormalities. - Impressions: No cardiac source of embolism was identified, but cannot be ruled out on the basis of this examination.  Transesophageal echocardiogram 01/08/12:  - Left ventricle: Systolic function was normal. The estimated ejection fraction was in the range of 55% to 60%. Wall motion was normal; there were no regional wall motion abnormalities. - Aortic valve: No evidence of vegetation. - Mitral valve: No evidence of vegetation. - Left atrium: No evidence of thrombus in the atrial cavity or appendage. - Atrial septum: Echo contrast study showed no right-to-left atrial level shunt, at baseline or with provocation. There was an atrial septal aneurysm. - Tricuspid valve: No evidence of vegetation. - Pulmonic valve: No evidence of vegetation.     Assessment/Plan:  Active Problems:  Carotid stenosis, right  Pre-syncope  H/O: CVA (cerebrovascular accident)  HTN (hypertension)  Type II or unspecified type diabetes mellitus without mention of complication, not stated as uncontrolled  66 year with new CVA findings,  RICA disease, HTN, DM, recent syncope.  -?Vagal etiology, could this be from recent CVA episode.  - TELE OK -Would be optimal to exclude ischemia with NUC stress however I would not feel comfortable proceeding with this at this point given the new stroke finding.  -Would appreciate guidance from neurology, expert opinion, on when stress testing would be reasonable.  -I have discussed with family. They understand.   If felt that it is urgent to perform carotid surgery, pre op guidelines would advocate proceeding to OR.   I will continue to follow.   SKAINS, MARK 02/03/2012, 11:39 AM

## 2012-02-03 NOTE — Consult Note (Signed)
Referring Physician: Dr. Pete Glatter    Chief Complaint: syncope  HPI: Samuel Berger is an 66 y.o. male who was sitting in a booth ordering and felt flushed and then "went out". He did not feel palpitations. He did not bite his tongue or urinate. He was told that he did not have seizure activity. In addition, no confusion afterwards. He was alone, but he said that the people in the restaurant told him that it lasted for 30 seconds. EMS checked his sugar and it was < 100. PTA he was on plavix 75mg  daily and this has been continued.  Now without deficits MRI shows new areas of acute subcentimeter infarction affecting the right posterior frontal subcortical white matter not present previously. It is unclear if this relates to hypoperfusion of the right hemisphere, small vessel thrombosis, or an embolic phenomenon. MRA shows severe proximal stenosis right internal carotid artery with suspected string sign. Collateral flow from left to right likely preserves flow to the right anterior circulation.  He had a stroke in October for which the MRI showed: Cluster of sub-centimeter acute infarctions within the periphery of the lower to mid left cerebellar hemisphere. Several old small vessel cerebellar infarctions, several old lacunar infarctions within the basal ganglia and moderate chronic small vessel changes in the hemispheric white matter. Angio confirmed R ICA 95% stenosis. At this time he presented with dizziness, slurred speech, right arm weakness. He received tPA with resolution of sx.   His family states that retrospectively, they had noticed that since July he had had episodes of slurred speech and knew that something was not right with him, these were transient in nature.   LSN: 1200 tPA Given: No; patient had syncope, lasted 30 seconds, no other symptoms.  Past Medical History  Diagnosis Date  . Hypertension   . Hyperlipidemia   . Stroke   . Iliac artery aneurysm, right   . Kidney stone   .  CAD (coronary artery disease)   . Type II or unspecified type diabetes mellitus without mention of complication, not stated as uncontrolled   . GERD (gastroesophageal reflux disease)   . Aneurysm artery, iliac     Past Surgical History  Procedure Date  . Tonsillectomy   . Tee without cardioversion 01/08/2012    Procedure: TRANSESOPHAGEAL ECHOCARDIOGRAM (TEE);  Surgeon: Lewayne Bunting, MD;  Location: [facility] ENDOSCOPY;  Service: Cardiovascular;  Laterality: N/A;    Family History  Problem Relation Age of Onset  . Diabetes Mother    Social History:  reports that he quit smoking about 4 weeks ago. His smoking use included Cigars. He has never used smokeless tobacco. He reports that he does not drink alcohol or use illicit drugs.  Allergies:  Allergies  Allergen Reactions  . Sulfa Drugs Cross Reactors Other (See Comments)    Unknown    Medications:  Prior to Admission:  Prescriptions prior to admission  Medication Sig Dispense Refill  . amLODipine (NORVASC) 10 MG tablet Take 10 mg by mouth daily.      Marland Kitchen atorvastatin (LIPITOR) 40 MG tablet Take 40 mg by mouth daily at 6 PM.      . clopidogrel (PLAVIX) 75 MG tablet Take 75 mg by mouth daily with breakfast.      . lisinopril (PRINIVIL,ZESTRIL) 5 MG tablet Take 5 mg by mouth daily.      . pioglitazone (ACTOS) 30 MG tablet Take 30 mg by mouth daily.       Scheduled:   . amLODipine  10 mg Oral Daily  . atorvastatin  40 mg Oral q1800  . clopidogrel  75 mg Oral Q breakfast  . lisinopril  5 mg Oral Daily  . pioglitazone  30 mg Oral Daily  . sodium chloride  3 mL Intravenous Q12H    ROS: History obtained from chart review and the patient, family at bedside  General ROS: negative for - chills, fatigue, fever, night sweats, weight gain or weight loss Psychological ROS: negative for - behavioral disorder, hallucinations, memory difficulties, mood swings or suicidal ideation Ophthalmic ROS: negative for - blurry vision, double vision,  eye pain or loss of vision    +SYNCOPE ENT ROS: negative for - epistaxis, nasal discharge, oral lesions, sore throat, tinnitus or vertigo Allergy and Immunology ROS: negative for - hives or itchy/watery eyes Hematological and Lymphatic ROS: negative for - bleeding problems, bruising or swollen lymph nodes Endocrine ROS: negative for - galactorrhea, hair pattern changes, polydipsia/polyuria or temperature intolerance Respiratory ROS: negative for - cough, hemoptysis, shortness of breath or wheezing Cardiovascular ROS: negative for - chest pain, dyspnea on exertion, edema or irregular heartbeat Gastrointestinal ROS: negative for - abdominal pain, diarrhea, hematemesis, nausea/vomiting or stool incontinence Genito-Urinary ROS: negative for - dysuria, hematuria, incontinence or urinary frequency/urgency Musculoskeletal ROS: negative for - joint swelling or muscular weakness Neurological ROS: as noted in HPI Dermatological ROS: negative for rash and skin lesion changes    Physical Examination: Blood pressure 160/81, pulse 87, temperature 98.4 F (36.9 C), temperature source Oral, resp. rate 18, height 6' (1.829 m), weight 89.6 kg (197 lb 8.5 oz), SpO2 96.00%.  Neurologic Examination: Mental Status: Alert, oriented, thought content appropriate.  Speech fluent. No aphasia.  Able to follow 3 step commands without difficulty. Cranial Nerves: II: visual fields grossly normal, pupils equal, round, reactive to light  III,IV, VI: ptosis not present, extra-ocular motions intact bilaterally V,VII: smile symmetric, facial light touch sensation normal bilaterally VIII: hearing normal bilaterally IX,X: gag reflex present XI: trapezius strength normal  XII: tongue strength normal  Motor: Right : Upper extremity   5/5    Left:     Upper extremity   5/5  Lower extremity   5/5     Lower extremity   5/5 Tone and bulk:normal tone throughout; no atrophy noted Sensory: Pinprick and light touch intact  throughout, bilaterally Deep Tendon Reflexes: 2+ and symmetric throughout Plantars: Right: downgoing   Left: downgoing Cerebellar: normal finger-to-nose, normal gait   SKIN: warm, dry, pink HEART: regular, no murmur LUNG: clear EXT: no open lesions   Results for orders placed during the hospital encounter of 02/01/12 (from the past 48 hour(s))  GLUCOSE, CAPILLARY     Status: Abnormal   Collection Time   02/01/12  1:15 PM      Component Value Range Comment   Glucose-Capillary 155 (*) 70 - 99 mg/dL    Comment 1 Notify RN      Comment 2 Documented in Chart     CBC     Status: Abnormal   Collection Time   02/01/12  1:22 PM      Component Value Range Comment   WBC 13.6 (*) 4.0 - 10.5 K/uL    RBC 4.97  4.22 - 5.81 MIL/uL    Hemoglobin 15.2  13.0 - 17.0 g/dL    HCT 96.0  45.4 - 09.8 %    MCV 88.7  78.0 - 100.0 fL    MCH 30.6  26.0 - 34.0 pg    MCHC  34.5  30.0 - 36.0 g/dL    RDW 47.8  29.5 - 62.1 %    Platelets 147 (*) 150 - 400 K/uL   POCT I-STAT, CHEM 8     Status: Abnormal   Collection Time   02/01/12  1:39 PM      Component Value Range Comment   Sodium 140  135 - 145 mEq/L    Potassium 4.2  3.5 - 5.1 mEq/L    Chloride 101  96 - 112 mEq/L    BUN 19  6 - 23 mg/dL    Creatinine, Ser 3.08  0.50 - 1.35 mg/dL    Glucose, Bld 657 (*) 70 - 99 mg/dL    Calcium, Ion 8.46  9.62 - 1.30 mmol/L    TCO2 25  0 - 100 mmol/L    Hemoglobin 15.6  13.0 - 17.0 g/dL    HCT 95.2  84.1 - 32.4 %   POCT I-STAT TROPONIN I     Status: Normal   Collection Time   02/01/12  2:03 PM      Component Value Range Comment   Troponin i, poc 0.01  0.00 - 0.08 ng/mL    Comment 3            GLUCOSE, CAPILLARY     Status: Abnormal   Collection Time   02/01/12  4:57 PM      Component Value Range Comment   Glucose-Capillary 114 (*) 70 - 99 mg/dL    Comment 1 Notify RN     TROPONIN I     Status: Normal   Collection Time   02/01/12  5:38 PM      Component Value Range Comment   Troponin I <0.30  <0.30 ng/mL     TSH     Status: Normal   Collection Time   02/01/12  5:39 PM      Component Value Range Comment   TSH 0.950  0.350 - 4.500 uIU/mL   HEMOGLOBIN A1C     Status: Abnormal   Collection Time   02/01/12  5:39 PM      Component Value Range Comment   Hemoglobin A1C 6.4 (*) <5.7 %    Mean Plasma Glucose 137 (*) <117 mg/dL   TROPONIN I     Status: Normal   Collection Time   02/01/12 11:18 PM      Component Value Range Comment   Troponin I <0.30  <0.30 ng/mL   TROPONIN I     Status: Normal   Collection Time   02/02/12  6:20 AM      Component Value Range Comment   Troponin I <0.30  <0.30 ng/mL   BASIC METABOLIC PANEL     Status: Abnormal   Collection Time   02/02/12  6:20 AM      Component Value Range Comment   Sodium 138  135 - 145 mEq/L    Potassium 4.4  3.5 - 5.1 mEq/L    Chloride 105  96 - 112 mEq/L    CO2 24  19 - 32 mEq/L    Glucose, Bld 127 (*) 70 - 99 mg/dL    BUN 14  6 - 23 mg/dL    Creatinine, Ser 4.01  0.50 - 1.35 mg/dL    Calcium 8.7  8.4 - 02.7 mg/dL    GFR calc non Af Amer >90  >90 mL/min    GFR calc Af Amer >90  >90 mL/min   CBC     Status:  Abnormal   Collection Time   02/02/12  6:20 AM      Component Value Range Comment   WBC 9.4  4.0 - 10.5 K/uL    RBC 4.73  4.22 - 5.81 MIL/uL    Hemoglobin 14.1  13.0 - 17.0 g/dL    HCT 28.4  13.2 - 44.0 %    MCV 88.8  78.0 - 100.0 fL    MCH 29.8  26.0 - 34.0 pg    MCHC 33.6  30.0 - 36.0 g/dL    RDW 10.2  72.5 - 36.6 %    Platelets 149 (*) 150 - 400 K/uL   GLUCOSE, CAPILLARY     Status: Abnormal   Collection Time   02/02/12 11:36 AM      Component Value Range Comment   Glucose-Capillary 144 (*) 70 - 99 mg/dL    Comment 1 Notify RN      Mr Maxine Glenn Head Wo Contrast 02/02/2012   Severe proximal stenosis right internal carotid artery with suspected string sign.  Collateral flow from left to right likely preserves  flow to the right anterior circulation. The MRA appearance is similar to that of previous CTA head/neck 01/2012   Mr Brain  Wo Contrast 02/02/2012   New areas of acute subcentimeter infarction affecting the right posterior frontal subcortical white matter not present previously. It is unclear if this relates to hypoperfusion of the right hemisphere, small vessel thrombosis, or an embolic phenomenon.  Diminished flow related enhancement right internal carotid artery.   Assessment: 66 y.o. male with episode of syncope, no residual problems. Came to be admitted and MRI shows new areas of right posterior frontal subcortical white matter not present previously. Patient has no deficits. Plans prior to this were for him to followup with [facility] and get elective repair, in fact he was to be seen there in 2 days to set up procedure. Cardiology is concerned regarding stress testing in new stroke especially with RICA string sign so early from stroke.   Guy Franco PA-C, MBA, MHA [facility] Pager [phone]   Multiple strokes and possibly more TIAs in the past month. At this time, I would favor early intervention rather than delayed treatment of his carotid stenosis. He has not yet been evaluated by therapy, though I do not expect him to have needs. I would favor that he remain in the hospital to further plan his carotid endarterectomy as I feel that he is at very high risk currently.   As far as etiology, I suspect that this is hypoperfusion secondary to a syncope event, but cannot rule out artery to artery embolus.   1) Continue plavix.  2) PT, OT 3) I do not think that his current CVA would be a contraindication to stress testing, per se, but I would avoid agents with the potential to cause vasodilation or hypoperfusion.   I have seen and evaluated the patient. I have reviewed the above note and made appropriate changes.   Ritta Slot, MD [facility] [phone]  If 7pm- 7am, please page neurology on call at [phone].

## 2012-02-04 ENCOUNTER — Encounter: Payer: Self-pay | Admitting: Vascular Surgery

## 2012-02-04 ENCOUNTER — Observation Stay (HOSPITAL_COMMUNITY): Payer: Medicare Other

## 2012-02-04 ENCOUNTER — Other Ambulatory Visit: Payer: Self-pay | Admitting: *Deleted

## 2012-02-04 ENCOUNTER — Encounter (HOSPITAL_COMMUNITY): Payer: Self-pay | Admitting: Radiology

## 2012-02-04 DIAGNOSIS — I6350 Cerebral infarction due to unspecified occlusion or stenosis of unspecified cerebral artery: Secondary | ICD-10-CM | POA: Diagnosis not present

## 2012-02-04 DIAGNOSIS — I6529 Occlusion and stenosis of unspecified carotid artery: Secondary | ICD-10-CM | POA: Diagnosis not present

## 2012-02-04 DIAGNOSIS — R55 Syncope and collapse: Secondary | ICD-10-CM | POA: Diagnosis not present

## 2012-02-04 DIAGNOSIS — I10 Essential (primary) hypertension: Secondary | ICD-10-CM | POA: Diagnosis not present

## 2012-02-04 DIAGNOSIS — I658 Occlusion and stenosis of other precerebral arteries: Secondary | ICD-10-CM | POA: Diagnosis not present

## 2012-02-04 DIAGNOSIS — E119 Type 2 diabetes mellitus without complications: Secondary | ICD-10-CM | POA: Diagnosis not present

## 2012-02-04 MED ORDER — ASPIRIN 81 MG PO CHEW
81.0000 mg | CHEWABLE_TABLET | Freq: Every day | ORAL | Status: DC
  Administered 2012-02-04: 81 mg via ORAL
  Filled 2012-02-04: qty 1

## 2012-02-04 MED ORDER — ASPIRIN 81 MG PO TBEC: 81.0000 mg | DELAYED_RELEASE_TABLET | Freq: Every day | ORAL | Status: DC

## 2012-02-04 MED ORDER — IOHEXOL 350 MG/ML SOLN
50.0000 mL | Freq: Once | INTRAVENOUS | Status: AC | PRN
  Administered 2012-02-04: 50 mL via INTRAVENOUS

## 2012-02-04 NOTE — Progress Notes (Signed)
PT Cancellation Note  Patient Details Name: Samuel Berger MRN: [id number] DOB: 08/13/1945   Cancelled Treatment:    Reason Eval/Treat Not Completed: Other (comment) (Pt requested to cancel PT&OT services).  Pt was observed walking independently around his room.    Sharion Balloon 02/04/2012, 10:01 AM  Sharion Balloon, SPT [facility] Rehab Services [phone]

## 2012-02-04 NOTE — Progress Notes (Addendum)
Patient, had completed his CTA of his head and neck and Annie Main, NP made aware and stated ok to DC. pt given discharge instructions and verbalized understanding about vascular surgery date. ... Will discharge home as ordered.Daughter to transport home     Lean Jaeger, Randall An RN

## 2012-02-04 NOTE — Discharge Summary (Signed)
Physician Discharge Summary  Patient ID: AERICK SIMEK MRN: [id number] DOB/AGE: 1945-08-09 66 y.o.  Admit date: 02/01/2012 Discharge date: 02/04/2012  Admission Diagnoses:  Discharge Diagnoses:  Active Problems:  Carotid stenosis, right  Pre-syncope  H/O: CVA (cerebrovascular accident)  HTN (hypertension)  Type II or unspecified type diabetes mellitus without mention of complication, not stated as uncontrolled  Stroke   Discharged Condition: good  Hospital Course:66 years old male, with history of history of stroke; had TPA done for his stroke symptoms about a month ago presented with syncopal episode while he was in [employer]. He had no chest pain palpitation or shortness of breath. Problem #1 syncope: admitted to telemetry normal sinus rhythm rule out for MI. Blood pressure been stable cardiology consulted recommending getting a cardiac stress test but because of his tiny subacute infarct wanted to wait for the stress test at this point. He had severe carotid disease awaiting carotid endarterectomy of the right carotid with the vascular surgery so. He had MRI MRA showed small subcentimeter subacute infarct in the right frontal area unclear if this was related to syncope or was before. He had no deficits of weakness or trouble speaking or swallowing. Patient was started on aspirin continue Plavix Problem #2: right small subacute infarct with history of previous CVA; and TPA. Urology was consulted continue on current medication or with aspirin Blood pressure controlled. Problem #3 hypertension continue on blood pressure medication stable #4 diabetes: A1c stable continue Actos Problem #5 carotid disease: patient will get his right carotid endarterectomy but because of his episode he had the angiogram on previous admission he had MRA done this admission. Recommend getting a CTangiogram, before the surgery. The surgery schedule for November 14th with the surgery vascular- Dr.  Hart Rochester. Plavix will be off for 3 days ; he will continue his aspirin  Consults: cardiology, neurology and vascular surgery  Significant Diagnostic Studies: labs: blood work normal; blood chemistries, cardiac markers, lipids, blood counts. and radiology: MRI: MRI of the brain showed subacute subcentimeter infarct right frontal area; MRA with severe carotid disease  Treatments: anticoagulation: ASA and Plavix  Discharge Exam: Blood pressure 141/58, pulse 83, temperature 98 F (36.7 C), temperature source Oral, resp. rate 18, height 6' (1.829 m), weight 89.3 kg (196 lb 13.9 oz), SpO2 98.00%. General appearance: alert Resp: clear to auscultation bilaterally Cardio: regular rate and rhythm Neurologic: Grossly normal  Disposition: 01-Home or Self Care  Discharge Orders    Future Appointments: Provider: Department: Dept Phone: Center:   02/05/2012 2:30 PM [facility] [phone] [facility]   02/05/2012 3:45 PM Pryor Ochoa, MD [facility] [phone] [facility]     Future Orders Please Complete By Expires   Diet - low sodium heart healthy      Increase activity slowly      Discharge instructions      Comments:   Vasular Surgery with Dr Hart Rochester as schedule.       Medication List     As of 02/04/2012 11:34 AM    TAKE these medications         amLODipine 10 MG tablet   Commonly known as: NORVASC   Take 10 mg by mouth daily.      aspirin 81 MG EC tablet   Take 1 tablet (81 mg total) by mouth daily. Swallow whole.      atorvastatin 40 MG tablet   Commonly known as: LIPITOR   Take 40 mg  by mouth daily at 6 PM.      clopidogrel 75 MG tablet   Commonly known as: PLAVIX   Take 75 mg by mouth daily with breakfast.      lisinopril 5 MG tablet   Commonly known as: PRINIVIL,ZESTRIL   Take 5 mg by mouth daily.      pioglitazone 30 MG tablet   Commonly known as: ACTOS   Take 30 mg by mouth daily.       discharge  plan, total time 35 minutes  Signed: Malayia Spizzirri 02/04/2012, 11:34 AM

## 2012-02-04 NOTE — Progress Notes (Signed)
Samuel Berger,PT Acute Rehabilitation [phone] (pager)  

## 2012-02-04 NOTE — Care Management Note (Signed)
    Page 1 of 1   02/04/2012     12:05:25 PM   CARE MANAGEMENT NOTE 02/04/2012  Patient:  Samuel Berger, Samuel Berger   Account Number:  [id number]  Date Initiated:  02/04/2012  Documentation initiated by:  Laterria Lasota  Subjective/Objective Assessment:   PT ADM WITH SYNCOPE; MRI SHOWED STROKE.  PTA, PT INDEPENDENT, LIVES WITH SPOUSE.     Action/Plan:   MET WITH PT AND DAUGHTER TO DISCUSS DC PLANS.  FAMILY TO PROVIDE CARE AT DC.  PT DENIES HOME NEEDS.   Anticipated DC Date:  02/04/2012   Anticipated DC Plan:  HOME/SELF CARE      DC Planning Services  CM consult      Choice offered to / List presented to:             Status of service:  Completed, signed off Medicare Important Message given?   (If response is "NO", the following Medicare IM given date fields will be blank) Date Medicare IM given:   Date Additional Medicare IM given:    Discharge Disposition:  HOME/SELF CARE  Per UR Regulation:  Reviewed for med. necessity/level of care/duration of stay  If discussed at Long Length of Stay Meetings, dates discussed:    Comments:

## 2012-02-04 NOTE — Progress Notes (Signed)
Stroke Team Progress Note  HISTORY Samuel Berger is an 66 y.o. male who was sitting in a booth ordering 02/01/2012 and felt flushed and then "went out". He did not feel palpitations. He did not bite his tongue or urinate. He was told that he did not have seizure activity. In addition, no confusion afterwards. He was alone, but he said that the people in the restaurant told him that it lasted for 30 seconds. EMS checked his sugar and it was < 100. PTA he was on plavix 75mg  daily and this has been continued.   Now without deficits MRI shows new areas of acute subcentimeter infarction affecting the right posterior frontal subcortical white matter not present previously. It is unclear if this relates to hypoperfusion of the right hemisphere, small vessel thrombosis, or an embolic phenomenon. MRA shows severe proximal stenosis right internal carotid artery with suspected string sign. Collateral flow from left to right likely preserves flow to the right anterior circulation.   He had a stroke in October for which the MRI showed: Cluster of sub-centimeter acute infarctions within the periphery of the lower to mid left cerebellar hemisphere. Several old small vessel cerebellar infarctions, several old lacunar infarctions within the basal ganglia and moderate chronic small vessel changes in the hemispheric white matter. Angio confirmed R ICA 95% stenosis. At this time he presented with dizziness, slurred speech, right arm weakness. He received tPA with resolution of sx.   His family states that retrospectively, they had noticed that since July he had had episodes of slurred speech and knew that something was not right with him, these were transient in nature.   Patient was not a TPA candidate secondary to stroke within 3 months. He was admitted for further evaluation and treatment.  SUBJECTIVE His daughter is at the bedside.  Overall he feels his condition is stable. He has been discharged and is anxious to go  home.  OBJECTIVE Most recent Vital Signs: Filed Vitals:   02/03/12 1010 02/03/12 1217 02/03/12 2142 02/04/12 0522  BP: 160/81 130/75 136/66 171/76  Pulse:  77 77 83  Temp:  97.7 F (36.5 C) 98.3 F (36.8 C) 98 F (36.7 C)  TempSrc:  Oral Oral Oral  Resp:  18 18 18   Height:      Weight:    89.3 kg (196 lb 13.9 oz)  SpO2:  99% 94% 98%   CBG (last 3)   Basename 02/02/12 1136 02/01/12 1657 02/01/12 1315  GLUCAP 144* 114* 155*   IV Fluid Intake:     . [DISCONTINUED] sodium chloride 100 mL/hr at 02/02/12 2311   MEDICATIONS    . amLODipine  10 mg Oral Daily  . atorvastatin  40 mg Oral q1800  . clopidogrel  75 mg Oral Q breakfast  . lisinopril  5 mg Oral Daily  . pioglitazone  30 mg Oral Daily  . sodium chloride  3 mL Intravenous Q12H   PRN:  acetaminophen, acetaminophen, morphine injection, ondansetron (ZOFRAN) IV, ondansetron  Diet:  Carb Control thin liquids Activity:  Up with assistance DVT Prophylaxis:  SCDs   CLINICALLY SIGNIFICANT STUDIES Basic Metabolic Panel:  Lab 02/02/12 4540 02/01/12 1339  NA 138 140  K 4.4 4.2  CL 105 101  CO2 24 --  GLUCOSE 127* 131*  BUN 14 19  CREATININE 0.75 1.10  CALCIUM 8.7 --  MG -- --  PHOS -- --   Liver Function Tests: No results found for this basename: AST:2,ALT:2,ALKPHOS:2,BILITOT:2,PROT:2,ALBUMIN:2 in the last 168  hours CBC:  Lab 02/02/12 0620 02/01/12 1339 02/01/12 1322  WBC 9.4 -- 13.6*  NEUTROABS -- -- --  HGB 14.1 15.6 --  HCT 42.0 46.0 --  MCV 88.8 -- 88.7  PLT 149* -- 147*   Coagulation: No results found for this basename: LABPROT:4,INR:4 in the last 168 hours Cardiac Enzymes:  Lab 02/02/12 0620 02/01/12 2318 02/01/12 1738  CKTOTAL -- -- --  CKMB -- -- --  CKMBINDEX -- -- --  TROPONINI <0.30 <0.30 <0.30   Urinalysis:  Lab 02/03/12 1700  COLORURINE YELLOW  LABSPEC 1.022  PHURINE 6.0  GLUCOSEU NEGATIVE  HGBUR NEGATIVE  BILIRUBINUR NEGATIVE  KETONESUR NEGATIVE  PROTEINUR NEGATIVE  UROBILINOGEN 1.0   NITRITE NEGATIVE  LEUKOCYTESUR NEGATIVE   Lipid Panel    Component Value Date/Time   CHOL 140 01/06/2012 0431   TRIG 71 01/06/2012 0431   HDL 43 01/06/2012 0431   CHOLHDL 3.3 01/06/2012 0431   VLDL 14 01/06/2012 0431   LDLCALC 83 01/06/2012 0431   HgbA1C  Lab Results  Component Value Date   HGBA1C 6.4* 02/01/2012    Urine Drug Screen:     Component Value Date/Time   LABOPIA NONE DETECTED 01/05/2012 1902   COCAINSCRNUR NONE DETECTED 01/05/2012 1902   LABBENZ NONE DETECTED 01/05/2012 1902   AMPHETMU NONE DETECTED 01/05/2012 1902   THCU NONE DETECTED 01/05/2012 1902   LABBARB NONE DETECTED 01/05/2012 1902    Alcohol Level: No results found for this basename: ETH:2 in the last 168 hours  CT of the brain    MRI of the brain  02/02/2012   New areas of acute subcentimeter infarction affecting the right posterior frontal subcortical white matter not present previously. It is unclear if this relates to hypoperfusion of the right hemisphere, small vessel thrombosis, or an embolic phenomenon.  Diminished flow related enhancement right internal carotid artery.    MRA of the brain  02/02/2012  Severe proximal stenosis right internal carotid artery with suspected string sign.  Collateral flow from left to right likely preserves  flow to the right anterior circulation.  Vertebrobasilar atherosclerotic change as described.  The MRA appearance is similar to that of previous CTA head/neck from 01/05/2012.   2D Echocardiogram  01/06/2012 EF 55-60% with no source of embolus.   CT angio neck  01/04/2102 95% or greater stenosis of the right internal carotid artery proximally. Beyond that, the vessel is markedly narrowed because of diminished inflow. 20% stenosis of the proximal left internal carotid artery. Both vertebral arteries are diminutive vessels but are patent through the neck. I estimate there is a 50% stenosis of the proximal right vertebral artery.  TEE 01/08/2012 EF 55-60% with no source of embolus.  Atrial septal aneurysm  EKG  normal sinus rhythm.   Therapy Recommendations no therapy needs  Physical Exam   Pleasant elderly caucasian male not in distress.Awake alert. Afebrile. Head is nontraumatic. Neck is supple without bruit. Hearing is normal. Cardiac exam no murmur or gallop. Lungs are clear to auscultation. Distal pulses are well felt. No neck bruit heard Neurological Exam ; Awake  Alert oriented x 3. Normal speech and language.eye movements full without nystagmus.Fundi not visualized. Vision acuity and fields appear normal.ace symmetric. Tongue midline. Normal strength, tone, reflexes and coordination. Normal sensation. Gait deferred.  ASSESSMENT Mr. MAXUM CASSARINO is a 66 y.o. male presenting with syncope, patient became warm and "passed out". Imaging confirms a right posterior frontal subcortical infarct. Infarct asymptomatic. Infarct felt to be embolic from known  right ICA stenosis vs syncope from hypoperfusion. Patient with incidental left cerebellar infarct Oct 2013 where asymptomatic right ICA stenosis was discovered. Had planned for right CEA. Right CEA scheduled for next week, 11/14. Concern if vessel has now totally occluded. On clopidogrel 75 mg orally every day prior to admission. Now on clopidogrel 75 mg orally every day for secondary stroke prevention. Patient with no resultant neuro deficits.   Right carotid stenosis  Hypertension  Diabetes, HgbA1c 6.4    Hospital day # 3  TREATMENT/PLAN  Continue clopidogrel 75 mg orally every day for secondary stroke prevention for now. Add aspirin 81 mg daily.  Recommend assessing right ICA prior to OR if string sign then preponing surgery to this Friday.. Will check CTA neck - if artery open, go ahead and do OR if possible, maybe earlier than 11/14; if artery occluded, will cancel surgery. Ok for discharge following CTA.  Dr Pearlean Brownie will call Dr. Donette Larry and Dr. Hart Rochester to discuss plan of care. He discussed plan with pt and  daughter. F/u with me as outpt in 2 months. Annie Main, MSN, RN, ANVP-BC, ANP-BC, [facility] Pager: [phone] 02/04/2012 8:07 AM  Scribe for Dr. Delia Heady, Stroke Center Medical Director, who has personally reviewed chart, pertinent data, examined the patient and developed the plan of care. Pager:  [phone]

## 2012-02-04 NOTE — Progress Notes (Signed)
Subjective: Patient without complaint eating breakfast no chest pain no trouble swallowing  Objective: Vital signs in last 24 hours: Temp:  [97.7 F (36.5 C)-98.3 F (36.8 C)] 98 F (36.7 C) (11/04 0522) Pulse Rate:  [77-83] 83  (11/04 0522) Resp:  [18] 18  (11/04 0522) BP: (130-171)/(66-81) 171/76 mmHg (11/04 0522) SpO2:  [94 %-99 %] 98 % (11/04 0522) Weight:  [89.3 kg (196 lb 13.9 oz)] 89.3 kg (196 lb 13.9 oz) (11/04 0522) Weight change: -0.3 kg (-10.6 oz) Last BM Date: 02/03/12  Intake/Output from previous day: 11/03 0701 - 11/04 0700 In: 480 [P.O.:480] Out: -  Intake/Output this shift:    General appearance: alert Resp: clear to auscultation bilaterally Cardio: regular rate and rhythm Neurologic: Grossly normal  Lab Results:  Basename 02/02/12 0620 02/01/12 1339 02/01/12 1322  WBC 9.4 -- 13.6*  HGB 14.1 15.6 --  HCT 42.0 46.0 --  PLT 149* -- 147*   BMET  Basename 02/02/12 0620 02/01/12 1339  NA 138 140  K 4.4 4.2  CL 105 101  CO2 24 --  GLUCOSE 127* 131*  BUN 14 19  CREATININE 0.75 1.10  CALCIUM 8.7 --    Studies/Results: Mr [facility] Contrast  02/02/2012  *RADIOLOGY REPORT*  Clinical Data:  Syncopal episode.  Recent stroke.  MRI HEAD WITHOUT CONTRAST MRA HEAD WITHOUT CONTRAST  Technique:  Multiplanar, multiecho pulse sequences of the brain and surrounding structures were obtained without intravenous contrast. Angiographic images of the head were obtained using MRA technique without contrast.  Comparison:  MRI head January 06, 2012.  CT angiography of the neck and head 01/05/2012  MRI HEAD  Findings:  There is a new cluster of acute subcentimeter infarcts affecting the right posterior frontal subcortical white matter, not present previously.  Previous left cerebellar stroke is resolving. Baseline atrophy with chronic microvascular ischemic change noted. Remote infarcts in the right cerebellum and basal ganglia bilaterally.  Moderate chronic small vessel  ischemic change throughout the cerebral hemispheric white matter.  Chronic hemorrhage is associated with the bilateral basal ganglia infarcts. No large vessel cortical infarct.  No mass lesion, hydrocephalus, or acute hemorrhage.  No extra-axial fluid.  Midline structures unremarkable.  No acute sinus or mastoid disease.  Calvarium and skull base intact.  There is diminished flow related enhancement in the right internal carotid artery at the skull base.  This was present previously and will be described further on MRA.  Prior MRI brain did not show an acute right hemisphere infarct.  IMPRESSION: New areas of acute subcentimeter infarction affecting the right posterior frontal subcortical white matter not present previously. It is unclear if this relates to hypoperfusion of the right hemisphere, small vessel thrombosis, or an embolic phenomenon.  Diminished flow related enhancement right internal carotid artery.  MRA HEAD  Findings: There is poor flow related enhancement in the cervical, petrous, cavernous, and supraclinoid right internal carotid artery. There is enough flow related enhancement on the right to suggest the findings represent slow flow rather than thrombosis although confirmation of this would require formal catheter angiogram. The findings are suggestive of a so called "string sign".  The left internal carotid artery shows no focal irregularity or flow limiting stenosis  in the cervical, petrous, cavernous region. There is a estimated 50% percent stenosis of the supraclinoid ICA on the left.  Diminished flow related enhancement in the proximal right anterior cerebral artery and proximal left middle cerebral artery.  There is likely collateral flow from left to  right augmenting the right anterior circulation.  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  Both posterior cerebral arteries originate from the carotids accounting for the hypoplasia.  The vertebral arteries are  roughly equal size in the neck.  There is a focal occlusion of the left vertebral artery at the skull base with some reconstitution distally, but this vessel does not significantly contribute to basilar flow.  The right vertebral caliber decreases distal to the right PICA origin although no flow limiting stenosis is definitely observed.  IMPRESSION: Severe proximal stenosis right internal carotid artery with suspected string sign.  Collateral flow from left to right likely preserves  flow to the right anterior circulation.  Vertebrobasilar atherosclerotic change as described.  The MRA appearance is similar to that of previous CTA head/neck from 01/05/2012.   Original Report Authenticated By: Davonna Belling, M.D.    Mr Brain Wo Contrast  02/02/2012  *RADIOLOGY REPORT*  Clinical Data:  Syncopal episode.  Recent stroke.  MRI HEAD WITHOUT CONTRAST MRA HEAD WITHOUT CONTRAST  Technique:  Multiplanar, multiecho pulse sequences of the brain and surrounding structures were obtained without intravenous contrast. Angiographic images of the head were obtained using MRA technique without contrast.  Comparison:  MRI head January 06, 2012.  CT angiography of the neck and head 01/05/2012  MRI HEAD  Findings:  There is a new cluster of acute subcentimeter infarcts affecting the right posterior frontal subcortical white matter, not present previously.  Previous left cerebellar stroke is resolving. Baseline atrophy with chronic microvascular ischemic change noted. Remote infarcts in the right cerebellum and basal ganglia bilaterally.  Moderate chronic small vessel ischemic change throughout the cerebral hemispheric white matter.  Chronic hemorrhage is associated with the bilateral basal ganglia infarcts. No large vessel cortical infarct.  No mass lesion, hydrocephalus, or acute hemorrhage.  No extra-axial fluid.  Midline structures unremarkable.  No acute sinus or mastoid disease.  Calvarium and skull base intact.  There is diminished  flow related enhancement in the right internal carotid artery at the skull base.  This was present previously and will be described further on MRA.  Prior MRI brain did not show an acute right hemisphere infarct.  IMPRESSION: New areas of acute subcentimeter infarction affecting the right posterior frontal subcortical white matter not present previously. It is unclear if this relates to hypoperfusion of the right hemisphere, small vessel thrombosis, or an embolic phenomenon.  Diminished flow related enhancement right internal carotid artery.  MRA HEAD  Findings: There is poor flow related enhancement in the cervical, petrous, cavernous, and supraclinoid right internal carotid artery. There is enough flow related enhancement on the right to suggest the findings represent slow flow rather than thrombosis although confirmation of this would require formal catheter angiogram. The findings are suggestive of a so called "string sign".  The left internal carotid artery shows no focal irregularity or flow limiting stenosis  in the cervical, petrous, cavernous region. There is a estimated 50% percent stenosis of the supraclinoid ICA on the left.  Diminished flow related enhancement in the proximal right anterior cerebral artery and proximal left middle cerebral artery.  There is likely collateral flow from left to right augmenting the right anterior circulation.  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  Both posterior cerebral arteries originate from the carotids accounting for the hypoplasia.  The vertebral arteries are roughly equal size in the neck.  There is a focal occlusion of the left vertebral artery  at the skull base with some reconstitution distally, but this vessel does not significantly contribute to basilar flow.  The right vertebral caliber decreases distal to the right PICA origin although no flow limiting stenosis is definitely observed.  IMPRESSION: Severe proximal stenosis  right internal carotid artery with suspected string sign.  Collateral flow from left to right likely preserves  flow to the right anterior circulation.  Vertebrobasilar atherosclerotic change as described.  The MRA appearance is similar to that of previous CTA head/neck from 01/05/2012.   Original Report Authenticated By: Davonna Belling, M.D.     Medications: I have reviewed the patient's current medications.  Assessment/Plan: Syncopal episode, workup included MRI MRA showed subcentimeter right frontal subacute infarct with previous stroke with a TPA. No residual weakness;Most likely right carotid source Continue on Plavix and aspirin;neurology followup Patient has no residual symptoms. Severe carotid disease right carotid stenosis 95%: vascular surgery evaluation, we'll discuss with Dr. Hart Rochester, if he wants to have the surgery done tomorrow; or come back outpatient. Cardiology following.: No stress test at this point. Normal sinus rhythm on monitor Type 2 diabetes stable     LOS: 3 days   Dainelle Hun 02/04/2012, 7:50 AM

## 2012-02-05 ENCOUNTER — Other Ambulatory Visit: Payer: Medicare Other

## 2012-02-05 ENCOUNTER — Ambulatory Visit: Payer: Medicare Other | Admitting: Vascular Surgery

## 2012-02-07 ENCOUNTER — Encounter (HOSPITAL_COMMUNITY)
Admission: RE | Admit: 2012-02-07 | Discharge: 2012-02-07 | Disposition: A | Discharge status: Home / Self Care | Payer: Medicare Other | Source: Ambulatory Visit | Attending: Vascular Surgery | Admitting: Vascular Surgery

## 2012-02-07 ENCOUNTER — Ambulatory Visit (HOSPITAL_COMMUNITY)
Admission: RE | Admit: 2012-02-07 | Discharge: 2012-02-07 | Disposition: A | Discharge status: Home / Self Care | Payer: Medicare Other | Source: Ambulatory Visit | Attending: Anesthesiology | Admitting: Anesthesiology

## 2012-02-07 ENCOUNTER — Encounter (HOSPITAL_COMMUNITY): Payer: Self-pay

## 2012-02-07 DIAGNOSIS — I10 Essential (primary) hypertension: Secondary | ICD-10-CM | POA: Diagnosis not present

## 2012-02-07 DIAGNOSIS — Z01818 Encounter for other preprocedural examination: Secondary | ICD-10-CM | POA: Insufficient documentation

## 2012-02-07 HISTORY — DX: Inflammatory liver disease, unspecified: K75.9

## 2012-02-07 LAB — COMPREHENSIVE METABOLIC PANEL
ALT: 15 U/L (ref 0–53)
AST: 16 U/L (ref 0–37)
Alkaline Phosphatase: 67 U/L (ref 39–117)
CO2: 28 mEq/L (ref 19–32)
Chloride: 99 mEq/L (ref 96–112)
GFR calc non Af Amer: 90 mL/min — ABNORMAL LOW (ref >90)
Potassium: 4.5 mEq/L (ref 3.5–5.1)
Sodium: 139 mEq/L (ref 135–145)
Total Bilirubin: 0.3 mg/dL (ref 0.3–1.2)

## 2012-02-07 LAB — CBC
Hemoglobin: 15.9 g/dL (ref 13.0–17.0)
Platelets: 166 10*3/uL (ref 150–400)
RBC: 5.19 MIL/uL (ref 4.22–5.81)
WBC: 10.4 10*3/uL (ref 4.0–10.5)

## 2012-02-07 LAB — URINALYSIS, ROUTINE W REFLEX MICROSCOPIC
Hgb urine dipstick: NEGATIVE
Specific Gravity, Urine: 1.021 (ref 1.005–1.030)
Urobilinogen, UA: 1 mg/dL (ref 0.0–1.0)

## 2012-02-07 LAB — SURGICAL PCR SCREEN
MRSA, PCR: NEGATIVE
Staphylococcus aureus: NEGATIVE

## 2012-02-07 LAB — PROTIME-INR
INR: 0.92 (ref 0.00–1.49)
Prothrombin Time: 12.3 seconds (ref 11.6–15.2)

## 2012-02-07 LAB — URINE MICROSCOPIC-ADD ON

## 2012-02-07 MED ORDER — CEFUROXIME SODIUM 1.5 G IJ SOLR
1.5000 g | INTRAMUSCULAR | Status: AC
  Administered 2012-02-08: 1.5 g via INTRAVENOUS
  Filled 2012-02-07: qty 1.5

## 2012-02-07 NOTE — Pre-Procedure Instructions (Signed)
20 Samuel Berger  02/07/2012   Your procedure is scheduled on:  02-08-2012  Report to [facility] at 5:30  AM.  Call this number if you have problems the morning of surgery: [phone]   Remember:   Do not eat food or drink:After Midnight.  .    Take these medicines the morning of surgery with A SIP OF WATER: amlodipine,aspirin,   Do not wear jewelry  Do not wear lotions, powders, or perfumes  Do not shave 48 hours prior to surgery. Men may shave face and neck.  Do not bring valuables to the hospital.  Contacts, dentures or bridgework may not be worn into surgery.  Leave suitcase in the car. After surgery it may be brought to your room.   For patients admitted to the hospital, checkout time is 11:00 AM the day of discharge.   Patients discharged the day of surgery will not be allowed to drive home.     Special Instructions: Shower using CHG 2 nights before surgery and the night before surgery.  If you shower the day of surgery use CHG.  Use special wash - you have one bottle of CHG for all showers.  You should use approximately 1/3 of the bottle for each shower.   Please read over the following fact sheets that you were given: Pain Booklet, Coughing and Deep Breathing, Blood Transfusion Information, MRSA Information and Surgical Site Infection Prevention

## 2012-02-08 ENCOUNTER — Encounter (HOSPITAL_COMMUNITY): Payer: Self-pay | Admitting: Anesthesiology

## 2012-02-08 ENCOUNTER — Encounter (HOSPITAL_COMMUNITY)
Admission: RE | Disposition: A | Discharge status: Home / Self Care | Payer: Self-pay | Source: Ambulatory Visit | Attending: Vascular Surgery

## 2012-02-08 ENCOUNTER — Encounter (HOSPITAL_COMMUNITY): Payer: Self-pay | Admitting: Certified Registered Nurse Anesthetist

## 2012-02-08 ENCOUNTER — Inpatient Hospital Stay (HOSPITAL_COMMUNITY)
Admission: RE | Admit: 2012-02-08 | Discharge: 2012-02-09 | DRG: 039 | Disposition: A | Discharge status: Home / Self Care | Payer: Medicare Other | Source: Ambulatory Visit | Attending: Vascular Surgery | Admitting: Vascular Surgery

## 2012-02-08 ENCOUNTER — Telehealth: Payer: Self-pay | Admitting: Vascular Surgery

## 2012-02-08 ENCOUNTER — Ambulatory Visit (HOSPITAL_COMMUNITY): Payer: Medicare Other | Admitting: Anesthesiology

## 2012-02-08 DIAGNOSIS — Z01812 Encounter for preprocedural laboratory examination: Secondary | ICD-10-CM

## 2012-02-08 DIAGNOSIS — I6521 Occlusion and stenosis of right carotid artery: Secondary | ICD-10-CM

## 2012-02-08 DIAGNOSIS — Z8673 Personal history of transient ischemic attack (TIA), and cerebral infarction without residual deficits: Secondary | ICD-10-CM | POA: Diagnosis not present

## 2012-02-08 DIAGNOSIS — K219 Gastro-esophageal reflux disease without esophagitis: Secondary | ICD-10-CM | POA: Diagnosis not present

## 2012-02-08 DIAGNOSIS — I63239 Cerebral infarction due to unspecified occlusion or stenosis of unspecified carotid arteries: Secondary | ICD-10-CM | POA: Diagnosis not present

## 2012-02-08 DIAGNOSIS — I10 Essential (primary) hypertension: Secondary | ICD-10-CM | POA: Diagnosis not present

## 2012-02-08 DIAGNOSIS — I2510 Atherosclerotic heart disease of native coronary artery without angina pectoris: Secondary | ICD-10-CM | POA: Diagnosis not present

## 2012-02-08 DIAGNOSIS — I6529 Occlusion and stenosis of unspecified carotid artery: Principal | ICD-10-CM | POA: Diagnosis present

## 2012-02-08 DIAGNOSIS — I70309 Unspecified atherosclerosis of unspecified type of bypass graft(s) of the extremities, unspecified extremity: Secondary | ICD-10-CM | POA: Diagnosis not present

## 2012-02-08 HISTORY — PX: ENDARTERECTOMY: SHX5162

## 2012-02-08 HISTORY — PX: PATCH ANGIOPLASTY: SHX6230

## 2012-02-08 LAB — GLUCOSE, CAPILLARY
Glucose-Capillary: 107 mg/dL — ABNORMAL HIGH (ref 70–99)
Glucose-Capillary: 81 mg/dL (ref 70–99)

## 2012-02-08 SURGERY — ENDARTERECTOMY, CAROTID
Anesthesia: General | Site: Neck | Laterality: Right | Wound class: Clean

## 2012-02-08 MED ORDER — FENTANYL CITRATE 0.05 MG/ML IJ SOLN
INTRAMUSCULAR | Status: DC | PRN
  Administered 2012-02-08: 100 ug via INTRAVENOUS
  Administered 2012-02-08: 50 ug via INTRAVENOUS
  Administered 2012-02-08: 100 ug via INTRAVENOUS
  Administered 2012-02-08 (×2): 50 ug via INTRAVENOUS

## 2012-02-08 MED ORDER — POTASSIUM CHLORIDE CRYS ER 20 MEQ PO TBCR: 20.0000 meq | EXTENDED_RELEASE_TABLET | Freq: Once | ORAL | Status: AC | PRN

## 2012-02-08 MED ORDER — PROPOFOL 10 MG/ML IV BOLUS
INTRAVENOUS | Status: DC | PRN
  Administered 2012-02-08: 140 mg via INTRAVENOUS

## 2012-02-08 MED ORDER — LACTATED RINGERS IV SOLN
INTRAVENOUS | Status: DC | PRN
  Administered 2012-02-08: 08:00:00 via INTRAVENOUS

## 2012-02-08 MED ORDER — NEOSTIGMINE METHYLSULFATE 1 MG/ML IJ SOLN
INTRAMUSCULAR | Status: DC | PRN
  Administered 2012-02-08: 4 mg via INTRAVENOUS

## 2012-02-08 MED ORDER — LISINOPRIL 5 MG PO TABS
5.0000 mg | ORAL_TABLET | Freq: Every day | ORAL | Status: DC
  Filled 2012-02-08 (×2): qty 1

## 2012-02-08 MED ORDER — HEPARIN SODIUM (PORCINE) 5000 UNIT/ML IJ SOLN
INTRAMUSCULAR | Status: DC | PRN
  Administered 2012-02-08: 08:00:00

## 2012-02-08 MED ORDER — SODIUM CHLORIDE 0.9 % IV SOLN: INTRAVENOUS | Status: DC

## 2012-02-08 MED ORDER — HEMOSTATIC AGENTS (NO CHARGE) OPTIME
TOPICAL | Status: DC | PRN
  Administered 2012-02-08: 1

## 2012-02-08 MED ORDER — ALUM & MAG HYDROXIDE-SIMETH 200-200-20 MG/5ML PO SUSP: 15.0000 mL | ORAL | Status: DC | PRN

## 2012-02-08 MED ORDER — LACTATED RINGERS IV SOLN
INTRAVENOUS | Status: DC | PRN
  Administered 2012-02-08 (×2): via INTRAVENOUS

## 2012-02-08 MED ORDER — METOPROLOL TARTRATE 1 MG/ML IV SOLN: 2.0000 mg | INTRAVENOUS | Status: DC | PRN

## 2012-02-08 MED ORDER — HEPARIN SODIUM (PORCINE) 1000 UNIT/ML IJ SOLN
INTRAMUSCULAR | Status: DC | PRN
  Administered 2012-02-08: 6000 [IU] via INTRAVENOUS

## 2012-02-08 MED ORDER — SENNOSIDES-DOCUSATE SODIUM 8.6-50 MG PO TABS
1.0000 | ORAL_TABLET | Freq: Every evening | ORAL | Status: DC | PRN
  Filled 2012-02-08: qty 1

## 2012-02-08 MED ORDER — PHENYLEPHRINE HCL 10 MG/ML IJ SOLN
10.0000 mg | INTRAVENOUS | Status: DC | PRN
  Administered 2012-02-08: 50 ug/min via INTRAVENOUS

## 2012-02-08 MED ORDER — MIDAZOLAM HCL 5 MG/5ML IJ SOLN
INTRAMUSCULAR | Status: DC | PRN
  Administered 2012-02-08: 2 mg via INTRAVENOUS

## 2012-02-08 MED ORDER — HETASTARCH-ELECTROLYTES 6 % IV SOLN
INTRAVENOUS | Status: AC
  Filled 2012-02-08: qty 500

## 2012-02-08 MED ORDER — MAGNESIUM SULFATE 40 MG/ML IJ SOLN
2.0000 g | Freq: Once | INTRAMUSCULAR | Status: AC | PRN
  Filled 2012-02-08: qty 50

## 2012-02-08 MED ORDER — EPHEDRINE SULFATE 50 MG/ML IJ SOLN
INTRAMUSCULAR | Status: DC | PRN
  Administered 2012-02-08 (×2): 5 mg via INTRAVENOUS

## 2012-02-08 MED ORDER — PANTOPRAZOLE SODIUM 40 MG PO TBEC: 40.0000 mg | DELAYED_RELEASE_TABLET | Freq: Every day | ORAL | Status: DC

## 2012-02-08 MED ORDER — DOCUSATE SODIUM 100 MG PO CAPS: 100.0000 mg | ORAL_CAPSULE | Freq: Every day | ORAL | Status: DC

## 2012-02-08 MED ORDER — PHENOL 1.4 % MT LIQD
1.0000 | OROMUCOSAL | Status: DC | PRN
  Administered 2012-02-08: 1 via OROMUCOSAL
  Filled 2012-02-08: qty 177

## 2012-02-08 MED ORDER — OXYCODONE-ACETAMINOPHEN 5-325 MG PO TABS: 1.0000 | ORAL_TABLET | ORAL | Status: DC | PRN

## 2012-02-08 MED ORDER — BISACODYL 5 MG PO TBEC: 5.0000 mg | DELAYED_RELEASE_TABLET | Freq: Every day | ORAL | Status: DC | PRN

## 2012-02-08 MED ORDER — SODIUM CHLORIDE 0.9 % IV SOLN: 500.0000 mL | Freq: Once | INTRAVENOUS | Status: AC | PRN

## 2012-02-08 MED ORDER — PIOGLITAZONE HCL 30 MG PO TABS
30.0000 mg | ORAL_TABLET | Freq: Every day | ORAL | Status: DC
  Filled 2012-02-08 (×2): qty 1

## 2012-02-08 MED ORDER — HETASTARCH-ELECTROLYTES 6 % IV SOLN
500.0000 mL | Freq: Once | INTRAVENOUS | Status: AC
  Administered 2012-02-08: 500 mL via INTRAVENOUS

## 2012-02-08 MED ORDER — ACETAMINOPHEN 650 MG RE SUPP: 325.0000 mg | RECTAL | Status: DC | PRN

## 2012-02-08 MED ORDER — ASPIRIN 81 MG PO TBEC: 81.0000 mg | DELAYED_RELEASE_TABLET | Freq: Every day | ORAL | Status: DC

## 2012-02-08 MED ORDER — AMLODIPINE BESYLATE 10 MG PO TABS
10.0000 mg | ORAL_TABLET | Freq: Every day | ORAL | Status: DC
  Filled 2012-02-08 (×2): qty 1

## 2012-02-08 MED ORDER — HYDRALAZINE HCL 20 MG/ML IJ SOLN: 10.0000 mg | INTRAMUSCULAR | Status: DC | PRN

## 2012-02-08 MED ORDER — LIDOCAINE HCL (PF) 1 % IJ SOLN
INTRAMUSCULAR | Status: AC
  Filled 2012-02-08: qty 30

## 2012-02-08 MED ORDER — ROCURONIUM BROMIDE 100 MG/10ML IV SOLN
INTRAVENOUS | Status: DC | PRN
  Administered 2012-02-08: 50 mg via INTRAVENOUS

## 2012-02-08 MED ORDER — GLYCOPYRROLATE 0.2 MG/ML IJ SOLN
INTRAMUSCULAR | Status: DC | PRN
  Administered 2012-02-08: .6 mg via INTRAVENOUS

## 2012-02-08 MED ORDER — DOPAMINE-DEXTROSE 3.2-5 MG/ML-% IV SOLN: 3.0000 ug/kg/min | INTRAVENOUS | Status: DC

## 2012-02-08 MED ORDER — ONDANSETRON HCL 4 MG/2ML IJ SOLN: 4.0000 mg | Freq: Once | INTRAMUSCULAR | Status: DC | PRN

## 2012-02-08 MED ORDER — 0.9 % SODIUM CHLORIDE (POUR BTL) OPTIME
TOPICAL | Status: DC | PRN
  Administered 2012-02-08: 2000 mL

## 2012-02-08 MED ORDER — DEXTROSE 5 % IV SOLN
1.5000 g | Freq: Two times a day (BID) | INTRAVENOUS | Status: AC
  Administered 2012-02-08 – 2012-02-09 (×2): 1.5 g via INTRAVENOUS
  Filled 2012-02-08 (×2): qty 1.5

## 2012-02-08 MED ORDER — PROTAMINE SULFATE 10 MG/ML IV SOLN
INTRAVENOUS | Status: DC | PRN
  Administered 2012-02-08: 20 mg via INTRAVENOUS
  Administered 2012-02-08: 5 mg via INTRAVENOUS
  Administered 2012-02-08: 25 mg via INTRAVENOUS

## 2012-02-08 MED ORDER — SODIUM CHLORIDE 0.9 % IV SOLN
INTRAVENOUS | Status: DC
  Administered 2012-02-08: 20:00:00 via INTRAVENOUS
  Administered 2012-02-08: 125 mL via INTRAVENOUS

## 2012-02-08 MED ORDER — CLOPIDOGREL BISULFATE 75 MG PO TABS
75.0000 mg | ORAL_TABLET | Freq: Every day | ORAL | Status: DC
  Filled 2012-02-08 (×2): qty 1

## 2012-02-08 MED ORDER — ONDANSETRON HCL 4 MG/2ML IJ SOLN
INTRAMUSCULAR | Status: DC | PRN
  Administered 2012-02-08: 4 mg via INTRAVENOUS

## 2012-02-08 MED ORDER — ASPIRIN EC 325 MG PO TBEC: 325.0000 mg | DELAYED_RELEASE_TABLET | Freq: Every day | ORAL | Status: DC

## 2012-02-08 MED ORDER — ARTIFICIAL TEARS OP OINT
TOPICAL_OINTMENT | OPHTHALMIC | Status: DC | PRN
  Administered 2012-02-08: 1 via OPHTHALMIC

## 2012-02-08 MED ORDER — HYDROMORPHONE HCL PF 1 MG/ML IJ SOLN: 0.2500 mg | INTRAMUSCULAR | Status: DC | PRN

## 2012-02-08 MED ORDER — GUAIFENESIN-DM 100-10 MG/5ML PO SYRP: 15.0000 mL | ORAL_SOLUTION | ORAL | Status: DC | PRN

## 2012-02-08 MED ORDER — ACETAMINOPHEN 10 MG/ML IV SOLN: 1000.0000 mg | Freq: Once | INTRAVENOUS | Status: DC | PRN

## 2012-02-08 MED ORDER — LIDOCAINE HCL (CARDIAC) 20 MG/ML IV SOLN
INTRAVENOUS | Status: DC | PRN
  Administered 2012-02-08: 40 mg via INTRAVENOUS

## 2012-02-08 MED ORDER — ATORVASTATIN CALCIUM 40 MG PO TABS
40.0000 mg | ORAL_TABLET | Freq: Every day | ORAL | Status: DC
  Filled 2012-02-08 (×2): qty 1

## 2012-02-08 MED ORDER — LABETALOL HCL 5 MG/ML IV SOLN: 10.0000 mg | INTRAVENOUS | Status: DC | PRN

## 2012-02-08 MED ORDER — ACETAMINOPHEN 325 MG PO TABS: 325.0000 mg | ORAL_TABLET | ORAL | Status: DC | PRN

## 2012-02-08 MED ORDER — MORPHINE SULFATE 2 MG/ML IJ SOLN: 2.0000 mg | INTRAMUSCULAR | Status: DC | PRN

## 2012-02-08 MED ORDER — ONDANSETRON HCL 4 MG/2ML IJ SOLN: 4.0000 mg | Freq: Four times a day (QID) | INTRAMUSCULAR | Status: DC | PRN

## 2012-02-08 SURGICAL SUPPLY — 49 items
CANISTER SUCTION 2500CC (MISCELLANEOUS) ×2 IMPLANT
CATH ROBINSON RED A/P 18FR (CATHETERS) ×2 IMPLANT
CATH SUCT 10FR WHISTLE TIP (CATHETERS) ×2 IMPLANT
CLIP TI MEDIUM 24 (CLIP) ×2 IMPLANT
CLIP TI WIDE RED SMALL 24 (CLIP) ×2 IMPLANT
CLOTH BEACON ORANGE TIMEOUT ST (SAFETY) ×2 IMPLANT
COVER SURGICAL LIGHT HANDLE (MISCELLANEOUS) ×2 IMPLANT
CRADLE DONUT ADULT HEAD (MISCELLANEOUS) ×2 IMPLANT
DECANTER SPIKE VIAL GLASS SM (MISCELLANEOUS) IMPLANT
DRAIN HEMOVAC 1/8 X 5 (WOUND CARE) ×1 IMPLANT
DRAPE WARM FLUID 44X44 (DRAPE) ×2 IMPLANT
DRSG COVADERM 4X8 (GAUZE/BANDAGES/DRESSINGS) ×1 IMPLANT
ELECT REM PT RETURN 9FT ADLT (ELECTROSURGICAL) ×2
ELECTRODE REM PT RTRN 9FT ADLT (ELECTROSURGICAL) ×1 IMPLANT
EVACUATOR SILICONE 100CC (DRAIN) ×1 IMPLANT
GLOVE BIOGEL PI IND STRL 6.5 (GLOVE) IMPLANT
GLOVE BIOGEL PI IND STRL 7.5 (GLOVE) IMPLANT
GLOVE BIOGEL PI INDICATOR 6.5 (GLOVE) ×2
GLOVE BIOGEL PI INDICATOR 7.5 (GLOVE) ×1
GLOVE ECLIPSE 6.5 STRL STRAW (GLOVE) ×1 IMPLANT
GLOVE SS BIOGEL STRL SZ 7 (GLOVE) ×1 IMPLANT
GLOVE SUPERSENSE BIOGEL SZ 7 (GLOVE) ×2
GLOVE SURG SS PI 6.5 STRL IVOR (GLOVE) ×2 IMPLANT
GOWN STRL NON-REIN LRG LVL3 (GOWN DISPOSABLE) ×7 IMPLANT
INSERT FOGARTY SM (MISCELLANEOUS) ×2 IMPLANT
KIT BASIN OR (CUSTOM PROCEDURE TRAY) ×2 IMPLANT
KIT ROOM TURNOVER OR (KITS) ×2 IMPLANT
NEEDLE 22X1 1/2 (OR ONLY) (NEEDLE) IMPLANT
NS IRRIG 1000ML POUR BTL (IV SOLUTION) ×4 IMPLANT
PACK CAROTID (CUSTOM PROCEDURE TRAY) ×2 IMPLANT
PAD ARMBOARD 7.5X6 YLW CONV (MISCELLANEOUS) ×4 IMPLANT
PATCH HEMASHIELD 8X75 (Vascular Products) ×1 IMPLANT
SHUNT CAROTID BYPASS 12FRX15.5 (VASCULAR PRODUCTS) IMPLANT
SPECIMEN JAR SMALL (MISCELLANEOUS) ×2 IMPLANT
SPONGE GAUZE 4X4 12PLY (GAUZE/BANDAGES/DRESSINGS) ×1 IMPLANT
SUT ETHILON 3 0 PS 1 (SUTURE) ×1 IMPLANT
SUT PROLENE 6 0 CC (SUTURE) ×2 IMPLANT
SUT PROLENE 7 0 BV 1 (SUTURE) ×1 IMPLANT
SUT PROLENE 7 0 BV1 MDA (SUTURE) ×1 IMPLANT
SUT SILK 2 0 FS (SUTURE) ×2 IMPLANT
SUT VIC AB 2-0 CT1 27 (SUTURE) ×2
SUT VIC AB 2-0 CT1 TAPERPNT 27 (SUTURE) ×1 IMPLANT
SUT VIC AB 3-0 X1 27 (SUTURE) ×2 IMPLANT
SYR CONTROL 10ML LL (SYRINGE) IMPLANT
TAPE CLOTH SURG 4X10 WHT LF (GAUZE/BANDAGES/DRESSINGS) ×1 IMPLANT
TOWEL OR 17X24 6PK STRL BLUE (TOWEL DISPOSABLE) ×2 IMPLANT
TOWEL OR 17X26 10 PK STRL BLUE (TOWEL DISPOSABLE) ×2 IMPLANT
VITASURE W/FLOW CONTROL (MISCELLANEOUS) ×1 IMPLANT
WATER STERILE IRR 1000ML POUR (IV SOLUTION) ×2 IMPLANT

## 2012-02-08 NOTE — Transfer of Care (Signed)
Immediate Anesthesia Transfer of Care Note  Patient: Samuel Berger  Procedure(s) Performed: Procedure(s) (LRB) with comments: ENDARTERECTOMY CAROTID (Right) PATCH ANGIOPLASTY (Right) - with dacron patch angioplasty  Patient Location: PACU  Anesthesia Type:General  Level of Consciousness: awake, alert , oriented and patient cooperative  Airway & Oxygen Therapy: Patient Spontanous Breathing and Patient connected to face mask oxygen  Post-op Assessment: Report given to PACU RN, Post -op Vital signs reviewed and stable and Patient moving all extremities X 4, tongue midline  Post vital signs: Reviewed and stable  Complications: No apparent anesthesia complications

## 2012-02-08 NOTE — Anesthesia Preprocedure Evaluation (Signed)
Anesthesia Evaluation  Patient identified by MRN, date of birth, ID band Patient awake    Reviewed: Allergy & Precautions, H&P , NPO status , Patient's Chart, lab work & pertinent test results  Airway Mallampati: II TM Distance: >3 FB Neck ROM: Full    Dental  (+) Teeth Intact   Pulmonary  breath sounds clear to auscultation        Cardiovascular Rhythm:Regular     Neuro/Psych    GI/Hepatic   Endo/Other    Renal/GU      Musculoskeletal   Abdominal   Peds  Hematology   Anesthesia Other Findings   Reproductive/Obstetrics                           Anesthesia Physical Anesthesia Plan  ASA: III  Anesthesia Plan: General   Post-op Pain Management:    Induction: Intravenous  Airway Management Planned: Oral ETT  Additional Equipment: Arterial line  Intra-op Plan:   Post-operative Plan: Extubation in OR  Informed Consent: I have reviewed the patients History and Physical, chart, labs and discussed the procedure including the risks, benefits and alternatives for the proposed anesthesia with the patient or authorized representative who has indicated his/her understanding and acceptance.   Dental advisory given  Plan Discussed with: CRNA and Surgeon  Anesthesia Plan Comments: (R. 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

## 2012-02-08 NOTE — Progress Notes (Signed)
RN states she does not think pt is appropriate for tx to rm, and to hold him in pacu longer.

## 2012-02-08 NOTE — H&P (View-Only) (Signed)
Subjective: Interval History: The patient was admitted yesterday with recurrent neurologic events. I have reviewed his MR in MRA and CT angiogram from her admission one month ago. Also reviewed his medical records from his October admission. At that time he had an episode of aphasia and right-sided paralysis. Workup included CT angiogram which showed 95% right internal carotid artery stenosis with a very small internal carotid artery above the bifurcation and significant intracranial disease on the right. He had minimal disease in his left internal carotid artery. He had very small vertebral and basilar artery. He did receive TPA and had complete resolution. He was seen at that time and consultation with Dr. JD Lawson who felt that he would require right endarterectomy due to his critical stenosis after approximately one month resolution from his acute left brain stroke. There patient reports that yesterday while at a restaurant he had an episode where he began to sweat and felt very flushed and faint and hot. He reports that for approximately 30 seconds he was a very faint. This resolved very quickly and he was able to walk without difficulty following the procedure and had no focal deficits. He was admitted back to the hospital and MRI shows a new right posterior frontal subcortical white matter stroke. I was asked to see him for further discussion.  Objective: Vital signs in last 24 hours: Temp:  [97.4 F (36.3 C)-98.2 F (36.8 C)] 97.4 F (36.3 C) (11/02 0645) Pulse Rate:  [88-97] 97  (11/02 0645) Resp:  [19] 19  (11/02 0645) BP: (118-129)/(52-54) 129/54 mmHg (11/02 0645) SpO2:  [93 %-99 %] 93 % (11/02 0645)  Intake/Output from previous day:   Intake/Output this shift: Total I/O In: 480 [P.O.:480] Out: -   Alert oriented completely intact neurologically. Normal carotid pulsations bilaterally Respirations nonlabored Skin without ulcers or rashes Palpable radial pulses bilaterally  Lab  Results:  Basename 02/02/12 0620 02/01/12 1339 02/01/12 1322  WBC 9.4 -- 13.6*  HGB 14.1 15.6 --  HCT 42.0 46.0 --  PLT 149* -- 147*   BMET  Basename 02/02/12 0620 02/01/12 1339  NA 138 140  K 4.4 4.2  CL 105 101  CO2 24 --  GLUCOSE 127* 131*  BUN 14 19  CREATININE 0.75 1.10  CALCIUM 8.7 --    Studies/Results: Ct Angio Head W/cm &/or Wo Cm  01/05/2012  *RADIOLOGY REPORT*  Clinical Data:  Code stroke.  Left MCA territory.  CT ANGIOGRAPHY HEAD AND NECK  Technique:  Multidetector CT imaging of the head and neck was performed using the standard protocol during bolus administration of intravenous contrast.  Multiplanar CT image reconstructions including MIPs were obtained to evaluate the vascular anatomy. Carotid stenosis measurements (when applicable) are obtained utilizing NASCET criteria, using the distal internal carotid diameter as the denominator.  Contrast: 50mL OMNIPAQUE IOHEXOL 350 MG/ML SOLN  Comparison:  Head CT same day  CTA NECK  Findings:  Lung apices are clear except for mild scarring.  No superior mediastinal pathology.  There is atherosclerosis of the aorta.  Branching pattern of brachiocephalic vessels is normal.  No origin stenoses.  The right common carotid artery is widely patent to the bifurcation.  There is severe stenosis of the proximal ICA with a string sign.  This consistent with 95% or greater stenosis.  Beyond that, the cervical internal carotid artery shows flow but is markedly diminished in caliber because of the reduced inflow.  The left common carotid arteries widely patent to the bifurcation. There is atherosclerotic   disease at the carotid bifurcation. Minimal diameter of the proximal internal carotid artery is 4 mm. Compared to a more distal cervical ICA diameter of 5 mm, this indicates a 20% stenosis.  There is atherosclerosis of the right vertebral artery origin. This is a small vessel.  Therefore, the stenosis is difficult to accurately measure.  I estimate  this at 50%.  The left vertebral artery is also a small vessel.  There does not appear to be any stenosis at its origin.  Both vertebral arteries, though a small, are patent through the cervical region to the foramen magnum.  The soft tissues of the neck are unremarkable except for the thyroid nodule the lower pole on the left measuring 2 cm in diameter.  Ultrasound would be suggested to evaluate this if clinically appropriate.   Review of the MIP images confirms the above findings.  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  Beyond that, the vessel is markedly narrowed because of diminished inflow.  20% stenosis of the proximal left internal carotid artery.  Both vertebral arteries are diminutive vessels but are patent through the neck.  I estimate there is a 50% stenosis of the proximal right vertebral artery.  CTA HEAD  Findings:  Both internal carotid arteries are patent through the skull base, with the right being a diminutive vessel because of reduced inflow.  There is extensive atherosclerotic calcification in both carotid siphon regions.  Stenosis in that region is estimated at 50-70% bilaterally.  The right anterior and middle cerebral vessels are patent without proximal stenosis, aneurysm or vascular malformation.  The left anterior and middle cerebral vessels are patent.  The M1 segment shows pronounced irregularity.  Stenosis at the M1 origin is estimated at 50%.  I do not see an intraluminal filling defect at this moment however.  No missing distal branches are appreciated.  The right vertebral artery is a small vessel that appears to be patent to the basilar.  The left vertebral artery is a small vessel patent to the foramen magnum but without demonstrable flow beyond that.  There is reconstitution or retrograde flow within the distal left vertebral.  The basilar artery is a small vessel but does show flow.  Both posterior cerebral arteries receive there  supply from the anterior circulation.  The brain shows old small vessel infarctions within the deep white matter.  There is no identifiable acute infarction.  No intracranial hemorrhage.   Review of the MIP images confirms the above findings.  IMPRESSION: The right internal carotid artery is diminutive because of reduced inflow.  Both internal carotid arteries are patent through the siphon region.  There is extensive calcific atherosclerosis of both carotid siphons with stenoses on the order of 50-70% bilaterally.  Both anterior and middle cerebral vessels are patent.  No evidence of intraluminal filling defect.  There is irregularity of the M1 segment on the left with stenosis of the left M1 segment of 50%. No missing distal branch vessels are identified.  Both vertebral arteries are diminutive.  The right vertebral artery is patent to the basilar.  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  There is reconstituted or retrograde flow in the distal left vertebral artery.  The basilar artery is a very diminutive vessel but does show flow.  This vessel supplies both superior cerebellar arteries.  Both posterior cerebral arteries receive most to their supply from the anterior circulation.   Original Report Authenticated By:   MARK E. SHOGRY, M.D.    Ct Head Wo Contrast  01/05/2012  *RADIOLOGY REPORT*  Clinical Data: Code stroke.  Right-sided weakness.  Right facial droop.  CT HEAD WITHOUT CONTRAST  Technique:  Contiguous axial images were obtained from the base of the skull through the vertex without contrast.  Comparison: 06/23/2008  Findings: The cerebellum, brain stem, cerebral peduncles, and thalami appear normal.  A small remote lacunar infarct involving the anterior limb of the left internal capsule and head of the left caudate nucleus is observed.  There is also a small lacunar infarct in the head of the right caudate nucleus appears old.  The basal ganglia appear otherwise  unremarkable.  Ventricular system and basilar cisterns unremarkable.  Mild chronic left maxillary sinusitis noted. No intracranial hemorrhage, mass lesion, or acute infarction is identified.  IMPRESSION:  1.  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 No acute intracranial findings. 2.  Mild chronic left maxillary sinusitis.  I discussed these findings by telephone with Dr. Steven Rancour at 6:11 p.m. on 01/05/2012.   Original Report Authenticated By: WALTER D. LIEBKEMANN, M.D.    Ct Angio Neck W/cm &/or Wo/cm  01/05/2012  *RADIOLOGY REPORT*  Clinical Data:  Code stroke.  Left MCA territory.  CT ANGIOGRAPHY HEAD AND NECK  Technique:  Multidetector CT imaging of the head and neck was performed using the standard protocol during bolus administration of intravenous contrast.  Multiplanar CT image reconstructions including MIPs were obtained to evaluate the vascular anatomy. Carotid stenosis measurements (when applicable) are obtained utilizing NASCET criteria, using the distal internal carotid diameter as the denominator.  Contrast: 50mL OMNIPAQUE IOHEXOL 350 MG/ML SOLN  Comparison:  Head CT same day  CTA NECK  Findings:  Lung apices are clear except for mild scarring.  No superior mediastinal pathology.  There is atherosclerosis of the aorta.  Branching pattern of brachiocephalic vessels is normal.  No origin stenoses.  The right common carotid artery is widely patent to the bifurcation.  There is severe stenosis of the proximal ICA with a string sign.  This consistent with 95% or greater stenosis.  Beyond that, the cervical internal carotid artery shows flow but is markedly diminished in caliber because of the reduced inflow.  The left common carotid arteries widely patent to the bifurcation. There is atherosclerotic disease at the carotid bifurcation. Minimal diameter of the proximal internal carotid artery is 4 mm. Compared to a  more distal cervical ICA diameter of 5 mm, this indicates a 20% stenosis.  There is atherosclerosis of the right vertebral artery origin. This is a small vessel.  Therefore, the stenosis is difficult to accurately measure.  I estimate this at 50%.  The left vertebral artery is also a small vessel.  There does not appear to be any stenosis at its origin.  Both vertebral arteries, though a small, are patent through the cervical region to the foramen magnum.  The soft tissues of the neck are unremarkable except for the thyroid nodule the lower pole on the left measuring 2 cm in diameter.  Ultrasound would be suggested to evaluate this if clinically appropriate.   Review of the MIP images confirms the above findings.  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  Beyond that, the vessel is markedly narrowed because of diminished inflow.  20% stenosis of the proximal left internal carotid artery.  Both vertebral arteries are diminutive   vessels but are patent through the neck.  I estimate there is a 50% stenosis of the proximal right vertebral artery.  CTA HEAD  Findings:  Both internal carotid arteries are patent through the skull base, with the right being a diminutive vessel because of reduced inflow.  There is extensive atherosclerotic calcification in both carotid siphon regions.  Stenosis in that region is estimated at 50-70% bilaterally.  The right anterior and middle cerebral vessels are patent without proximal stenosis, aneurysm or vascular malformation.  The left anterior and middle cerebral vessels are patent.  The M1 segment shows pronounced irregularity.  Stenosis at the M1 origin is estimated at 50%.  I do not see an intraluminal filling defect at this moment however.  No missing distal branches are appreciated.  The right vertebral artery is a small vessel that appears to be patent to the basilar.  The left vertebral artery is a small vessel patent to the foramen  magnum but without demonstrable flow beyond that.  There is reconstitution or retrograde flow within the distal left vertebral.  The basilar artery is a small vessel but does show flow.  Both posterior cerebral arteries receive there supply from the anterior circulation.  The brain shows old small vessel infarctions within the deep white matter.  There is no identifiable acute infarction.  No intracranial hemorrhage.   Review of the MIP images confirms the above findings.  IMPRESSION: The right internal carotid artery is diminutive because of reduced inflow.  Both internal carotid arteries are patent through the siphon region.  There is extensive calcific atherosclerosis of both carotid siphons with stenoses on the order of 50-70% bilaterally.  Both anterior and middle cerebral vessels are patent.  No evidence of intraluminal filling defect.  There is irregularity of the M1 segment on the left with stenosis of the left M1 segment of 50%. No missing distal branch vessels are identified.  Both vertebral arteries are diminutive.  The right vertebral artery is patent to the basilar.  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  There is reconstituted or retrograde flow in the distal left vertebral artery.  The basilar artery is a very diminutive vessel but does show flow.  This vessel supplies both superior cerebellar arteries.  Both posterior cerebral arteries receive most to their supply from the anterior circulation.   Original Report Authenticated By: MARK E. SHOGRY, M.D.    Mr Mra Head Wo Contrast  02/02/2012  *RADIOLOGY REPORT*  Clinical Data:  Syncopal episode.  Recent stroke.  MRI HEAD WITHOUT CONTRAST MRA HEAD WITHOUT CONTRAST  Technique:  Multiplanar, multiecho pulse sequences of the brain and surrounding structures were obtained without intravenous contrast. Angiographic images of the head were obtained using MRA technique without contrast.  Comparison:  MRI head January 06, 2012.   CT angiography of the neck and head 01/05/2012  MRI HEAD  Findings:  There is a new cluster of acute subcentimeter infarcts affecting the right posterior frontal subcortical white matter, not present previously.  Previous left cerebellar stroke is resolving. Baseline atrophy with chronic microvascular ischemic change noted. Remote infarcts in the right cerebellum and basal ganglia bilaterally.  Moderate chronic small vessel ischemic change throughout the cerebral hemispheric white matter.  Chronic hemorrhage is associated with the bilateral basal ganglia infarcts. No large vessel cortical infarct.  No mass lesion, hydrocephalus, or acute hemorrhage.  No extra-axial fluid.  Midline structures unremarkable.  No acute sinus or mastoid disease.  Calvarium and skull base   intact.  There is diminished flow related enhancement in the right internal carotid artery at the skull base.  This was present previously and will be described further on MRA.  Prior MRI brain did not show an acute right hemisphere infarct.  IMPRESSION: New areas of acute subcentimeter infarction affecting the right posterior frontal subcortical white matter not present previously. It is unclear if this relates to hypoperfusion of the right hemisphere, small vessel thrombosis, or an embolic phenomenon.  Diminished flow related enhancement right internal carotid artery.  MRA HEAD  Findings: There is poor flow related enhancement in the cervical, petrous, cavernous, and supraclinoid right internal carotid artery. There is enough flow related enhancement on the right to suggest the findings represent slow flow rather than thrombosis although confirmation of this would require formal catheter angiogram. The findings are suggestive of a so called "string sign".  The left internal carotid artery shows no focal irregularity or flow limiting stenosis  in the cervical, petrous, cavernous region. There is a estimated 50% percent stenosis of the supraclinoid ICA  on the left.  Diminished flow related enhancement in the proximal right anterior cerebral artery and proximal left middle cerebral artery.  There is likely collateral flow from left to right augmenting the right anterior circulation.  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  Both posterior cerebral arteries originate from the carotids accounting for the hypoplasia.  The vertebral arteries are roughly equal size in the neck.  There is a focal occlusion of the left vertebral artery at the skull base with some reconstitution distally, but this vessel does not significantly contribute to basilar flow.  The right vertebral caliber decreases distal to the right PICA origin although no flow limiting stenosis is definitely observed.  IMPRESSION: Severe proximal stenosis right internal carotid artery with suspected string sign.  Collateral flow from left to right likely preserves  flow to the right anterior circulation.  Vertebrobasilar atherosclerotic change as described.  The MRA appearance is similar to that of previous CTA head/neck from 01/05/2012.   Original Report Authenticated By: John Curnes, M.D.    Mr Brain Wo Contrast  02/02/2012  *RADIOLOGY REPORT*  Clinical Data:  Syncopal episode.  Recent stroke.  MRI HEAD WITHOUT CONTRAST MRA HEAD WITHOUT CONTRAST  Technique:  Multiplanar, multiecho pulse sequences of the brain and surrounding structures were obtained without intravenous contrast. Angiographic images of the head were obtained using MRA technique without contrast.  Comparison:  MRI head January 06, 2012.  CT angiography of the neck and head 01/05/2012  MRI HEAD  Findings:  There is a new cluster of acute subcentimeter infarcts affecting the right posterior frontal subcortical white matter, not present previously.  Previous left cerebellar stroke is resolving. Baseline atrophy with chronic microvascular ischemic change noted. Remote infarcts in the right cerebellum and basal  ganglia bilaterally.  Moderate chronic small vessel ischemic change throughout the cerebral hemispheric white matter.  Chronic hemorrhage is associated with the bilateral basal ganglia infarcts. No large vessel cortical infarct.  No mass lesion, hydrocephalus, or acute hemorrhage.  No extra-axial fluid.  Midline structures unremarkable.  No acute sinus or mastoid disease.  Calvarium and skull base intact.  There is diminished flow related enhancement in the right internal carotid artery at the skull base.  This was present previously and will be described further on MRA.  Prior MRI brain did not show an acute right hemisphere infarct.  IMPRESSION: New areas of acute subcentimeter infarction affecting the right   posterior frontal subcortical white matter not present previously. It is unclear if this relates to hypoperfusion of the right hemisphere, small vessel thrombosis, or an embolic phenomenon.  Diminished flow related enhancement right internal carotid artery.  MRA HEAD  Findings: There is poor flow related enhancement in the cervical, petrous, cavernous, and supraclinoid right internal carotid artery. There is enough flow related enhancement on the right to suggest the findings represent slow flow rather than thrombosis although confirmation of this would require formal catheter angiogram. The findings are suggestive of a so called "string sign".  The left internal carotid artery shows no focal irregularity or flow limiting stenosis  in the cervical, petrous, cavernous region. There is a estimated 50% percent stenosis of the supraclinoid ICA on the left.  Diminished flow related enhancement in the proximal right anterior cerebral artery and proximal left middle cerebral artery.  There is likely collateral flow from left to right augmenting the right anterior circulation.  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  Both posterior cerebral arteries originate from the carotids  accounting for the hypoplasia.  The vertebral arteries are roughly equal size in the neck.  There is a focal occlusion of the left vertebral artery at the skull base with some reconstitution distally, but this vessel does not significantly contribute to basilar flow.  The right vertebral caliber decreases distal to the right PICA origin although no flow limiting stenosis is definitely observed.  IMPRESSION: Severe proximal stenosis right internal carotid artery with suspected string sign.  Collateral flow from left to right likely preserves  flow to the right anterior circulation.  Vertebrobasilar atherosclerotic change as described.  The MRA appearance is similar to that of previous CTA head/neck from 01/05/2012.   Original Report Authenticated By: John Curnes, M.D.    Mr Brain Wo Contrast  01/06/2012  *RADIOLOGY REPORT*  Clinical Data: The patient treated for stroke and left middle cerebral artery territory.  Follow-up.  MRI HEAD WITHOUT CONTRAST  Technique:  Multiplanar, multiecho pulse sequences of the brain and surrounding structures were obtained according to standard protocol without intravenous contrast.  Comparison: CT and CT angiography 01/05/2012  Findings: Diffusion imaging shows a cluster of sub-centimeter infarctions within the anterior to mid cerebellum on the left peripherally.  There is no significant swelling.  No hemorrhage or mass effect.  There are a few old small vessel infarctions in the right cerebellum.  The pons appears normal.  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  There is some residual hemosiderin in the area of the basal ganglia infarctions.  No large vessel territory infarction.  No mass lesion, hydrocephalus or extra-axial collection.  No pituitary mass.  No significant sinus disease. There is a small amount of fluid in the mastoid air  cells.  No skull or skull base lesion.  IMPRESSION: Cluster of sub-centimeter acute infarctions within the periphery of the lower to mid left cerebellar hemisphere.  Several old small vessel cerebellar infarctions, several old lacunar infarctions within the basal ganglia and moderate chronic small vessel changes in the hemispheric white matter.   Original Report Authenticated By: MARK E. SHOGRY, M.D.    Dg Chest Port 1 View  01/05/2012   *RADIOLOGY REPORT*  Clinical Data: Hypertension, possible aspiration  PORTABLE CHEST - 1 VIEW  Comparison: 10/28/2009  Findings: Lungs clear.  Heart size and pulmonary vascularity normal.  No effusion.    Visualized bones unremarkable.  IMPRESSION: No acute disease   Original Report Authenticated By: D. DANIEL HASSELL III, M.D.    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 I had a long discussion with the patient explaining there is no way to definitively say whether his right carotid bifurcation stenosis is responsible for any of these events. Certainly yesterday his event sounds more vagal or cardiac. Of major concern however is that he does have a new right-sided stroke 1 MRI. We'll continue to follow with you. Suspect that he will come to right carotid endarterectomy since this is perineally correctable flow issue to his brain. I did explain the significance of his hypoperfused right internal carotid artery above the bifurcation and the added complexity of surgery for correction of this. We will continue along with his cardiac evaluation   LOS: 1 day   Jameela Michna 02/02/2012, 6:20 PM    

## 2012-02-08 NOTE — Progress Notes (Signed)
This note also relates to the following rows which could not be included:  SpO2 - Cannot attach notes to unvalidated device data

## 2012-02-08 NOTE — Anesthesia Postprocedure Evaluation (Signed)
  Anesthesia Post-op Note  Patient: Samuel Berger  Procedure(s) Performed: Procedure(s) (LRB) with comments: ENDARTERECTOMY CAROTID (Right) PATCH ANGIOPLASTY (Right) - with dacron patch angioplasty  Patient Location: PACU  Anesthesia Type:General  Level of Consciousness: awake, alert  and oriented  Airway and Oxygen Therapy: Patient Spontanous Breathing and Patient connected to nasal cannula oxygen  Post-op Pain: mild  Post-op Assessment: Post-op Vital signs reviewed and Patient's Cardiovascular Status Stable  Post-op Vital Signs: stable  Complications: No apparent anesthesia complications

## 2012-02-08 NOTE — Progress Notes (Signed)
1936 CBG--81, pt given 240 apple juice, nursing will cont to monitor

## 2012-02-08 NOTE — Progress Notes (Signed)
This note also relates to the following rows which could not be included: Pulse Rate - Cannot attach notes to unvalidated device data ECG Heart Rate - Cannot attach notes to unvalidated device data Resp - Cannot attach notes to unvalidated device data BP - Cannot attach notes to unvalidated device data SpO2 - Cannot attach notes to unvalidated device data   

## 2012-02-08 NOTE — Progress Notes (Signed)
1521 pt admitted from PACU, current BP 97/50(cuff), 98/38(A-line); MD aware, txing RN stated MD communicated not to start Dop at present (see note), pt stable, A&Ox4, nursing will cont to monitor

## 2012-02-08 NOTE — Interval H&P Note (Signed)
History and Physical Interval Note:  02/08/2012 7:19 AM  Samuel Berger  has presented today for surgery, with the diagnosis of right ica stenosis  The various methods of treatment have been discussed with the patient and family. After consideration of risks, benefits and other options for treatment, the patient has consented to  Procedure(s) (LRB) with comments: ENDARTERECTOMY CAROTID (Right) as a surgical intervention .  The patient's history has been reviewed, patient examined, no change in status, stable for surgery.  I have reviewed the patient's chart and labs.  Questions were answered to the patient's satisfaction.     Josephina Gip

## 2012-02-08 NOTE — Progress Notes (Signed)
Dr Hart Rochester at bedside, aware SBP <90.  Order received to give hextend 500 cc bolus.

## 2012-02-08 NOTE — Progress Notes (Signed)
3300 RN Mardene Celeste states that she would like pt to hold in pacu longer because she and her charge nurse think his SBP is low. They both are aware Dr Hart Rochester is ok with a tx, and BP at this time however the RN would like for the 2nd bolus of hextend to be given before tx to rm.  I agree to give this bolus and will monitor.

## 2012-02-08 NOTE — Op Note (Signed)
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 Following induction of satisfactory general endotracheal anesthesia the right neck was prepped and draped in a routine sterile manner. Incision was made on the anterior border of the sternocleidomastoid muscle and carried down through the subcutaneous tissue and platysma using the Bovie. Care was taken not to injure the hypoglossal nerve.. The common internal and external carotid arteries were dissected free. There was a calcified atherosclerotic plaque at the carotid bifurcation extending up the internal carotid artery. A #10 shunt was then prepared and the patient was heparinized. The carotid vessels were occluded with vascular clamps. A longitudinal opening was made in the common carotid with a 15 blade extended up the internal carotid with the Potts scissors to a point distal to the disease. The plaque was approximately 98% % stenotic in severity. The distal vessel appeared normal. Shunt was inserted without difficulty reestablishing flow in about 2 minutes. A standard endarterectomy was performed with an eversion endarterectomy of the external carotid.  The plaque feathered off nicely but there was some thickening of the medial wall requiring 2 tacking sutures of 7-0 Prolene.. The lumen was thoroughly irrigated with heparinized saline and loose debris all carefully removed. The arterotomy was then closed with a patch using continuous 6-0 Prolene. Prior to completion of the  Closure the  shunt was removed  after approximately 30 minutes of shunt time. Flow was then reestablished up the external branch initially followed by the internal branch. Protamine was given to her reverse the heparin.there was some oozing throughout the wound requiring Vitasure to assist in hemostasis and a Jackson-Pratt drain was brought out through an inferiorly based stab wound secured with a suture. the wound was irrigated with saline and closed in layers with Vicryl ain a subcuticular fashion. Sterile dressing was applied and the patient taken to the recovery room in stable condition.  Josephina Gip, MD 02/08/2012 10:00 AM

## 2012-02-08 NOTE — Telephone Encounter (Signed)
Message copied by Margaretmary Eddy on Fri Feb 08, 2012 11:05 AM ------      Message from: Melene Plan      Created: Fri Feb 08, 2012 10:22 AM                   ----- Message -----         From: Lars Mage, PA         Sent: 02/08/2012   9:38 AM           To: Melene Plan, RN            F/U in office s/p carotid with Dr.Lawson 2 weeks

## 2012-02-08 NOTE — Progress Notes (Signed)
Utilization review completed.  

## 2012-02-08 NOTE — Progress Notes (Signed)
Dr Hart Rochester at bedside, and is aware of BP.  States he is ok with him to go to his rm at this time, and to go continue to go by [place] pressures.  States that pt may have hextend bolus 500 cc's  if SBP <90

## 2012-02-08 NOTE — Progress Notes (Signed)
RN on 3300 will not accept report on this pt.

## 2012-02-08 NOTE — Progress Notes (Addendum)
Dr Hart Rochester has stated that he wants pt to continue to tx to 3300.  He is aware of BP, and recent 2nd bolus of hextend.  Does not wish pt to have dopamine started.   He stated again that he wants to go by and treat the [place] pressure's, in pacu and once on 3300.

## 2012-02-09 LAB — CBC
Hemoglobin: 11.9 g/dL — ABNORMAL LOW (ref 13.0–17.0)
Platelets: 129 10*3/uL — ABNORMAL LOW (ref 150–400)
RBC: 3.92 MIL/uL — ABNORMAL LOW (ref 4.22–5.81)
WBC: 9.3 10*3/uL (ref 4.0–10.5)

## 2012-02-09 LAB — BASIC METABOLIC PANEL
CO2: 27 mEq/L (ref 19–32)
Glucose, Bld: 102 mg/dL — ABNORMAL HIGH (ref 70–99)
Potassium: 4 mEq/L (ref 3.5–5.1)
Sodium: 136 mEq/L (ref 135–145)

## 2012-02-09 LAB — GLUCOSE, CAPILLARY

## 2012-02-09 NOTE — Plan of Care (Signed)
Problem: Consults Goal: Diagnosis CEA/CES/AAA Stent Outcome: Completed/Met Date Met:  02/09/12 Carotid Endarterectomy (CEA)

## 2012-02-09 NOTE — Progress Notes (Signed)
VASCULAR PROGRESS NOTE  SUBJECTIVE: Comfortable. No complaints.  PHYSICAL EXAM: Filed Vitals:   02/08/12 2359 02/09/12 0000 02/09/12 0325 02/09/12 0744  BP:  116/60 132/64   Pulse:  72 79   Temp: 98 F (36.7 C)  98.1 F (36.7 C) 98 F (36.7 C)  TempSrc: Oral  Oral Oral  Resp:  17 19   Height:      Weight:      SpO2:  98% 96%    Incision looks fine.  No focal weakness JP = 15 cc last shift  LABS: Lab Results  Component Value Date   WBC 9.3 02/09/2012   HGB 11.9* 02/09/2012   HCT 35.0* 02/09/2012   MCV 89.3 02/09/2012   PLT 129* 02/09/2012   Lab Results  Component Value Date   CREATININE 0.74 02/09/2012   CBG (last 3)   Basename 02/09/12 0324 02/08/12 2358 02/08/12 1936  GLUCAP 91 102* 81   ASSESSMENT/PLAN: 1. 1 Day Post-Op s/p: Doing well s/p right CEA 2. Home today.   Waverly Ferrari, MD, FACS Beeper: [phone] 02/09/2012

## 2012-02-09 NOTE — Progress Notes (Signed)
1610, pt d/c home per MD order, d/c instructions and prescription given, pt VSS,  Family at [facility], pt and family verbalized understanding of d/c, all  Questions answered

## 2012-02-11 ENCOUNTER — Encounter (HOSPITAL_COMMUNITY): Payer: Self-pay | Admitting: Vascular Surgery

## 2012-02-11 LAB — TYPE AND SCREEN: Unit division: 0

## 2012-02-20 DIAGNOSIS — E119 Type 2 diabetes mellitus without complications: Secondary | ICD-10-CM | POA: Diagnosis not present

## 2012-02-25 ENCOUNTER — Encounter: Payer: Self-pay | Admitting: Vascular Surgery

## 2012-02-26 ENCOUNTER — Ambulatory Visit: Payer: Medicare Other | Admitting: Vascular Surgery

## 2012-02-26 ENCOUNTER — Encounter: Payer: Self-pay | Admitting: Vascular Surgery

## 2012-02-26 VITALS — BP 132/61 | HR 84 | Ht 72.0 in | Wt 195.6 lb

## 2012-02-26 DIAGNOSIS — Z48812 Encounter for surgical aftercare following surgery on the circulatory system: Secondary | ICD-10-CM

## 2012-02-26 DIAGNOSIS — I6529 Occlusion and stenosis of unspecified carotid artery: Secondary | ICD-10-CM | POA: Insufficient documentation

## 2012-02-26 NOTE — Progress Notes (Signed)
Subjective:     Patient ID: Samuel Berger, male   DOB: 02-08-1946, 66 y.o.   MRN: [id number]  HPI this 66 year old male returns 2 weeks post right carotid endarterectomy for severe right internal carotid stenosis and a history of right and left brain CVA with resolution of symptoms. He has done well since his surgery with no hoarseness, difficulty swallowing, lateralizing weakness, aphasia, or amaurosis fugax. He has had no dizziness or syncope. He is ambulating well. He is taking an aspirin and Plavix per day.   Review of Systems     Objective:   Physical ExamBP 132/61  Pulse 84  Ht 6' (1.829 m)  Wt 195 lb 9.6 oz (88.724 kg)  BMI 26.53 kg/m2  SpO2 100%  General well-developed well-nourished male no apparent stress alert and oriented x3 Right neck incision is healing nicely with 3+ pulse and no audible bruits. Left carotid pulses 3+ no bruits. Neurologic exam normal.     Assessment:     Doing well post right carotid endarterectomy-asymptomatic at present time On aspirin and Plavix daily    Plan:     Return in 6 months with carotid duplex exam to follow patient for restenosis and look at contralateral left carotid artery.

## 2012-02-26 NOTE — Addendum Note (Signed)
Addended by: Dannielle Karvonen on: 02/26/2012 03:42 PM   Modules accepted: Orders

## 2012-03-04 DIAGNOSIS — I6529 Occlusion and stenosis of unspecified carotid artery: Secondary | ICD-10-CM | POA: Diagnosis not present

## 2012-03-04 DIAGNOSIS — I10 Essential (primary) hypertension: Secondary | ICD-10-CM | POA: Diagnosis not present

## 2012-03-04 DIAGNOSIS — I6350 Cerebral infarction due to unspecified occlusion or stenosis of unspecified cerebral artery: Secondary | ICD-10-CM | POA: Diagnosis not present

## 2012-03-04 DIAGNOSIS — E119 Type 2 diabetes mellitus without complications: Secondary | ICD-10-CM | POA: Diagnosis not present

## 2012-03-07 NOTE — Discharge Summary (Signed)
[facility] Discharge Summary   Patient ID:  Samuel Berger MRN: [id number] DOB/AGE: 67-Jan-1947 67 y.o.  Admit date: 02/08/2012 Discharge date: 03/07/2012 Date of Surgery: 02/08/2012 Surgeon: Surgeon(s): Pryor Ochoa, MD  Admission Diagnosis: right ica stenosis  Discharge Diagnoses:  right ica stenosis  Secondary Diagnoses: Past Medical History  Diagnosis Date  . Hypertension   . Hyperlipidemia   . Iliac artery aneurysm, right   . CAD (coronary artery disease)   . Type II or unspecified type diabetes mellitus without mention of complication, not stated as uncontrolled   . GERD (gastroesophageal reflux disease)   . Aneurysm artery, iliac   . Stroke     01-05-2012   02-2012  . Kidney stone   . Hepatitis     ? age 15 or 27  . Carotid artery occlusion     Procedure(s): ENDARTERECTOMY CAROTID PATCH ANGIOPLASTY  Discharged Condition: good  HPI: Right carotid stenosis 95% with history of stroke times two. He did receive TPA and had complete resolution back in October 2013. He was admitted back to the hospital and MRI shows a new right posterior frontal subcortical white matter stroke. I was asked to see him for further discussion. Neurology is being consulted today. He denise any symptoms of stroke.  Awaiting cardiology and neurology consults/work up for possible right carotid endarterectomy by Dr. Hart Rochester.  He was discharged 02/04/2012 and returned 02/08/2012 for his Right carotid endarterectomy with Dacron patch angioplasty.     Hospital Course:  Samuel Berger is a 66 y.o. male is S/P Right Procedure(s): ENDARTERECTOMY CAROTID PATCH ANGIOPLASTY Extubated: POD # 0 Post-op wounds clean, dry, intact or healing well Pt. Ambulating, voiding and taking PO diet without difficulty. Pt pain controlled with PO pain meds. Labs as below Complications:none  Consults:     Significant Diagnostic Studies: CBC Lab Results  Component Value Date   WBC  9.3 02/09/2012   HGB 11.9* 02/09/2012   HCT 35.0* 02/09/2012   MCV 89.3 02/09/2012   PLT 129* 02/09/2012    BMET    Component Value Date/Time   NA 136 02/09/2012 0330   K 4.0 02/09/2012 0330   CL 103 02/09/2012 0330   CO2 27 02/09/2012 0330   GLUCOSE 102* 02/09/2012 0330   BUN 13 02/09/2012 0330   CREATININE 0.74 02/09/2012 0330   CALCIUM 8.0* 02/09/2012 0330   GFRNONAA >90 02/09/2012 0330   GFRAA >90 02/09/2012 0330   COAG Lab Results  Component Value Date   INR 0.92 02/07/2012   INR 0.95 01/05/2012     Disposition:  Discharge to :Home Discharge Orders    Future Appointments: Provider: Department: Dept Phone: Center:   08/26/2012 2:00 PM [facility] [phone] [facility]   08/26/2012 3:00 PM Pryor Ochoa, MD [facility] [phone] [facility]     Future Orders Please Complete By Expires   Resume previous diet      Driving Restrictions      Comments:   No driving for 2 weeks   Lifting restrictions      Comments:   No lifting for 6 weeks   Call MD for:  temperature >100.5      Call MD for:  redness, tenderness, or signs of infection (pain, swelling, bleeding, redness, odor or green/yellow discharge around incision site)      Call MD for:  severe or increased pain, loss or decreased feeling  in affected limb(s)  Increase activity slowly      Comments:   Walk with assistance use walker or cane as needed   May shower          [place], [place]  Home Medication Instructions [id number]   Printed on:03/07/12 1452  Medication Information                    amLODipine (NORVASC) 10 MG tablet Take 10 mg by mouth daily.           pioglitazone (ACTOS) 30 MG tablet Take 30 mg by mouth daily.           lisinopril (PRINIVIL,ZESTRIL) 5 MG tablet Take 5 mg by mouth daily.           atorvastatin (LIPITOR) 40 MG tablet Take 40 mg by mouth daily at 6 PM.           clopidogrel (PLAVIX) 75 MG tablet Take 75 mg by  mouth daily with breakfast.           aspirin 81 MG EC tablet Take 1 tablet (81 mg total) by mouth daily. Swallow whole.           oxyCODONE-acetaminophen (ROXICET) 5-325 MG per tablet Take 1 tablet by mouth every 4 (four) hours as needed for pain.            Verbal and written Discharge instructions given to the patient. Wound care per Discharge AVS Follow-up Information    Follow up with Josephina Gip, MD. In 2 weeks. (sent to office)    Contact information:   [address] [place] [address] [phone]          Signed: [name] Gallant [facility] 03/07/2012, 2:52 PM

## 2012-03-20 DIAGNOSIS — H2510 Age-related nuclear cataract, unspecified eye: Secondary | ICD-10-CM | POA: Diagnosis not present

## 2012-04-04 ENCOUNTER — Encounter (INDEPENDENT_AMBULATORY_CARE_PROVIDER_SITE_OTHER): Payer: Medicare Other | Admitting: Ophthalmology

## 2012-04-04 DIAGNOSIS — H5340 Unspecified visual field defects: Secondary | ICD-10-CM

## 2012-04-04 DIAGNOSIS — I10 Essential (primary) hypertension: Secondary | ICD-10-CM

## 2012-04-04 DIAGNOSIS — H3530 Unspecified macular degeneration: Secondary | ICD-10-CM

## 2012-04-04 DIAGNOSIS — H2510 Age-related nuclear cataract, unspecified eye: Secondary | ICD-10-CM

## 2012-04-04 DIAGNOSIS — H35039 Hypertensive retinopathy, unspecified eye: Secondary | ICD-10-CM

## 2012-04-15 DIAGNOSIS — I6350 Cerebral infarction due to unspecified occlusion or stenosis of unspecified cerebral artery: Secondary | ICD-10-CM | POA: Diagnosis not present

## 2012-04-17 ENCOUNTER — Ambulatory Visit (INDEPENDENT_AMBULATORY_CARE_PROVIDER_SITE_OTHER): Payer: Medicare Other | Admitting: Ophthalmology

## 2012-04-17 DIAGNOSIS — I4891 Unspecified atrial fibrillation: Secondary | ICD-10-CM | POA: Diagnosis not present

## 2012-04-17 DIAGNOSIS — H35079 Retinal telangiectasis, unspecified eye: Secondary | ICD-10-CM

## 2012-04-23 ENCOUNTER — Other Ambulatory Visit: Payer: Self-pay

## 2012-04-23 DIAGNOSIS — E782 Mixed hyperlipidemia: Secondary | ICD-10-CM | POA: Diagnosis not present

## 2012-04-23 DIAGNOSIS — E119 Type 2 diabetes mellitus without complications: Secondary | ICD-10-CM | POA: Diagnosis not present

## 2012-04-23 DIAGNOSIS — I6350 Cerebral infarction due to unspecified occlusion or stenosis of unspecified cerebral artery: Secondary | ICD-10-CM | POA: Diagnosis not present

## 2012-04-23 DIAGNOSIS — I10 Essential (primary) hypertension: Secondary | ICD-10-CM | POA: Diagnosis not present

## 2012-05-17 ENCOUNTER — Other Ambulatory Visit: Payer: Self-pay

## 2012-06-18 ENCOUNTER — Telehealth: Payer: Self-pay | Admitting: *Deleted

## 2012-06-18 NOTE — Telephone Encounter (Signed)
06-13-2012 test are ready inCentricity patient requesting results.

## 2012-06-24 NOTE — Telephone Encounter (Signed)
The 3 week outpatient CardioNet monitor is normal. No evidence of arrhythmias noted

## 2012-06-25 NOTE — Telephone Encounter (Signed)
Called patient and let him know his results where normal.

## 2012-07-28 ENCOUNTER — Encounter: Payer: Self-pay | Admitting: Neurology

## 2012-07-28 ENCOUNTER — Ambulatory Visit (INDEPENDENT_AMBULATORY_CARE_PROVIDER_SITE_OTHER): Payer: Medicare Other | Admitting: Neurology

## 2012-07-28 VITALS — BP 126/68 | HR 89 | Temp 98.2°F | Ht 71.5 in | Wt 208.0 lb

## 2012-07-28 DIAGNOSIS — I6990 Unspecified sequelae of unspecified cerebrovascular disease: Secondary | ICD-10-CM | POA: Diagnosis not present

## 2012-07-28 NOTE — Progress Notes (Signed)
[facility] [address] [place]. [place] [address] [phone]       OFFICE FOLLOW-UP NOTE  Mr. KHALFANI WEIDEMAN Date of Birth:  29-Jan-1946 Medical Record Number:  [id number]   HPI: 67 year old patient with embolic left cerebellar infarct in October 2013 treated with IV TPA with excellent recovery as well as a right subcortical infarct specimen 2013. Vascular risk factors of hypertension, hyperlipidemia, diabetes, coronary artery disease, extracranial right carotid stenosis status was right carotid endarterectomy He returns for followup after last visit on 04/15/12. He states is doing well without recurrent stroke or TIA symptoms. His blood pressure is well controlled and it is 126/68 in office today. He plans to have his next lipid profile checked next week with his primary physician. He also has an appointment in 2 weeks with Dr. Hart Rochester and will have carotid ultrasound study in that visit. He states his fasting sugars have all been in good control. He is complaining of increasing bruising and continues to take both aspirin and Plavix. He has no new complaints today. ROS:   14 system review of systems is positive for loss of vision only   PMH:  Past Medical History  Diagnosis Date  . Hypertension   . Hyperlipidemia   . Iliac artery aneurysm, right   . CAD (coronary artery disease)   . Type II or unspecified type diabetes mellitus without mention of complication, not stated as uncontrolled   . GERD (gastroesophageal reflux disease)   . Aneurysm artery, iliac   . Stroke     01-05-2012   02-2012  . Kidney stone   . Hepatitis     ? age 23 or 21  . Carotid artery occlusion     Social History:  History   Social History  . Marital Status: Married    Spouse Name: N/A    Number of Children: 2  . Years of Education: college   Occupational History  . Not on file.   Social History Main Topics  . Smoking status: Former Smoker -- 0.50 packs/day for 30 years   Types: Cigars    Quit date: 01/05/2012  . Smokeless tobacco: Never Used  . Alcohol Use: No     Comment: quit 2 months..Patient drinks coffee daily.  . Drug Use: No  . Sexually Active: Not on file   Other Topics Concern  . Not on file   Social History Narrative  . No narrative on file    Medications:   Current Outpatient Prescriptions on File Prior to Visit  Medication Sig Dispense Refill  . amLODipine (NORVASC) 10 MG tablet Take 10 mg by mouth daily.      Marland Kitchen aspirin 81 MG EC tablet Take 1 tablet (81 mg total) by mouth daily. Swallow whole.  30 tablet  12  . atorvastatin (LIPITOR) 40 MG tablet Take 40 mg by mouth daily at 6 PM.      . clopidogrel (PLAVIX) 75 MG tablet Take 75 mg by mouth daily with breakfast.      . lisinopril (PRINIVIL,ZESTRIL) 5 MG tablet Take 5 mg by mouth daily.      . pioglitazone (ACTOS) 30 MG tablet Take 30 mg by mouth daily.       No current facility-administered medications on file prior to visit.    Allergies:   Allergies  Allergen Reactions  . Sulfa Drugs Cross Reactors Other (See Comments)    Unknown   Filed Vitals:   07/28/12 1508  BP: 126/68  Pulse:  89  Temp: 98.2 F (36.8 C)   Physical Exam General: well developed, well nourished, seated, in no evident distress Head: head normocephalic and atraumatic. Orohparynx benign Neck: supple with no carotid or supraclavicular bruits.Rt CEA scar Cardiovascular: regular rate and rhythm, no murmurs Musculoskeletal: no deformity Skin:  no rash/petichiae Vascular:  Normal pulses all extremities  Neurologic Exam Mental Status: Awake and fully alert. Oriented to place and time. Recent and remote memory intact. Attention span, concentration and fund of knowledge appropriate. Mood and affect appropriate.  Cranial Nerves: Fundoscopic exam reveals sharp disc margins. Pupils equal, briskly reactive to light.Diminished vision acuity left eye  Extraocular movements full without nystagmus. Visual fields full  to confrontation. Hearing intact. Facial sensation intact. Face, tongue, palate moves normally and symmetrically.  Motor: Normal bulk and tone. Normal strength in all tested extremity muscles. Sensory.: intact to tough and pinprick and vibratory.  Coordination: Rapid alternating movements normal in all extremities. Finger-to-nose and heel-to-shin performed accurately bilaterally. Gait and Station: Arises from chair without difficulty. Stance is normal. Gait demonstrates normal stride length and balance . Able to heel, toe and tandem walk without difficulty.  Reflexes: 1+ and symmetric. Toes downgoing.     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 Vascular risk factors of hypertension, hyperlipidemia, diabetes, coronary artery disease, extracranial right carotid stenosis status was right carotid endarterectomy.    PLAN: Continue Plavix for second stroke prevention but discontinue aspirin the tube bruising and I see no clear indication for doing antiplatelet therapy. Strict control of hypertension with blood pressure goal below 120/80, and lipids with LDL cholesterol goal below 70 mg percent and diabetes with HbA1goal below 6.5%. Followup with Dr. Hart Rochester for carotid ultrasound and return for followup with [name] nurse practitioner in 4 months.

## 2012-07-28 NOTE — Patient Instructions (Signed)
He was advised to discontinue aspirin because of bruising and stay on Plavix along a secondary stroke prevention. Continue strict control of hypertension with blood pressure goal below 120/80 and lipids with LDL cholesterol goal below 70 mg percent and  diabetes with hemoglobin A1c goal below 6.5%. He was advised to continue followup with Dr. Hart Rochester for carotid stenosis. Return for followup in 4 months with [facility] nurse practitioner.

## 2012-08-21 ENCOUNTER — Encounter: Payer: Self-pay | Admitting: Vascular Surgery

## 2012-08-21 DIAGNOSIS — N200 Calculus of kidney: Secondary | ICD-10-CM | POA: Diagnosis not present

## 2012-08-21 DIAGNOSIS — E119 Type 2 diabetes mellitus without complications: Secondary | ICD-10-CM | POA: Diagnosis not present

## 2012-08-21 DIAGNOSIS — I6350 Cerebral infarction due to unspecified occlusion or stenosis of unspecified cerebral artery: Secondary | ICD-10-CM | POA: Diagnosis not present

## 2012-08-21 DIAGNOSIS — E782 Mixed hyperlipidemia: Secondary | ICD-10-CM | POA: Diagnosis not present

## 2012-08-21 DIAGNOSIS — I10 Essential (primary) hypertension: Secondary | ICD-10-CM | POA: Diagnosis not present

## 2012-08-21 DIAGNOSIS — Z1331 Encounter for screening for depression: Secondary | ICD-10-CM | POA: Diagnosis not present

## 2012-08-21 DIAGNOSIS — I6529 Occlusion and stenosis of unspecified carotid artery: Secondary | ICD-10-CM | POA: Diagnosis not present

## 2012-08-21 DIAGNOSIS — Z0000 Encounter for general adult medical examination without abnormal findings: Secondary | ICD-10-CM | POA: Diagnosis not present

## 2012-08-22 ENCOUNTER — Other Ambulatory Visit (INDEPENDENT_AMBULATORY_CARE_PROVIDER_SITE_OTHER): Payer: Medicare Other | Admitting: *Deleted

## 2012-08-22 ENCOUNTER — Other Ambulatory Visit: Payer: Self-pay | Admitting: *Deleted

## 2012-08-22 ENCOUNTER — Ambulatory Visit (INDEPENDENT_AMBULATORY_CARE_PROVIDER_SITE_OTHER): Payer: Medicare Other | Admitting: Vascular Surgery

## 2012-08-22 ENCOUNTER — Encounter: Payer: Self-pay | Admitting: Vascular Surgery

## 2012-08-22 DIAGNOSIS — I723 Aneurysm of iliac artery: Secondary | ICD-10-CM | POA: Diagnosis not present

## 2012-08-22 DIAGNOSIS — Z48812 Encounter for surgical aftercare following surgery on the circulatory system: Secondary | ICD-10-CM

## 2012-08-22 DIAGNOSIS — I739 Peripheral vascular disease, unspecified: Secondary | ICD-10-CM

## 2012-08-22 DIAGNOSIS — I6529 Occlusion and stenosis of unspecified carotid artery: Secondary | ICD-10-CM

## 2012-08-22 NOTE — Progress Notes (Signed)
Subjective:     Patient ID: Samuel Berger, male   DOB: 08-Feb-1946, 67 y.o.   MRN: [id number]  HPI this 67 year old male returns for continued followup regarding his cerebrovascular disease and right iliac artery aneurysm and left common iliac artery occlusion. He had right carotid endarterectomy performed 6 months ago. He had a left brain stroke and was found to have mild left ICA stenosis and severe right ICA stenosis. He has recovered well from his left brain stroke and is regaining his strength and beginning to play golf again. His claudication symptoms are not limiting him at the present time although he does have some left buttock weakness with ambulation. He denies any new neurologic symptoms such as lateralizing weakness, a fascia, emergency checks, diplopia, blurred vision, or syncope.  Past Medical History  Diagnosis Date  . Hypertension   . Hyperlipidemia   . Iliac artery aneurysm, right   . CAD (coronary artery disease)   . Type II or unspecified type diabetes mellitus without mention of complication, not stated as uncontrolled   . GERD (gastroesophageal reflux disease)   . Aneurysm artery, iliac   . Stroke     01-05-2012   02-2012  . Kidney stone   . Hepatitis     ? age 38 or 48  . Carotid artery occlusion     History  Substance Use Topics  . Smoking status: Former Smoker -- 0.50 packs/day for 30 years    Types: Cigars    Quit date: 01/05/2012  . Smokeless tobacco: Never Used  . Alcohol Use: No     Comment: quit 2 months..Patient drinks coffee daily.    Family History  Problem Relation Age of Onset  . Diabetes Mother   . Lung cancer Father     Allergies  Allergen Reactions  . Sulfa Drugs Cross Reactors Other (See Comments)    Unknown    Current outpatient prescriptions:amLODipine (NORVASC) 10 MG tablet, Take 10 mg by mouth daily., Disp: , Rfl: ;  atorvastatin (LIPITOR) 40 MG tablet, Take 40 mg by mouth daily at 6 PM., Disp: , Rfl: ;  clopidogrel (PLAVIX) 75  MG tablet, Take 75 mg by mouth daily with breakfast., Disp: , Rfl: ;  lisinopril (PRINIVIL,ZESTRIL) 5 MG tablet, Take 5 mg by mouth daily., Disp: , Rfl:  pioglitazone (ACTOS) 30 MG tablet, Take 30 mg by mouth daily., Disp: , Rfl: ;  aspirin 81 MG EC tablet, Take 1 tablet (81 mg total) by mouth daily. Swallow whole., Disp: 30 tablet, Rfl: 12  BP 146/83  Pulse 86  Ht 5' 11.5" (1.816 m)  Wt 204 lb (92.534 kg)  BMI 28.06 kg/m2  SpO2 100%  Body mass index is 28.06 kg/(m^2).          Review of Systems denies chest pain, dyspnea on exertion, PND, orthopnea, hemoptysis.     Objective:   Physical Exam blood pressure 146/83 heart rate 86 respirations 16 Gen.-alert and oriented x3 in no apparent distress HEENT normal for age Lungs no rhonchi or wheezing Cardiovascular regular rhythm no murmurs carotid pulses 3+ palpable no bruits audible Abdomen soft nontender no palpable masses Musculoskeletal free of  major deformities Skin clear -no rashes Neurologic normal Lower extremities 3+ femoral , 2+ popliteal, 2+ posterior tibial pulse palpable on the right Absent left femoral and distal pulses-both feet adequately perfused no evidence of ischemia  Today I ordered a carotid duplex exam which are reviewed and interpreted. There is no evidence of restenosis at the  right carotid endarterectomy site. 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

## 2012-08-26 ENCOUNTER — Other Ambulatory Visit: Payer: Medicare Other

## 2012-08-26 ENCOUNTER — Ambulatory Visit: Payer: Medicare Other | Admitting: Vascular Surgery

## 2012-09-09 DIAGNOSIS — E119 Type 2 diabetes mellitus without complications: Secondary | ICD-10-CM | POA: Diagnosis not present

## 2012-11-05 ENCOUNTER — Other Ambulatory Visit: Payer: Self-pay

## 2012-11-24 ENCOUNTER — Ambulatory Visit (INDEPENDENT_AMBULATORY_CARE_PROVIDER_SITE_OTHER): Payer: Medicare Other | Admitting: Ophthalmology

## 2012-11-24 DIAGNOSIS — I10 Essential (primary) hypertension: Secondary | ICD-10-CM | POA: Diagnosis not present

## 2012-11-24 DIAGNOSIS — H43819 Vitreous degeneration, unspecified eye: Secondary | ICD-10-CM

## 2012-11-24 DIAGNOSIS — H2510 Age-related nuclear cataract, unspecified eye: Secondary | ICD-10-CM

## 2012-11-24 DIAGNOSIS — H47219 Primary optic atrophy, unspecified eye: Secondary | ICD-10-CM | POA: Diagnosis not present

## 2012-11-24 DIAGNOSIS — E1139 Type 2 diabetes mellitus with other diabetic ophthalmic complication: Secondary | ICD-10-CM | POA: Diagnosis not present

## 2012-11-24 DIAGNOSIS — H35039 Hypertensive retinopathy, unspecified eye: Secondary | ICD-10-CM

## 2012-11-24 DIAGNOSIS — E11319 Type 2 diabetes mellitus with unspecified diabetic retinopathy without macular edema: Secondary | ICD-10-CM | POA: Diagnosis not present

## 2012-11-27 ENCOUNTER — Encounter: Payer: Self-pay | Admitting: Neurology

## 2012-11-27 ENCOUNTER — Ambulatory Visit (INDEPENDENT_AMBULATORY_CARE_PROVIDER_SITE_OTHER): Payer: Medicare Other | Admitting: Neurology

## 2012-11-27 VITALS — BP 154/77 | HR 83 | Temp 97.6°F | Ht 71.0 in | Wt 207.0 lb

## 2012-11-27 DIAGNOSIS — I6789 Other cerebrovascular disease: Secondary | ICD-10-CM

## 2012-11-27 NOTE — Patient Instructions (Addendum)
Continue Plavix for stroke prevention and strict control of hypertension with blood pressure goal below 120/80, lipids with LDL cholesterol goal below 70 mg percent and diabetes with hemoglobin A1c go to the 6.5%. Continue follow for carotid stenosis with Dr. Hart Rochester. Return for followup with [name], NP in 6 months.

## 2012-11-28 NOTE — Progress Notes (Signed)
[facility] [address] [place]. [place] [address] [phone]       OFFICE FOLLOW-UP NOTE  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 Vascular risk factors of hypertension, hyperlipidemia, diabetes, coronary artery disease, extracranial right carotid stenosis status was right carotid endarterectomy He returns for followup after last visit on 04/15/12. He states is doing well without recurrent stroke or TIA symptoms. His blood pressure is well controlled and it is 126/68 in office today. He plans to have his next lipid profile checked next week with his primary physician. He also has an appointment in 2 weeks with Dr. Hart Rochester and will have carotid ultrasound study in that visit. He states his fasting sugars have all been in good control. He is complaining of increasing bruising and continues to take both aspirin and Plavix. He has no new complaints today. UPDATE 11/27/12 He returns for f/u today with no recurrent neurovascular symptoms or new health issues. He is tolerating plavix well withot bleeding or bruising.Fastings sugars seem ok. Last HbA1c was 6 as per patient. His blood pressure was in 140s and is  154/77 today.He last saw Dr Hart Rochester in June 2014 and had f/u carotid dopplers which were fine. ROS:   14 system review of systems is positive for no complaints today PMH:  Past Medical History  Diagnosis Date  . Hypertension   . Hyperlipidemia   . Iliac artery aneurysm, right   . CAD (coronary artery disease)   . Type II or unspecified type diabetes mellitus without mention of complication, not stated as uncontrolled   . GERD (gastroesophageal reflux disease)   . Aneurysm artery, iliac   . Stroke     01-05-2012   02-2012  . Kidney stone   . Hepatitis     ? age 66 or 38  . Carotid artery occlusion     Social History:  History   Social History  . Marital Status: Married    Spouse Name: N/A    Number of Children: 2  . Years of Education: college   Occupational History  . Not on file.   Social History Main Topics  . Smoking status: Former Smoker -- 0.50 packs/day for 30 years    Types: Cigars    Quit date: 01/05/2012  . Smokeless tobacco: Never Used  . Alcohol Use: No     Comment: quit 2 months..Patient drinks coffee daily.  . Drug Use: No  . Sexual Activity: Not on file   Other Topics Concern  . Not on file   Social History Narrative  . No narrative on file    Medications:   Current Outpatient Prescriptions on File Prior to Visit  Medication Sig Dispense Refill  . amLODipine (NORVASC) 10 MG tablet Take 10 mg by mouth daily.      Marland Kitchen atorvastatin (LIPITOR) 40 MG tablet Take 40 mg by mouth daily at 6 PM.      . clopidogrel (PLAVIX) 75 MG tablet Take 75 mg by mouth daily with breakfast.      . lisinopril (PRINIVIL,ZESTRIL) 5 MG tablet Take 5 mg by mouth daily.      . pioglitazone (ACTOS) 30 MG tablet Take 30 mg by mouth daily.       No current facility-administered medications on file prior to visit.  Allergies:   Allergies  Allergen Reactions  . Sulfa Drugs Cross Reactors Other (See Comments)    Unknown   Filed Vitals:   11/27/12 1450  BP: 154/77  Pulse: 83  Temp: 97.6 F (36.4 C)   Physical Exam General: well developed, well nourished, seated, in no evident distress Head: head normocephalic and atraumatic. Orohparynx benign Neck: supple with no carotid or supraclavicular bruits.Rt CEA scar Cardiovascular: regular rate and rhythm, no murmurs Musculoskeletal: no deformity Skin:  no rash/petichiae Vascular:  Normal pulses all extremities  Neurologic Exam Mental Status: Awake and fully alert. Oriented to place and time. Recent and remote memory intact. Attention span, concentration and fund of knowledge  appropriate. Mood and affect appropriate.  Cranial Nerves: Fundoscopic exam not done  Pupils equal, briskly reactive to light.Diminished vision acuity left eye  Extraocular movements full without nystagmus. Visual fields full to confrontation. Hearing intact. Facial sensation intact. Face, tongue, palate moves normally and symmetrically.  Motor: Normal bulk and tone. Normal strength in all tested extremity muscles. Sensory.: intact to touch and pinprick and vibratory sensation.  Coordination: Rapid alternating movements normal in all extremities. Finger-to-nose and heel-to-shin performed accurately bilaterally. Gait and Station: Arises from chair without difficulty. Stance is normal. Gait demonstrates normal stride length and balance . Able to heel, toe and tandem walk without difficulty.  Reflexes: 1+ and symmetric. Toes downgoing.     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 Vascular risk factors of hypertension, hyperlipidemia, diabetes, coronary artery disease, extracranial right carotid stenosis status was right carotid endarterectomy.    PLAN: Continue Plavix for stroke prevention and strict control of hypertension with blood pressure goal below 120/80, lipids with LDL cholesterol goal below 70 mg percent and diabetes with hemoglobin A1c go to the 6.5%. Continue follow for carotid stenosis with Dr. Hart Rochester. Return for followup with [name], NP in 6 months.

## 2013-01-08 DIAGNOSIS — H409 Unspecified glaucoma: Secondary | ICD-10-CM | POA: Diagnosis not present

## 2013-02-05 ENCOUNTER — Other Ambulatory Visit: Payer: Self-pay

## 2013-02-06 DIAGNOSIS — Z23 Encounter for immunization: Secondary | ICD-10-CM | POA: Diagnosis not present

## 2013-02-06 DIAGNOSIS — E782 Mixed hyperlipidemia: Secondary | ICD-10-CM | POA: Diagnosis not present

## 2013-02-06 DIAGNOSIS — E119 Type 2 diabetes mellitus without complications: Secondary | ICD-10-CM | POA: Diagnosis not present

## 2013-02-06 DIAGNOSIS — I6350 Cerebral infarction due to unspecified occlusion or stenosis of unspecified cerebral artery: Secondary | ICD-10-CM | POA: Diagnosis not present

## 2013-02-06 DIAGNOSIS — I739 Peripheral vascular disease, unspecified: Secondary | ICD-10-CM | POA: Diagnosis not present

## 2013-02-06 DIAGNOSIS — I10 Essential (primary) hypertension: Secondary | ICD-10-CM | POA: Diagnosis not present

## 2013-03-02 ENCOUNTER — Other Ambulatory Visit: Payer: Self-pay | Admitting: Vascular Surgery

## 2013-03-02 ENCOUNTER — Encounter: Payer: Self-pay | Admitting: Vascular Surgery

## 2013-03-02 DIAGNOSIS — I723 Aneurysm of iliac artery: Secondary | ICD-10-CM | POA: Diagnosis not present

## 2013-03-02 DIAGNOSIS — I6529 Occlusion and stenosis of unspecified carotid artery: Secondary | ICD-10-CM | POA: Diagnosis not present

## 2013-03-02 LAB — CREATININE, SERUM: Creat: 1.02 mg/dL (ref 0.50–1.35)

## 2013-03-02 LAB — BUN: BUN: 21 mg/dL (ref 6–23)

## 2013-03-03 ENCOUNTER — Other Ambulatory Visit: Payer: Self-pay

## 2013-03-03 ENCOUNTER — Ambulatory Visit (INDEPENDENT_AMBULATORY_CARE_PROVIDER_SITE_OTHER): Payer: Medicare Other | Admitting: Vascular Surgery

## 2013-03-03 ENCOUNTER — Ambulatory Visit (INDEPENDENT_AMBULATORY_CARE_PROVIDER_SITE_OTHER)
Admission: RE | Admit: 2013-03-03 | Discharge: 2013-03-03 | Disposition: A | Discharge status: Home / Self Care | Payer: Medicare Other | Source: Ambulatory Visit | Attending: Vascular Surgery | Admitting: Vascular Surgery

## 2013-03-03 ENCOUNTER — Encounter: Payer: Self-pay | Admitting: Vascular Surgery

## 2013-03-03 ENCOUNTER — Other Ambulatory Visit (HOSPITAL_COMMUNITY): Payer: Medicare Other

## 2013-03-03 ENCOUNTER — Ambulatory Visit
Admission: RE | Admit: 2013-03-03 | Discharge: 2013-03-03 | Disposition: A | Discharge status: Home / Self Care | Payer: Medicare Other | Source: Ambulatory Visit | Attending: Vascular Surgery | Admitting: Vascular Surgery

## 2013-03-03 ENCOUNTER — Ambulatory Visit (HOSPITAL_COMMUNITY)
Admission: RE | Admit: 2013-03-03 | Discharge: 2013-03-03 | Disposition: A | Discharge status: Home / Self Care | Payer: Medicare Other | Source: Ambulatory Visit | Attending: Vascular Surgery | Admitting: Vascular Surgery

## 2013-03-03 ENCOUNTER — Encounter (HOSPITAL_COMMUNITY): Payer: Medicare Other

## 2013-03-03 DIAGNOSIS — Z48812 Encounter for surgical aftercare following surgery on the circulatory system: Secondary | ICD-10-CM

## 2013-03-03 DIAGNOSIS — I6529 Occlusion and stenosis of unspecified carotid artery: Secondary | ICD-10-CM | POA: Insufficient documentation

## 2013-03-03 DIAGNOSIS — I739 Peripheral vascular disease, unspecified: Secondary | ICD-10-CM | POA: Diagnosis not present

## 2013-03-03 DIAGNOSIS — I723 Aneurysm of iliac artery: Secondary | ICD-10-CM

## 2013-03-03 MED ORDER — IOHEXOL 350 MG/ML SOLN
100.0000 mL | Freq: Once | INTRAVENOUS | Status: AC | PRN
  Administered 2013-03-03: 100 mL via INTRAVENOUS

## 2013-03-03 NOTE — Progress Notes (Signed)
Subjective:     Patient ID: Samuel Berger, male   DOB: [birth year], 67 y.o.   MRN: [id number]  HPI VASCULAR QUALITY INITIATIVE FOLLOW UP DATA:  Current smoker: [  ] yes  [x  ] no  Living status: [ x ]  Home  [  ] Nursing home  [  ] Homeless    MEDS:  ASA [  ] yes  [ x ] no- [  ] medical reason  [  ] non compliant  STATIN  [ x ] yes  [  ] no- [  ] medical reason  [  ] non compliant  Beta blocker [x  ] yes  [  ] no- [  ] medical reason  [  ] non compliant  ACE inhibitor [x ] yes  [  ] no- [  ] medical reason  [  ] non compliant  P2Y12 Antagonist [  ] none  [x  ] clopidogrel-Plavix  [  ] ticlopidine-Ticlid   [  ] prasugrel-Effient  [  ] ticagrelor- Brilinta    Anticoagulant [x  ] None  [  ] warfarin  [  ] rivaroxaban-Xarelto [  ] dabigatran- Pradaxa  Neurologic event since D/C:  [ x ] no  [  ] yes: [  ] eye event  [  ] cortical event  [  ] VB event  [  ] non specific event  [  ] right  [  ] left  [  ] TIA  [  ] stroke  Date:   Modified Rankin Score: 0  MI since D/C: [ x ] no  [  ] troponin only  [  ] EKG or clinical  Cranial nerve injury: [ x ] none  [  ] resolved  [  ] persistent  Duplex CEA site: [  ] no  [x  ] yes - PSV= 79  EDV= 12  ICA/CCA ratio: 1.1  Stenosis= [ x ] <40% [  ] 40-59% [  ] 60-79%  [  ] > 80%  [  ]  Occluded  CEA site re-operation:  [x  ] no   [  ] yes- date of re-op:  CEA site PCI:   [x  ] no   [  ] yes- date of PCI:   This 67 year old male returns for continued followup regarding his right carotid and arterectomy performed in November of 2013. He had a small left brain stroke preoperatively which resulted in no permanent neurologic deficits. He has had no neurologic symptoms since that time. He denies any aphasia, amaurosis fugax, diplopia, blurred vision, or syncope.  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 He is able to  walk several blocks and plays golf 3-4 days per week without difficulty. Has no history of rest pain or nonhealing ulcers.  Past Medical History  Diagnosis Date  . Hypertension   . Hyperlipidemia   . Iliac artery aneurysm, right   . CAD (coronary artery disease)   . Type II or unspecified type diabetes mellitus without mention of complication, not stated as uncontrolled   . GERD (gastroesophageal reflux disease)   . Aneurysm artery, iliac   . Stroke     01-05-2012   02-2012  . Kidney stone   . Hepatitis     ? age 79 or 43  . Carotid artery  occlusion     History  Substance Use Topics  . Smoking status: Former Smoker -- 0.50 packs/day for 30 years    Types: Cigars    Quit date: 01/05/2012  . Smokeless tobacco: Never Used  . Alcohol Use: No     Comment: quit 2 months..Patient drinks coffee daily.    Family History  Problem Relation Age of Onset  . Diabetes Mother   . Lung cancer Father     Allergies  Allergen Reactions  . Sulfa Drugs Cross Reactors Other (See Comments)    Unknown    Current outpatient prescriptions:amLODipine (NORVASC) 10 MG tablet, Take 10 mg by mouth daily., Disp: , Rfl: ;  atorvastatin (LIPITOR) 40 MG tablet, Take 40 mg by mouth daily at 6 PM., Disp: , Rfl: ;  clopidogrel (PLAVIX) 75 MG tablet, Take 75 mg by mouth daily with breakfast., Disp: , Rfl: ;  lisinopril (PRINIVIL,ZESTRIL) 5 MG tablet, Take 5 mg by mouth daily., Disp: , Rfl:  pioglitazone (ACTOS) 30 MG tablet, Take 30 mg by mouth daily., Disp: , Rfl:   BP 148/72  Pulse 89  Resp 16  Ht 6' (1.829 m)  Wt 214 lb (97.07 kg)  BMI 29.02 kg/m2  Body mass index is 29.02 kg/(m^2).           Review of Systems denies chest pain, dyspnea on exertion, PND, orthopnea, hemoptysis. Has mild left hip claudication symptoms at about 3-4 blocks which are not limiting him. Other systems negative and complete review of systems     Objective:   Physical Exam BP 148/72  Pulse 89  Resp 16  Ht 6'  (1.829 m)  Wt 214 lb (97.07 kg)  BMI 29.02 kg/m2  Gen.-alert and oriented x3 in no apparent distress HEENT normal for age Lungs no rhonchi or wheezing Cardiovascular regular rhythm no murmurs carotid pulses 3+ palpable no bruits audible Abdomen soft nontender no palpable masses Musculoskeletal free of  major deformities Skin clear -no rashes Neurologic normal Lower extremities 3+ femoral and 2+ popliteal pulse on the right. Left leg with absent femoral and distal pulses. Both feet adequately perfused.  Today I ordered a carotid duplex exam which I reviewed and interpreted. Right carotid endarterectomy site is widely patent. Left internal carotid has an approximate 40% stenosis which is unchanged from previous studies.  Also ordered lower extremity arterial Dopplers which revealed an ABI of 0.68 on the right with triphasic flow and 0.54 on the left with monophasic flow  Also ordered a CT angiogram today which are reviewed by the computer. 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 Left common iliac artery remains occluded. No significant aneurysmal changes in the abdominal aorta.        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

## 2013-03-03 NOTE — Addendum Note (Signed)
Addended by: Adria Dill L on: 03/03/2013 12:28 PM   Modules accepted: Orders

## 2013-03-18 DIAGNOSIS — M7020 Olecranon bursitis, unspecified elbow: Secondary | ICD-10-CM | POA: Diagnosis not present

## 2013-06-01 ENCOUNTER — Ambulatory Visit: Payer: Medicare Other | Admitting: Nurse Practitioner

## 2013-06-12 ENCOUNTER — Ambulatory Visit (INDEPENDENT_AMBULATORY_CARE_PROVIDER_SITE_OTHER): Payer: Medicare Other | Admitting: Nurse Practitioner

## 2013-06-12 ENCOUNTER — Encounter: Payer: Self-pay | Admitting: Nurse Practitioner

## 2013-06-12 VITALS — BP 125/67 | HR 91 | Ht 71.0 in | Wt <= 1120 oz

## 2013-06-12 DIAGNOSIS — I6789 Other cerebrovascular disease: Secondary | ICD-10-CM | POA: Insufficient documentation

## 2013-06-12 NOTE — Patient Instructions (Signed)
PLAN:  Continue Plavix for stroke prevention and strict control of hypertension with blood pressure goal below 130/80, lipids with LDL cholesterol goal below 70 mg percent and diabetes with hemoglobin A1c go to the 6.5%. Continue follow for carotid stenosis with Dr. Kellie Simmering. Return for followup in 6 months.

## 2013-06-12 NOTE — Progress Notes (Signed)
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 Vascular risk factors of hypertension, hyperlipidemia, diabetes, coronary artery disease, extracranial right carotid stenosis status was right carotid endarterectomy  He returns for followup after last visit on 04/15/12. He states is doing well without recurrent stroke or TIA symptoms. His blood pressure is well controlled and it is 126/68 in office today. He plans to have his next lipid profile checked next week with his primary physician. He also has an appointment in 2 weeks with Dr. Kellie Simmering and will have carotid ultrasound study in that visit. He states his fasting sugars have all been in good control. He is complaining of increasing bruising and continues to take both aspirin and Plavix. He has no new complaints today.   UPDATE 11/27/12 (PS): He returns for f/u today with no recurrent neurovascular symptoms or new health issues. He is tolerating plavix well withot bleeding or bruising.Fastings sugars seem ok. Last HbA1c was 6 as per patient. His blood pressure was in 140s and is 154/77 today.  He last saw Dr Kellie Simmering in June 2014 and had f/u carotid dopplers which were fine.   UPDATE 06/12/13 (LL): He returns for f/u today with no recurrent neurovascular symptoms or new health issues. He is tolerating plavix well withou bleeding or bruising. Last Carotid Doppler with Dr. Kellie Simmering was 03/02/13 which was patent on the right and <40 % on the left.  His BP is well controlled, it is 125/67 today. He has physical scheduled with his primary in May.  ROS:  14 system review of systems is positive for no complaints today  ALLERGIES: Allergies  Allergen Reactions  . Sulfa Drugs Cross Reactors Other (See  Comments)    Unknown    HOME MEDICATIONS: Outpatient Prescriptions Prior to Visit  Medication Sig Dispense Refill  . amLODipine (NORVASC) 10 MG tablet Take 10 mg by mouth daily.      Marland Kitchen atorvastatin (LIPITOR) 40 MG tablet Take 40 mg by mouth daily at 6 PM.      . clopidogrel (PLAVIX) 75 MG tablet Take 75 mg by mouth daily with breakfast.      . lisinopril (PRINIVIL,ZESTRIL) 5 MG tablet Take 5 mg by mouth daily.      . pioglitazone (ACTOS) 30 MG tablet Take 30 mg by mouth daily.       No facility-administered medications prior to visit.   PHYSICAL EXAM  Filed Vitals:   06/12/13 1521  BP: 125/67  Pulse: 91  Height: 5\' 11"  (1.803 m)  Weight: 22 lb (9.979 kg)   Body mass index is 3.07 kg/(m^2).  Generalized: Well developed, in no acute distress  Head: normocephalic and atraumatic. Oropharynx benign  Neck: Supple, no carotid bruits  Cardiac: Regular rate rhythm, no murmur  Musculoskeletal: No deformity   Neurological examination  Mentation: Alert oriented to time, place, history taking. Follows all commands speech and language fluent Cranial nerve II-XII: Fundoscopic exam not done.  Pupils were equal round reactive to light extraocular movements were full, visual field were full on confrontational test. Facial sensation and strength were normal. hearing was intact to finger rubbing bilaterally. Uvula tongue midline. head turning and shoulder shrug and were normal and symmetric.Tongue protrusion into cheek strength was normal. Motor: The motor testing reveals  5 over 5 strength of all 4 extremities. Good symmetric motor tone is noted throughout.  Sensory: Sensory testing is intact to pinprick, soft touch, vibration sensation, and position sense on all 4 extremities. No evidence of extinction is noted.  Coordination: Cerebellar testing reveals good finger-nose-finger and heel-to-shin bilaterally.  Gait and station: Gait is normal. Tandem gait is normal. Romberg is negative. No drift is  seen.  Reflexes: Deep tendon reflexes are symmetric and normal bilaterally. Toes are downgoing bilaterally.   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 Vascular risk factors of hypertension, hyperlipidemia, diabetes, coronary artery disease, extracranial right carotid stenosis status post right carotid endarterectomy.  Doing very well.  PLAN:  Continue Plavix for stroke prevention and strict control of hypertension with blood pressure goal below 120/80, lipids with LDL cholesterol goal below 70 mg percent and diabetes with hemoglobin A1c go to the 6.5%. Continue follow for carotid stenosis with Dr. Kellie Simmering. Return for followup with Jeani Hawking, NP in 6 months.  Philmore Pali, MSN, NP-C 06/12/2013, 3:26 PM [facility] [address], [place], [place] [address] [phone]  Note: This document was prepared with digital dictation and possible smart phrase technology. Any transcriptional errors that result from this process are unintentional.

## 2013-07-09 DIAGNOSIS — H2589 Other age-related cataract: Secondary | ICD-10-CM | POA: Diagnosis not present

## 2013-07-09 DIAGNOSIS — H353190 Nonexudative age-related macular degeneration, unspecified eye, stage unspecified: Secondary | ICD-10-CM | POA: Diagnosis not present

## 2013-08-25 DIAGNOSIS — Z1331 Encounter for screening for depression: Secondary | ICD-10-CM | POA: Diagnosis not present

## 2013-08-25 DIAGNOSIS — I10 Essential (primary) hypertension: Secondary | ICD-10-CM | POA: Diagnosis not present

## 2013-08-25 DIAGNOSIS — Z0000 Encounter for general adult medical examination without abnormal findings: Secondary | ICD-10-CM | POA: Diagnosis not present

## 2013-08-25 DIAGNOSIS — I739 Peripheral vascular disease, unspecified: Secondary | ICD-10-CM | POA: Diagnosis not present

## 2013-08-25 DIAGNOSIS — E119 Type 2 diabetes mellitus without complications: Secondary | ICD-10-CM | POA: Diagnosis not present

## 2013-08-25 DIAGNOSIS — E782 Mixed hyperlipidemia: Secondary | ICD-10-CM | POA: Diagnosis not present

## 2013-10-26 DIAGNOSIS — Z1211 Encounter for screening for malignant neoplasm of colon: Secondary | ICD-10-CM | POA: Diagnosis not present

## 2013-12-14 ENCOUNTER — Ambulatory Visit (INDEPENDENT_AMBULATORY_CARE_PROVIDER_SITE_OTHER): Payer: Medicare Other | Admitting: Nurse Practitioner

## 2013-12-14 ENCOUNTER — Encounter: Payer: Self-pay | Admitting: Nurse Practitioner

## 2013-12-14 VITALS — BP 118/70 | HR 92 | Ht 70.0 in | Wt 216.0 lb

## 2013-12-14 DIAGNOSIS — I6789 Other cerebrovascular disease: Secondary | ICD-10-CM

## 2013-12-14 NOTE — Patient Instructions (Signed)
Continue Plavix for stroke prevention and strict control of hypertension with blood pressure goal below 120/80, lipids with LDL cholesterol goal below 70 mg percent and diabetes with hemoglobin A1c go to the 6.5%. Continue follow up for carotid stenosis with Dr. Kellie Simmering. Return for followup with Dr. Leonie Man in 1 year, sooner as needed.  Stroke Prevention Some medical conditions and behaviors are associated with an increased chance of having a stroke. You may prevent a stroke by making healthy choices and managing medical conditions. HOW CAN I REDUCE MY RISK OF HAVING A STROKE?   Stay physically active. Get at least 30 minutes of activity on most or all days.  Do not smoke. It may also be helpful to avoid exposure to secondhand smoke.  Limit alcohol use. Moderate alcohol use is considered to be:  No more than 2 drinks per day for men.  No more than 1 drink per day for nonpregnant women.  Eat healthy foods. This involves:  Eating 5 or more servings of fruits and vegetables a day.  Making dietary changes that address high blood pressure (hypertension), high cholesterol, diabetes, or obesity.  Manage your cholesterol levels.  Making food choices that are high in fiber and low in saturated fat, trans fat, and cholesterol may control cholesterol levels.  Take any prescribed medicines to control cholesterol as directed by your health care provider.  Manage your diabetes.  Controlling your carbohydrate and sugar intake is recommended to manage diabetes.  Take any prescribed medicines to control diabetes as directed by your health care provider.  Control your hypertension.  Making food choices that are low in salt (sodium), saturated fat, trans fat, and cholesterol is recommended to manage hypertension.  Take any prescribed medicines to control hypertension as directed by your health care provider.  Maintain a healthy weight.  Reducing calorie intake and making food choices that are low  in sodium, saturated fat, trans fat, and cholesterol are recommended to manage weight.  Stop drug abuse.  Avoid taking birth control pills.  Talk to your health care provider about the risks of taking birth control pills if you are over 18 years old, smoke, get migraines, or have ever had a blood clot.  Get evaluated for sleep disorders (sleep apnea).  Talk to your health care provider about getting a sleep evaluation if you snore a lot or have excessive sleepiness.  Take medicines only as directed by your health care provider.  For some people, aspirin or blood thinners (anticoagulants) are helpful in reducing the risk of forming abnormal blood clots that can lead to stroke. If you have the irregular heart rhythm of atrial fibrillation, you should be on a blood thinner unless there is a good reason you cannot take them.  Understand all your medicine instructions.  Make sure that other conditions (such as anemia or atherosclerosis) are addressed. SEEK IMMEDIATE MEDICAL CARE IF:   You have sudden weakness or numbness of the face, arm, or leg, especially on one side of the body.  Your face or eyelid droops to one side.  You have sudden confusion.  You have trouble speaking (aphasia) or understanding.  You have sudden trouble seeing in one or both eyes.  You have sudden trouble walking.  You have dizziness.  You have a loss of balance or coordination.  You have a sudden, severe headache with no known cause.  You have new chest pain or an irregular heartbeat. Any of these symptoms may represent a serious problem that is an  emergency. Do not wait to see if the symptoms will go away. Get medical help at once. Call your local emergency services (911 in [place] Do not drive yourself to the hospital. Document Released: 04/26/2004 Document Revised: 08/03/2013 Document Reviewed: 09/19/2012 [facility] Patient Information 2015 [place], [place]. This information is not intended to replace  advice given to you by your health care provider. Make sure you discuss any questions you have with your health care provider.

## 2013-12-14 NOTE — Progress Notes (Signed)
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 Vascular risk factors of hypertension, hyperlipidemia, diabetes, coronary artery disease, extracranial right carotid stenosis status was right carotid endarterectomy.   He returns for followup after last visit on 06/12/13 with me. He states is doing well without recurrent stroke or TIA symptoms.  He just had tooth extraction earlier today for a tooth that had cracked and it is starting to hurt. His blood pressure is well controlled and it is 118/70 in office today. He had his lipid profile checked in March with his primary physician and states that it was good. He has an appointment in December with Dr. Kellie Simmering and will have carotid ultrasound study in that visit. He states his fasting sugars have all been in good control. He is tolerating Plavix well with no signs of significant bleeding or bruising. He has no new complaints today. He plans a 3 week vacation over Christmas in [place] to golf and relax.  ROS:  14 system review of systems is positive for mouth pain, just had tooth extraction today.  ALLERGIES: Allergies  Allergen Reactions  . Sulfa Drugs Cross Reactors Other (See Comments)    Unknown    HOME MEDICATIONS: Outpatient Prescriptions Prior to Visit  Medication Sig Dispense Refill  . amLODipine (NORVASC) 10 MG tablet Take 10 mg by mouth daily.      Marland Kitchen atorvastatin (LIPITOR) 40 MG tablet Take 40 mg by mouth daily at 6 PM.      . clopidogrel (PLAVIX) 75 MG tablet Take 75 mg by mouth daily with breakfast.      . lisinopril (PRINIVIL,ZESTRIL) 5 MG tablet Take 5 mg by mouth daily.      . pioglitazone (ACTOS) 30 MG tablet Take 30 mg by mouth daily.       No  facility-administered medications prior to visit.    PHYSICAL EXAM Filed Vitals:   12/14/13 1501  BP: 118/70  Pulse: 92  Height: 5\' 10"  (1.778 m)  Weight: 216 lb (97.977 kg)   Body mass index is 30.99 kg/(m^2).  Generalized: Well developed, in no acute distress  Head: normocephalic and atraumatic. Oropharynx benign  Neck: Supple, no carotid bruits  Cardiac: Regular rate rhythm, no murmur  Musculoskeletal: No deformity   Neurological examination  Mentation: Alert oriented to time, place, history taking. Follows all commands speech and language fluent Cranial nerve II-XII: Fundoscopic exam not done. Pupils were equal round reactive to light extraocular movements were full, visual field were full on confrontational test. Facial sensation and strength were normal. hearing was intact to finger rubbing bilaterally. Uvula tongue midline. head turning and shoulder shrug and were normal and symmetric.Tongue protrusion into cheek strength was normal. Motor: The motor testing reveals 5 over 5 strength of all 4 extremities. Good symmetric motor tone is noted throughout.  Sensory: Sensory testing is intact to soft touch on all 4 extremities. No evidence of extinction is noted.  Coordination: Cerebellar testing reveals good finger-nose-finger and heel-to-shin bilaterally.  Gait and station: Gait is normal. Tandem gait is normal. Romberg is negative. Reflexes: Deep tendon reflexes are symmetric and normal bilaterally.   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 Vascular risk factors of hypertension, hyperlipidemia, diabetes, coronary artery disease, extracranial right carotid stenosis status post right carotid endarterectomy. Doing very well.   PLAN:  Continue Plavix for stroke prevention and strict control of hypertension with blood pressure goal below 120/80,  lipids with LDL cholesterol goal below 70 mg percent and diabetes with hemoglobin A1c go to the 6.5%. Continue follow up for carotid stenosis with Dr. Kellie Simmering. Return for followup with Dr. Leonie Man in 1 year, sooner as needed.  Rudi Rummage Juletta Berhe, MSN, FNP-BC, A/GNP-C 12/14/2013, 3:11 PM [facility] [address] [place], [place] [address] [phone]  Note: This document was prepared with digital dictation and possible smart phrase technology. Any transcriptional errors that result from this process are unintentional.

## 2013-12-18 NOTE — Progress Notes (Signed)
I agree with the above plan 

## 2014-01-04 ENCOUNTER — Ambulatory Visit (INDEPENDENT_AMBULATORY_CARE_PROVIDER_SITE_OTHER): Payer: Medicare Other | Admitting: Ophthalmology

## 2014-01-04 DIAGNOSIS — H43813 Vitreous degeneration, bilateral: Secondary | ICD-10-CM

## 2014-01-04 DIAGNOSIS — E11311 Type 2 diabetes mellitus with unspecified diabetic retinopathy with macular edema: Secondary | ICD-10-CM

## 2014-01-04 DIAGNOSIS — I10 Essential (primary) hypertension: Secondary | ICD-10-CM

## 2014-01-04 DIAGNOSIS — H3531 Nonexudative age-related macular degeneration: Secondary | ICD-10-CM

## 2014-01-04 DIAGNOSIS — E11321 Type 2 diabetes mellitus with mild nonproliferative diabetic retinopathy with macular edema: Secondary | ICD-10-CM | POA: Diagnosis not present

## 2014-01-04 DIAGNOSIS — H47212 Primary optic atrophy, left eye: Secondary | ICD-10-CM | POA: Diagnosis not present

## 2014-01-04 DIAGNOSIS — H2513 Age-related nuclear cataract, bilateral: Secondary | ICD-10-CM

## 2014-01-04 DIAGNOSIS — H35033 Hypertensive retinopathy, bilateral: Secondary | ICD-10-CM

## 2014-01-11 ENCOUNTER — Encounter (INDEPENDENT_AMBULATORY_CARE_PROVIDER_SITE_OTHER): Payer: Medicare Other | Admitting: Ophthalmology

## 2014-01-14 ENCOUNTER — Encounter: Payer: Self-pay | Admitting: *Deleted

## 2014-02-16 DIAGNOSIS — E119 Type 2 diabetes mellitus without complications: Secondary | ICD-10-CM | POA: Diagnosis not present

## 2014-02-16 DIAGNOSIS — I739 Peripheral vascular disease, unspecified: Secondary | ICD-10-CM | POA: Diagnosis not present

## 2014-02-16 DIAGNOSIS — E780 Pure hypercholesterolemia: Secondary | ICD-10-CM | POA: Diagnosis not present

## 2014-02-16 DIAGNOSIS — G4730 Sleep apnea, unspecified: Secondary | ICD-10-CM | POA: Diagnosis not present

## 2014-02-16 DIAGNOSIS — Z23 Encounter for immunization: Secondary | ICD-10-CM | POA: Diagnosis not present

## 2014-02-16 DIAGNOSIS — I10 Essential (primary) hypertension: Secondary | ICD-10-CM | POA: Diagnosis not present

## 2014-03-05 ENCOUNTER — Other Ambulatory Visit: Payer: Self-pay | Admitting: Vascular Surgery

## 2014-03-05 DIAGNOSIS — I6529 Occlusion and stenosis of unspecified carotid artery: Secondary | ICD-10-CM | POA: Diagnosis not present

## 2014-03-05 LAB — BUN: BUN: 16 mg/dL (ref 6–23)

## 2014-03-05 LAB — CREATININE, SERUM: Creat: 0.83 mg/dL (ref 0.50–1.35)

## 2014-03-08 ENCOUNTER — Encounter: Payer: Self-pay | Admitting: Vascular Surgery

## 2014-03-09 ENCOUNTER — Other Ambulatory Visit (HOSPITAL_COMMUNITY): Payer: Medicare Other

## 2014-03-09 ENCOUNTER — Ambulatory Visit (INDEPENDENT_AMBULATORY_CARE_PROVIDER_SITE_OTHER)
Admission: RE | Admit: 2014-03-09 | Discharge: 2014-03-09 | Disposition: A | Discharge status: Home / Self Care | Payer: Medicare Other | Source: Ambulatory Visit | Attending: Vascular Surgery | Admitting: Vascular Surgery

## 2014-03-09 ENCOUNTER — Ambulatory Visit (INDEPENDENT_AMBULATORY_CARE_PROVIDER_SITE_OTHER): Payer: Medicare Other | Admitting: Vascular Surgery

## 2014-03-09 ENCOUNTER — Ambulatory Visit: Payer: Medicare Other | Admitting: Vascular Surgery

## 2014-03-09 ENCOUNTER — Ambulatory Visit (HOSPITAL_COMMUNITY)
Admission: RE | Admit: 2014-03-09 | Discharge: 2014-03-09 | Disposition: A | Discharge status: Home / Self Care | Payer: Medicare Other | Source: Ambulatory Visit | Attending: Vascular Surgery | Admitting: Vascular Surgery

## 2014-03-09 ENCOUNTER — Ambulatory Visit
Admission: RE | Admit: 2014-03-09 | Discharge: 2014-03-09 | Disposition: A | Discharge status: Home / Self Care | Payer: Medicare Other | Source: Ambulatory Visit | Attending: Vascular Surgery | Admitting: Vascular Surgery

## 2014-03-09 ENCOUNTER — Encounter: Payer: Self-pay | Admitting: Vascular Surgery

## 2014-03-09 ENCOUNTER — Encounter (HOSPITAL_COMMUNITY): Payer: Medicare Other

## 2014-03-09 VITALS — BP 133/62 | HR 76 | Ht 70.0 in | Wt 222.1 lb

## 2014-03-09 DIAGNOSIS — Z48812 Encounter for surgical aftercare following surgery on the circulatory system: Secondary | ICD-10-CM

## 2014-03-09 DIAGNOSIS — I723 Aneurysm of iliac artery: Secondary | ICD-10-CM

## 2014-03-09 DIAGNOSIS — I739 Peripheral vascular disease, unspecified: Secondary | ICD-10-CM | POA: Diagnosis not present

## 2014-03-09 DIAGNOSIS — I6523 Occlusion and stenosis of bilateral carotid arteries: Secondary | ICD-10-CM | POA: Insufficient documentation

## 2014-03-09 DIAGNOSIS — I701 Atherosclerosis of renal artery: Secondary | ICD-10-CM | POA: Diagnosis not present

## 2014-03-09 DIAGNOSIS — I6522 Occlusion and stenosis of left carotid artery: Secondary | ICD-10-CM | POA: Diagnosis not present

## 2014-03-09 DIAGNOSIS — I745 Embolism and thrombosis of iliac artery: Secondary | ICD-10-CM | POA: Diagnosis not present

## 2014-03-09 MED ORDER — IOHEXOL 350 MG/ML SOLN
80.0000 mL | Freq: Once | INTRAVENOUS | Status: AC | PRN
  Administered 2014-03-09: 80 mL via INTRAVENOUS

## 2014-03-09 NOTE — Progress Notes (Signed)
Subjective:     Patient ID: Samuel Berger, male   DOB: 1945-08-11, 67 y.o.   MRN: [id number]  HPI this 68 year old male returns for continued follow-up regarding his carotid occlusive disease, right common iliac artery aneurysm, and left common iliac artery occlusion. He has had no neurologic symptoms including lateralizing weakness, aphasia, amaurosis fugax, diplopia, blurred vision, or syncope. Since I last saw him one year ago. He has stable claudication which allows him to walk about 1/2 mile before developing left buttock and thigh symptoms. He is elderly plague off 3 times per week without difficulty. He has no history of rest pain gangrene or nonhealing ulcers.  Past Medical History  Diagnosis Date  . Hypertension   . Hyperlipidemia   . Iliac artery aneurysm, right   . CAD (coronary artery disease)   . Type II or unspecified type diabetes mellitus without mention of complication, not stated as uncontrolled   . GERD (gastroesophageal reflux disease)   . Aneurysm artery, iliac   . Stroke     01-05-2012   02-2012  . Kidney stone   . Hepatitis     ? age 49 or 58  . Carotid artery occlusion   . Dyslipidemia     History  Substance Use Topics  . Smoking status: Former Smoker -- 0.50 packs/day for 30 years    Types: Cigars    Quit date: 01/05/2012  . Smokeless tobacco: Former Systems developer  . Alcohol Use: Yes     Comment: quit 2 months..Patient drinks coffee daily.    Family History  Problem Relation Age of Onset  . Diabetes Mother   . Lung cancer Father     Allergies  Allergen Reactions  . Bee Zee [B Complex-C-E-Zn] Hives  . Metformin And Related     dizziness  . Sulfa Drugs Cross Reactors Other (See Comments)    Unknown    Current outpatient prescriptions: amLODipine (NORVASC) 10 MG tablet, Take 10 mg by mouth daily., Disp: , Rfl: ;  atorvastatin (LIPITOR) 40 MG tablet, Take 40 mg by mouth daily at 6 PM., Disp: , Rfl: ;  cephALEXin (KEFLEX) 500 MG capsule, , Disp: , Rfl: ;   clopidogrel (PLAVIX) 75 MG tablet, Take 75 mg by mouth daily with breakfast., Disp: , Rfl: ;  lisinopril (PRINIVIL,ZESTRIL) 5 MG tablet, Take 5 mg by mouth daily., Disp: , Rfl:  pioglitazone (ACTOS) 30 MG tablet, Take 30 mg by mouth daily., Disp: , Rfl:   BP 133/62 mmHg  Pulse 76  Ht 5\' 10"  (1.778 m)  Wt 222 lb 1.6 oz (100.744 kg)  BMI 31.87 kg/m2  SpO2 99%  Body mass index is 31.87 kg/(m^2).           Review of Systems denies chest pain   dyspnea on exertion, PND, orthopnea, hemoptysis. Does have esophageal reflux occasional esophagitis type symptoms. Other systems negative and a complete review of systems Objective:   Physical Exam BP 133/62 mmHg  Pulse 76  Ht 5\' 10"  (1.778 m)  Wt 222 lb 1.6 oz (100.744 kg)  BMI 31.87 kg/m2  SpO2 99%  Gen.-alert and oriented x3 in no apparent distress HEENT normal for age Lungs no rhonchi or wheezing Cardiovascular regular rhythm no murmurs carotid pulses 3+ palpable no bruits audible Abdomen soft nontender no palpable masses Musculoskeletal free of  major deformities Skin clear -no rashes Neurologic normal Lower extremities 3+ femoral pulse on right with no popliteal or distal pulses palpable Absent left femoral and distal pulses. Both  feet adequately perfused.  Today I ordered a carotid duplex exam which I reviewed and interpreted. The right carotid endarterectomy site is widely patent and there is about 40% stenosis of the left internal carotid artery which is unchanged from previous study Also ordered ABIs of the lower extremities which revealed 0.67 on the right and 0.61 on the left with monophasic flow bilaterally and essentially unchanged Also ordered a CT angiogram to evaluate and follow the right common iliac artery aneurysm. I reviewed this by computer. He does have slight enlargement of the right common iliac artery from last study-previously 2.6 now 2.9 cm maximum diameter. There is diffuse infrarenal aortic occlusive disease  and near total occlusion of the right internal iliac artery as well as known occlusion of the left common iliac artery       Assessment:     #1 no evidence of progression of left carotid occlusive disease and widely patent right carotid endarterectomy site-asymptomatic #2 known left common iliac artery occlusion was stable claudication #32.9 cm right common iliac artery aneurysm which has slightly enlarged over the past 12 months from 2.6 cm    Plan:     Return in one year for repeat studies as above to continue to follow these problems

## 2014-04-14 DIAGNOSIS — G4733 Obstructive sleep apnea (adult) (pediatric): Secondary | ICD-10-CM | POA: Diagnosis not present

## 2014-04-14 DIAGNOSIS — G4710 Hypersomnia, unspecified: Secondary | ICD-10-CM | POA: Diagnosis not present

## 2014-05-31 ENCOUNTER — Other Ambulatory Visit: Payer: Self-pay | Admitting: Dermatology

## 2014-05-31 DIAGNOSIS — L821 Other seborrheic keratosis: Secondary | ICD-10-CM | POA: Diagnosis not present

## 2014-05-31 DIAGNOSIS — L570 Actinic keratosis: Secondary | ICD-10-CM | POA: Diagnosis not present

## 2014-05-31 DIAGNOSIS — Z85828 Personal history of other malignant neoplasm of skin: Secondary | ICD-10-CM | POA: Diagnosis not present

## 2014-05-31 DIAGNOSIS — D485 Neoplasm of uncertain behavior of skin: Secondary | ICD-10-CM | POA: Diagnosis not present

## 2014-06-04 DIAGNOSIS — H47012 Ischemic optic neuropathy, left eye: Secondary | ICD-10-CM | POA: Diagnosis not present

## 2014-06-04 DIAGNOSIS — H35341 Macular cyst, hole, or pseudohole, right eye: Secondary | ICD-10-CM | POA: Diagnosis not present

## 2014-06-04 DIAGNOSIS — H25813 Combined forms of age-related cataract, bilateral: Secondary | ICD-10-CM | POA: Diagnosis not present

## 2014-06-04 DIAGNOSIS — H547 Unspecified visual loss: Secondary | ICD-10-CM | POA: Diagnosis not present

## 2014-07-06 DIAGNOSIS — H3531 Nonexudative age-related macular degeneration: Secondary | ICD-10-CM | POA: Diagnosis not present

## 2014-07-06 DIAGNOSIS — H2512 Age-related nuclear cataract, left eye: Secondary | ICD-10-CM | POA: Diagnosis not present

## 2014-07-06 DIAGNOSIS — H2511 Age-related nuclear cataract, right eye: Secondary | ICD-10-CM | POA: Diagnosis not present

## 2014-07-06 DIAGNOSIS — H18411 Arcus senilis, right eye: Secondary | ICD-10-CM | POA: Diagnosis not present

## 2014-07-06 DIAGNOSIS — H18412 Arcus senilis, left eye: Secondary | ICD-10-CM | POA: Diagnosis not present

## 2014-08-09 DIAGNOSIS — H2511 Age-related nuclear cataract, right eye: Secondary | ICD-10-CM | POA: Diagnosis not present

## 2014-08-09 DIAGNOSIS — H25011 Cortical age-related cataract, right eye: Secondary | ICD-10-CM | POA: Diagnosis not present

## 2014-08-09 DIAGNOSIS — H25811 Combined forms of age-related cataract, right eye: Secondary | ICD-10-CM | POA: Diagnosis not present

## 2014-08-10 DIAGNOSIS — H2512 Age-related nuclear cataract, left eye: Secondary | ICD-10-CM | POA: Diagnosis not present

## 2014-08-23 DIAGNOSIS — H25012 Cortical age-related cataract, left eye: Secondary | ICD-10-CM | POA: Diagnosis not present

## 2014-08-23 DIAGNOSIS — H2512 Age-related nuclear cataract, left eye: Secondary | ICD-10-CM | POA: Diagnosis not present

## 2014-08-23 DIAGNOSIS — H25812 Combined forms of age-related cataract, left eye: Secondary | ICD-10-CM | POA: Diagnosis not present

## 2014-09-13 DIAGNOSIS — Z1389 Encounter for screening for other disorder: Secondary | ICD-10-CM | POA: Diagnosis not present

## 2014-09-13 DIAGNOSIS — I714 Abdominal aortic aneurysm, without rupture: Secondary | ICD-10-CM | POA: Diagnosis not present

## 2014-09-13 DIAGNOSIS — I739 Peripheral vascular disease, unspecified: Secondary | ICD-10-CM | POA: Diagnosis not present

## 2014-09-13 DIAGNOSIS — N400 Enlarged prostate without lower urinary tract symptoms: Secondary | ICD-10-CM | POA: Diagnosis not present

## 2014-09-13 DIAGNOSIS — E119 Type 2 diabetes mellitus without complications: Secondary | ICD-10-CM | POA: Diagnosis not present

## 2014-09-13 DIAGNOSIS — E780 Pure hypercholesterolemia: Secondary | ICD-10-CM | POA: Diagnosis not present

## 2014-09-13 DIAGNOSIS — Z23 Encounter for immunization: Secondary | ICD-10-CM | POA: Diagnosis not present

## 2014-09-13 DIAGNOSIS — I10 Essential (primary) hypertension: Secondary | ICD-10-CM | POA: Diagnosis not present

## 2014-09-13 DIAGNOSIS — I639 Cerebral infarction, unspecified: Secondary | ICD-10-CM | POA: Diagnosis not present

## 2014-09-13 DIAGNOSIS — Z794 Long term (current) use of insulin: Secondary | ICD-10-CM | POA: Diagnosis not present

## 2014-09-13 DIAGNOSIS — Z0000 Encounter for general adult medical examination without abnormal findings: Secondary | ICD-10-CM | POA: Diagnosis not present

## 2014-09-13 DIAGNOSIS — I723 Aneurysm of iliac artery: Secondary | ICD-10-CM | POA: Diagnosis not present

## 2014-09-27 ENCOUNTER — Other Ambulatory Visit: Payer: Self-pay

## 2014-12-15 ENCOUNTER — Ambulatory Visit (INDEPENDENT_AMBULATORY_CARE_PROVIDER_SITE_OTHER): Payer: Medicare Other | Admitting: Neurology

## 2014-12-15 ENCOUNTER — Encounter: Payer: Self-pay | Admitting: Neurology

## 2014-12-15 VITALS — BP 126/63 | HR 74 | Ht 71.0 in | Wt 216.0 lb

## 2014-12-15 DIAGNOSIS — I6990 Unspecified sequelae of unspecified cerebrovascular disease: Secondary | ICD-10-CM

## 2014-12-15 NOTE — Progress Notes (Signed)
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 Vascular risk factors of hypertension, hyperlipidemia, diabetes, coronary artery disease, extracranial right carotid stenosis status was right carotid endarterectomy.   He returns for followup after last visit on 06/12/13 with me. He states is doing well without recurrent stroke or TIA symptoms.  He just had tooth extraction earlier today for a tooth that had cracked and it is starting to hurt. His blood pressure is well controlled and it is 118/70 in office today. He had his lipid profile checked in March with his primary physician and states that it was good. He has an appointment in December with Dr. Kellie Simmering and will have carotid ultrasound study in that visit. He states his fasting sugars have all been in good control. He is tolerating Plavix well with no signs of significant bleeding or bruising. He has no new complaints today. He plans a 3 week vacation over Christmas in [place] to golf and relax. Update 12/15/2014 : He returns for f/u after last visit 1 year ago and continues to do well without neurovascular symptoms  Since 3 years. He saw Dr Kellie Simmering in Dec 2015 and f/u carotid dopplers were fine.He remains on Plavix which he is tolerating well with only minor bruising.His blood pressure is well controlled and is 126/63 today in our office.He had lipids checked 4 months ago and they were satisfactory.His last HbA1c was on target at 6 as well. ROS:  14 system review of systems is positive for no complaints and all systems negative ALLERGIES: Allergies  Allergen Reactions  . Bee Zee [B Complex-C-E-Zn] Hives  . Metformin And Related     dizziness  . Sulfa Drugs Cross Reactors Other (See  Comments)    Unknown    HOME MEDICATIONS: Outpatient Prescriptions Prior to Visit  Medication Sig Dispense Refill  . amLODipine (NORVASC) 10 MG tablet Take 10 mg by mouth daily.    Marland Kitchen atorvastatin (LIPITOR) 40 MG tablet Take 40 mg by mouth daily at 6 PM.    . clopidogrel (PLAVIX) 75 MG tablet Take 75 mg by mouth daily with breakfast.    . lisinopril (PRINIVIL,ZESTRIL) 5 MG tablet Take 5 mg by mouth daily.    . pioglitazone (ACTOS) 30 MG tablet Take 30 mg by mouth daily.    . cephALEXin (KEFLEX) 500 MG capsule      No facility-administered medications prior to visit.    PHYSICAL EXAM Filed Vitals:   12/15/14 1524  BP: 126/63  Pulse: 74  Height: 5\' 11"  (1.803 m)  Weight: 216 lb (97.977 kg)   Body mass index is 30.14 kg/(m^2).  Generalized: Well developed, in no acute distress  Head: normocephalic and atraumatic.   Neck: Supple, no carotid bruits  Cardiac: Regular rate rhythm, no murmur  Musculoskeletal: No deformity   Neurological examination  Mentation: Alert oriented to time, place, history taking. Follows all commands speech and language fluent Cranial nerve II-XII: Fundoscopic exam not done. Pupils were equal round reactive to light extraocular movements were full, visual field were full on confrontational test. Facial sensation and strength were normal. hearing was intact to finger rubbing bilaterally. Uvula tongue midline. head turning and shoulder shrug and were normal and symmetric.Tongue protrusion into cheek strength was normal. Motor: The  motor testing reveals 5 over 5 strength of all 4 extremities. Good symmetric motor tone is noted throughout.  Sensory: Sensory testing is intact to soft touch on all 4 extremities. No evidence of extinction is noted.  Coordination: Cerebellar testing reveals good finger-nose-finger and heel-to-shin bilaterally.  Gait and station: Gait is normal. Tandem gait is normal. Romberg is negative. Reflexes: Deep tendon reflexes are symmetric  and normal bilaterally.   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 Vascular risk factors of hypertension, hyperlipidemia, diabetes, coronary artery disease, extracranial right carotid stenosis status post right carotid endarterectomy. Doing very well.   PLAN:  I had a long d/w patient about his remote stroke, risk for recurrent stroke/TIAs, personally independently reviewed imaging studies and stroke evaluation results and answered questions.Continue Plavix 75 mg daily  for secondary stroke prevention and maintain strict control of hypertension with blood pressure goal below 130/90, diabetes with hemoglobin A1c goal below 6.5% and lipids with LDL cholesterol goal below 100 mg/dL. I also advised the patient to eat a healthy diet with plenty of whole grains, cereals, fruits and vegetables, exercise regularly and maintain ideal body weight. Since the patient has been free of new neurovascular symptoms for 3 years and his risk factors are well controlled I recommend he need not see me for routine scheduled follow-up appointment but continue follow-up with his vascular surgeon and primary physician. Followup in the future with me in the future only if necessary.  Antony Contras, MD  12/15/2014, 8:17 PM [facility] [address], [place], [place] [address] [phone]  Note: This document was prepared with digital dictation and possible smart phrase technology. Any transcriptional errors that result from this process are unintentional.

## 2014-12-15 NOTE — Patient Instructions (Signed)
I had a long d/w patient about his remote stroke, risk for recurrent stroke/TIAs, personally independently reviewed imaging studies and stroke evaluation results and answered questions.Continue Plavix 75 mg daily  for secondary stroke prevention and maintain strict control of hypertension with blood pressure goal below 130/90, diabetes with hemoglobin A1c goal below 6.5% and lipids with LDL cholesterol goal below 100 mg/dL. I also advised the patient to eat a healthy diet with plenty of whole grains, cereals, fruits and vegetables, exercise regularly and maintain ideal body weight. Since the patient has been free of new neurovascular symptoms for 3 years and his risk factors are well controlled I recommend he need not see me for routine scheduled follow-up appointment but continue follow-up with his vascular surgeon and primary physician. Followup in the future with me in the future only if necessary.

## 2014-12-23 DIAGNOSIS — Z1211 Encounter for screening for malignant neoplasm of colon: Secondary | ICD-10-CM | POA: Diagnosis not present

## 2014-12-23 DIAGNOSIS — Z0000 Encounter for general adult medical examination without abnormal findings: Secondary | ICD-10-CM | POA: Diagnosis not present

## 2015-01-18 DIAGNOSIS — Z1211 Encounter for screening for malignant neoplasm of colon: Secondary | ICD-10-CM | POA: Diagnosis not present

## 2015-02-21 ENCOUNTER — Other Ambulatory Visit: Payer: Self-pay

## 2015-02-21 DIAGNOSIS — I723 Aneurysm of iliac artery: Secondary | ICD-10-CM

## 2015-03-01 ENCOUNTER — Other Ambulatory Visit: Payer: Self-pay | Admitting: Vascular Surgery

## 2015-03-01 DIAGNOSIS — Z01812 Encounter for preprocedural laboratory examination: Secondary | ICD-10-CM | POA: Diagnosis not present

## 2015-03-01 LAB — BUN: BUN: 19 mg/dL (ref 7–25)

## 2015-03-01 LAB — CREATININE, SERUM: Creat: 0.86 mg/dL (ref 0.70–1.18)

## 2015-03-07 ENCOUNTER — Ambulatory Visit
Admission: RE | Admit: 2015-03-07 | Discharge: 2015-03-07 | Disposition: A | Discharge status: Home / Self Care | Payer: Medicare Other | Source: Ambulatory Visit | Attending: Vascular Surgery | Admitting: Vascular Surgery

## 2015-03-07 DIAGNOSIS — I723 Aneurysm of iliac artery: Secondary | ICD-10-CM

## 2015-03-07 MED ORDER — IOPAMIDOL (ISOVUE-370) INJECTION 76%
75.0000 mL | Freq: Once | INTRAVENOUS | Status: AC | PRN
  Administered 2015-03-07: 75 mL via INTRAVENOUS

## 2015-03-09 ENCOUNTER — Other Ambulatory Visit: Payer: Self-pay | Admitting: *Deleted

## 2015-03-09 DIAGNOSIS — I6523 Occlusion and stenosis of bilateral carotid arteries: Secondary | ICD-10-CM

## 2015-03-09 DIAGNOSIS — I771 Stricture of artery: Secondary | ICD-10-CM

## 2015-03-09 DIAGNOSIS — Z48812 Encounter for surgical aftercare following surgery on the circulatory system: Secondary | ICD-10-CM

## 2015-03-15 ENCOUNTER — Ambulatory Visit (HOSPITAL_COMMUNITY)
Admission: RE | Admit: 2015-03-15 | Discharge: 2015-03-15 | Disposition: A | Discharge status: Home / Self Care | Payer: Medicare Other | Source: Ambulatory Visit | Attending: Vascular Surgery | Admitting: Vascular Surgery

## 2015-03-15 ENCOUNTER — Ambulatory Visit (INDEPENDENT_AMBULATORY_CARE_PROVIDER_SITE_OTHER)
Admission: RE | Admit: 2015-03-15 | Discharge: 2015-03-15 | Disposition: A | Discharge status: Home / Self Care | Payer: Medicare Other | Source: Ambulatory Visit | Attending: Vascular Surgery | Admitting: Vascular Surgery

## 2015-03-15 ENCOUNTER — Ambulatory Visit: Payer: Medicare Other | Admitting: Vascular Surgery

## 2015-03-15 DIAGNOSIS — E785 Hyperlipidemia, unspecified: Secondary | ICD-10-CM | POA: Diagnosis not present

## 2015-03-15 DIAGNOSIS — Z48812 Encounter for surgical aftercare following surgery on the circulatory system: Secondary | ICD-10-CM | POA: Diagnosis not present

## 2015-03-15 DIAGNOSIS — I6523 Occlusion and stenosis of bilateral carotid arteries: Secondary | ICD-10-CM

## 2015-03-15 DIAGNOSIS — I771 Stricture of artery: Secondary | ICD-10-CM | POA: Insufficient documentation

## 2015-03-15 DIAGNOSIS — I6522 Occlusion and stenosis of left carotid artery: Secondary | ICD-10-CM | POA: Diagnosis not present

## 2015-03-15 DIAGNOSIS — I10 Essential (primary) hypertension: Secondary | ICD-10-CM | POA: Insufficient documentation

## 2015-03-15 DIAGNOSIS — E119 Type 2 diabetes mellitus without complications: Secondary | ICD-10-CM | POA: Insufficient documentation

## 2015-03-16 ENCOUNTER — Encounter: Payer: Self-pay | Admitting: Vascular Surgery

## 2015-03-22 ENCOUNTER — Encounter: Payer: Self-pay | Admitting: Vascular Surgery

## 2015-03-22 ENCOUNTER — Ambulatory Visit (INDEPENDENT_AMBULATORY_CARE_PROVIDER_SITE_OTHER): Payer: Medicare Other | Admitting: Vascular Surgery

## 2015-03-22 VITALS — BP 122/77 | HR 87 | Temp 97.0°F | Resp 16 | Ht 72.0 in | Wt 219.0 lb

## 2015-03-22 DIAGNOSIS — I6523 Occlusion and stenosis of bilateral carotid arteries: Secondary | ICD-10-CM | POA: Diagnosis not present

## 2015-03-22 DIAGNOSIS — I723 Aneurysm of iliac artery: Secondary | ICD-10-CM | POA: Diagnosis not present

## 2015-03-22 DIAGNOSIS — I6521 Occlusion and stenosis of right carotid artery: Secondary | ICD-10-CM | POA: Diagnosis not present

## 2015-03-22 NOTE — Progress Notes (Signed)
Subjective:     Patient ID: Samuel Berger, male   DOB: Jan 30, 1946, 69 y.o.   MRN: [id number]  HPI This 69 year old male returns for continued follow-up regarding his carotid occlusive disease, left iliac occlusion, and right iliac aneurysm. Patient denies neurologic symptoms such as lateralizing weakness, aphasia, amaurosis fugax, diplopia, blurred vision, or syncope. He also denies claudication symptoms A is able to ambulate several blocks without any discomfort in either leg. He denies pain numbness or nonhealing ulcers in his feet.   Past Medical History  Diagnosis Date  . Hypertension   . Hyperlipidemia   . Iliac artery aneurysm, right ([place])   . Type II or unspecified type diabetes mellitus without mention of complication, not stated as uncontrolled   . GERD (gastroesophageal reflux disease)   . Aneurysm artery, iliac ([place])   . Stroke ([place])     01-05-2012   [id number]  . Hepatitis     ? age 53 or 1  . Carotid artery occlusion   . Dyslipidemia     Social History  Substance Use Topics  . Smoking status: Former Smoker -- 0.50 packs/day for 30 years    Types: Cigars    Quit date: 01/05/2012  . Smokeless tobacco: Former Systems developer  . Alcohol Use: 1.8 oz/week    1 Glasses of wine, 1 Cans of beer, 1 Shots of liquor per week     Comment: quit 2 months..Patient drinks coffee daily.OCCASIONAL DRINKER    Family History  Problem Relation Age of Onset  . Diabetes Mother   . Lung cancer Father     Allergies  Allergen Reactions  . Bee Zee [B Complex-C-E-Zn] Hives  . Metformin And Related     dizziness  . Sulfa Drugs Cross Reactors Other (See Comments)    Unknown     Current outpatient prescriptions:  .  amLODipine (NORVASC) 10 MG tablet, Take 10 mg by mouth daily., Disp: , Rfl:  .  atorvastatin (LIPITOR) 40 MG tablet, Take 40 mg by mouth daily at 6 PM., Disp: , Rfl:  .  clopidogrel (PLAVIX) 75 MG tablet, Take 75 mg by mouth daily with breakfast., Disp: , Rfl:  .  lisinopril  (PRINIVIL,ZESTRIL) 5 MG tablet, Take 5 mg by mouth daily., Disp: , Rfl:  .  pioglitazone (ACTOS) 30 MG tablet, Take 30 mg by mouth daily., Disp: , Rfl:   Filed Vitals:   03/22/15 1408 03/22/15 1410  BP: 129/75 122/77  Pulse: 85 87  Temp: 97 F (36.1 C)   Resp: 16   Height: 6' (1.829 m)   Weight: 219 lb (99.338 kg)   SpO2: 97%     Body mass index is 29.7 kg/(m^2).           Review of Systems Denies chest pain, dyspnea on exertion, PND, orthopnea, hemoptysis, claudication. Does have history of GERD, hyperlipidemia, remote history of stroke. Other systems negative and complete review of systems     Objective:   Physical Exam BP 122/77 mmHg  Pulse 87  Temp(Src) 97 F (36.1 C)  Resp 16  Ht 6' (1.829 m)  Wt 219 lb (99.338 kg)  BMI 29.70 kg/m2  SpO2 97%  Gen.-alert and oriented x3 in no apparent distress HEENT normal for age Lungs no rhonchi or wheezing Cardiovascular regular rhythm no murmurs carotid pulses 3+ palpable no bruits audible Abdomen soft nontender no palpable masses Musculoskeletal free of  major deformities Skin clear -no rashes Neurologic normal Lower extremities 3+ femoral  Pulse on the  right absent femoral pulse on the left Both feet adequately perfused with no palpable pulses. No evidence of ischemia.   Today I ordered ABIs which are unchanged from last year 0.67 on the right 0.61 on the left  also ordered duplex scan of the carotid arteries which is unchanged from last year with a widely patent right carotid endarterectomy site and minimal stenosis in the left internal carotid artery #3 also ordered a CT angiogram of the abdomen and pelvis to follow the right iliac aneurysm. It continues to be 2.8 cm in maximum diameter in the right common iliac artery with some laminated thrombus unchanged from last year. The left common iliac artery is known to be chronically occluded.       Assessment:      #1 stable right common iliac artery aneurysm 2.8 cm  maximum diameter #2 left common iliac artery occlusion with no claudication symptoms  #3 status post right carotid endarterectomy in 2013 with no evidence of restenosis or contralateral disease which is significant     Plan:      patient return in 1 year and see nurse practitioner with same studies that were ordered this year for continued follow-up

## 2015-03-23 NOTE — Addendum Note (Signed)
Addended by: Dorthula Rue L on: 03/23/2015 10:00 AM   Modules accepted: Orders

## 2015-04-13 DIAGNOSIS — I739 Peripheral vascular disease, unspecified: Secondary | ICD-10-CM | POA: Diagnosis not present

## 2015-04-13 DIAGNOSIS — I723 Aneurysm of iliac artery: Secondary | ICD-10-CM | POA: Diagnosis not present

## 2015-04-13 DIAGNOSIS — I10 Essential (primary) hypertension: Secondary | ICD-10-CM | POA: Diagnosis not present

## 2015-04-13 DIAGNOSIS — E119 Type 2 diabetes mellitus without complications: Secondary | ICD-10-CM | POA: Diagnosis not present

## 2015-04-13 DIAGNOSIS — Z23 Encounter for immunization: Secondary | ICD-10-CM | POA: Diagnosis not present

## 2015-04-13 DIAGNOSIS — E7800 Pure hypercholesterolemia, unspecified: Secondary | ICD-10-CM | POA: Diagnosis not present

## 2015-04-13 DIAGNOSIS — I639 Cerebral infarction, unspecified: Secondary | ICD-10-CM | POA: Diagnosis not present

## 2015-04-13 DIAGNOSIS — Z7984 Long term (current) use of oral hypoglycemic drugs: Secondary | ICD-10-CM | POA: Diagnosis not present

## 2015-04-13 DIAGNOSIS — I714 Abdominal aortic aneurysm, without rupture: Secondary | ICD-10-CM | POA: Diagnosis not present

## 2015-04-13 DIAGNOSIS — I700 Atherosclerosis of aorta: Secondary | ICD-10-CM | POA: Diagnosis not present

## 2015-06-22 DIAGNOSIS — Z961 Presence of intraocular lens: Secondary | ICD-10-CM | POA: Diagnosis not present

## 2015-06-22 DIAGNOSIS — H47292 Other optic atrophy, left eye: Secondary | ICD-10-CM | POA: Diagnosis not present

## 2015-06-22 DIAGNOSIS — H5203 Hypermetropia, bilateral: Secondary | ICD-10-CM | POA: Diagnosis not present

## 2015-06-22 DIAGNOSIS — H524 Presbyopia: Secondary | ICD-10-CM | POA: Diagnosis not present

## 2015-06-22 DIAGNOSIS — D485 Neoplasm of uncertain behavior of skin: Secondary | ICD-10-CM | POA: Diagnosis not present

## 2015-06-22 DIAGNOSIS — C44311 Basal cell carcinoma of skin of nose: Secondary | ICD-10-CM | POA: Diagnosis not present

## 2015-06-22 DIAGNOSIS — H52223 Regular astigmatism, bilateral: Secondary | ICD-10-CM | POA: Diagnosis not present

## 2015-06-22 DIAGNOSIS — H47012 Ischemic optic neuropathy, left eye: Secondary | ICD-10-CM | POA: Diagnosis not present

## 2015-06-22 DIAGNOSIS — H33309 Unspecified retinal break, unspecified eye: Secondary | ICD-10-CM | POA: Diagnosis not present

## 2015-06-22 DIAGNOSIS — Z85828 Personal history of other malignant neoplasm of skin: Secondary | ICD-10-CM | POA: Diagnosis not present

## 2015-07-18 DIAGNOSIS — C44311 Basal cell carcinoma of skin of nose: Secondary | ICD-10-CM | POA: Diagnosis not present

## 2015-07-18 DIAGNOSIS — Z85828 Personal history of other malignant neoplasm of skin: Secondary | ICD-10-CM | POA: Diagnosis not present

## 2015-09-14 DIAGNOSIS — Z0000 Encounter for general adult medical examination without abnormal findings: Secondary | ICD-10-CM | POA: Diagnosis not present

## 2015-09-14 DIAGNOSIS — I10 Essential (primary) hypertension: Secondary | ICD-10-CM | POA: Diagnosis not present

## 2015-09-14 DIAGNOSIS — I739 Peripheral vascular disease, unspecified: Secondary | ICD-10-CM | POA: Diagnosis not present

## 2015-09-14 DIAGNOSIS — I723 Aneurysm of iliac artery: Secondary | ICD-10-CM | POA: Diagnosis not present

## 2015-09-14 DIAGNOSIS — I714 Abdominal aortic aneurysm, without rupture: Secondary | ICD-10-CM | POA: Diagnosis not present

## 2015-09-14 DIAGNOSIS — E1165 Type 2 diabetes mellitus with hyperglycemia: Secondary | ICD-10-CM | POA: Diagnosis not present

## 2015-09-14 DIAGNOSIS — M542 Cervicalgia: Secondary | ICD-10-CM | POA: Diagnosis not present

## 2015-09-14 DIAGNOSIS — E119 Type 2 diabetes mellitus without complications: Secondary | ICD-10-CM | POA: Diagnosis not present

## 2015-09-14 DIAGNOSIS — I700 Atherosclerosis of aorta: Secondary | ICD-10-CM | POA: Diagnosis not present

## 2015-09-14 DIAGNOSIS — E7800 Pure hypercholesterolemia, unspecified: Secondary | ICD-10-CM | POA: Diagnosis not present

## 2015-09-14 DIAGNOSIS — Z1389 Encounter for screening for other disorder: Secondary | ICD-10-CM | POA: Diagnosis not present

## 2015-09-14 DIAGNOSIS — N400 Enlarged prostate without lower urinary tract symptoms: Secondary | ICD-10-CM | POA: Diagnosis not present

## 2015-09-14 DIAGNOSIS — I639 Cerebral infarction, unspecified: Secondary | ICD-10-CM | POA: Diagnosis not present

## 2015-10-12 DIAGNOSIS — D696 Thrombocytopenia, unspecified: Secondary | ICD-10-CM | POA: Diagnosis not present

## 2015-10-24 DIAGNOSIS — R000 Tachycardia, unspecified: Secondary | ICD-10-CM | POA: Diagnosis not present

## 2015-10-24 DIAGNOSIS — R109 Unspecified abdominal pain: Secondary | ICD-10-CM | POA: Diagnosis not present

## 2015-11-18 ENCOUNTER — Ambulatory Visit
Admission: RE | Admit: 2015-11-18 | Discharge: 2015-11-18 | Disposition: A | Discharge status: Home / Self Care | Payer: Medicare Other | Source: Ambulatory Visit | Attending: Internal Medicine | Admitting: Internal Medicine

## 2015-11-18 ENCOUNTER — Other Ambulatory Visit: Payer: Self-pay | Admitting: Internal Medicine

## 2015-11-18 DIAGNOSIS — N200 Calculus of kidney: Secondary | ICD-10-CM | POA: Diagnosis not present

## 2015-11-18 DIAGNOSIS — R0602 Shortness of breath: Secondary | ICD-10-CM | POA: Diagnosis not present

## 2015-11-18 DIAGNOSIS — R509 Fever, unspecified: Secondary | ICD-10-CM | POA: Diagnosis not present

## 2015-11-18 DIAGNOSIS — J189 Pneumonia, unspecified organism: Secondary | ICD-10-CM | POA: Diagnosis not present

## 2015-11-28 DIAGNOSIS — J189 Pneumonia, unspecified organism: Secondary | ICD-10-CM | POA: Diagnosis not present

## 2015-12-12 ENCOUNTER — Ambulatory Visit
Admission: RE | Admit: 2015-12-12 | Discharge: 2015-12-12 | Disposition: A | Discharge status: Home / Self Care | Payer: Medicare Other | Source: Ambulatory Visit | Attending: Internal Medicine | Admitting: Internal Medicine

## 2015-12-12 ENCOUNTER — Other Ambulatory Visit: Payer: Self-pay | Admitting: Internal Medicine

## 2015-12-12 DIAGNOSIS — J189 Pneumonia, unspecified organism: Secondary | ICD-10-CM

## 2016-01-04 ENCOUNTER — Other Ambulatory Visit: Payer: Self-pay | Admitting: Internal Medicine

## 2016-01-04 DIAGNOSIS — R110 Nausea: Secondary | ICD-10-CM | POA: Diagnosis not present

## 2016-01-04 DIAGNOSIS — R1084 Generalized abdominal pain: Secondary | ICD-10-CM

## 2016-01-04 DIAGNOSIS — R630 Anorexia: Secondary | ICD-10-CM | POA: Diagnosis not present

## 2016-01-04 DIAGNOSIS — R109 Unspecified abdominal pain: Secondary | ICD-10-CM | POA: Diagnosis not present

## 2016-01-04 DIAGNOSIS — R0602 Shortness of breath: Secondary | ICD-10-CM | POA: Diagnosis not present

## 2016-01-06 ENCOUNTER — Ambulatory Visit
Admission: RE | Admit: 2016-01-06 | Discharge: 2016-01-06 | Disposition: A | Discharge status: Home / Self Care | Payer: Medicare Other | Source: Ambulatory Visit | Attending: Internal Medicine | Admitting: Internal Medicine

## 2016-01-06 DIAGNOSIS — R599 Enlarged lymph nodes, unspecified: Secondary | ICD-10-CM | POA: Diagnosis not present

## 2016-01-06 DIAGNOSIS — R1084 Generalized abdominal pain: Secondary | ICD-10-CM

## 2016-01-06 DIAGNOSIS — R110 Nausea: Secondary | ICD-10-CM

## 2016-01-06 MED ORDER — IOPAMIDOL (ISOVUE-300) INJECTION 61%
100.0000 mL | Freq: Once | INTRAVENOUS | Status: AC | PRN
  Administered 2016-01-06: 100 mL via INTRAVENOUS

## 2016-01-09 DIAGNOSIS — R590 Localized enlarged lymph nodes: Secondary | ICD-10-CM | POA: Diagnosis not present

## 2016-01-16 ENCOUNTER — Encounter: Payer: Self-pay | Admitting: Nurse Practitioner

## 2016-01-16 ENCOUNTER — Telehealth: Payer: Self-pay | Admitting: *Deleted

## 2016-01-16 ENCOUNTER — Ambulatory Visit (INDEPENDENT_AMBULATORY_CARE_PROVIDER_SITE_OTHER): Payer: Medicare Other | Admitting: Nurse Practitioner

## 2016-01-16 VITALS — BP 150/80 | HR 111 | Ht 72.0 in | Wt 204.4 lb

## 2016-01-16 DIAGNOSIS — I739 Peripheral vascular disease, unspecified: Secondary | ICD-10-CM

## 2016-01-16 DIAGNOSIS — R0600 Dyspnea, unspecified: Secondary | ICD-10-CM | POA: Diagnosis not present

## 2016-01-16 DIAGNOSIS — I10 Essential (primary) hypertension: Secondary | ICD-10-CM | POA: Diagnosis not present

## 2016-01-16 DIAGNOSIS — E7800 Pure hypercholesterolemia, unspecified: Secondary | ICD-10-CM

## 2016-01-16 NOTE — Progress Notes (Signed)
CARDIOLOGY OFFICE NOTE  Date:  01/16/2016    Samuel Berger Date of Birth: 03-May-1945 Medical Record [id number]  PCP:  Wenda Low, MD  Cardiologist:  End  No chief complaint on file.   History of Present Illness: Samuel Berger is a 70 y.o. male who presents today for a new patient visit. Seen for Dr. Saunders Revel (DOD).   He has a history of HTN, PVD - followed by Dr. Kellie Simmering, prior stroke, COPD on CXR, past tobacco abuse, GERD, HLD and DM.   Seen by PCP earlier this month - having nonspecific abdominal pain - CT of the abdomen/pelvis with necrotic retroperitoneal adenopathy and pelvic adenopathy along the iliac chains. Apparently has also endorsed some dyspnea with swelling and was referred here for further evaluation.   Comes in today. Here alone. He feels "terrible".  In the last 60 days - he has had a UTI, kidney stone, pneumonia - has no energy. Feels like he has infection "thru out his whole body". Overall he feels "just terrible".  He notes he is not able to "keep up" - no longer able to play golf as much as he usually does - significant decrease in his energy level over the last 2 to 3 months. Notes he has had a considerable change overall in how he is doing over the past 60 days. Occasional swelling - in the left leg - comes and goes. Adamantly denies being short of breath. No chest pain. He has lost over 20 pounds in the past 3 months. He has not been trying to lose weight. He has had low grade fever. Has had some occasional night sweats. He has also noted what he thought was an enlarged lymph node in his neck a few weeks ago. He is seeing General Surgery on Thursday - does not remember who - assumes he will be set up for a biopsy. He notes he "just wants to feel better". He reports having considerable amount of labs done by PCP. Records reveal that the CBC and chemistries were unrevealing. Reports BP typically better at home.   Past Medical History:  Diagnosis Date  .  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 Carotid artery occlusion   . Dyslipidemia   . GERD (gastroesophageal reflux disease)   . Hepatitis    ? age 65 or 71  . Hyperlipidemia   . Hypertension   . Iliac artery aneurysm, right ([place])   . Stroke ([place])    01-05-2012   [id number]  . Type II or unspecified type diabetes mellitus without mention of complication, not stated as uncontrolled     Past Surgical History:  Procedure Laterality Date  . basket procedure    . CAROTID ENDARTERECTOMY    . ENDARTERECTOMY  02/08/2012   Procedure: ENDARTERECTOMY CAROTID;  Surgeon: Mal Misty, MD;  Location: [facility];  Service: Vascular;  Laterality: Right;  . KIDNEY STONE SURGERY     removal  . PATCH ANGIOPLASTY  02/08/2012   Procedure: PATCH ANGIOPLASTY;  Surgeon: Mal Misty, MD;  Location: [facility];  Service: Vascular;  Laterality: Right;  with dacron patch angioplasty  . TEE WITHOUT CARDIOVERSION  01/08/2012   Procedure: TRANSESOPHAGEAL ECHOCARDIOGRAM (TEE);  Surgeon: Lelon Perla, MD;  [facility] END[facility]COPY;  Service: Cardiovascular;  Laterality: N/A;  . TONSILLECTOMY       Medications: Current Outpatient Prescriptions  Medication Sig Dispense Refill  . amLODipine (NORVASC) 10 MG tablet Take 10 mg by mouth daily.    Marland Kitchen  atorvastatin (LIPITOR) 40 MG tablet Take 40 mg by mouth daily at 6 PM.    . clopidogrel (PLAVIX) 75 MG tablet Take 75 mg by mouth daily with breakfast.    . lisinopril (PRINIVIL,ZESTRIL) 5 MG tablet Take 5 mg by mouth daily.    . pioglitazone (ACTOS) 30 MG tablet Take 30 mg by mouth daily.     No current facility-administered medications for this visit.     Allergies: Allergies  Allergen Reactions  . Bee Zee [B Complex-C-E-Zn] Hives  . Metformin And Related     dizziness  . Sulfa Drugs Cross Reactors Other (See Comments)    Unknown    Social History: The patient  reports that he quit smoking about 4 years ago. His smoking use included Cigars. He has a 15.00 pack-year smoking  history. He has quit using smokeless tobacco. He reports that he drinks about 1.8 oz of alcohol per week . He reports that he does not use drugs.   Family History: The patient's family history includes Diabetes in his mother; Lung cancer in his father.   Review of Systems: Please see the history of present illness.   Otherwise, the review of systems is positive for none.   All other systems are reviewed and negative.   Physical Exam: VS:  BP (!) 150/80   Pulse (!) 111   Ht 6' (1.829 m)   Wt 204 lb 6.4 oz (92.7 kg)   BMI 27.72 kg/m  .  BMI Body mass index is 27.72 kg/m.  Wt Readings from Last 3 Encounters:  01/16/16 204 lb 6.4 oz (92.7 kg)  03/22/15 219 lb (99.3 kg)  12/15/14 216 lb (98 kg)    General: Pleasant. Well developed, well nourished and in no acute distress.   HEENT: Normal. His voice seems hoarse to me. No lymphadenopathy in the neck that I can note.  Neck: Supple, no JVD, carotid bruits, or masses noted.  Cardiac: Regular rate and rhythm. He is tachycardic. No murmurs, rubs, or gallops. No edema.  Respiratory:  Lungs are clear to auscultation bilaterally with normal work of breathing.  GI: Soft and nontender.  MS: No deformity or atrophy. Gait and ROM intact.  Skin: Warm and dry. Color is normal.  Neuro:  Strength and sensation are intact and no gross focal deficits noted.  Psych: Alert, appropriate and with normal affect.   LABORATORY DATA:  EKG:  EKG is ordered today. This demonstrates sinus tach. Reviewed with Dr. Saunders Revel (DOD)  Lab Results  Component Value Date   WBC 9.3 02/09/2012   HGB 11.9 (L) 02/09/2012   HCT 35.0 (L) 02/09/2012   PLT 129 (L) 02/09/2012   GLUCOSE 102 (H) 02/09/2012   CHOL 140 01/06/2012   TRIG 71 01/06/2012   HDL 43 01/06/2012   LDLCALC 83 01/06/2012   ALT 15 02/07/2012   AST 16 02/07/2012   NA 136 02/09/2012   K 4.0 02/09/2012   CL 103 02/09/2012   CREATININE 0.86 03/01/2015   BUN 19 03/01/2015   CO2 27 02/09/2012   TSH 0.950  02/01/2012   INR 0.92 02/07/2012   HGBA1C 6.4 (H) 02/01/2012    BNP (last 3 results) No results for input(s): BNP in the last 8760 hours.  ProBNP (last 3 results) No results for input(s): PROBNP in the last 8760 hours.   Other Studies Reviewed Today:  CT ABDOMEN IMPRESSION 01/06/2016: 1. New extensive centrally necrotic retroperitoneal and bilateral pelvic lymphadenopathy. Differential includes lymphoproliferative condition (lymphoma/leukemia) or infectious  lymphadenitis (such as due to bacterial, viral or mycobacterial etiologies). Squamous cell carcinoma metastases can have a similar appearance, although this is less likely given the location. Clinical and laboratory evaluation for a lymphoproliferative condition and tissue sampling is advised. 2. Aortic atherosclerosis. Stable 3.1 cm infrarenal abdominal aortic aneurysm. Recommend followup by ultrasound in 3 years. This recommendation follows ACR consensus guidelines: White Paper of the ACR Incidental Findings Committee II on Vascular Findings. J Am Coll Radiol 2013; 10:789-794. 3. Additional findings include coronary atherosclerosis, cholelithiasis and small fat containing left inguinal hernia.   Electronically Signed   By: Ilona Sorrel M.D.   On: 01/06/2016 12:34  Assessment/Plan: 1. Dyspnea - not really voiced as a complaint today but reported to PCP earlier this month. Noted normal BNP on records from PCP.   2. Abnormal abdominal/pelvic CT - seeing General Surgery on Thursday - worrisome for lymphoma.   2. PVD - followed by Dr. Kellie Simmering  3. Tachycardia - present on EKG from July - will arrange for echocardiogram.   4. HTN - BP better at home  5. Significant fatigue - in the setting of low grade fever, nightsweats and abnormal CT scan.   6. CV risk factors - these include gender, HTN and HLD. He has known PVD as well. He may in fact have CAD but I do not think this would explain his current situation. He is not  having any chest pain/shortness of breath - discussed with with Dr. Saunders Revel (DOD) - will arrange for the echo. Would proceed with his work up for this abnormal CT.   Current medicines are reviewed with the patient today.  The patient does not have concerns regarding medicines other than what has been noted above.  The following changes have been made:  See above.  Labs/ tests ordered today include:    Orders Placed This Encounter  Procedures  . EKG 12-Lead  . ECHOCARDIOGRAM COMPLETE     Disposition:   Further disposition pending.   Patient is agreeable to this plan and will call if any problems develop in the interim.   Signed: Burtis Junes, RN, ANP-C 01/16/2016 12:01 PM  [facility] [address] [place], [place]  [address] Phone: [phone] Fax: [phone]

## 2016-01-16 NOTE — Telephone Encounter (Signed)
Called [facility] [place] [facility] to see whom pt is seeing this week??  Dr. Kae Heller.

## 2016-01-16 NOTE — Patient Instructions (Addendum)
We will be checking the following labs today - NONE   Medication Instructions:    Continue with your current medicines.     Testing/Procedures To Be Arranged:  Echocardiogram  Lexiscan Myoview  Follow-Up:   We will see how your tests turn out and then decide about follow up.     Other Special Instructions:   N/A    If you need a refill on your cardiac medications before your next appointment, please call your pharmacy.   Call the [place] office at [phone] if you have any questions, problems or concerns.

## 2016-01-19 DIAGNOSIS — R590 Localized enlarged lymph nodes: Secondary | ICD-10-CM | POA: Diagnosis not present

## 2016-01-20 ENCOUNTER — Other Ambulatory Visit (HOSPITAL_COMMUNITY): Payer: Self-pay | Admitting: Surgery

## 2016-01-20 DIAGNOSIS — R590 Localized enlarged lymph nodes: Secondary | ICD-10-CM

## 2016-01-23 ENCOUNTER — Other Ambulatory Visit: Payer: Self-pay | Admitting: Surgery

## 2016-01-24 ENCOUNTER — Other Ambulatory Visit (HOSPITAL_COMMUNITY): Payer: Self-pay | Admitting: Surgery

## 2016-01-24 DIAGNOSIS — R590 Localized enlarged lymph nodes: Secondary | ICD-10-CM

## 2016-01-27 ENCOUNTER — Ambulatory Visit (HOSPITAL_COMMUNITY)
Admission: RE | Admit: 2016-01-27 | Discharge: 2016-01-27 | Disposition: A | Discharge status: Home / Self Care | Payer: Medicare Other | Source: Ambulatory Visit | Attending: Surgery | Admitting: Surgery

## 2016-01-27 ENCOUNTER — Encounter (HOSPITAL_COMMUNITY): Payer: Self-pay

## 2016-01-27 DIAGNOSIS — J189 Pneumonia, unspecified organism: Secondary | ICD-10-CM | POA: Diagnosis not present

## 2016-01-27 DIAGNOSIS — R590 Localized enlarged lymph nodes: Secondary | ICD-10-CM | POA: Insufficient documentation

## 2016-01-27 DIAGNOSIS — E042 Nontoxic multinodular goiter: Secondary | ICD-10-CM | POA: Diagnosis not present

## 2016-01-27 DIAGNOSIS — K8020 Calculus of gallbladder without cholecystitis without obstruction: Secondary | ICD-10-CM | POA: Diagnosis not present

## 2016-01-27 DIAGNOSIS — I2510 Atherosclerotic heart disease of native coronary artery without angina pectoris: Secondary | ICD-10-CM | POA: Diagnosis not present

## 2016-01-27 DIAGNOSIS — N281 Cyst of kidney, acquired: Secondary | ICD-10-CM | POA: Insufficient documentation

## 2016-01-27 MED ORDER — IOPAMIDOL (ISOVUE-370) INJECTION 76%
INTRAVENOUS | Status: AC
  Filled 2016-01-27: qty 100

## 2016-01-27 MED ORDER — IOPAMIDOL (ISOVUE-300) INJECTION 61%
INTRAVENOUS | Status: AC
  Administered 2016-01-27: 75 mL
  Filled 2016-01-27: qty 75

## 2016-01-31 ENCOUNTER — Other Ambulatory Visit: Payer: Self-pay

## 2016-01-31 ENCOUNTER — Ambulatory Visit (HOSPITAL_COMMUNITY): Payer: Medicare Other | Attending: Cardiology

## 2016-01-31 DIAGNOSIS — E119 Type 2 diabetes mellitus without complications: Secondary | ICD-10-CM | POA: Insufficient documentation

## 2016-01-31 DIAGNOSIS — I10 Essential (primary) hypertension: Secondary | ICD-10-CM | POA: Insufficient documentation

## 2016-01-31 DIAGNOSIS — R0600 Dyspnea, unspecified: Secondary | ICD-10-CM | POA: Insufficient documentation

## 2016-01-31 DIAGNOSIS — I358 Other nonrheumatic aortic valve disorders: Secondary | ICD-10-CM | POA: Diagnosis not present

## 2016-01-31 DIAGNOSIS — I739 Peripheral vascular disease, unspecified: Secondary | ICD-10-CM | POA: Diagnosis not present

## 2016-01-31 DIAGNOSIS — Z87891 Personal history of nicotine dependence: Secondary | ICD-10-CM | POA: Diagnosis not present

## 2016-01-31 DIAGNOSIS — E7800 Pure hypercholesterolemia, unspecified: Secondary | ICD-10-CM | POA: Insufficient documentation

## 2016-01-31 DIAGNOSIS — I071 Rheumatic tricuspid insufficiency: Secondary | ICD-10-CM | POA: Insufficient documentation

## 2016-02-01 ENCOUNTER — Ambulatory Visit (HOSPITAL_COMMUNITY): Payer: Medicare Other

## 2016-02-01 ENCOUNTER — Telehealth (HOSPITAL_COMMUNITY): Payer: Self-pay

## 2016-02-06 ENCOUNTER — Other Ambulatory Visit: Payer: Self-pay | Admitting: Radiology

## 2016-02-07 ENCOUNTER — Other Ambulatory Visit (HOSPITAL_COMMUNITY): Payer: Self-pay | Admitting: Surgery

## 2016-02-07 ENCOUNTER — Ambulatory Visit (HOSPITAL_COMMUNITY)
Admission: RE | Admit: 2016-02-07 | Discharge: 2016-02-07 | Disposition: A | Discharge status: Home / Self Care | Payer: Medicare Other | Source: Ambulatory Visit | Attending: Interventional Radiology | Admitting: Interventional Radiology

## 2016-02-07 ENCOUNTER — Encounter (HOSPITAL_COMMUNITY): Payer: Self-pay

## 2016-02-07 DIAGNOSIS — I10 Essential (primary) hypertension: Secondary | ICD-10-CM | POA: Insufficient documentation

## 2016-02-07 DIAGNOSIS — E119 Type 2 diabetes mellitus without complications: Secondary | ICD-10-CM | POA: Insufficient documentation

## 2016-02-07 DIAGNOSIS — Z5181 Encounter for therapeutic drug level monitoring: Secondary | ICD-10-CM | POA: Diagnosis not present

## 2016-02-07 DIAGNOSIS — E785 Hyperlipidemia, unspecified: Secondary | ICD-10-CM | POA: Diagnosis not present

## 2016-02-07 DIAGNOSIS — Z87891 Personal history of nicotine dependence: Secondary | ICD-10-CM | POA: Diagnosis not present

## 2016-02-07 DIAGNOSIS — Z7902 Long term (current) use of antithrombotics/antiplatelets: Secondary | ICD-10-CM | POA: Diagnosis not present

## 2016-02-07 DIAGNOSIS — K219 Gastro-esophageal reflux disease without esophagitis: Secondary | ICD-10-CM | POA: Diagnosis not present

## 2016-02-07 DIAGNOSIS — R5381 Other malaise: Secondary | ICD-10-CM | POA: Insufficient documentation

## 2016-02-07 DIAGNOSIS — R590 Localized enlarged lymph nodes: Secondary | ICD-10-CM | POA: Diagnosis not present

## 2016-02-07 DIAGNOSIS — R531 Weakness: Secondary | ICD-10-CM | POA: Diagnosis not present

## 2016-02-07 DIAGNOSIS — Z8673 Personal history of transient ischemic attack (TIA), and cerebral infarction without residual deficits: Secondary | ICD-10-CM | POA: Diagnosis not present

## 2016-02-07 DIAGNOSIS — Z801 Family history of malignant neoplasm of trachea, bronchus and lung: Secondary | ICD-10-CM | POA: Insufficient documentation

## 2016-02-07 DIAGNOSIS — C8331 Diffuse large B-cell lymphoma, lymph nodes of head, face, and neck: Secondary | ICD-10-CM | POA: Diagnosis not present

## 2016-02-07 HISTORY — PX: IR GENERIC HISTORICAL: IMG1180011

## 2016-02-07 LAB — CBC
HCT: 39.3 % (ref 39.0–52.0)
HEMOGLOBIN: 13.2 g/dL (ref 13.0–17.0)
MCH: 29.5 pg (ref 26.0–34.0)
MCHC: 33.6 g/dL (ref 30.0–36.0)
MCV: 87.9 fL (ref 78.0–100.0)
Platelets: 212 10*3/uL (ref 150–400)
RBC: 4.47 MIL/uL (ref 4.22–5.81)
RDW: 14.8 % (ref 11.5–15.5)
WBC: 10.4 10*3/uL (ref 4.0–10.5)

## 2016-02-07 LAB — GLUCOSE, CAPILLARY: Glucose-Capillary: 129 mg/dL — ABNORMAL HIGH (ref 65–99)

## 2016-02-07 LAB — PROTIME-INR
INR: 1.06
PROTHROMBIN TIME: 13.8 s (ref 11.4–15.2)

## 2016-02-07 LAB — APTT: aPTT: 31 seconds (ref 24–36)

## 2016-02-07 MED ORDER — MIDAZOLAM HCL 2 MG/2ML IJ SOLN
INTRAMUSCULAR | Status: AC
  Filled 2016-02-07: qty 2

## 2016-02-07 MED ORDER — LIDOCAINE HCL 1 % IJ SOLN
INTRAMUSCULAR | Status: AC
  Filled 2016-02-07: qty 20

## 2016-02-07 MED ORDER — FENTANYL CITRATE (PF) 100 MCG/2ML IJ SOLN
INTRAMUSCULAR | Status: AC
  Filled 2016-02-07: qty 2

## 2016-02-07 MED ORDER — FENTANYL CITRATE (PF) 100 MCG/2ML IJ SOLN
INTRAMUSCULAR | Status: AC | PRN
  Administered 2016-02-07: 25 ug via INTRAVENOUS

## 2016-02-07 MED ORDER — MIDAZOLAM HCL 2 MG/2ML IJ SOLN
INTRAMUSCULAR | Status: AC | PRN
  Administered 2016-02-07: 1 mg via INTRAVENOUS

## 2016-02-07 MED ORDER — SODIUM CHLORIDE 0.9 % IV SOLN: INTRAVENOUS | Status: DC

## 2016-02-07 NOTE — Sedation Documentation (Signed)
Patient denies pain and is resting comfortably.  Vitals stable.

## 2016-02-07 NOTE — Procedures (Signed)
S/p [place] BX left supraclavicular LN bx  No comp Stable Path pending Full report in PACS

## 2016-02-07 NOTE — Sedation Documentation (Signed)
Biopsy taken.

## 2016-02-07 NOTE — Discharge Instructions (Signed)
Needle Biopsy, Care After °These instructions give you information about caring for yourself after your procedure. Your doctor may also give you more specific instructions. Call your doctor if you have any problems or questions after your procedure. °HOME CARE °· Rest as told by your doctor. °· Take medicines only as told by your doctor. °· There are many different ways to close and cover the biopsy site, including stitches (sutures), skin glue, and adhesive strips. Follow instructions from your doctor about: °¨ How to take care of your biopsy site. °¨ When and how you should change your bandage (dressing). °¨ When you should remove your dressing. °¨ Removing whatever was used to close your biopsy site. °· Check your biopsy site every day for signs of infection. Watch for: °¨ Redness, swelling, or pain. °¨ Fluid, blood, or pus. °GET HELP IF: °· You have a fever. °· You have redness, swelling, or pain at the biopsy site, and it lasts longer than a few days. °· You have fluid, blood, or pus coming from the biopsy site. °· You feel sick to your stomach (nauseous). °· You throw up (vomit). °GET HELP RIGHT AWAY IF: °· You are short of breath. °· You have trouble breathing. °· Your chest hurts. °· You feel dizzy or you pass out (faint). °· You have bleeding that does not stop with pressure or a bandage. °· You cough up blood. °· Your belly (abdomen) hurts. °  °This information is not intended to replace advice given to you by your health care provider. Make sure you discuss any questions you have with your health care provider. °  °Document Released: 03/01/2008 Document Revised: 08/03/2014 Document Reviewed: 03/15/2014 °Elsevier Interactive Patient Education ©2016 Elsevier Inc. ° °

## 2016-02-07 NOTE — H&P (Signed)
Chief Complaint: lymphadenopathy  Referring Physician:Dr. Romana Juniper  Supervising Physician: Daryll Brod  Patient Status: [facility] - Out-pt  HPI: Samuel Berger is an 70 y.o. male who a little over a month ago had a UTI and thought he may have had a kidney stone.  After that he developed PNA.  Since then he has had persistent malaise and weakness.  He has lost 30lbs in the last 1-2 months.  He has minimal appetite.  He only got through 14 holes of golf a month ago and has not been able to play since then.  He underwent a CT of the abdomen/pelvis first which revealed some RTP lymphadenopathy.  He was sent to Dr. Kae Heller to assist with a diagnosis.  She ordered a CT scan, which revealed possible left-sided supraclavicular lymphadenopathy as well.  An initial request was made for a bx of the RTP node, but after review of the chest, Dr. Annamaria Boots felt like he may have a left [place] LN that would be accessible for biopsy.  He presents today for this procedure.  He does take plavix and this has been held.  Past Medical History:  Past Medical History:  Diagnosis Date  . 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 Carotid artery occlusion   . Dyslipidemia   . GERD (gastroesophageal reflux disease)   . Hepatitis    ? age 63 or 89  . Hyperlipidemia   . Hypertension   . Iliac artery aneurysm, right ([place])   . Stroke ([place])    01-05-2012   [id number]  . Type II or unspecified type diabetes mellitus without mention of complication, not stated as uncontrolled     Past Surgical History:  Past Surgical History:  Procedure Laterality Date  . basket procedure    . CAROTID ENDARTERECTOMY    . ENDARTERECTOMY  02/08/2012   Procedure: ENDARTERECTOMY CAROTID;  Surgeon: Mal Misty, MD;  Location: [place];  Service: Vascular;  Laterality: Right;  . KIDNEY STONE SURGERY     removal  . PATCH ANGIOPLASTY  02/08/2012   Procedure: PATCH ANGIOPLASTY;  Surgeon: Mal Misty, MD;  Location: [place];  Service: Vascular;   Laterality: Right;  with dacron patch angioplasty  . TEE WITHOUT CARDIOVERSION  01/08/2012   Procedure: TRANSESOPHAGEAL ECHOCARDIOGRAM (TEE);  Surgeon: Lelon Perla, MD;  Location[facility] ENDOSCOPY;  Service: Cardiovascular;  Laterality: N/A;  . TONSILLECTOMY      Family History:  Family History  Problem Relation Age of Onset  . Diabetes Mother   . Lung cancer Father     Social History:  reports that he quit smoking about 4 years ago. His smoking use included Cigars. He has a 15.00 pack-year smoking history. He has quit using smokeless tobacco. He reports that he drinks about 1.8 oz of alcohol per week . He reports that he does not use drugs.  Allergies:  Allergies  Allergen Reactions  . Bee Zee [B Complex-C-E-Zn] Hives  . Metformin And Related     dizziness  . Sulfa Drugs Cross Reactors Other (See Comments)    Unknown    Medications: Medications reviewed in Epic  Please HPI for pertinent positives, otherwise complete 10 system ROS negative.  Mallampati Score: MD Evaluation Airway: WNL Heart: WNL Abdomen: WNL Chest/ Lungs: WNL ASA  Classification: 2 Mallampati/Airway Score: Two  Physical Exam: BP 132/71   Pulse (!) 103   Temp 98.2 F (36.8 C) (Oral)   Resp 18   Ht 5' 11.5" (1.816  m)   Wt 195 lb (88.5 kg)   SpO2 97%   BMI 26.82 kg/m  Body mass index is 26.82 kg/m. General: pleasant, WD, WN white male who is laying in bed in NAD HEENT: head is normocephalic, atraumatic.  Sclera are noninjected.  PERRL.  Ears and nose without any masses or lesions.  Mouth is pink and moist Heart: regular, rate, and rhythm.  Normal s1,s2. No obvious murmurs, gallops, or rubs noted.  Palpable radial and pedal pulses bilaterally Lungs: CTAB, no wheezes, rhonchi, or rales noted.  Respiratory effort nonlabored Abd: soft, NT, ND, +BS, no masses, hernias, or organomegaly Psych: A&Ox3 with an appropriate affect.   Labs: Results for orders placed or performed during the hospital  encounter of 02/07/16 (from the past 48 hour(s))  Glucose, capillary     Status: Abnormal   Collection Time: 02/07/16  9:57 AM  Result Value Ref Range   Glucose-Capillary 129 (H) 65 - 99 mg/dL  APTT upon arrival     Status: None   Collection Time: 02/07/16 10:08 AM  Result Value Ref Range   aPTT 31 24 - 36 seconds  CBC upon arrival     Status: None   Collection Time: 02/07/16 10:08 AM  Result Value Ref Range   WBC 10.4 4.0 - 10.5 K/uL   RBC 4.47 4.22 - 5.81 MIL/uL   Hemoglobin 13.2 13.0 - 17.0 g/dL   HCT 39.3 39.0 - 52.0 %   MCV 87.9 78.0 - 100.0 fL   MCH 29.5 26.0 - 34.0 pg   MCHC 33.6 30.0 - 36.0 g/dL   RDW 14.8 11.5 - 15.5 %   Platelets 212 150 - 400 K/uL  Protime-INR upon arrival     Status: None   Collection Time: 02/07/16 10:08 AM  Result Value Ref Range   Prothrombin Time 13.8 11.4 - 15.2 seconds   INR 1.06     Imaging: No results found.  Assessment/Plan 1. Diffuse lymphadenopathy -we will plan to look at the [facility] node under [place] first.  If this is amenable to bx then we will proceed under US guidance.  If this is not amendable then he will go to CT where we will proceed with a GT-guided RTP LN BX.   -vitals and labs reviewed -Risks and Benefits discussed with the patient including, but not limited to bleeding, infection, damage to adjacent structures or low yield requiring additional tests. All of the patient's questions were answered, patient is agreeable to proceed. Consent signed and in chart.  Thank you for this interesting consult.  I greatly enjoyed meeting AITHAN BACCAM and look forward to participating in their care.  A copy of this report was sent to the requesting provider on this date.  Electronically Signed: Henreitta Cea 02/07/2016, 11:02 AM   I spent a total of  30 Minutes   in face to face in clinical consultation, greater than 50% of which was counseling/coordinating care for lymphadenopathy

## 2016-02-07 NOTE — Sedation Documentation (Signed)
Patient is resting comfortably. No complaints at this time 

## 2016-02-12 ENCOUNTER — Other Ambulatory Visit: Payer: Self-pay | Admitting: Hematology and Oncology

## 2016-02-12 ENCOUNTER — Inpatient Hospital Stay (HOSPITAL_COMMUNITY)
Admission: EM | Admit: 2016-02-12 | Discharge: 2016-02-21 | DRG: 840 | Disposition: A | Discharge status: Home / Self Care | Payer: Medicare Other | Attending: Hematology and Oncology | Admitting: Hematology and Oncology

## 2016-02-12 ENCOUNTER — Emergency Department (HOSPITAL_COMMUNITY): Payer: Medicare Other

## 2016-02-12 ENCOUNTER — Encounter (HOSPITAL_COMMUNITY): Payer: Self-pay | Admitting: Emergency Medicine

## 2016-02-12 DIAGNOSIS — C8339 Diffuse large B-cell lymphoma, extranodal and solid organ sites: Secondary | ICD-10-CM | POA: Diagnosis not present

## 2016-02-12 DIAGNOSIS — R61 Generalized hyperhidrosis: Secondary | ICD-10-CM | POA: Diagnosis present

## 2016-02-12 DIAGNOSIS — E43 Unspecified severe protein-calorie malnutrition: Secondary | ICD-10-CM | POA: Diagnosis present

## 2016-02-12 DIAGNOSIS — R531 Weakness: Secondary | ICD-10-CM | POA: Diagnosis not present

## 2016-02-12 DIAGNOSIS — E1151 Type 2 diabetes mellitus with diabetic peripheral angiopathy without gangrene: Secondary | ICD-10-CM | POA: Diagnosis present

## 2016-02-12 DIAGNOSIS — K5904 Chronic idiopathic constipation: Secondary | ICD-10-CM

## 2016-02-12 DIAGNOSIS — I959 Hypotension, unspecified: Secondary | ICD-10-CM | POA: Diagnosis not present

## 2016-02-12 DIAGNOSIS — C8510 Unspecified B-cell lymphoma, unspecified site: Secondary | ICD-10-CM | POA: Diagnosis not present

## 2016-02-12 DIAGNOSIS — Z882 Allergy status to sulfonamides status: Secondary | ICD-10-CM

## 2016-02-12 DIAGNOSIS — R6510 Systemic inflammatory response syndrome (SIRS) of non-infectious origin without acute organ dysfunction: Secondary | ICD-10-CM | POA: Diagnosis not present

## 2016-02-12 DIAGNOSIS — N10 Acute tubulo-interstitial nephritis: Secondary | ICD-10-CM

## 2016-02-12 DIAGNOSIS — J449 Chronic obstructive pulmonary disease, unspecified: Secondary | ICD-10-CM | POA: Diagnosis present

## 2016-02-12 DIAGNOSIS — Z6827 Body mass index (BMI) 27.0-27.9, adult: Secondary | ICD-10-CM

## 2016-02-12 DIAGNOSIS — R55 Syncope and collapse: Secondary | ICD-10-CM | POA: Diagnosis present

## 2016-02-12 DIAGNOSIS — R110 Nausea: Secondary | ICD-10-CM | POA: Diagnosis not present

## 2016-02-12 DIAGNOSIS — E785 Hyperlipidemia, unspecified: Secondary | ICD-10-CM | POA: Diagnosis present

## 2016-02-12 DIAGNOSIS — E860 Dehydration: Secondary | ICD-10-CM | POA: Diagnosis not present

## 2016-02-12 DIAGNOSIS — Z87891 Personal history of nicotine dependence: Secondary | ICD-10-CM

## 2016-02-12 DIAGNOSIS — R509 Fever, unspecified: Secondary | ICD-10-CM | POA: Diagnosis not present

## 2016-02-12 DIAGNOSIS — R634 Abnormal weight loss: Secondary | ICD-10-CM

## 2016-02-12 DIAGNOSIS — E46 Unspecified protein-calorie malnutrition: Secondary | ICD-10-CM | POA: Diagnosis present

## 2016-02-12 DIAGNOSIS — Z91030 Bee allergy status: Secondary | ICD-10-CM

## 2016-02-12 DIAGNOSIS — C8338 Diffuse large B-cell lymphoma, lymph nodes of multiple sites: Secondary | ICD-10-CM

## 2016-02-12 DIAGNOSIS — K219 Gastro-esophageal reflux disease without esophagitis: Secondary | ICD-10-CM | POA: Diagnosis present

## 2016-02-12 DIAGNOSIS — K5909 Other constipation: Secondary | ICD-10-CM

## 2016-02-12 DIAGNOSIS — E876 Hypokalemia: Secondary | ICD-10-CM | POA: Diagnosis not present

## 2016-02-12 DIAGNOSIS — I723 Aneurysm of iliac artery: Secondary | ICD-10-CM

## 2016-02-12 DIAGNOSIS — Z79899 Other long term (current) drug therapy: Secondary | ICD-10-CM

## 2016-02-12 DIAGNOSIS — Z888 Allergy status to other drugs, medicaments and biological substances status: Secondary | ICD-10-CM

## 2016-02-12 DIAGNOSIS — E119 Type 2 diabetes mellitus without complications: Secondary | ICD-10-CM

## 2016-02-12 DIAGNOSIS — Z8572 Personal history of non-Hodgkin lymphomas: Secondary | ICD-10-CM

## 2016-02-12 DIAGNOSIS — K5900 Constipation, unspecified: Secondary | ICD-10-CM | POA: Diagnosis present

## 2016-02-12 DIAGNOSIS — E871 Hypo-osmolality and hyponatremia: Secondary | ICD-10-CM | POA: Diagnosis present

## 2016-02-12 DIAGNOSIS — Z8673 Personal history of transient ischemic attack (TIA), and cerebral infarction without residual deficits: Secondary | ICD-10-CM

## 2016-02-12 DIAGNOSIS — C8330 Diffuse large B-cell lymphoma, unspecified site: Principal | ICD-10-CM | POA: Diagnosis present

## 2016-02-12 DIAGNOSIS — I10 Essential (primary) hypertension: Secondary | ICD-10-CM

## 2016-02-12 DIAGNOSIS — R404 Transient alteration of awareness: Secondary | ICD-10-CM | POA: Diagnosis not present

## 2016-02-12 DIAGNOSIS — N132 Hydronephrosis with renal and ureteral calculous obstruction: Secondary | ICD-10-CM | POA: Diagnosis present

## 2016-02-12 DIAGNOSIS — Z7902 Long term (current) use of antithrombotics/antiplatelets: Secondary | ICD-10-CM

## 2016-02-12 LAB — COMPREHENSIVE METABOLIC PANEL
ALBUMIN: 3.4 g/dL — AB (ref 3.5–5.0)
ALK PHOS: 52 U/L (ref 38–126)
ALT: 16 U/L — ABNORMAL LOW (ref 17–63)
AST: 20 U/L (ref 15–41)
Anion gap: 9 (ref 5–15)
BILIRUBIN TOTAL: 0.7 mg/dL (ref 0.3–1.2)
BUN: 14 mg/dL (ref 6–20)
CO2: 26 mmol/L (ref 22–32)
Calcium: 8.9 mg/dL (ref 8.9–10.3)
Chloride: 97 mmol/L — ABNORMAL LOW (ref 101–111)
Creatinine, Ser: 0.88 mg/dL (ref 0.61–1.24)
GFR calc Af Amer: >60 mL/min (ref >60)
GFR calc non Af Amer: >60 mL/min (ref >60)
GLUCOSE: 147 mg/dL — AB (ref 65–99)
POTASSIUM: 4.4 mmol/L (ref 3.5–5.1)
SODIUM: 132 mmol/L — AB (ref 135–145)
TOTAL PROTEIN: 7.6 g/dL (ref 6.5–8.1)

## 2016-02-12 LAB — CBC WITH DIFFERENTIAL/PLATELET
BASOS ABS: 0 10*3/uL (ref 0.0–0.1)
Basophils Relative: 0 %
EOS PCT: 1 %
Eosinophils Absolute: 0.1 10*3/uL (ref 0.0–0.7)
HEMATOCRIT: 39.9 % (ref 39.0–52.0)
Hemoglobin: 13.3 g/dL (ref 13.0–17.0)
LYMPHS PCT: 7 %
Lymphs Abs: 1 10*3/uL (ref 0.7–4.0)
MCH: 29.4 pg (ref 26.0–34.0)
MCHC: 33.3 g/dL (ref 30.0–36.0)
MCV: 88.3 fL (ref 78.0–100.0)
MONO ABS: 3 10*3/uL — AB (ref 0.1–1.0)
MONOS PCT: 20 %
Neutro Abs: 11 10*3/uL — ABNORMAL HIGH (ref 1.7–7.7)
Neutrophils Relative %: 72 %
PLATELETS: 234 10*3/uL (ref 150–400)
RBC: 4.52 MIL/uL (ref 4.22–5.81)
RDW: 14.9 % (ref 11.5–15.5)
WBC: 15.1 10*3/uL — ABNORMAL HIGH (ref 4.0–10.5)

## 2016-02-12 LAB — URINALYSIS, ROUTINE W REFLEX MICROSCOPIC
BILIRUBIN URINE: NEGATIVE
GLUCOSE, UA: NEGATIVE mg/dL
HGB URINE DIPSTICK: NEGATIVE
KETONES UR: NEGATIVE mg/dL
Leukocytes, UA: NEGATIVE
Nitrite: NEGATIVE
PH: 7 (ref 5.0–8.0)
PROTEIN: NEGATIVE mg/dL
Specific Gravity, Urine: 1.023 (ref 1.005–1.030)

## 2016-02-12 LAB — I-STAT CG4 LACTIC ACID, ED: Lactic Acid, Venous: 1.85 mmol/L (ref 0.5–1.9)

## 2016-02-12 LAB — GLUCOSE, CAPILLARY: GLUCOSE-CAPILLARY: 123 mg/dL — AB (ref 65–99)

## 2016-02-12 MED ORDER — SENNOSIDES-DOCUSATE SODIUM 8.6-50 MG PO TABS
2.0000 | ORAL_TABLET | Freq: Three times a day (TID) | ORAL | Status: DC
  Administered 2016-02-12 (×2): 2 via ORAL
  Filled 2016-02-12 (×3): qty 2

## 2016-02-12 MED ORDER — ACETAMINOPHEN 500 MG PO TABS
500.0000 mg | ORAL_TABLET | Freq: Four times a day (QID) | ORAL | Status: DC | PRN
  Administered 2016-02-12 – 2016-02-13 (×3): 500 mg via ORAL
  Filled 2016-02-12 (×3): qty 1

## 2016-02-12 MED ORDER — SODIUM CHLORIDE 0.9 % IV BOLUS (SEPSIS)
1000.0000 mL | Freq: Once | INTRAVENOUS | Status: AC
  Administered 2016-02-12: 1000 mL via INTRAVENOUS

## 2016-02-12 MED ORDER — SODIUM CHLORIDE 0.9 % IV SOLN: INTRAVENOUS | Status: DC

## 2016-02-12 MED ORDER — ENSURE ENLIVE PO LIQD
237.0000 mL | Freq: Two times a day (BID) | ORAL | Status: DC
  Administered 2016-02-12 – 2016-02-17 (×6): 237 mL via ORAL

## 2016-02-12 MED ORDER — ASPIRIN EC 81 MG PO TBEC
81.0000 mg | DELAYED_RELEASE_TABLET | Freq: Every day | ORAL | Status: DC
  Administered 2016-02-12 – 2016-02-21 (×10): 81 mg via ORAL
  Filled 2016-02-12 (×10): qty 1

## 2016-02-12 MED ORDER — ACETAMINOPHEN 325 MG PO TABS
650.0000 mg | ORAL_TABLET | Freq: Once | ORAL | Status: AC
  Administered 2016-02-12: 650 mg via ORAL
  Filled 2016-02-12: qty 2

## 2016-02-12 MED ORDER — SODIUM CHLORIDE 0.9 % IV BOLUS (SEPSIS)
500.0000 mL | Freq: Once | INTRAVENOUS | Status: AC
  Administered 2016-02-12: 500 mL via INTRAVENOUS

## 2016-02-12 MED ORDER — SODIUM CHLORIDE 0.9 % IV BOLUS (SEPSIS)
1000.0000 mL | Freq: Once | INTRAVENOUS | Status: AC
  Administered 2016-02-12 (×2): 1000 mL via INTRAVENOUS

## 2016-02-12 MED ORDER — SODIUM CHLORIDE 0.9 % IV SOLN
INTRAVENOUS | Status: DC
  Administered 2016-02-12 – 2016-02-14 (×6): via INTRAVENOUS
  Administered 2016-02-15: 1000 mL via INTRAVENOUS

## 2016-02-12 MED ORDER — VANCOMYCIN HCL IN DEXTROSE 1-5 GM/200ML-% IV SOLN
1000.0000 mg | Freq: Once | INTRAVENOUS | Status: AC
  Administered 2016-02-12: 1000 mg via INTRAVENOUS
  Filled 2016-02-12: qty 200

## 2016-02-12 MED ORDER — SODIUM CHLORIDE 0.9 % IV BOLUS (SEPSIS)
1000.0000 mL | Freq: Once | INTRAVENOUS | Status: AC
Start: 2016-02-12 — End: 2016-02-12
  Administered 2016-02-12: 1000 mL via INTRAVENOUS

## 2016-02-12 MED ORDER — CLOPIDOGREL BISULFATE 75 MG PO TABS: 75.0000 mg | ORAL_TABLET | Freq: Every day | ORAL | Status: DC

## 2016-02-12 MED ORDER — PIPERACILLIN-TAZOBACTAM 3.375 G IVPB 30 MIN
3.3750 g | Freq: Once | INTRAVENOUS | Status: AC
  Administered 2016-02-12: 3.375 g via INTRAVENOUS
  Filled 2016-02-12: qty 50

## 2016-02-12 MED ORDER — ATORVASTATIN CALCIUM 40 MG PO TABS
40.0000 mg | ORAL_TABLET | Freq: Every day | ORAL | Status: DC
  Administered 2016-02-12 – 2016-02-20 (×9): 40 mg via ORAL
  Filled 2016-02-12 (×9): qty 1

## 2016-02-12 MED ORDER — SODIUM CHLORIDE 0.9% FLUSH
3.0000 mL | Freq: Two times a day (BID) | INTRAVENOUS | Status: DC
  Administered 2016-02-17 – 2016-02-20 (×7): 3 mL via INTRAVENOUS

## 2016-02-12 MED ORDER — ENOXAPARIN SODIUM 40 MG/0.4ML ~~LOC~~ SOLN
40.0000 mg | SUBCUTANEOUS | Status: DC
  Administered 2016-02-12 – 2016-02-20 (×8): 40 mg via SUBCUTANEOUS
  Filled 2016-02-12 (×8): qty 0.4

## 2016-02-12 NOTE — H&P (Signed)
[facility] History and Physical  SKYLLER CUSHING [id number] DOB: Jan 18, 1946 DOA: 02/12/2016  Referring physician: ED PCP: Wenda Low, MD  Specialists: none  Chief Complaint:   HPI:  70 y/o ? gerd htn  pvd Grad e1 diastolic dysfucnt echo [id number] ef 60-65% Prior CVA and prior endarterectomy on the right side COPD with prior TOb abuse hld DM ty II  Recent diagnosis after 20 lb weight loss of supracalv mass-?lymphoma-B cell type on pathology done from Supraclav node 11.7.1  not on any current Rx presently  Presented with episodic syncope at home while doing routine work around the house no chest pain no shortness of breath no fever no antecedent chills He does still smoke and occasionally drinks but no other issues This is not happening before He has been weak however overall for the past 6 weeks   sys bp was in the 80's initially Tachy 100-120's  Started on vanc/zosyn in ED Was given septic picture  Sodium 132 Bun /creat was 14/0.88 LFT wnl WBc 15 Lactic acid 1.85   Review of Systems: Patient denies any other findings or symptoms Past Medical History:  Diagnosis Date  . 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 Carotid artery occlusion   . Dyslipidemia   . GERD (gastroesophageal reflux disease)   . Hepatitis    ? age 73 or 75  . Hyperlipidemia   . Hypertension   . Iliac artery aneurysm, right ([place])   . Stroke ([place])    01-05-2012   [id number]  . Type II or unspecified type diabetes mellitus without mention of complication, not stated as uncontrolled    Past Surgical History:  Procedure Laterality Date  . basket procedure    . CAROTID ENDARTERECTOMY    . ENDARTERECTOMY  02/08/2012   Procedure: ENDARTERECTOMY CAROTID;  Surgeon: Mal Misty, MD;  Location: [place];  Service: Vascular;  Laterality: Right;  . IR GENERIC HISTORICAL  02/07/2016   IR US GUIDE BX ASP/DRAIN 02/07/2016 Greggory Keen, MD [name]  . KIDNEY STONE SURGERY     removal  . PATCH  ANGIOPLASTY  02/08/2012   Procedure: PATCH ANGIOPLASTY;  Surgeon: Mal Misty, MD;  Location: [place];  Service: Vascular;  Laterality: Right;  with dacron patch angioplasty  . TEE WITHOUT CARDIOVERSION  01/08/2012   Procedure: TRANSESOPHAGEAL ECHOCARDIOGRAM (TEE);  Surgeon: Lelon Perla, MD;  Location: [facility] ENDOSCOPY;  Service: Cardiovascular;  Laterality: N/A;  . TONSILLECTOMY     Social History:  Social History   Social History Narrative  . No narrative on file    Allergies  Allergen Reactions  . Bee Zee [B Complex-C-E-Zn] Hives  . Metformin And Related     dizziness  . Sulfa Drugs Cross Reactors Other (See Comments)    Unknown    Family History  Problem Relation Age of Onset  . Diabetes Mother   . Lung cancer Father     Prior to Admission medications   Medication Sig Start Date End Date Taking? Authorizing Provider  amLODipine (NORVASC) 10 MG tablet Take 10 mg by mouth daily.   Yes Historical Provider, MD  atorvastatin (LIPITOR) 40 MG tablet Take 40 mg by mouth daily at 6 PM. 01/08/12  Yes Abelina Bachelor, PA-C  clopidogrel (PLAVIX) 75 MG tablet Take 75 mg by mouth daily with breakfast. 01/08/12  Yes Abelina Bachelor, PA-C  lisinopril (PRINIVIL,ZESTRIL) 5 MG tablet Take 5 mg by mouth daily.   Yes Historical Provider, MD  pioglitazone (ACTOS) 30 MG  tablet Take 30 mg by mouth daily.   Yes Historical Provider, MD   Physical Exam: Vitals:   02/12/16 1051 02/12/16 1052 02/12/16 1100 02/12/16 1230  BP:  140/64 136/62 119/58  Pulse:  108 107 100  Resp:  22 (!) 31 (!) 30  Temp:  100.8 F (38.2 C)    TempSrc:  Oral    SpO2: 97% 98% 97% 96%   Alert oriented pleasant no apparent distress, S1-S2 no murmur rub or gallop, notable left supraclavicular fullness noted, chest clinically clear no added sound, carries in right upper third molar, abdomen soft nontender nondistended no rebound or guarding, finger-nose-finger test within normal limits, power 5/5 bilaterally, no rash no lower  extremity edema   Labs on Admission:  Basic Metabolic Panel:  Recent Labs Lab 02/12/16 1120  NA 132*  K 4.4  CL 97*  CO2 26  GLUCOSE 147*  BUN 14  CREATININE 0.88  CALCIUM 8.9   Liver Function Tests:  Recent Labs Lab 02/12/16 1120  AST 20  ALT 16*  ALKPHOS 52  BILITOT 0.7  PROT 7.6  ALBUMIN 3.4*   No results for input(s): LIPASE, AMYLASE in the last 168 hours. No results for input(s): AMMONIA in the last 168 hours. CBC:  Recent Labs Lab 02/07/16 1008 02/12/16 1120  WBC 10.4 15.1*  NEUTROABS  --  11.0*  HGB 13.2 13.3  HCT 39.3 39.9  MCV 87.9 88.3  PLT 212 234   Cardiac Enzymes: No results for input(s): CKTOTAL, CKMB, CKMBINDEX, TROPONINI in the last 168 hours.  BNP (last 3 results) No results for input(s): BNP in the last 8760 hours.  ProBNP (last 3 results) No results for input(s): PROBNP in the last 8760 hours.  CBG:  Recent Labs Lab 02/07/16 0957  GLUCAP 129*    Radiological Exams on Admission: Dg Chest 2 View  Result Date: 02/12/2016 CLINICAL DATA:  Fever and weakness. EXAM: CHEST  2 VIEW COMPARISON:  01/27/2016 FINDINGS: The heart size and mediastinal contours are within normal limits. Both lungs are clear. The visualized skeletal structures are unremarkable. IMPRESSION: No active cardiopulmonary disease. Electronically Signed   By: Kerby Moors M.D.   On: 02/12/2016 11:52    EKG: Independently reviewed. 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

## 2016-02-12 NOTE — Progress Notes (Signed)
Patient's vitals were: 102.22F;HR 111;RR22;167/67. PCP on call was notified

## 2016-02-12 NOTE — Consult Note (Signed)
[place] NOTE  Patient Care Team: Wenda Low, MD as PCP - General (Internal Medicine)  CHIEF COMPLAINTS/PURPOSE OF CONSULTATION:  Newly diagnosed DLBCL  HISTORY OF PRESENTING ILLNESS:  Samuel Berger 70 y.o. male is here being admitted due to profound weakness, dehydration and recent weight loss, likely related to recent diagnosis of lymphoma  This patient had multiple cardiac risk factors including prior history of stroke, iliac artery aneurysm, diabetes, hypertension and hyperlipidemia He started to have progressive decline in performance status with weakness, anorexia, 30 pound weight loss and profound fatigue. His wife recently requested CT imaging study to look for kidney stone when he complained of some vague flank/lower abdominal discomfort. On 01/06/2016, CT scan of the abdomen and pelvis revealed extensive lymphadenopathy, concern for undiagnosed lymphoma. Interestingly, he had CT angiogram performed in December 2016 which shows similar findings with recommendations from radiologist to consult hematologist but he was never informed of the abnormal angiogram study and oncologist was not consulted Subsequently, his doctor proceed to order CT scan of the chest, performed on 01/27/2016 which again also showed evidence of lymphadenopathy in the left base of the neck. He was recommended biopsy. On 02/07/2016, left supraclavicular lymph node biopsy confirmed diagnosis of diffuse large B-cell lymphoma. Ki-67 score was high at 95%. This morning, the patient had near syncopal episode with weakness. He was brought to the emergency department for further evaluation. In the emergency department, he was noted to have mild hyponatremia with evidence of leukocytosis. He was tachycardic with low-grade fever. He is being admitted for further evaluation and management He admits he had progressive anorexia for many months with 30 pound weight loss. He stopped playing golf  several months ago and is completely bedbound over the last few weeks, spent over 20 hours lying in bed cause of this. With poor oral intake, he is also constipated. He has no bowel movement for last 3 days He has occasional night sweats at home He denies nausea or pain. He has shortness of breath on minimal exertion. Of note, his last echocardiogram on 01/31/2016 show preserved ejection fraction He denies recent headache. He has complete recovery from prior history of stroke. He denies neurological deficit    MEDICAL HISTORY:  Past Medical History:  Diagnosis Date  . Aneurysm artery, iliac ([place])   . Carotid artery occlusion   . Dyslipidemia   . GERD (gastroesophageal reflux disease)   . Hepatitis    ? age 70 or 55  . Hyperlipidemia   . Hypertension   . Iliac artery aneurysm, right ([place])   . Stroke ([place])    01-05-2012   [id number]  . Type II or unspecified type diabetes mellitus without mention of complication, not stated as uncontrolled     SURGICAL HISTORY: Past Surgical History:  Procedure Laterality Date  . basket procedure    . CAROTID ENDARTERECTOMY    . ENDARTERECTOMY  02/08/2012   Procedure: ENDARTERECTOMY CAROTID;  Surgeon: Mal Misty, MD;  Location: [facility];  Service: Vascular;  Laterality: Right;  . IR GENERIC HISTORICAL  02/07/2016   IR US GUIDE BX ASP/DRAIN 02/07/2016 Greggory Keen, MD [name]  . KIDNEY STONE SURGERY     removal  . PATCH ANGIOPLASTY  02/08/2012   Procedure: PATCH ANGIOPLASTY;  Surgeon: Mal Misty, MD;  Location: [facility];  Service: Vascular;  Laterality: Right;  with dacron patch angioplasty  . TEE WITHOUT CARDIOVERSION  01/08/2012   Procedure: TRANSESOPHAGEAL ECHOCARDIOGRAM (TEE);  Surgeon: Denice Bors  Stanford Breed, MD;  Location: [facility];  Service: Cardiovascular;  Laterality: N/A;  . TONSILLECTOMY      SOCIAL HISTORY: Social History   Social History  . Marital status: Married    Spouse name: anne  . Number of children: 2  . Years of  education: college   Occupational History  . retired    Social History Main Topics  . Smoking status: Former Smoker    Packs/day: 0.50    Years: 30.00    Types: Cigars    Quit date: 01/05/2012  . Smokeless tobacco: Former Systems developer  . Alcohol use 1.8 oz/week    1 Glasses of wine, 1 Cans of beer, 1 Shots of liquor per week     Comment: quit 2 months..Patient drinks coffee daily.OCCASIONAL DRINKER  . Drug use: No  . Sexual activity: Not on file   Other Topics Concern  . Not on file   Social History Narrative  . No narrative on file    FAMILY HISTORY: Family History  Problem Relation Age of Onset  . Diabetes Mother   . Lung cancer Father     ALLERGIES:  is allergic to bee zee [b [id number]-e-zn]; metformin and related; and sulfa drugs cross reactors.  MEDICATIONS:  Current Facility-Administered Medications  Medication Dose Route Frequency Provider Last Rate Last Dose  . senna-docusate (Senokot-S) tablet 2 tablet  2 tablet Oral TID Heath Lark, MD       Current Outpatient Prescriptions  Medication Sig Dispense Refill  . amLODipine (NORVASC) 10 MG tablet Take 10 mg by mouth daily.    Marland Kitchen atorvastatin (LIPITOR) 40 MG tablet Take 40 mg by mouth daily at 6 PM.    . clopidogrel (PLAVIX) 75 MG tablet Take 75 mg by mouth daily with breakfast.    . lisinopril (PRINIVIL,ZESTRIL) 5 MG tablet Take 5 mg by mouth daily.    . pioglitazone (ACTOS) 30 MG tablet Take 30 mg by mouth daily.      REVIEW OF SYSTEMS:   Constitutional: Denies fevers, chills  Eyes: Denies blurriness of vision, double vision or watery eyes Ears, nose, mouth, throat, and face: Denies mucositis or sore throat Cardiovascular: Denies palpitation, chest discomfort or lower extremity swelling Skin: Denies abnormal skin rashes Behavioral/Psych: Mood is stable, no new changes  All other systems were reviewed with the patient and are negative.  PHYSICAL EXAMINATION: ECOG PERFORMANCE STATUS: 3 - Symptomatic, >50% confined  to bed  Vitals:   02/12/16 1430 02/12/16 1500  BP: 146/72 137/88  Pulse: 94 97  Resp: 20 (!) 31  Temp:     There were no vitals filed for this visit.  GENERAL:alert, no distress and comfortable SKIN: skin color, texture, turgor are normal, no rashes or significant lesions EYES: normal, conjunctiva are pink and non-injected, sclera clear OROPHARYNX:no exudate, no erythema and lips, buccal mucosa, and tongue normal  NECK: supple, thyroid normal size, non-tender, without nodularity LYMPH:  He has palpable lymphadenopathy in the left supraclavicular region with minor bruising from recent biopsy LUNGS: clear to auscultation and percussion with normal breathing effort HEART: regular rate & rhythm and no murmurs and no lower extremity edema ABDOMEN:abdomen soft, non-tender and normal bowel sounds Musculoskeletal:no cyanosis of digits and no clubbing  PSYCH: alert & oriented x 3 with fluent speech NEURO: no focal motor/sensory deficits  LABORATORY DATA:  I have reviewed the data as listed Lab Results  Component Value Date   WBC 15.1 (H) 02/12/2016   HGB 13.3 02/12/2016   HCT 39.9  02/12/2016   MCV 88.3 02/12/2016   PLT 234 02/12/2016    Recent Labs  03/01/15 1008 02/12/16 1120  NA  --  132*  K  --  4.4  CL  --  97*  CO2  --  26  GLUCOSE  --  147*  BUN 19 14  CREATININE 0.86 0.88  CALCIUM  --  8.9  GFRNONAA  --  >60  GFRAA  --  >60  PROT  --  7.6  ALBUMIN  --  3.4*  AST  --  20  ALT  --  16*  ALKPHOS  --  52  BILITOT  --  0.7    RADIOGRAPHIC STUDIES: I have personally reviewed the radiological images as listed and agreed with the findings in the report. Dg Chest 2 View  Result Date: 02/12/2016 CLINICAL DATA:  Fever and weakness. EXAM: CHEST  2 VIEW COMPARISON:  01/27/2016 FINDINGS: The heart size and mediastinal contours are within normal limits. Both lungs are clear. The visualized skeletal structures are unremarkable. IMPRESSION: No active cardiopulmonary  disease. Electronically Signed   By: Kerby Moors M.D.   On: 02/12/2016 11:52   Ct Chest W Contrast  Result Date: 01/27/2016 CLINICAL DATA:  Patient recently treated for pneumonia. Weakness and fatigue over the past month. EXAM: CT CHEST WITH CONTRAST TECHNIQUE: Multidetector CT imaging of the chest was performed during intravenous contrast administration. CONTRAST:  3m ISOVUE-300 IOPAMIDOL (ISOVUE-300) INJECTION 61% COMPARISON:  CT abdomen/ pelvis 01/06/2016 and 03/07/2015 FINDINGS: Cardiovascular: Heart hole in size. Calcified plaque is present over the left anterior descending, lateral circumflex and right coronary arteries. Mild calcified plaque over the thoracic aorta. Pulmonary arterial system is unremarkable. Mediastinum/Nodes: No evidence of mediastinal or hilar adenopathy. Remaining mediastinal structures are within normal. Few small hypodense nodules of the thyroid with the largest measuring 1 point 2 cm over the left lobe. Oval density over the left neck base at the level of the thyroid measuring 2.5 x 4.5 cm likely due in part to prominent left jugular vein although cannot exclude adjacent adenopathy. Lungs/Pleura: Lungs are adequately inflated without focal consolidation or effusion. 2 mm subpleural nodule over the lateral right upper lobe. Airways are within normal. Upper Abdomen: Minimal cholelithiasis. 2.4 cm cyst over the upper pole left kidney. Sub cm hypodensity over the mid pole cortex right kidney too small to characterize but likely a cyst and unchanged. Aortic atherosclerosis. Retrocrural and periaortic adenopathy unchanged. Musculoskeletal: Mild degenerate change of the spine. IMPRESSION: No acute cardiopulmonary disease. Known stable retrocrural and periaortic adenopathy. Oval density over the left neck base measuring 2.5 x 4.5 cm likely due partially to prominent internal jugular vein, although cannot exclude adenopathy in this region. Recommend neck CT with contrast. Three vessel  atherosclerotic coronary artery disease. Few small thyroid nodules with the largest measuring 1.2 cm over the left lobe. 2.4 cm left renal cyst. Sub cm right renal hypodensity unchanged and too small to characterize but likely a cyst. Minimal cholelithiasis. Electronically Signed   By: DMarin OlpM.D.   On: 01/27/2016 16:33   Ir UK[place]Guide Bx Asp/drain  Result Date: 02/07/2016 INDICATION: Left supraclavicular and retroperitoneal adenopathy, concern for lymphoproliferative process. EXAM: IR ULTRASOUND GUIDANCE MEDICATIONS: 1 mg Versed, 25 mcg fentanyl ANESTHESIA/SEDATION: Moderate (conscious) sedation was employed during this procedure. A total of Versed 1.0 mg and Fentanyl 25 mcg was administered intravenously. Moderate Sedation Time: 11 minutes. The patient's level of consciousness and vital signs were monitored continuously by radiology nursing throughout  the procedure under my direct supervision. FLUOROSCOPY TIME:  Fluoroscopy Time: None. COMPLICATIONS: None immediate. PROCEDURE: Informed written consent was obtained from the patient after a thorough discussion of the procedural risks, benefits and alternatives. All questions were addressed. Maximal Sterile Barrier Technique was utilized including caps, mask, sterile gowns, sterile gloves, sterile drape, hand hygiene and skin antiseptic. A timeout was performed prior to the initiation of the procedure. Previous imaging reviewed. Preliminary ultrasound performed. The left supraclavicular nodal mass was localized. Overlying skin marked. Under sterile conditions and local anesthesia, an 18 gauge core biopsy was advanced under direct ultrasound into the left supraclavicular nodal mass. Several 18 gauge core biopsies obtained under direct ultrasound. Images obtained for documentation. Samples placed in saline. Needle removed. Postprocedure imaging demonstrates no large hematoma. Patient tolerated the biopsy well. No immediate complication. IMPRESSION: Successful  ultrasound left supraclavicular nodal mass 18 gauge core biopsies Electronically Signed   By: Jerilynn Mages.  Shick M.D.   On: 02/07/2016 12:25    ASSESSMENT & PLAN:   Diffuse large B cell lymphoma, at least stage III Significant B symptoms with anorexia, weight loss and night sweats He needs urgent inpatient work-up. I recommend he stops Plavix in anticipation for port placement. The patient has poor venous access. We can try to get port placement at the end of the week (potentially as outpatient) along with bone marrow biopsy to complete his staging I will get pathologist who ordered [place] analysis to exclude triple Hit Lymphoma, given high Ki-67 score Will get his case presented at the next Hematology tumor board We discussed briefly about the approach for diffuse large B-cell lymphoma including the plan for urgent chemotherapy soon I would prefer to get everything done over next few days with plan to start him on treatment early next week I will order additional workup including uric acid, LDH and other tests  Anorexia with weight loss Hyponatremia due to poor oral intake I will consult dietitian for review. Recommended frequent small meals Recommend IV fluids  Chronic constipation We discussed management with laxative, increase fiber intake and hydration  History of stroke without residual deficit I recommend stopping Plavix and substitute with 81 mg aspirin. With his recent near-syncopal episode, I suspect is due to hypotension due to poor oral intake and dehydration. I will stop lisinopril as well  Weakness with near syncopal episode  Consult PT for safety evaluation   Discharge planning  Hopefully within the next 48 hours    All questions were answered. The patient knows to call the clinic with any problems, questions or concerns.    Heath Lark, MD 02/12/2016 3:22 PM

## 2016-02-12 NOTE — ED Provider Notes (Signed)
[facility] DEPT Provider Note   CSN: [id number] Arrival date & time: 02/12/16  1048     History   Chief Complaint Chief Complaint  Patient presents with  . Near Syncope    HPI Samuel Berger is a 70 y.o. male.  HPI 70 year old male who presents with near syncope. He has a recent diagnosis of large B-cell lymphoma a few days ago and has not yet seen an oncologist regarding management. He also has a history of hypertension, hyperlipidemia, prior CVA, and diabetes. States that over the past several months leading up to his diagnosis of lymphoma he has had decreased appetite, night sweats, significant weight loss. States that yesterday he felt extremely weak and fatigued. Today while in the kitchen, his wife states that he leaned over, and looks like he was about to pass out. Did not have any true syncope, but she called EMS. He is not aware of having any fevers, cough, shortness of breath, chest pain, vomiting or diarrhea, abdominal pain, rash, dysuria or urinary frequency.  On EMS arrival, he was noted to be orthostatic with systolic blood pressures in the 80s upon standing.  Past Medical History:  Diagnosis Date  . Aneurysm artery, iliac ([place])   . Carotid artery occlusion   . Dyslipidemia   . GERD (gastroesophageal reflux disease)   . Hepatitis    ? age 63 or 21  . Hyperlipidemia   . Hypertension   . Iliac artery aneurysm, right ([place])   . Stroke ([place])    01-05-2012   [id number]  . Type II or unspecified type diabetes mellitus without mention of complication, not stated as uncontrolled     Patient Active Problem List   Diagnosis Date Noted  . Large B-cell lymphoma ([place]) 02/12/2016  . Syncope 02/12/2016  . Other generalized ischemic cerebrovascular disease 06/12/2013  . Occlusion and stenosis of carotid artery without mention of cerebral infarction 02/26/2012  . Stroke ([place]) 02/03/2012  . Pre-syncope 02/01/2012  . H/O: CVA (cerebrovascular accident) 02/01/2012  . HTN  (hypertension) 02/01/2012  . Type II or unspecified type diabetes mellitus without mention of complication, not stated as uncontrolled   . Aneurysm of iliac artery ([place]) 09/04/2011    Past Surgical History:  Procedure Laterality Date  . basket procedure    . CAROTID ENDARTERECTOMY    . ENDARTERECTOMY  02/08/2012   Procedure: ENDARTERECTOMY CAROTID;  Surgeon: Mal Misty, MD;  Location: [facility];  Service: Vascular;  Laterality: Right;  . IR GENERIC HISTORICAL  02/07/2016   IR US GUIDE BX ASP/DRAIN 02/07/2016 Greggory Keen, MD [name]  . KIDNEY STONE SURGERY     removal  . PATCH ANGIOPLASTY  02/08/2012   Procedure: PATCH ANGIOPLASTY;  Surgeon: Mal Misty, MD;  Location: [facility];  Service: Vascular;  Laterality: Right;  with dacron patch angioplasty  . TEE WITHOUT CARDIOVERSION  01/08/2012   Procedure: TRANSESOPHAGEAL ECHOCARDIOGRAM (TEE);  Surgeon: Lelon Perla, MD;  Location: [facility] ENDOSCOPY;  Service: Cardiovascular;  Laterality: N/A;  . TONSILLECTOMY         Home Medications    Prior to Admission medications   Medication Sig Start Date End Date Taking? Authorizing Provider  amLODipine (NORVASC) 10 MG tablet Take 10 mg by mouth daily.   Yes Historical Provider, MD  atorvastatin (LIPITOR) 40 MG tablet Take 40 mg by mouth daily at 6 PM. 01/08/12  Yes Abelina Bachelor, PA-C  clopidogrel (PLAVIX) 75 MG tablet Take 75 mg by mouth daily with breakfast.  01/08/12  Yes Abelina Bachelor, PA-C  lisinopril (PRINIVIL,ZESTRIL) 5 MG tablet Take 5 mg by mouth daily.   Yes Historical Provider, MD  pioglitazone (ACTOS) 30 MG tablet Take 30 mg by mouth daily.   Yes Historical Provider, MD    Family History Family History  Problem Relation Age of Onset  . Diabetes Mother   . Lung cancer Father     Social History Social History  Substance Use Topics  . Smoking status: Former Smoker    Packs/day: 0.50    Years: 30.00    Types: Cigars    Quit date: 01/05/2012  . Smokeless tobacco: Former Systems developer    . Alcohol use 1.8 oz/week    1 Glasses of wine, 1 Cans of beer, 1 Shots of liquor per week     Comment: quit 2 months..Patient drinks coffee daily.OCCASIONAL DRINKER     Allergies   Bee venom; Metformin and related; and Sulfa drugs cross reactors   Review of Systems Review of Systems 10/14 systems reviewed and are negative other than those stated in the HPI   Physical Exam Updated Vital Signs BP 135/67 (BP Location: Left Leg)   Pulse (!) 102   Temp 99 F (37.2 C) (Oral)   Resp 20   Ht 5' 11.5" (1.816 m)   Wt 198 lb (89.8 kg)   SpO2 95%   BMI 27.23 kg/m   Physical Exam Physical Exam  Nursing note and vitals reviewed. Constitutional: Appears fatigued, non-toxic, and in no acute distress Head: Normocephalic and atraumatic.  Mouth/Throat: Oropharynx is clear and moist.  Neck: Normal range of motion. Neck supple.  Cardiovascular: Tachycardic rate and regular rhythm.   Pulmonary/Chest: Effort normal and breath sounds normal.  Abdominal: Soft. There is no tenderness. There is no rebound and no guarding.  Musculoskeletal: Normal range of motion.  Neurological: Alert, no facial droop, fluent speech, moves all extremities symmetrically Skin: Skin is warm and dry.  Psychiatric: Cooperative   ED Treatments / Results  Labs (all labs ordered are listed, but only abnormal results are displayed) Labs Reviewed  COMPREHENSIVE METABOLIC PANEL - Abnormal; Notable for the following:       Result Value   Sodium 132 (*)    Chloride 97 (*)    Glucose, Bld 147 (*)    Albumin 3.4 (*)    ALT 16 (*)    All other components within normal limits  CBC WITH DIFFERENTIAL/PLATELET - Abnormal; Notable for the following:    WBC 15.1 (*)    Neutro Abs 11.0 (*)    Monocytes Absolute 3.0 (*)    All other components within normal limits  CULTURE, BLOOD (ROUTINE X 2)  CULTURE, BLOOD (ROUTINE X 2)  URINE CULTURE  URINALYSIS, ROUTINE W REFLEX MICROSCOPIC (NOT AT [facility])  COMPREHENSIVE METABOLIC  PANEL  CBC  LACTATE DEHYDROGENASE  URIC ACID  HEPATITIS B SURFACE ANTIBODY  HEPATITIS B SURFACE ANTIGEN  HEPATITIS B CORE ANTIBODY, IGM  HIV ANTIBODY (ROUTINE TESTING)  I-STAT CG4 LACTIC ACID, ED    EKG  EKG Interpretation  Date/Time:  Sunday February 12 2016 10:52:30 EST Ventricular Rate:  109 PR Interval:    QRS Duration: 97 QT Interval:  321 QTC Calculation: 433 R Axis:   67 Text Interpretation:  Sinus tachycardia Consider right atrial enlargement Abnormal R-wave progression, early transition No significant change since last tracing Confirmed by Ameir Faria MD, Myishia Kasik [phone]) on 02/12/2016 12:07:53 PM       Radiology Dg Chest 2 View  Result  Date: 02/12/2016 CLINICAL DATA:  Fever and weakness. EXAM: CHEST  2 VIEW COMPARISON:  01/27/2016 FINDINGS: The heart size and mediastinal contours are within normal limits. Both lungs are clear. The visualized skeletal structures are unremarkable. IMPRESSION: No active cardiopulmonary disease. Electronically Signed   By: Kerby Moors M.D.   On: 02/12/2016 11:52    Procedures Procedures (including critical care time) CRITICAL CARE Performed by: Forde Dandy  ?  Total critical care time: 35 minutes  Critical care time was exclusive of separately billable procedures and treating other patients.  Critical care was necessary to treat or prevent imminent or life-threatening deterioration.  Critical care was time spent personally by me on the following activities: development of treatment plan with patient and/or surrogate as well as nursing, discussions with consultants, evaluation of patient's response to treatment, examination of patient, obtaining history from patient or surrogate, ordering and performing treatments and interventions, ordering and review of laboratory studies, ordering and review of radiographic studies, pulse oximetry and re-evaluation of patient's condition.  Medications Ordered in ED Medications  senna-docusate  (Senokot-S) tablet 2 tablet (2 tablets Oral Given 02/12/16 1654)  atorvastatin (LIPITOR) tablet 40 mg (not administered)  enoxaparin (LOVENOX) injection 40 mg (not administered)  sodium chloride flush (NS) 0.9 % injection 3 mL (not administered)  0.9 %  sodium chloride infusion ( Intravenous New Bag/Given 02/12/16 1656)  0.9 %  sodium chloride infusion (not administered)  feeding supplement (ENSURE ENLIVE) (ENSURE ENLIVE) liquid 237 mL (237 mLs Oral Given 02/12/16 1654)  aspirin EC tablet 81 mg (not administered)  sodium chloride 0.9 % bolus 1,000 mL (0 mLs Intravenous Stopped 02/12/16 1156)    And  sodium chloride 0.9 % bolus 1,000 mL (0 mLs Intravenous Stopped 02/12/16 1252)    And  sodium chloride 0.9 % bolus 1,000 mL (0 mLs Intravenous Stopped 02/12/16 1257)  acetaminophen (TYLENOL) tablet 650 mg (650 mg Oral Given 02/12/16 1202)  piperacillin-tazobactam (ZOSYN) IVPB 3.375 g (0 g Intravenous Stopped 02/12/16 1249)  vancomycin (VANCOCIN) IVPB 1000 mg/200 mL premix (0 mg Intravenous Stopped 02/12/16 1319)     Initial Impression / Assessment and Plan / ED Course  I have reviewed the triage vital signs and the nursing notes.  Pertinent labs & imaging results that were available during my care of the patient were reviewed by me and considered in my medical decision making (see chart for details).  Clinical Course     70 year old male who presents with generalized weakness and near syncope. He is febrile on presentation with a temperature 100.8, and tachycardic, but normotensive and in no respiratory distress. Presentation concerning for sepsis, but given his recent diagnosis of lymphoma this also may be cancer related B symptoms w/ fever.   Sepsis workup pursued he is noted to have leukocytosis of 15, normal lactic acid, normal creatinine. Chest x-ray visualized shows no pneumonia, edema or other acute cardiopulmonary processes. Urinalysis is normal. Blood cultures are obtained and  pending at this time. He was empirically covered with antibiotics while sepsis workup was pending and 3L of IVF per sepsis protocol.  Discussed with Dr. Alvy Bimler who will see patient in the hospital regarding his lymphoma. Discussed with hospitalist service who will admit for ongoing care.  Final Clinical Impressions(s) / ED Diagnoses   Final diagnoses:  Near syncope  Diffuse large B-cell lymphoma, unspecified body region [facility])    New Prescriptions Current Discharge Medication List       Forde Dandy, MD 02/12/16 1734

## 2016-02-12 NOTE — ED Notes (Signed)
Urinal at bedside, patient aware we need specimen

## 2016-02-12 NOTE — ED Triage Notes (Signed)
Per EMS pt from home, per wife pt had near syncope this Am. Denies fall. Recent Dx lymphoma, no treatment initiated yet. Per EMS pt was alert and oriented x 4 at the scene. Unremarkable EKG per EMS yet positive orthostatic . BP 80/50 upon standing .

## 2016-02-12 NOTE — ED Notes (Signed)
Unable to collect second set of cultures patient is in xray

## 2016-02-12 NOTE — ED Notes (Signed)
Bed: WA04 Expected date:  Expected time:  Means of arrival:  Comments: No bed, monitor or RN

## 2016-02-13 DIAGNOSIS — R61 Generalized hyperhidrosis: Secondary | ICD-10-CM | POA: Diagnosis present

## 2016-02-13 DIAGNOSIS — D72829 Elevated white blood cell count, unspecified: Secondary | ICD-10-CM | POA: Diagnosis not present

## 2016-02-13 DIAGNOSIS — R509 Fever, unspecified: Secondary | ICD-10-CM

## 2016-02-13 DIAGNOSIS — E860 Dehydration: Secondary | ICD-10-CM | POA: Diagnosis not present

## 2016-02-13 DIAGNOSIS — Z87891 Personal history of nicotine dependence: Secondary | ICD-10-CM | POA: Diagnosis not present

## 2016-02-13 DIAGNOSIS — E871 Hypo-osmolality and hyponatremia: Secondary | ICD-10-CM | POA: Diagnosis not present

## 2016-02-13 DIAGNOSIS — E118 Type 2 diabetes mellitus with unspecified complications: Secondary | ICD-10-CM

## 2016-02-13 DIAGNOSIS — Z5111 Encounter for antineoplastic chemotherapy: Secondary | ICD-10-CM | POA: Diagnosis not present

## 2016-02-13 DIAGNOSIS — E43 Unspecified severe protein-calorie malnutrition: Secondary | ICD-10-CM | POA: Diagnosis not present

## 2016-02-13 DIAGNOSIS — Z8673 Personal history of transient ischemic attack (TIA), and cerebral infarction without residual deficits: Secondary | ICD-10-CM | POA: Diagnosis not present

## 2016-02-13 DIAGNOSIS — N132 Hydronephrosis with renal and ureteral calculous obstruction: Secondary | ICD-10-CM | POA: Diagnosis present

## 2016-02-13 DIAGNOSIS — Z79899 Other long term (current) drug therapy: Secondary | ICD-10-CM | POA: Diagnosis not present

## 2016-02-13 DIAGNOSIS — D649 Anemia, unspecified: Secondary | ICD-10-CM | POA: Diagnosis not present

## 2016-02-13 DIAGNOSIS — N10 Acute tubulo-interstitial nephritis: Secondary | ICD-10-CM | POA: Diagnosis not present

## 2016-02-13 DIAGNOSIS — R531 Weakness: Secondary | ICD-10-CM | POA: Diagnosis not present

## 2016-02-13 DIAGNOSIS — E46 Unspecified protein-calorie malnutrition: Secondary | ICD-10-CM | POA: Diagnosis present

## 2016-02-13 DIAGNOSIS — R55 Syncope and collapse: Secondary | ICD-10-CM | POA: Diagnosis not present

## 2016-02-13 DIAGNOSIS — K219 Gastro-esophageal reflux disease without esophagitis: Secondary | ICD-10-CM | POA: Diagnosis present

## 2016-02-13 DIAGNOSIS — K5900 Constipation, unspecified: Secondary | ICD-10-CM | POA: Diagnosis present

## 2016-02-13 DIAGNOSIS — D7589 Other specified diseases of blood and blood-forming organs: Secondary | ICD-10-CM | POA: Diagnosis not present

## 2016-02-13 DIAGNOSIS — C8339 Diffuse large B-cell lymphoma, extranodal and solid organ sites: Secondary | ICD-10-CM | POA: Diagnosis not present

## 2016-02-13 DIAGNOSIS — Z8572 Personal history of non-Hodgkin lymphomas: Secondary | ICD-10-CM

## 2016-02-13 DIAGNOSIS — K5909 Other constipation: Secondary | ICD-10-CM | POA: Diagnosis not present

## 2016-02-13 DIAGNOSIS — C8338 Diffuse large B-cell lymphoma, lymph nodes of multiple sites: Secondary | ICD-10-CM

## 2016-02-13 DIAGNOSIS — Z7902 Long term (current) use of antithrombotics/antiplatelets: Secondary | ICD-10-CM | POA: Diagnosis not present

## 2016-02-13 DIAGNOSIS — E119 Type 2 diabetes mellitus without complications: Secondary | ICD-10-CM | POA: Diagnosis not present

## 2016-02-13 DIAGNOSIS — Z882 Allergy status to sulfonamides status: Secondary | ICD-10-CM | POA: Diagnosis not present

## 2016-02-13 DIAGNOSIS — E785 Hyperlipidemia, unspecified: Secondary | ICD-10-CM | POA: Diagnosis present

## 2016-02-13 DIAGNOSIS — R6510 Systemic inflammatory response syndrome (SIRS) of non-infectious origin without acute organ dysfunction: Secondary | ICD-10-CM | POA: Diagnosis not present

## 2016-02-13 DIAGNOSIS — C8330 Diffuse large B-cell lymphoma, unspecified site: Secondary | ICD-10-CM | POA: Diagnosis not present

## 2016-02-13 DIAGNOSIS — C8510 Unspecified B-cell lymphoma, unspecified site: Secondary | ICD-10-CM | POA: Diagnosis not present

## 2016-02-13 DIAGNOSIS — Z888 Allergy status to other drugs, medicaments and biological substances status: Secondary | ICD-10-CM | POA: Diagnosis not present

## 2016-02-13 DIAGNOSIS — R634 Abnormal weight loss: Secondary | ICD-10-CM | POA: Diagnosis not present

## 2016-02-13 DIAGNOSIS — I10 Essential (primary) hypertension: Secondary | ICD-10-CM | POA: Diagnosis not present

## 2016-02-13 DIAGNOSIS — E876 Hypokalemia: Secondary | ICD-10-CM | POA: Diagnosis not present

## 2016-02-13 DIAGNOSIS — E1151 Type 2 diabetes mellitus with diabetic peripheral angiopathy without gangrene: Secondary | ICD-10-CM | POA: Diagnosis present

## 2016-02-13 DIAGNOSIS — J449 Chronic obstructive pulmonary disease, unspecified: Secondary | ICD-10-CM | POA: Diagnosis present

## 2016-02-13 DIAGNOSIS — Z91030 Bee allergy status: Secondary | ICD-10-CM | POA: Diagnosis not present

## 2016-02-13 DIAGNOSIS — I959 Hypotension, unspecified: Secondary | ICD-10-CM | POA: Diagnosis present

## 2016-02-13 LAB — COMPREHENSIVE METABOLIC PANEL
ALT: 12 U/L — AB (ref 17–63)
AST: 17 U/L (ref 15–41)
Albumin: 3 g/dL — ABNORMAL LOW (ref 3.5–5.0)
Alkaline Phosphatase: 46 U/L (ref 38–126)
Anion gap: 5 (ref 5–15)
BUN: 11 mg/dL (ref 6–20)
CHLORIDE: 103 mmol/L (ref 101–111)
CO2: 25 mmol/L (ref 22–32)
CREATININE: 0.79 mg/dL (ref 0.61–1.24)
Calcium: 8.4 mg/dL — ABNORMAL LOW (ref 8.9–10.3)
Glucose, Bld: 118 mg/dL — ABNORMAL HIGH (ref 65–99)
Potassium: 3.7 mmol/L (ref 3.5–5.1)
Sodium: 133 mmol/L — ABNORMAL LOW (ref 135–145)
TOTAL PROTEIN: 7 g/dL (ref 6.5–8.1)
Total Bilirubin: 1 mg/dL (ref 0.3–1.2)

## 2016-02-13 LAB — URIC ACID: URIC ACID, SERUM: 3.1 mg/dL — AB (ref 4.4–7.6)

## 2016-02-13 LAB — URINE CULTURE: CULTURE: NO GROWTH

## 2016-02-13 LAB — CBC
HCT: 35.9 % — ABNORMAL LOW (ref 39.0–52.0)
Hemoglobin: 11.9 g/dL — ABNORMAL LOW (ref 13.0–17.0)
MCH: 28.9 pg (ref 26.0–34.0)
MCHC: 33.1 g/dL (ref 30.0–36.0)
MCV: 87.1 fL (ref 78.0–100.0)
PLATELETS: 219 10*3/uL (ref 150–400)
RBC: 4.12 MIL/uL — AB (ref 4.22–5.81)
RDW: 14.9 % (ref 11.5–15.5)
WBC: 15.7 10*3/uL — AB (ref 4.0–10.5)

## 2016-02-13 LAB — LACTATE DEHYDROGENASE: LDH: 271 U/L — AB (ref 98–192)

## 2016-02-13 LAB — HIV ANTIBODY (ROUTINE TESTING W REFLEX): HIV Screen 4th Generation wRfx: NONREACTIVE

## 2016-02-13 MED ORDER — SENNOSIDES-DOCUSATE SODIUM 8.6-50 MG PO TABS
1.0000 | ORAL_TABLET | Freq: Two times a day (BID) | ORAL | Status: DC
  Administered 2016-02-13 – 2016-02-15 (×5): 1 via ORAL
  Filled 2016-02-13 (×7): qty 1

## 2016-02-13 MED ORDER — ONDANSETRON HCL 4 MG/2ML IJ SOLN
4.0000 mg | Freq: Four times a day (QID) | INTRAMUSCULAR | Status: DC | PRN
  Administered 2016-02-13: 4 mg via INTRAVENOUS
  Filled 2016-02-13: qty 2

## 2016-02-13 MED ORDER — PIPERACILLIN-TAZOBACTAM 3.375 G IVPB
3.3750 g | Freq: Three times a day (TID) | INTRAVENOUS | Status: DC
  Administered 2016-02-13 – 2016-02-16 (×9): 3.375 g via INTRAVENOUS
  Filled 2016-02-13 (×7): qty 50

## 2016-02-13 MED ORDER — VANCOMYCIN HCL IN DEXTROSE 750-5 MG/150ML-% IV SOLN
750.0000 mg | Freq: Three times a day (TID) | INTRAVENOUS | Status: DC
  Administered 2016-02-13 – 2016-02-15 (×5): 750 mg via INTRAVENOUS
  Filled 2016-02-13 (×7): qty 150

## 2016-02-13 MED ORDER — VANCOMYCIN HCL IN DEXTROSE 1-5 GM/200ML-% IV SOLN
1000.0000 mg | Freq: Once | INTRAVENOUS | Status: AC
  Administered 2016-02-13: 1000 mg via INTRAVENOUS
  Filled 2016-02-13: qty 200

## 2016-02-13 MED ORDER — ONDANSETRON HCL 4 MG PO TABS
4.0000 mg | ORAL_TABLET | Freq: Three times a day (TID) | ORAL | Status: DC | PRN
  Administered 2016-02-14: 4 mg via ORAL
  Filled 2016-02-13: qty 1

## 2016-02-13 NOTE — Progress Notes (Signed)
Pharmacy Antibiotic Note  Samuel Berger is a 70 y.o. male with newly diagnosed diffuse large B cell lymphoma, presented to the ED on 02/13/16 with c/o of syncope. Vancomycin 1 gm and zosyn 3.375 gm IV x1 given in the ED for suspected sepsis.  Patient remains febrile with elevated WBC.  To resume vancomycin and zosyn.  Plan: - zosyn 3.375 gm IV q8h (infuse over 4 hours) - vancomycin 1gm IV x1, then 750 mg IV q8h - f/u cultures, renal funct  __________________________  Height: 5' 11.5" (181.6 cm) Weight: 198 lb (89.8 kg) IBW/kg (Calculated) : 76.45  Temp (24hrs), Avg:100 F (37.8 C), Min:98.7 F (37.1 C), Max:102.2 F (39 C)   Recent Labs Lab 02/07/16 1008 02/12/16 1120 02/12/16 1133 02/13/16 0447  WBC 10.4 15.1*  --  15.7*  CREATININE  --  0.88  --  0.79  LATICACIDVEN  --   --  1.85  --     Estimated Creatinine Clearance: 91.6 mL/min (by C-G formula based on SCr of 0.79 mg/dL).    Allergies  Allergen Reactions  . Bee Venom Hives  . Metformin And Related     dizziness  . Sulfa Drugs Cross Reactors Other (See Comments)    Unknown    Antimicrobials this admission:  11/12 zosyn /vanc x1 11/13 vanc>> 11/13 zosyn>>   Dose adjustments this admission:  n/a  Microbiology results:  11/12 BCx x2: 11/12 UCx:   11/12 UA (-)  Thank you for allowing pharmacy to be a part of this patient's care.  Lynelle Doctor 02/13/2016 10:56 AM

## 2016-02-13 NOTE — Progress Notes (Signed)
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 He was found to have a sepsis like picture with Fever, Elevated WBC, and Hypotension in the ED and rehydrated and placed on IV Vanc/Zosyn in Ed. He was admitted for Syncope likely 2/2 to Dehydration.   Assessment & Plan:   Active Problems:   Large B-cell lymphoma (HCC)   Syncope  Syncope -Probably 2/2 Volume depletion Because of poor by mouth intake -Hold Lisinopril 5 mg as well as Amlodipine 10 mg -C/w IVF Rehydration at 125 mL/hr -PT/OT to Evaluate and Treat   Diffuse Large B-cell Lymphoma with type B symptoms -Dr. Alvy Bimler in Oncology Following and appreciate Recc's -Workup in Progress and possible port as Cancer is fairly aggressive. Dr. Alvy Bimler to discuss with Pathology about Ki-67 and Hit -Plan to Start Chemotherapy soon -IR Guided Port Placement for Thursday scheduled  Diabetes mellitus type 2 -Hold off on Actos for now -Sliding scale coverage 4 times a day before meals at bedtime -Will consider Remeron to stimulate his appetite  Prior CVA + Endarterectomy  -Continue Atorvastatin 40 mg and hold Plavix 75 daily in anticipation for a Port -Asprin 81 mg Substituted for possible port placement Thrusday  SIRS with Unclear Source of Infection -Possibly from Lymphoma -C/w IVF Rehydration and with IV Abx with IV Vanc and IV Zosyn -May need CT Scan of Abdomen and Pelvis per Dr. Alvy Bimler  Hyponatremia -Na+ went from 132 -> 133 -C/w IVF Rehydration  DVT prophylaxis: Lovenox 40 mg Code Status:  FULL Family Communication: Discussed with Patient's Daughter at bedside Disposition Plan: Likely workup for and possible Chemotherapy for Diffuse B Cell Lymphoma  Consultants:   Hematology/Oncology Dr. Heath Lark  Procedures: None  Antimicrobials: IV Vancomycin and IV Zosyn  Subjective: Seen and examined at bedside and was doing better. Stated he walked with Physical Therapy with no problems. No N/V and has not had a fever this Am. No other concerns or complaints.   Objective: Vitals:   02/12/16 1852 02/12/16 2103 02/12/16 2236 02/13/16 0539  BP:  (!) 167/67 (!) 135/56   Pulse:  (!) 111 (!) 104   Resp:  (!) _0 Temp: 100.3 F (37.9 C) (!) 102.2 F (39 C) 99.7 F (37.6 C) 98.7 F (37.1 C)  TempSrc: Oral Oral Oral Oral  SpO2:  96% 95% 95%  Weight:      Height:        Intake/Output Summary (Last 24 hours) at 02/13/16 1738 Last data filed at 02/13/16 0900  Gross per 24 hour  Intake          2353.33 ml  Output              901 ml  Net          1452.33 ml   Filed Weights   02/12/16 1600  Weight: 89.8 kg (198 lb)    Examination: Physical Exam:  Constitutional: WN/WD, NAD and appears calm and comfortable Eyes: Lids and conjunctivae normal, sclerae anicteric  ENMT: External Ears, Nose appear normal. Grossly normal hearing.  Neck: Appears normal, supple, no cervical masses, normal ROM, no appreciable thyromegaly Respiratory: Clear to auscultation bilaterally, no wheezing, rales, rhonchi or crackles. Normal respiratory effort and patient is not tachypenic. No accessory muscle use.  Cardiovascular: RRR, no murmurs / rubs / gallops. S1 and S2 auscultated.  Abdomen: Soft, non-tender, non-distended. No masses palpated. No appreciable hepatosplenomegaly. Bowel sounds positive.  GU: Deferred. Musculoskeletal: No clubbing / cyanosis of digits/nails. No joint deformity upper and lower extremities.  Skin: No rashes, lesions, ulcers. No induration; Warm and dry.  Neurologic:  CN 2-12 grossly intact with no focal deficits. Sensation intact in all 4 Extremities, Romberg sign cerebellar reflexes not assessed.  Psychiatric: Normal judgment and insight. Alert and oriented x 3. Normal mood and appropriate affect.   Data Reviewed: I have personally reviewed following labs and imaging studies  CBC:  Recent Labs Lab 02/07/16 1008 02/12/16 1120 02/13/16 0447  WBC 10.4 15.1* 15.7*  NEUTROABS  --  11.0*  --   HGB 13.2 13.3 11.9*  HCT 39.3 39.9 35.9*  MCV 87.9 88.3 87.1  PLT 212 234 782   Basic Metabolic Panel:  Recent Labs Lab 02/12/16 1120 02/13/16 0447  NA 132* 133*  K 4.4 3.7  CL 97* 103  CO2 26 25  GLUCOSE 147* 118*  BUN 14 11  CREATININE 0.88 0.79  CALCIUM 8.9 8.4*   GFR: Estimated Creatinine Clearance: 91.6 mL/min (by C-G formula based on SCr of 0.79 mg/dL). Liver Function Tests:  Recent Labs Lab 02/12/16 1120 02/13/16 0447  AST 20 17  ALT 16* 12*  ALKPHOS 52 46  BILITOT 0.7 1.0  PROT 7.6 7.0  ALBUMIN 3.4* 3.0*   No results for input(s): LIPASE, AMYLASE in the last 168 hours. No results for input(s): AMMONIA in the last 168 hours. Coagulation Profile:  Recent Labs Lab 02/07/16 1008  INR 1.06   Cardiac Enzymes: No results for input(s): CKTOTAL, CKMB, CKMBINDEX, TROPONINI in the last 168 hours. BNP (last 3 results) No results for input(s): PROBNP in the last 8760 hours. HbA1C: No results for input(s): HGBA1C in the last 72 hours. CBG:  Recent Labs Lab 02/07/16 0957 02/12/16 2103  GLUCAP 129* 123*   Lipid Profile: No results for input(s): CHOL, HDL, LDLCALC, TRIG, CHOLHDL, LDLDIRECT in the last 72 hours. Thyroid Function Tests: No results for input(s): TSH, T4TOTAL, FREET4, T3FREE, THYROIDAB in the last 72 hours. Anemia Panel: No results for input(s): VITAMINB12, FOLATE, FERRITIN, TIBC, IRON, RETICCTPCT in the last 72 hours. Sepsis Labs:  Recent Labs Lab 02/12/16 1133  LATICACIDVEN 1.85    Recent Results (from  the past 240 hour(s))  Blood Culture (routine x 2)     Status: None (Preliminary result)   Collection Time: 02/12/16 11:21 AM  Result Value Ref Range Status   Specimen Description BLOOD RIGHT AC  Final   Special Requests BOTTLES DRAWN AEROBIC AND ANAEROBIC 5CC  Final   Culture   Final    NO GROWTH < 24 HOURS Performed at [facility]    Report Status PENDING  Incomplete  Blood Culture (routine x 2)     Status: None (Preliminary result)   Collection Time: 02/12/16 11:57 AM  Result Value Ref Range Status   Specimen Description BLOOD LEFT HAND  Final   Special Requests IN PEDIATRIC BOTTLE 2CC  Final   Culture   Final    NO GROWTH < 24 HOURS Performed at [facility]    Report Status PENDING  Incomplete  Urine culture  Status: None   Collection Time: 02/12/16  1:11 PM  Result Value Ref Range Status   Specimen Description URINE, CLEAN CATCH  Final   Special Requests NONE  Final   Culture NO GROWTH Performed at [facility]   Final   Report Status 02/13/2016 FINAL  Final    Radiology Studies: Dg Chest 2 View  Result Date: 02/12/2016 CLINICAL DATA:  Fever and weakness. EXAM: CHEST  2 VIEW COMPARISON:  01/27/2016 FINDINGS: The heart size and mediastinal contours are within normal limits. Both lungs are clear. The visualized skeletal structures are unremarkable. IMPRESSION: No active cardiopulmonary disease. Electronically Signed   By: Kerby Moors M.D.   On: 02/12/2016 11:52   Scheduled Meds: . aspirin EC  81 mg Oral Daily  . atorvastatin  40 mg Oral q1800  . enoxaparin (LOVENOX) injection  40 mg Subcutaneous Q24H  . feeding supplement (ENSURE ENLIVE)  237 mL Oral BID BM  . piperacillin-tazobactam (ZOSYN)  IV  3.375 g Intravenous Q8H  . senna-docusate  1 tablet Oral BID  . sodium chloride flush  3 mL Intravenous Q12H  . vancomycin  750 mg Intravenous Q8H   Continuous Infusions: . sodium chloride 125 mL/hr at 02/13/16 1554  . sodium chloride        LOS: 1 day   Kerney Elbe, DO [facility] Pager [phone]  If 7PM-7AM, please contact night-coverage [email or web address] Password [facility] 02/13/2016, 5:38 PM

## 2016-02-13 NOTE — Care Management Obs Status (Signed)
[place] NOTIFICATION   Patient Details  Name: Samuel Berger MRN: [id number] Date of Birth: November 20, 1945   Medicare Observation Status Notification Given:  Yes    MahabirJuliann Pulse, RN 02/13/2016, 1:34 PM

## 2016-02-13 NOTE — Evaluation (Signed)
Physical Therapy Evaluation Patient Details Name: Samuel Berger MRN: [id number] DOB: [birth year] Today's Date: 02/13/2016   History of Present Illness  Pt admitted through ED wtih near syncope, weakness and dehydrations.  Pt with hx of CVA(13) with no residual deficits, DM and very recent dx of Lymphoma  Clinical Impression  Pt mobilizing without assistance and with no apparent balance deficits.  Pt able to step backwards, sideways without difficulty and performs stork stand and tandem step with min problem.  Pt feels he is at baseline function and denies any dizziness throughout session.  Pt will be dc from PT service at this time.  Discussed with RN and pt will continue to mobilize in halls with family.    Follow Up Recommendations No PT follow up    Equipment Recommendations  None recommended by PT    Recommendations for Other Services       Precautions / Restrictions Precautions Precautions: Fall Restrictions Weight Bearing Restrictions: No      Mobility  Bed Mobility               General bed mobility comments: NT - pt up in chair and requests return to chair for breakfast  Transfers Overall transfer level: Modified independent   Transfers: Sit to/from Stand Sit to Stand: Modified independent (Device/Increase time)         General transfer comment: Pt utilizing arms on chair with no assist needed  Ambulation/Gait Ambulation/Gait assistance: Independent Ambulation Distance (Feet): 500 Feet Assistive device: None Gait Pattern/deviations: WFL(Within Functional Limits)   Gait velocity interpretation: at or above normal speed for age/gender General Gait Details: No difficulties noted.  Pt ambulated at appropriate speed with no loss of balance  Stairs            Wheelchair Mobility    Modified Rankin (Stroke Patients Only)       Balance Overall balance assessment: No apparent balance deficits (not formally assessed)                                            Pertinent Vitals/Pain Pain Assessment: No/denies pain    Home Living Family/patient expects to be discharged to:: Private residence Living Arrangements: Spouse/significant other Available Help at Discharge: Available PRN/intermittently Type of Home: House Home Access: Stairs to enter Entrance Stairs-Rails: None Entrance Stairs-Number of Steps: 2 Home Layout: Multi-level        Prior Function Level of Independence: Independent               Hand Dominance        Extremity/Trunk Assessment   Upper Extremity Assessment: Overall WFL for tasks assessed           Lower Extremity Assessment: Overall WFL for tasks assessed      Cervical / Trunk Assessment: Normal  Communication   Communication: No difficulties  Cognition Arousal/Alertness: Awake/alert Behavior During Therapy: WFL for tasks assessed/performed Overall Cognitive Status: Within Functional Limits for tasks assessed                      General Comments      Exercises     Assessment/Plan    PT Assessment Patent does not need any further PT services  PT Problem List            PT Treatment Interventions      PT Goals (  Current goals can be found in the Care Plan section)  Acute Rehab PT Goals Patient Stated Goal: HOME PT Goal Formulation: All assessment and education complete, DC therapy    Frequency     Barriers to discharge        Co-evaluation               End of Session   Activity Tolerance: Patient tolerated treatment well Patient left: in chair;with call bell/phone within reach;with family/visitor present Nurse Communication: Mobility status;Other (comment) (Cleared for ambulation in halls with family)    Functional Assessment Tool Used: Clinical judgement Functional Limitation: Mobility: Walking and moving around Mobility: Walking and Moving Around Current Status [phone]): 0 percent impaired, limited or  restricted Mobility: Walking and Moving Around Goal Status [phone]): 0 percent impaired, limited or restricted Mobility: Walking and Moving Around Discharge Status [phone]): 0 percent impaired, limited or restricted    Time: 0851-0905 PT Time Calculation (min) (ACUTE ONLY): 14 min   Charges:   PT Evaluation $PT Eval Low Complexity: 1 Procedure     PT G Codes:   PT G-Codes **NOT FOR INPATIENT CLASS** Functional Assessment Tool Used: Clinical judgement Functional Limitation: Mobility: Walking and moving around Mobility: Walking and Moving Around Current Status [id number]): 0 percent impaired, limited or restricted Mobility: Walking and Moving Around Goal Status [id number]): 0 percent impaired, limited or restricted Mobility: Walking and Moving Around Discharge Status [id number]): 0 percent impaired, limited or restricted    Samuel Berger 02/13/2016, 11:30 AM

## 2016-02-13 NOTE — Care Management Note (Signed)
Case Management Note  Patient Details  Name: Samuel Berger MRN: [id number] Date of Birth: 07-Feb-1946  Subjective/Objective: 70 y/o m admitted w/:arge B cell Lymphoma. From home.                   Action/Plan:d/c plan home.   Expected Discharge Date:                 Expected Discharge Plan:  Home/Self Care  In-House Referral:     Discharge planning Services  CM Consult  Post Acute Care Choice:    Choice offered to:     DME Arranged:    DME Agency:     HH Arranged:    HH Agency:     Status of Service:  In process, will continue to follow  If discussed at Long Length of Stay Meetings, dates discussed:    Additional Comments:  Dessa Phi, RN 02/13/2016, 1:51 PM

## 2016-02-13 NOTE — Progress Notes (Addendum)
Report given by Deneen Harts No new changes noted from prior nurses assessment. Will continue to assess patient.

## 2016-02-13 NOTE — Progress Notes (Signed)
Samuel Berger   DOB:1945/04/21   XF#:[id number]    Subjective: He felt slightly better since admission. He has been febrile since admission. Denies pain. Constipation has resolved  Objective:  Vitals:   02/12/16 2236 02/13/16 0539  BP: (!) 135/56   Pulse: (!) 104   Resp: 18 18  Temp: 99.7 F (37.6 C) 98.7 F (37.1 C)     Intake/Output Summary (Last 24 hours) at 02/13/16 1022 Last data filed at 02/13/16 0900  Gross per 24 hour  Intake          2353.33 ml  Output              901 ml  Net          1452.33 ml    GENERAL:alert, no distress and comfortable SKIN: skin color, texture, turgor are normal, no rashes or significant lesions EYES: normal, Conjunctiva are pink and non-injected, sclera clear Musculoskeletal:no cyanosis of digits and no clubbing  NEURO: alert & oriented x 3 with fluent speech, no focal motor/sensory deficits   Labs:  Lab Results  Component Value Date   WBC 15.7 (H) 02/13/2016   HGB 11.9 (L) 02/13/2016   HCT 35.9 (L) 02/13/2016   MCV 87.1 02/13/2016   PLT 219 02/13/2016   NEUTROABS 11.0 (H) 02/12/2016    Lab Results  Component Value Date   NA 133 (L) 02/13/2016   K 3.7 02/13/2016   CL 103 02/13/2016   CO2 25 02/13/2016    Studies:  Dg Chest 2 View  Result Date: 02/12/2016 CLINICAL DATA:  Fever and weakness. EXAM: CHEST  2 VIEW COMPARISON:  01/27/2016 FINDINGS: The heart size and mediastinal contours are within normal limits. Both lungs are clear. The visualized skeletal structures are unremarkable. IMPRESSION: No active cardiopulmonary disease. Electronically Signed   By: Signa Kell M.D.   On: 02/12/2016 11:52    Assessment & Plan:   Diffuse large B cell lymphoma, at least stage III Significant B symptoms with anorexia, weight loss and night sweats He needs urgent inpatient work-up. I recommend he stops Plavix in anticipation for port placement. The patient has poor venous access. We can try to get port placement at the end of the week  (potentially as outpatient) along with bone marrow biopsy to complete his staging I will get pathologist who ordered FISH analysis to exclude triple Hit Lymphoma, given high Ki-67 score Will get his case presented at the next Hematology tumor board We discussed briefly about the approach for diffuse large B-cell lymphoma including the plan for urgent chemotherapy soon I would prefer to get everything done over next few days with plan to start him on treatment early next week LDH level is elevated  Fever, leukocytosis Source is unknown. If the fever does not improve, we might have to repeat CT scan of the abdomen to exclude hydronephrosis or other causes for leukocytosis  Anorexia with weight loss Hyponatremia due to poor oral intake I will consult dietitian for review. Recommended frequent small meals Recommend IV fluids  Chronic constipation, resolved We discussed management with laxative, increase fiber intake and hydration  History of stroke without residual deficit I recommend stopping Plavix and substitute with 81 mg aspirin. With his recent near-syncopal episode, I suspect is due to hypotension due to poor oral intake and dehydration. I will stop lisinopril as well  Weakness with near syncopal episode  Consult PT for safety evaluation   Discharge planning  Unknown. He may need to be started on  treatment while hospitalized   Heath Lark, MD 02/13/2016  10:22 AM

## 2016-02-13 NOTE — Care Management Note (Deleted)
Case Management Note  Patient Details  Name: Samuel Berger MRN: [id number] Date of Birth: 02/12/1946  Subjective/Objective: 70 y/o m admitted w/Large B-cell lymphoma. From home.PT cons-await recc.                   Action/Plan:d/c plan home.   Expected Discharge Date:                 Expected Discharge Plan:  Home/Self Care  In-House Referral:     Discharge planning Services  CM Consult  Post Acute Care Choice:    Choice offered to:     DME Arranged:    DME Agency:     HH Arranged:    HH Agency:     Status of Service:  In process, will continue to follow  If discussed at Long Length of Stay Meetings, dates discussed:    Additional Comments:  Dessa Phi, RN 02/13/2016, 1:35 PM

## 2016-02-14 ENCOUNTER — Other Ambulatory Visit: Payer: Self-pay | Admitting: Hematology and Oncology

## 2016-02-14 ENCOUNTER — Encounter (HOSPITAL_COMMUNITY): Payer: Self-pay | Admitting: Radiology

## 2016-02-14 ENCOUNTER — Inpatient Hospital Stay (HOSPITAL_COMMUNITY): Payer: Medicare Other

## 2016-02-14 DIAGNOSIS — R6510 Systemic inflammatory response syndrome (SIRS) of non-infectious origin without acute organ dysfunction: Secondary | ICD-10-CM

## 2016-02-14 DIAGNOSIS — E43 Unspecified severe protein-calorie malnutrition: Secondary | ICD-10-CM | POA: Insufficient documentation

## 2016-02-14 LAB — COMPREHENSIVE METABOLIC PANEL
ALBUMIN: 2.8 g/dL — AB (ref 3.5–5.0)
ALK PHOS: 49 U/L (ref 38–126)
ALT: 12 U/L — AB (ref 17–63)
AST: 18 U/L (ref 15–41)
Anion gap: 7 (ref 5–15)
BILIRUBIN TOTAL: 0.9 mg/dL (ref 0.3–1.2)
BUN: 12 mg/dL (ref 6–20)
CALCIUM: 8.5 mg/dL — AB (ref 8.9–10.3)
CO2: 24 mmol/L (ref 22–32)
Chloride: 104 mmol/L (ref 101–111)
Creatinine, Ser: 0.9 mg/dL (ref 0.61–1.24)
GFR calc Af Amer: >60 mL/min (ref >60)
GFR calc non Af Amer: >60 mL/min (ref >60)
GLUCOSE: 121 mg/dL — AB (ref 65–99)
Potassium: 3.6 mmol/L (ref 3.5–5.1)
SODIUM: 135 mmol/L (ref 135–145)
TOTAL PROTEIN: 6.2 g/dL — AB (ref 6.5–8.1)

## 2016-02-14 LAB — CBC WITH DIFFERENTIAL/PLATELET
BASOS ABS: 0 10*3/uL (ref 0.0–0.1)
Basophils Relative: 0 %
Eosinophils Absolute: 0.1 10*3/uL (ref 0.0–0.7)
Eosinophils Relative: 1 %
HEMATOCRIT: 31.7 % — AB (ref 39.0–52.0)
HEMOGLOBIN: 10.9 g/dL — AB (ref 13.0–17.0)
LYMPHS PCT: 12 %
Lymphs Abs: 1.6 10*3/uL (ref 0.7–4.0)
MCH: 30 pg (ref 26.0–34.0)
MCHC: 34.4 g/dL (ref 30.0–36.0)
MCV: 87.3 fL (ref 78.0–100.0)
MONOS PCT: 17 %
Monocytes Absolute: 2.3 10*3/uL — ABNORMAL HIGH (ref 0.1–1.0)
NEUTROS ABS: 9.4 10*3/uL — AB (ref 1.7–7.7)
NEUTROS PCT: 70 %
Platelets: 160 10*3/uL (ref 150–400)
RBC: 3.63 MIL/uL — AB (ref 4.22–5.81)
RDW: 14.8 % (ref 11.5–15.5)
WBC: 13.4 10*3/uL — ABNORMAL HIGH (ref 4.0–10.5)

## 2016-02-14 LAB — HEPATITIS B SURFACE ANTIGEN: HEP B S AG: NEGATIVE

## 2016-02-14 LAB — HEPATITIS B CORE ANTIBODY, IGM: HEP B C IGM: NEGATIVE

## 2016-02-14 LAB — HEPATITIS B SURFACE ANTIBODY,QUALITATIVE: Hep B S Ab: NONREACTIVE

## 2016-02-14 LAB — PHOSPHORUS: Phosphorus: 3.5 mg/dL (ref 2.5–4.6)

## 2016-02-14 LAB — MAGNESIUM: Magnesium: 1.9 mg/dL (ref 1.7–2.4)

## 2016-02-14 MED ORDER — IOPAMIDOL (ISOVUE-300) INJECTION 61%
100.0000 mL | Freq: Once | INTRAVENOUS | Status: AC | PRN
  Administered 2016-02-14: 100 mL via INTRAVENOUS

## 2016-02-14 MED ORDER — IOPAMIDOL (ISOVUE-300) INJECTION 61%
15.0000 mL | Freq: Two times a day (BID) | INTRAVENOUS | Status: AC | PRN
  Administered 2016-02-14: 15 mL via ORAL

## 2016-02-14 MED ORDER — HYDROCODONE-ACETAMINOPHEN 7.5-325 MG PO TABS
1.0000 | ORAL_TABLET | ORAL | Status: DC | PRN
  Administered 2016-02-14: 2 via ORAL
  Filled 2016-02-14: qty 2

## 2016-02-14 NOTE — Progress Notes (Signed)
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 He was found to have a sepsis like picture with Fever, Elevated WBC, and Hypotension in the ED and rehydrated and placed on IV Vanc/Zosyn in Ed. He was admitted for Syncope likely 2/2 to Dehydration. He is being worked up for His Lymphoma and per Oncology will get a Port on Thursday, Bone Marrow Biopsy, and start Chemotherapy.   Assessment & Plan:   Active Problems:   Large B-cell lymphoma (HCC)   Syncope   Diffuse large B-cell lymphoma (HCC)   Hyponatremia  Syncope -No recurrent episodes -Probably 2/2 Volume depletion Because of poor by mouth intake -Hold Lisinopril 5 mg as well as Amlodipine 10 mg -C/w IVF Rehydration at 75 mL/hr -PT/OT to Evaluate and Treat and recommended no follow up. -Repeat Orthostatics   Diffuse Large B-cell Lymphoma with type B symptoms -Dr. Alvy Bimler in Oncology Following and appreciate Recc's -Workup in Progress and Port Placement Thursday (02/16/2016) as Cancer is fairly aggressive. Dr. 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 Alvy Bimler and she recommends obtaining CT of Abdomen and Pelvis with Contrast to Evaluate -C/w Ensure Enlive 237 mL po BID  Diabetes Mellitus Type 2 -Hold off on Actos for now -Sliding scale coverage 4 times a day before meals at bedtime -Will  consider Remeron to stimulate his appetite  Prior CVA + Endarterectomy  -Continue Atorvastatin 40 mg and hold Plavix 75 daily in anticipation for a Port -Asprin 81 mg Substituted for possible port placement Thrusday  SIRS with Unclear Source of Infection -Patient was Febrile and had elevated WBC -Has been afebrile now; C.w Acetaminophen 500 mg po q6hprn for Temp > 100.5 -Leukocytosis improving and went from 15.7 -> 13.4 -CT of Abdomen and Pelvis with Contrast today -Possibly from Lymphoma -C/w IVF Rehydration and with IV Abx with IV Vanc and IV Zosyn  Hyponatremia, improved -Na+ went from 132 -> 133 -> 135 -C/w IVF Rehydration at 75 mL/hr  DVT prophylaxis: Lovenox 40 mg Code Status: FULL Family Communication: Discussed with Patient's Daughter at bedside Disposition Plan: Likely workup for and possible Chemotherapy for Diffuse B Cell Lymphoma and Home on Thursday  Consultants:   Hematology/Oncology Dr. Heath Lark  Procedures: None  Antimicrobials: IV Vancomycin and IV Zosyn  Subjective: Seen and examined at bedside and was doing better and wanted to do go home. Per Daughter he had N/V yesterday. No repeat episodes of N/V today. No CP/SOB or Abdominal Pain. Dr. Alvy Bimler spoke with patient and will try get CT of Abdomen and Pelvis with Contrast. No other concerns or complaints at this time.   Objective: Vitals:   02/12/16 2236 02/13/16 0539 02/13/16 2013 02/14/16 0600  BP: (!) 135/56  134/62 (!) 139/53  Pulse: (!) 104  79 83  Resp: _0 Temp: 99.7 F (37.6 C) 98.7 F (37.1 C) 98.1  F (36.7 C) 97.7 F (36.5 C)  TempSrc: Oral Oral Oral Oral  SpO2: 95% 95% 98% 94%  Weight:      Height:        Intake/Output Summary (Last 24 hours) at 02/14/16 1439 Last data filed at 02/14/16 0300  Gross per 24 hour  Intake             2825 ml  Output                0 ml  Net             2825 ml   Filed Weights   02/12/16 1600  Weight: 89.8 kg (198 lb)     Examination: Physical Exam:  Constitutional: WN/WD, NAD and appears calm and comfortable at edge of bed Eyes: Lids and conjunctivae normal, sclerae anicteric  ENMT: External Ears, Nose appear normal. Grossly normal hearing.  Neck: Appears normal, supple, no cervical masses, normal ROM, no appreciable thyromegaly Respiratory: Clear to auscultation bilaterally, no wheezing, rales, rhonchi or crackles. Normal respiratory effort and patient is not tachypenic. No accessory muscle use.  Cardiovascular: RRR, no murmurs / rubs / gallops. S1 and S2 auscultated.  Abdomen: Soft, non-tender, non-distended. No masses palpated. No appreciable hepatosplenomegaly. Bowel sounds positive x4.  GU: Deferred. Musculoskeletal: No clubbing / cyanosis of digits/nails. No joint deformity upper and lower extremities.  Skin: No rashes, lesions, ulcers. No induration; Warm and dry.  Neurologic: CN 2-12 grossly intact with no focal deficits. Sensation intact in all 4 Extremities, Romberg sign cerebellar reflexes not assessed.  Psychiatric: Normal judgment and insight. Alert and oriented x 3. Normal mood and appropriate affect.   Data Reviewed: I have personally reviewed following labs and imaging studies  CBC:  Recent Labs Lab 02/12/16 1120 02/13/16 0447 02/14/16 0420  WBC 15.1* 15.7* 13.4*  NEUTROABS 11.0*  --  9.4*  HGB 13.3 11.9* 10.9*  HCT 39.9 35.9* 31.7*  MCV 88.3 87.1 87.3  PLT 234 219 292   Basic Metabolic Panel:  Recent Labs Lab 02/12/16 1120 02/13/16 0447 02/14/16 0420  NA 132* 133* 135  K 4.4 3.7 3.6  CL 97* 103 104  CO2 _0 GLUCOSE 147* 118* 121*  BUN _1 CREATININE 0.88 0.79 0.90  CALCIUM 8.9 8.4* 8.5*  MG  --   --  1.9  PHOS  --   --  3.5   GFR: Estimated Creatinine Clearance: 81.5 mL/min (by C-G formula based on SCr of 0.9 mg/dL). Liver Function Tests:  Recent Labs Lab 02/12/16 1120 02/13/16 0447 02/14/16 0420  AST _2 ALT 16* 12* 12*  ALKPHOS 52  46 49  BILITOT 0.7 1.0 0.9  PROT 7.6 7.0 6.2*  ALBUMIN 3.4* 3.0* 2.8*   No results for input(s): LIPASE, AMYLASE in the last 168 hours. No results for input(s): AMMONIA in the last 168 hours. Coagulation Profile: No results for input(s): INR, PROTIME in the last 168 hours. Cardiac Enzymes: No results for input(s): CKTOTAL, CKMB, CKMBINDEX, TROPONINI in the last 168 hours. BNP (last 3 results) No results for input(s): PROBNP in the last 8760 hours. HbA1C: No results for input(s): HGBA1C in the last 72 hours. CBG:  Recent Labs Lab 02/12/16 2103  GLUCAP 123*   Lipid Profile: No results for input(s): CHOL, HDL, LDLCALC, TRIG, CHOLHDL, LDLDIRECT in the last 72 hours. Thyroid Function Tests: No results for input(s): TSH, T4TOTAL, FREET4, T3FREE, THYROIDAB in the last 72 hours. Anemia Panel:  No results for input(s): VITAMINB12, FOLATE, FERRITIN, TIBC, IRON, RETICCTPCT in the last 72 hours. Sepsis Labs:  Recent Labs Lab 02/12/16 1133  LATICACIDVEN 1.85    Recent Results (from the past 240 hour(s))  Blood Culture (routine x 2)     Status: None (Preliminary result)   Collection Time: 02/12/16 11:21 AM  Result Value Ref Range Status   Specimen Description BLOOD RIGHT AC  Final   Special Requests BOTTLES DRAWN AEROBIC AND ANAEROBIC 5CC  Final   Culture   Final    NO GROWTH < 24 HOURS Performed at [facility]    Report Status PENDING  Incomplete  Blood Culture (routine x 2)     Status: None (Preliminary result)   Collection Time: 02/12/16 11:57 AM  Result Value Ref Range Status   Specimen Description BLOOD LEFT HAND  Final   Special Requests IN PEDIATRIC BOTTLE [place]  Final   Culture   Final    NO GROWTH < 24 HOURS Performed at [facility]    Report Status PENDING  Incomplete  Urine culture     Status: None   Collection Time: 02/12/16  1:11 PM  Result Value Ref Range Status   Specimen Description URINE, CLEAN CATCH  Final   Special Requests NONE  Final    Culture NO GROWTH Performed at [facility]   Final   Report Status 02/13/2016 FINAL  Final    Radiology Studies: No results found. Scheduled Meds: . aspirin EC  81 mg Oral Daily  . atorvastatin  40 mg Oral q1800  . enoxaparin (LOVENOX) injection  40 mg Subcutaneous Q24H  . feeding supplement (ENSURE ENLIVE)  237 mL Oral BID BM  . piperacillin-tazobactam (ZOSYN)  IV  3.375 g Intravenous Q8H  . senna-docusate  1 tablet Oral BID  . sodium chloride flush  3 mL Intravenous Q12H  . vancomycin  750 mg Intravenous Q8H   Continuous Infusions: . sodium chloride 125 mL/hr at 02/14/16 0038  . sodium chloride      LOS: 2 days   Kerney Elbe, DO [facility] Pager [phone]  If 7PM-7AM, please contact night-coverage [email or web address] Password [facility] 02/14/2016, 2:39 PM

## 2016-02-14 NOTE — Progress Notes (Signed)
Received patient to room 1324 from 4E, VS obtained, oriented to unit, call light placed in reach

## 2016-02-14 NOTE — Progress Notes (Signed)
Lymphoma and CLL - No Medical Intervention - Off Treatment.  Patient Characteristics: Double Hit Lymphoma, First Line Disease Type: Not Applicable Disease Type: Double Hit Lymphoma Line of therapy: First Line

## 2016-02-14 NOTE — Progress Notes (Signed)
Initial Nutrition Assessment  DOCUMENTATION CODES:   Severe malnutrition in context of acute illness/injury  INTERVENTION:  - Continue Ensure Enlive po BID, each supplement provides 350 kcal and 20 grams of protein - Continue to encourage PO intakes of meals and supplements. - RD will continue to monitor for nutrition-related needs. - RD will follow-up 11/17.  NUTRITION DIAGNOSIS:   Inadequate oral intake related to acute illness, poor appetite as evidenced by per patient/family report.  GOAL:   Patient will meet greater than or equal to 90% of their needs  MONITOR:   Diet advancement, PO intake, Weight trends, Labs, I & O's  REASON FOR ASSESSMENT:   Malnutrition Screening Tool  ASSESSMENT:   [name] with  PMH of HTN, HLD, Diabetes Mellitus Type 2, PVD, GERD, G1DD with EF of 60-65%, Prior CVA and Endarterectomy on Right, COPD with Prior Tobacco Abuse who presented with a Syncopal Episode while doing work around his house. 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 He was found to have a sepsis like picture with Fever, Elevated WBC, and Hypotension in the ED and rehydrated and placed on IV Vanc/Zosyn in Ed. He was admitted for Syncope likely 2/2 to Dehydration.   Pt seen for MST. BMI indicates overweight status. Per chart review, pt consumed 90% of breakfast yesterday and no other intakes documented since admission. Pt states that he also ate a good dinner yesterday and that for breakfast this AM he had fruit, oatmeal, "and other stuff." Pt reports that appetite has increased since hospitalization. He is currently NPO pending CT abdomen and was drinking contrast during RD visit.   He states that PTA he had a very poor appetite x2-3 months. He denies abdominal pain, nausea, difficulties chewing or swallowing, or early satiety during this time frame. He states that he simply had no appetite and no desire to eat. When  he would eat, it would be maybe a meal a day or snacking. Pt denies abdominal pain/pressure or nausea with PO intakes since admission or with drinking contrast this afternoon.  Pt reports that over the past 2-3 months he has lost 30 lbs. Based on CBW, this indicates 13% body weight loss in this time frame which is significant. Limited recent weight hx in the chart but it does show 9 lb weight loss (4% body weight) from 10/16-11/07 which is significant for time frame.   Per notes, tentative plan to start chemo 11/16. RD will follow-up 11/17 and will adjust estimated nutrition needs at that time. Ensure Enlive BID has already been ordered per ONS protocol.   Medications reviewed; PRN Zofran, 1 tablet Senokot BID. Labs reviewed; Ca: 8.5 mg/dL.  IVF: NS @ 125 mL/hr.     Diet Order:  Diet Carb Modified Fluid consistency: Thin; Room service appropriate? Yes Diet NPO time specified Except for: Sips with Meds Diet NPO time specified Except for: Sips with Meds  Skin:  Reviewed, no issues  Last BM:  11/13  Height:   Ht Readings from Last 1 Encounters:  02/12/16 5' 11.5" (1.816 m)    Weight:   Wt Readings from Last 1 Encounters:  02/12/16 198 lb (89.8 kg)    Ideal Body Weight:  79.54 kg  BMI:  Body mass index is 27.23 kg/m.  Estimated Nutritional Needs:   Kcal:  [phone] (23-25kcal/kg)  Protein:  108-125 grams (1.2-1.4 grams/kg)  Fluid:  >/= 2 L/day  EDUCATION NEEDS:   No  education needs identified at this time    Samuel Matin, MS, RD, LDN Inpatient Clinical Dietitian Pager # [phone] After hours/weekend pager # [phone]

## 2016-02-14 NOTE — Progress Notes (Signed)
Patient ID: Samuel Berger, male   DOB: 10-21-1945, 70 y.o.   MRN: [id number]    Referring Physician(s): Heath Lark  Supervising Physician: Aletta Edouard  Patient Status:  [facility] - In-pt  Chief Complaint: Large B cell lymphoma  Subjective: Samuel Berger is a 70 y.o male with a history stroke, iliac artery aneurysm, diabetes, hypertension and hyperlipidemia. He is familiar with IR by left supraclavicular lymph node biopsy procedure done on 02/07/2016. Biopsy revealed a diffuse, large B-cell lymphoma and patient presents for port-a-cath placement for future chemotherapy treatments and poor venous access. IR has also been requested to perform a bone marrow biopsy to complete staging. Patient has no complaints and denies fever, headache, shortness of breath, chest pain, abdominal pain, back pain, nausea, vomiting, or bleeding.  Past Medical History:  Diagnosis Date  . Aneurysm artery, iliac ([place])   . Carotid artery occlusion   . Dyslipidemia   . GERD (gastroesophageal reflux disease)   . Hepatitis    ? age 76 or 51  . Hyperlipidemia   . Hypertension   . Iliac artery aneurysm, right ([place])   . Stroke ([place])    01-05-2012   [phone]  . Type II or unspecified type diabetes mellitus without mention of complication, not stated as uncontrolled    Past Surgical History:  Procedure Laterality Date  . basket procedure    . CAROTID ENDARTERECTOMY    . ENDARTERECTOMY  02/08/2012   Procedure: ENDARTERECTOMY CAROTID;  Surgeon: Mal Misty, MD;  Location: [facility];  Service: Vascular;  Laterality: Right;  . IR GENERIC HISTORICAL  02/07/2016   IR US GUIDE BX ASP/DRAIN 02/07/2016 Greggory Keen, MD [name]  . KIDNEY STONE SURGERY     removal  . PATCH ANGIOPLASTY  02/08/2012   Procedure: PATCH ANGIOPLASTY;  Surgeon: Mal Misty, MD;  Location: [facility];  Service: Vascular;  Laterality: Right;  with dacron patch angioplasty  . TEE WITHOUT CARDIOVERSION  01/08/2012   Procedure: TRANSESOPHAGEAL  ECHOCARDIOGRAM (TEE);  Surgeon: Lelon Perla, MD;  Location: [facility] ENDOSCOPY;  Service: Cardiovascular;  Laterality: N/A;  . TONSILLECTOMY       Allergies: Bee venom; Metformin and related; and Sulfa drugs cross reactors  Medications: Prior to Admission medications   Medication Sig Start Date End Date Taking? Authorizing Provider  amLODipine (NORVASC) 10 MG tablet Take 10 mg by mouth daily.   Yes Historical Provider, MD  atorvastatin (LIPITOR) 40 MG tablet Take 40 mg by mouth daily at 6 PM. 01/08/12  Yes Abelina Bachelor, PA-C  clopidogrel (PLAVIX) 75 MG tablet Take 75 mg by mouth daily with breakfast. 01/08/12  Yes Abelina Bachelor, PA-C  lisinopril (PRINIVIL,ZESTRIL) 5 MG tablet Take 5 mg by mouth daily.   Yes Historical Provider, MD  pioglitazone (ACTOS) 30 MG tablet Take 30 mg by mouth daily.   Yes Historical Provider, MD     Vital Signs: BP (!) 139/53 (BP Location: Left Arm)   Pulse 83   Temp 97.7 F (36.5 C) (Oral)   Resp 16   Ht 5' 11.5" (1.816 m)   Wt 198 lb (89.8 kg)   SpO2 94%   BMI 27.23 kg/m   Physical Exam Alert and oriented Pulm: CTAB CV: RRR, no MRG ABD: Soft, nontender, nondistended. Bowel sounds normal. Ext: no edema noted  Imaging: Dg Chest 2 View  Result Date: 02/12/2016 CLINICAL DATA:  Fever and weakness. EXAM: CHEST  2 VIEW COMPARISON:  01/27/2016 FINDINGS: The heart size and mediastinal  contours are within normal limits. Both lungs are clear. The visualized skeletal structures are unremarkable. IMPRESSION: No active cardiopulmonary disease. Electronically Signed   By: Kerby Moors M.D.   On: 02/12/2016 11:52    Labs:  CBC:  Recent Labs  02/07/16 1008 02/12/16 1120 02/13/16 0447 02/14/16 0420  WBC 10.4 15.1* 15.7* 13.4*  HGB 13.2 13.3 11.9* 10.9*  HCT 39.3 39.9 35.9* 31.7*  PLT 212 234 219 160    COAGS:  Recent Labs  02/07/16 1008  INR 1.06  APTT 31    BMP:  Recent Labs  03/01/15 1008 02/12/16 1120 02/13/16 0447 02/14/16 0420    NA  --  132* 133* 135  K  --  4.4 3.7 3.6  CL  --  97* 103 104  CO2  --  26 25 24   GLUCOSE  --  147* 118* 121*  BUN 19 14 11 12   CALCIUM  --  8.9 8.4* 8.5*  CREATININE 0.86 0.88 0.79 0.90  GFRNONAA  --  >60 >60 >60  GFRAA  --  >60 >60 >60    LIVER FUNCTION TESTS:  Recent Labs  02/12/16 1120 02/13/16 0447 02/14/16 0420  BILITOT 0.7 1.0 0.9  AST 20 17 18   ALT 16* 12* 12*  ALKPHOS 52 46 49  PROT 7.6 7.0 6.2*  ALBUMIN 3.4* 3.0* 2.8*    Assessment and Plan:  Samuel Berger is a 70 y.o male with a history stroke, iliac artery aneurysm, diabetes, hypertension and hyperlipidemia. He is familiar with IR by left supraclavicular lymph node biopsy procedure done on 02/07/2016. Biopsy revealed a diffuse, large B-cell lymphoma and patient presents for port-a-cath placement for future chemotherapy treatments and poor venous access. IR has also been requested to perform a bone marrow biopsy to complete staging.  Risks and benefits of port-a--cath placement discussed with the patient including, but not limited to bleeding, infection, pneumothorax, or fibrin sheath development and need for additional procedures. Risks and benefits of bone marrow biopsy discussed with the patient including, but not limited to bleeding, infection, damage to adjacent structures or low yield requiring additional tests. All of the patient's questions were answered, patient is agreeable to proceed. Consent signed and in chart. Port-a cath placement is currently expected for Thursday 02/16/2016 and biopsy is expected for Friday 02/17/2016 (IR MD not available to [facility] biopsy on 11/16)  Electronically Signed: D. Rowe Robert 02/14/2016, 2:29 PM   I spent a total of 20 minutes at the the patient's bedside AND on the patient's hospital floor or unit, greater than 50% of which was counseling/coordinating care for port-a-cath placement and bone marrow biopsy.

## 2016-02-14 NOTE — Progress Notes (Signed)
Samuel Berger   DOB:08/06/1945   MR#:[id number]    Subjective: The patient is seen in the room with his daughter Samuel Berger His appetite is mildly improved but he had an episode of nausea and vomiting Constipation has resolved He has been afebrile but remained with leukocytosis He complained of mild weakness  Objective:  Vitals:   02/14/16 0600 02/14/16 1446  BP: (!) 139/53 (!) 147/67  Pulse: 83 86  Resp: 16 18  Temp: 97.7 F (36.5 C) 98.1 F (36.7 C)     Intake/Output Summary (Last 24 hours) at 02/14/16 1529 Last data filed at 02/14/16 0300  Gross per 24 hour  Intake             2825 ml  Output                0 ml  Net             2825 ml    GENERAL:alert, no distress and comfortable SKIN: skin color, texture, turgor are normal, no rashes or significant lesions EYES: normal, Conjunctiva are pink and non-injected, sclera clear Musculoskeletal:no cyanosis of digits and no clubbing  NEURO: alert & oriented x 3 with fluent speech, no focal motor/sensory deficits   Labs:  Lab Results  Component Value Date   WBC 13.4 (H) 02/14/2016   HGB 10.9 (L) 02/14/2016   HCT 31.7 (L) 02/14/2016   MCV 87.3 02/14/2016   PLT 160 02/14/2016   NEUTROABS 9.4 (H) 02/14/2016    Lab Results  Component Value Date   NA 135 02/14/2016   K 3.6 02/14/2016   CL 104 02/14/2016   CO2 24 02/14/2016    Assessment & Plan:   Diffuse large B cell lymphoma, at least stage III Significant B symptoms with anorexia, weight loss and night sweats He needs urgent inpatient work-up. I recommend he stops Plavix in anticipation for port placement. The patient has poor venous access. We can try to get portplacement at the end of the week (potentially as outpatient) along with bone marrow biopsy to complete his staging I will get pathologist who ordered FISH analysisto exclude triple Hit Lymphoma, given high Ki-67 score Will get his case presented at the next Hematology tumor board We discussed briefly  about the approach for diffuse large B-cell lymphoma including the plan for urgent chemotherapy soon I gave him a copy of the current guidelines. I suspect he could have double hit or triple hit lymphoma; standard of care, especially given his weakness with the R-EP [facility] I would prefer to get everything done overnext few days with plan to starthim on treatment at the end of the week I will cancel Telemetry and transfer him to 3 west in preparation for chemotherapy to start in 2 days  Fever, leukocytosis Source is unknown. I suspect it could be due to tumor fever or abdominal process I plan repeat CT scan of the abdomen to exclude hydronephrosis or other causes for leukocytosis  Anorexia with weight loss Hyponatremia due to poor oral intake I will consult dietitian for review. Recommended frequent small meals Recommend IV fluids  Chronic constipation, resolved We discussed management with laxative, increase fiber intake and hydration  History of stroke without residual deficit I recommend stopping Plavix and substitute with 81 mg aspirin. With his recent near-syncopal episode, I suspect is due to hypotension due to poor oral intake and dehydration. I will stop lisinopril as well  Weakness with near syncopal episode  Consult PT for safety evaluation     Discharge planning  Unknown. He may need to be started on treatment while hospitalized  Heath Lark, MD 02/14/2016  3:29 PM

## 2016-02-15 DIAGNOSIS — R55 Syncope and collapse: Secondary | ICD-10-CM

## 2016-02-15 DIAGNOSIS — C8330 Diffuse large B-cell lymphoma, unspecified site: Principal | ICD-10-CM

## 2016-02-15 DIAGNOSIS — E871 Hypo-osmolality and hyponatremia: Secondary | ICD-10-CM

## 2016-02-15 DIAGNOSIS — E43 Unspecified severe protein-calorie malnutrition: Secondary | ICD-10-CM

## 2016-02-15 LAB — COMPREHENSIVE METABOLIC PANEL
ALT: 13 U/L — ABNORMAL LOW (ref 17–63)
AST: 19 U/L (ref 15–41)
Albumin: 2.6 g/dL — ABNORMAL LOW (ref 3.5–5.0)
Alkaline Phosphatase: 59 U/L (ref 38–126)
Anion gap: 7 (ref 5–15)
BUN: 10 mg/dL (ref 6–20)
CHLORIDE: 100 mmol/L — AB (ref 101–111)
CO2: 27 mmol/L (ref 22–32)
Calcium: 8.5 mg/dL — ABNORMAL LOW (ref 8.9–10.3)
Creatinine, Ser: 1.06 mg/dL (ref 0.61–1.24)
Glucose, Bld: 111 mg/dL — ABNORMAL HIGH (ref 65–99)
POTASSIUM: 3.6 mmol/L (ref 3.5–5.1)
SODIUM: 134 mmol/L — AB (ref 135–145)
Total Bilirubin: 0.8 mg/dL (ref 0.3–1.2)
Total Protein: 6.5 g/dL (ref 6.5–8.1)

## 2016-02-15 LAB — CBC WITH DIFFERENTIAL/PLATELET
BASOS ABS: 0 10*3/uL (ref 0.0–0.1)
Basophils Relative: 0 %
EOS ABS: 0.3 10*3/uL (ref 0.0–0.7)
EOS PCT: 2 %
HCT: 33.2 % — ABNORMAL LOW (ref 39.0–52.0)
Hemoglobin: 11.2 g/dL — ABNORMAL LOW (ref 13.0–17.0)
LYMPHS ABS: 1.2 10*3/uL (ref 0.7–4.0)
LYMPHS PCT: 11 %
MCH: 29 pg (ref 26.0–34.0)
MCHC: 33.7 g/dL (ref 30.0–36.0)
MCV: 86 fL (ref 78.0–100.0)
MONO ABS: 1.8 10*3/uL — AB (ref 0.1–1.0)
Monocytes Relative: 16 %
Neutro Abs: 8.2 10*3/uL — ABNORMAL HIGH (ref 1.7–7.7)
Neutrophils Relative %: 71 %
PLATELETS: 227 10*3/uL (ref 150–400)
RBC: 3.86 MIL/uL — AB (ref 4.22–5.81)
RDW: 14.6 % (ref 11.5–15.5)
WBC: 11.5 10*3/uL — AB (ref 4.0–10.5)

## 2016-02-15 LAB — MAGNESIUM: MAGNESIUM: 1.8 mg/dL (ref 1.7–2.4)

## 2016-02-15 LAB — VANCOMYCIN, TROUGH: VANCOMYCIN TR: 23 ug/mL — AB (ref 15–20)

## 2016-02-15 LAB — PHOSPHORUS: PHOSPHORUS: 2.6 mg/dL (ref 2.5–4.6)

## 2016-02-15 MED ORDER — VANCOMYCIN HCL IN DEXTROSE 1-5 GM/200ML-% IV SOLN: 1000.0000 mg | Freq: Two times a day (BID) | INTRAVENOUS | Status: DC

## 2016-02-15 NOTE — Progress Notes (Signed)
CRITICAL VALUE ALERT  Critical value received:  Vancomycin trough of 23  Date of notification:  02/15/16  Time of notification:  [id number]  Critical value read back:Yes.    Nurse who received alert:  Sandie Ano  MD notified (1st page): Dr. Dyann Kief  Time of first page:  1125  MD notified (2nd page):  Time of second page:  Responding MD:  Dr. Dyann Kief  Time MD responded:  1125

## 2016-02-15 NOTE — Progress Notes (Signed)
Samuel Berger   DOB:Dec 17, 1945   KZ#:[id number]    Subjective: He feels slightly better. He denies pain. He is afebrile and leukocytosis is improving. Cultures so far is negative  Objective:  Vitals:   02/14/16 2048 02/15/16 0521  BP: 139/69 (!) 140/59  Pulse: 94 96  Resp: 20 20  Temp: 99 F (37.2 C) 99 F (37.2 C)     Intake/Output Summary (Last 24 hours) at 02/15/16 1702 Last data filed at 02/15/16 1518  Gross per 24 hour  Intake           2251.5 ml  Output                0 ml  Net           2251.5 ml    GENERAL:alert, no distress and comfortable SKIN: skin color, texture, turgor are normal, no rashes or significant lesions EYES: normal, Conjunctiva are pink and non-injected, sclera clear Musculoskeletal:no cyanosis of digits and no clubbing  NEURO: alert & oriented x 3 with fluent speech, no focal motor/sensory deficits   Labs:  Lab Results  Component Value Date   WBC 11.5 (H) 02/15/2016   HGB 11.2 (L) 02/15/2016   HCT 33.2 (L) 02/15/2016   MCV 86.0 02/15/2016   PLT 227 02/15/2016   NEUTROABS 8.2 (H) 02/15/2016    Lab Results  Component Value Date   NA 134 (L) 02/15/2016   K 3.6 02/15/2016   CL 100 (L) 02/15/2016   CO2 27 02/15/2016    Studies: I reviewed the CT scan with him and his wife Ct Abdomen Pelvis W Contrast  Result Date: 02/14/2016 CLINICAL DATA:  Syncope, dehydration, sepsis. Diffuse large B-cell lymphoma. EXAM: CT ABDOMEN AND PELVIS WITH CONTRAST TECHNIQUE: Multidetector CT imaging of the abdomen and pelvis was performed using the standard protocol following bolus administration of intravenous contrast. CONTRAST:  130m ISOVUE-300 IOPAMIDOL (ISOVUE-300) INJECTION 61% COMPARISON:  01/06/2016 FINDINGS: Lower chest: Small right pleural effusion. Hepatobiliary: Liver is within normal limits. Tiny layering gallstones (series 2/ image 20), without associated inflammatory changes. No intrahepatic extrahepatic ductal dilatation. Pancreas: Within normal  limits. Spleen: Spleen is normal in size. Adrenals/Urinary Tract: Adrenal glands are within normal limits. 2.6 cm medial right upper pole renal cyst (series 2/ image 25). 9 mm anterior right lower pole renal cyst (series 2/ image 36). Mild diminished/delayed enhancement of the right kidney. Associated mild right hydronephrosis, new, likely secondary to extrinsic compression from right retroperitoneal lymphadenopathy. Mildly thick-walled bladder. Stomach/Bowel: Stomach is within normal limits. No evidence of bowel obstruction. Appendix is not discretely visualized. Sigmoid diverticulosis, without evidence of diverticulitis. Vascular/Lymphatic: No evidence of abdominal aortic aneurysm. Atherosclerotic calcifications of the abdominal aorta and branch vessels. Widespread abdominopelvic lymphadenopathy, including: --14 mm short axis right retrocrural node (series 2/ image 15), previously 17 mm --19 mm short axis left para-aortic node (series 2/ image 37), previously 27 mm, no longer necrotic --16 mm short axis aortocaval node (series 2/image 45), previously 18 mm, now necrotic --22 mm short axis left common iliac node (series 2/ image 55), previously 25 mm, now with increased necrosis --33 mm short axis right common iliac node (series 2/image 58), previously 24 mm, now necrotic --21 mm short axis right external iliac node (series 2/ image 69), previously 33 mm, no longer necrotic --29 mm short axis left obturator node (series 2/ image 35), previously 23 mm Reproductive: Prostate is grossly unremarkable. Other: Small volume pelvic ascites (series 2/image 76), increased. Mild stranding along the  right pericolic gutter (series 2/ image 51). Moderate fat containing left inguinal hernia, now with trace fluid inferiorly (series 2/ image 89). Musculoskeletal: Degenerative changes of the visualized thoracolumbar spine. IMPRESSION: Widespread retrocrural, retroperitoneal, and bilateral pelvic lymphadenopathy, waxing/waning when  compared to recent CT, as above. Mild right hydronephrosis, likely secondary to extrinsic compression by right retroperitoneal lymphadenopathy, with associated diminished enhancement of the right kidney. Spleen is normal in size. Additional ancillary findings as above. Electronically Signed   By: Julian Hy M.D.   On: 02/14/2016 17:35    Assessment & Plan:   Diffuse large B cell lymphoma, at least stage III Significant B symptoms with anorexia, weight loss and night sweats He needs urgent inpatient work-up. I recommend he stops Plavix in anticipation for port placement. The patient has poor venous access. We can try to get portplacement at the end of the week (potentially as outpatient) along with bone marrow biopsy to complete his staging Unfortunately, bone marrow biopsy will not be done until Friday. I will try to do unsedated bone marrow biopsy by myself tomorrow will plan to start him on chemotherapy after port placement.  The pathologist has ordered [place] analysisto exclude triple Hit Lymphoma, given high Ki-67 score Will get his case presented at the next Hematology tumor board We discussed briefly about the approach for diffuse large B-cell lymphoma including the plan for urgent chemotherapy soon I gave him a copy of the current guidelines. I suspect he could have double hit or triple hit lymphoma; standard of care, especially given his weakness with the R-EP OCH I would prefer to get everything done tomorrow with plan to starthim on treatment tomorrow We discussed the role of chemotherapy. The intent is for cure. It is in accordance to recommendation from NCCN. We discussed some of the risks, benefits and side-effects of Rituximab, etoposide, Cytoxan, Adriamycin,Vincristine and Solumedrol/Prednisone.   Some of the short term side-effects included, though not limited to, risk of fatigue, weight loss, tumor lysis syndrome, risk of allergic reactions, pancytopenia, life-threatening  infections, need for transfusions of blood products, nausea, vomiting, change in bowel habits, hair loss, risk of congestive heart failure, admission to hospital for various reasons, and risks of death.   Long term side-effects are also discussed including permanent damage to nerve function, chronic fatigue, and rare secondary malignancy including bone marrow disorders.   The patient is aware that the response rates discussed earlier is not guaranteed.    After a long discussion, patient made an informed decision to proceed with the prescribed plan of care and went ahead to sign the consent form today.   Patient education material was dispensed  To reduce his risk of allergic reaction to rituximab, I plan to start his rituximab treatment on Friday and will cap the maximum infusion rate at 50 mls per hour We will monitor his blood work for tumor lysis syndrome daily after discharge of treatment  Fever, leukocytosis Source is unknown, but with the hydronephrosis seen on the right kidney on CT scan, I suspect this is likely due to possible pyelonephritis I will discontinue vancomycin. I will continue IV Zosyn 1 more day until after port placement  Anorexia with weight loss, protein calorie malnutrition Hyponatremia due to poor oral intake I will consult dietitian for review. Recommended frequent small meals Recommend IV fluids  Chronic constipation, resolved We discussed management with laxative, increase fiber intake and hydration  History of stroke without residual deficit I recommend stopping Plavix and substitute with 81 mg aspirin.  With his recent near-syncopal episode, I suspect is due to hypotension due to poor oral intake and dehydration. I will stop lisinopril as well  Weakness with near syncopal episode  Consult PT for safety evaluation   Discharge planning  Unknown. Likely next week after chemotherapy is completed   Heath Lark, MD 02/15/2016  5:02 PM

## 2016-02-15 NOTE — Progress Notes (Signed)
Pharmacy Antibiotic Note  Samuel Berger is a 70 y.o. male with newly diagnosed diffuse large B cell lymphoma, presented to the ED on 02/13/16 with c/o of syncope. Vancomycin 1 gm and zosyn 3.375 gm IV x1 given in the ED for suspected sepsis.  Patient remains febrile with elevated WBC.  To resume vancomycin and zosyn.  02/15/2016  Vanc trough = 23 Scr= 1.06 WBC improving  Plan: - change vanc to 1gm IV q12h  - continue zosyn 3.375 gm IV q8h (infuse over 4 hours) - f/u cultures, renal funct  __________________________  Height: 5\' 11"  (180.3 cm) Weight: 198 lb (89.8 kg) IBW/kg (Calculated) : 75.3  Temp (24hrs), Avg:98.7 F (37.1 C), Min:98.1 F (36.7 C), Max:99 F (37.2 C)   Recent Labs Lab 02/12/16 1120 02/12/16 1133 02/13/16 0447 02/14/16 0420 02/15/16 0428 02/15/16 1040  WBC 15.1*  --  15.7* 13.4* 11.5*  --   CREATININE 0.88  --  0.79 0.90 1.06  --   LATICACIDVEN  --  1.85  --   --   --   --   VANCOTROUGH  --   --   --   --   --  23*    Estimated Creatinine Clearance: 68.1 mL/min (by C-G formula based on SCr of 1.06 mg/dL).    Allergies  Allergen Reactions  . Bee Venom Hives  . Metformin And Related     dizziness  . Sulfa Drugs Cross Reactors Other (See Comments)    Unknown    Antimicrobials this admission:  11/12 zosyn /vanc x1 11/13 vanc>> 11/13 zosyn>>   Dose adjustments this admission:  11/15 VT @ 1040 = 23  Microbiology results:  11/12 BCx x2:ngtd 11/12 UCx:  neg 11/12 UA (-)  Thank you for allowing pharmacy to be a part of this patient's care.  Dolly Rias RPh 02/15/2016, 11:29 AM Pager [phone]

## 2016-02-15 NOTE — Progress Notes (Signed)
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 He was found to have a sepsis like picture with Fever, Elevated WBC, and Hypotension in the ED and rehydrated and placed on IV Vanc/Zosyn in Ed. He was admitted for Syncope likely 2/2 to Dehydration. He is being worked up for His Lymphoma and per Oncology will get a Port on Thursday, Bone Marrow Biopsy, and start Chemotherapy.   Assessment & Plan:   Active Problems:   Large B-cell lymphoma (HCC)   Syncope   Diffuse large B-cell lymphoma (HCC)   Hyponatremia   Protein-calorie malnutrition, severe  Syncope -No recurrent episodes -Probably 2/2 Volume depletion Because of poor by mouth intake -continue holding Lisinopril 5 mg as well as Amlodipine 10 mg; BP is well controlled w/o meds -Continue with IVF Rehydration at 90 mL/hr -PT/OT has evaluated patient and recommended no follow up. -Repeat Orthostatics stable and WNL currently -will continue supportive care   Diffuse Large B-cell Lymphoma with type B symptoms -Dr. Alvy Bimler in Oncology Following and appreciate Recc's -Workup in Progress and Port Placement Thursday (02/16/2016) as Cancer is fairly aggressive. Dr. 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 Alvy Bimler and CT demonstrated diffuse  lymphadenopathy and mild hydronephrosis from lymph node obstruction. -Continue ensure Enlive 237 mL po BID  Diabetes Mellitus Type 2 -Hold off on Actos for now -continue SSI while inpatient  -patient encourage to eat and maintain good hydration   Prior CVA + Endarterectomy  -Continue Atorvastatin 40 mg and hold Plavix 75 daily in anticipation for a Port-a-cath placement -Asprin 81 mg Substituted for possible port placement on 11/16  SIRS with Unclear Source of Infection -Patient was Febrile and had elevated WBC -Has been afebrile now;  -will continue Acetaminophen 500 mg po q6hprn for Temp > 100.5 -Leukocytosis improving and went from 15.7 -> 11.5 -CT of Abdomen and Pelvis with Contrast demonstrated no source of infection -Possibly from Lymphoma -will continue supportive care, will stop vancomycin and will continue zosyn for 1 more day  Hyponatremia, improved -Na+ went from 132 -> 133 -> 134 -will continue with IVF Rehydration at 90 mL/hr -patient now eating and drinking better  DVT prophylaxis: Lovenox 40 mg Code Status: FULL Family Communication: Discussed with Patient's Daughter at bedside Disposition Plan: Likely home next week. Portable catheter on 11/16, bone marrow biopsy on 11/17 and initiation of chemotherapy during this admission. Will follow rec's from oncology   Consultants:   Hematology/Oncology Dr. Heath Lark  IR  Procedures:  -Portable cath on 11/16 -Bone marrow biopsy 11/17  Antimicrobials: IV Vancomycin and IV Zosyn  Subjective: Patient is afebrile, feeling better, in no distress and denying CP, nausea, vomiting and abd pain.  Objective: Vitals:   02/14/16 1446 02/14/16 1638 02/14/16 2048 02/15/16 0521  BP: (!) 147/67 (!) 143/72 139/69 (!) 140/59  Pulse:  86 95 94 96  Resp: _0 Temp: 98.1 F (36.7 C) 98.8 F (37.1 C) 99 F (37.2 C) 99 F (37.2 C)  TempSrc: Oral Oral Oral Oral  SpO2: 99% 97% 97% 93%  Weight:  89.8 kg (198 lb)      Height:  _1  (1.803 m)      Intake/Output Summary (Last 24 hours) at 02/15/16 1730 Last data filed at 02/15/16 1518  Gross per 24 hour  Intake           2251.5 ml  Output                0 ml  Net           2251.5 ml   Filed Weights   02/12/16 1600 02/14/16 1638  Weight: 89.8 kg (198 lb) 89.8 kg (198 lb)    Examination: Constitutional:  NAD and appears calm and comfortable having lunch Eyes: Lids and conjunctivae normal, no icterus Neck: Appears normal, supple, no cervical masses, normal ROM, no appreciable thyromegaly Respiratory: Clear to auscultation bilaterally, no wheezing, rales, rhonchi or crackles. Normal respiratory effort and patient is not tachypenic. No accessory muscle use.  Cardiovascular: RRR, no murmurs / rubs / gallops. S1 and S2 auscultated.  Abdomen: Soft, non-tender, non-distended. No masses palpated. No appreciable hepatosplenomegaly. Bowel sounds positive x4.  Musculoskeletal: No clubbing / cyanosis of digits/nails. No joint deformity upper and lower extremities.  Skin: No rashes, lesions, ulcers. No induration; Warm and dry.  Neurologic: CN 2-12 grossly intact with no focal deficits. Sensation intact in all 4 Extremities, Romberg sign cerebellar reflexes not assessed.  Psychiatric: Normal judgment and insight. Alert and oriented x 3. Normal mood and appropriate affect.   Data Reviewed: I have personally reviewed following labs and imaging studies  CBC:  Recent Labs Lab 02/12/16 1120 02/13/16 0447 02/14/16 0420 02/15/16 0428  WBC 15.1* 15.7* 13.4* 11.5*  NEUTROABS 11.0*  --  9.4* 8.2*  HGB 13.3 11.9* 10.9* 11.2*  HCT 39.9 35.9* 31.7* 33.2*  MCV 88.3 87.1 87.3 86.0  PLT 234 219 160 403   Basic Metabolic Panel:  Recent Labs Lab 02/12/16 1120 02/13/16 0447 02/14/16 0420 02/15/16 0428  NA 132* 133* 135 134*  K 4.4 3.7 3.6 3.6  CL 97* 103 104 100*  CO2 _2 GLUCOSE 147* 118* 121* 111*  BUN _3 CREATININE 0.88 0.79 0.90  1.06  CALCIUM 8.9 8.4* 8.5* 8.5*  MG  --   --  1.9 1.8  PHOS  --   --  3.5 2.6   GFR: Estimated Creatinine Clearance: 68.1 mL/min (by C-G formula based on SCr of 1.06 mg/dL).   Liver Function Tests:  Recent Labs Lab 02/12/16 1120 02/13/16 0447 02/14/16 0420 02/15/16 0428  AST _4 ALT 16* 12* 12* 13*  ALKPHOS 52 46 49 59  BILITOT 0.7 1.0 0.9 0.8  PROT 7.6 7.0 6.2* 6.5  ALBUMIN 3.4* 3.0* 2.8* 2.6*   CBG:  Recent Labs Lab 02/12/16 2103  GLUCAP 123*   Sepsis Labs:  Recent Labs Lab 02/12/16 1133  LATICACIDVEN 1.85    Recent Results (from the past 240 hour(s))  Blood Culture (routine x 2)     Status: None (Preliminary result)   Collection Time: 02/12/16 11:21 AM  Result Value Ref Range Status   Specimen Description BLOOD RIGHT [facility]  Final   Special Requests BOTTLES DRAWN AEROBIC AND ANAEROBIC 5CC  Final   Culture   Final    NO GROWTH 3 DAYS Performed at [facility]    Report Status PENDING  Incomplete  Blood Culture (routine x 2)     Status: None (Preliminary result)   Collection Time: 02/12/16 11:57 AM  Result Value Ref Range Status   Specimen Description BLOOD LEFT HAND  Final   Special Requests IN PEDIATRIC BOTTLE [place]  Final   Culture   Final    NO GROWTH 3 DAYS Performed at [facility]    Report Status PENDING  Incomplete  Urine culture     Status: None   Collection Time: 02/12/16  1:11 PM  Result Value Ref Range Status   Specimen Description URINE, CLEAN CATCH  Final   Special Requests NONE  Final   Culture NO GROWTH Performed at [facility]   Final   Report Status 02/13/2016 FINAL  Final    Radiology Studies: Ct Abdomen Pelvis W Contrast  Result Date: 02/14/2016 CLINICAL DATA:  Syncope, dehydration, sepsis. Diffuse large B-cell lymphoma. EXAM: CT ABDOMEN AND PELVIS WITH CONTRAST TECHNIQUE: Multidetector CT imaging of the abdomen and pelvis was performed using the standard protocol following bolus administration of  intravenous contrast. CONTRAST:  131m ISOVUE-300 IOPAMIDOL (ISOVUE-300) INJECTION 61% COMPARISON:  01/06/2016 FINDINGS: Lower chest: Small right pleural effusion. Hepatobiliary: Liver is within normal limits. Tiny layering gallstones (series 2/ image 20), without associated inflammatory changes. No intrahepatic extrahepatic ductal dilatation. Pancreas: Within normal limits. Spleen: Spleen is normal in size. Adrenals/Urinary Tract: Adrenal glands are within normal limits. 2.6 cm medial right upper pole renal cyst (series 2/ image 25). 9 mm anterior right lower pole renal cyst (series 2/ image 36). Mild diminished/delayed enhancement of the right kidney. Associated mild right hydronephrosis, new, likely secondary to extrinsic compression from right retroperitoneal lymphadenopathy. Mildly thick-walled bladder. Stomach/Bowel: Stomach is within normal limits. No evidence of bowel obstruction. Appendix is not discretely visualized. Sigmoid diverticulosis, without evidence of diverticulitis. Vascular/Lymphatic: No evidence of abdominal aortic aneurysm. Atherosclerotic calcifications of the abdominal aorta and branch vessels. Widespread abdominopelvic lymphadenopathy, including: --14 mm short axis right retrocrural node (series 2/ image 15), previously 17 mm --19 mm short axis left para-aortic node (series 2/ image 37), previously 27 mm, no longer necrotic --16 mm short axis aortocaval node (series 2/image 45), previously 18 mm, now necrotic --22 mm short axis left common iliac node (series 2/ image 55), previously 25 mm, now with increased necrosis --33 mm short axis right common iliac node (series 2/image 58), previously 24 mm, now necrotic --21 mm short axis right external iliac node (series 2/ image 69), previously 33 mm, no longer necrotic --29 mm short axis left obturator node (series 2/ image 35), previously 23 mm Reproductive: Prostate is grossly unremarkable. Other: Small volume pelvic ascites (series 2/image 76),  increased. Mild stranding along the right pericolic gutter (series 2/ image 51). Moderate fat containing left inguinal hernia, now with trace fluid inferiorly (series 2/ image 89). Musculoskeletal: Degenerative changes of the visualized thoracolumbar spine. IMPRESSION: Widespread retrocrural, retroperitoneal, and bilateral pelvic lymphadenopathy, waxing/waning when compared to recent CT, as above. Mild right hydronephrosis, likely secondary to extrinsic compression by right retroperitoneal lymphadenopathy, with associated diminished enhancement of the right kidney. Spleen is normal in size. Additional ancillary findings as above. Electronically Signed   By: SJulian HyM.D.   On: 02/14/2016 17:35   Scheduled Meds: . aspirin EC  81 mg Oral Daily  . atorvastatin  40 mg Oral  q1800  . enoxaparin (LOVENOX) injection  40 mg Subcutaneous Q24H  . feeding supplement (ENSURE ENLIVE)  237 mL Oral BID BM  . piperacillin-tazobactam (ZOSYN)  IV  3.375 g Intravenous Q8H  . senna-docusate  1 tablet Oral BID  . sodium chloride flush  3 mL Intravenous Q12H   Continuous Infusions: . sodium chloride 1,000 mL (02/15/16 1048)    LOS: 3 days   Barton Dubois, MD [facility] Pager [phone]  If 7PM-7AM, please contact night-coverage [email or web address] Password [email or web address] 02/15/2016, 5:30 PM

## 2016-02-16 ENCOUNTER — Inpatient Hospital Stay (HOSPITAL_COMMUNITY): Payer: Medicare Other

## 2016-02-16 ENCOUNTER — Other Ambulatory Visit: Payer: Self-pay | Admitting: Hematology and Oncology

## 2016-02-16 ENCOUNTER — Telehealth: Payer: Self-pay | Admitting: *Deleted

## 2016-02-16 ENCOUNTER — Encounter (HOSPITAL_COMMUNITY): Payer: Self-pay | Admitting: Interventional Radiology

## 2016-02-16 DIAGNOSIS — C8338 Diffuse large B-cell lymphoma, lymph nodes of multiple sites: Secondary | ICD-10-CM

## 2016-02-16 DIAGNOSIS — I10 Essential (primary) hypertension: Secondary | ICD-10-CM

## 2016-02-16 DIAGNOSIS — R531 Weakness: Secondary | ICD-10-CM | POA: Diagnosis not present

## 2016-02-16 DIAGNOSIS — C8339 Diffuse large B-cell lymphoma, extranodal and solid organ sites: Secondary | ICD-10-CM | POA: Diagnosis not present

## 2016-02-16 DIAGNOSIS — C8510 Unspecified B-cell lymphoma, unspecified site: Secondary | ICD-10-CM

## 2016-02-16 DIAGNOSIS — E860 Dehydration: Secondary | ICD-10-CM | POA: Diagnosis not present

## 2016-02-16 DIAGNOSIS — K5909 Other constipation: Secondary | ICD-10-CM | POA: Diagnosis not present

## 2016-02-16 DIAGNOSIS — E871 Hypo-osmolality and hyponatremia: Secondary | ICD-10-CM | POA: Diagnosis not present

## 2016-02-16 DIAGNOSIS — R634 Abnormal weight loss: Secondary | ICD-10-CM | POA: Diagnosis not present

## 2016-02-16 HISTORY — PX: IR GENERIC HISTORICAL: IMG1180011

## 2016-02-16 LAB — BASIC METABOLIC PANEL
Anion gap: 10 (ref 5–15)
BUN: 9 mg/dL (ref 6–20)
CHLORIDE: 100 mmol/L — AB (ref 101–111)
CO2: 26 mmol/L (ref 22–32)
CREATININE: 0.93 mg/dL (ref 0.61–1.24)
Calcium: 8.3 mg/dL — ABNORMAL LOW (ref 8.9–10.3)
GFR calc Af Amer: >60 mL/min (ref >60)
GFR calc non Af Amer: >60 mL/min (ref >60)
Glucose, Bld: 91 mg/dL (ref 65–99)
POTASSIUM: 3.2 mmol/L — AB (ref 3.5–5.1)
Sodium: 136 mmol/L (ref 135–145)

## 2016-02-16 LAB — CBC WITH DIFFERENTIAL/PLATELET
Basophils Absolute: 0.1 10*3/uL (ref 0.0–0.1)
Basophils Relative: 1 %
EOS ABS: 0.3 10*3/uL (ref 0.0–0.7)
Eosinophils Relative: 4 %
HEMATOCRIT: 32.3 % — AB (ref 39.0–52.0)
HEMOGLOBIN: 11.2 g/dL — AB (ref 13.0–17.0)
LYMPHS PCT: 15 %
Lymphs Abs: 1.2 10*3/uL (ref 0.7–4.0)
MCH: 29.6 pg (ref 26.0–34.0)
MCHC: 34.7 g/dL (ref 30.0–36.0)
MCV: 85.2 fL (ref 78.0–100.0)
MONOS PCT: 19 %
Monocytes Absolute: 1.6 10*3/uL — ABNORMAL HIGH (ref 0.1–1.0)
NEUTROS ABS: 5.1 10*3/uL (ref 1.7–7.7)
NEUTROS PCT: 61 %
Platelets: 225 10*3/uL (ref 150–400)
RBC: 3.79 MIL/uL — AB (ref 4.22–5.81)
RDW: 14.4 % (ref 11.5–15.5)
WBC: 8.3 10*3/uL (ref 4.0–10.5)

## 2016-02-16 LAB — BONE MARROW EXAM

## 2016-02-16 MED ORDER — HYDRALAZINE HCL 50 MG PO TABS: 50.0000 mg | ORAL_TABLET | Freq: Four times a day (QID) | ORAL | Status: DC | PRN

## 2016-02-16 MED ORDER — ACETAMINOPHEN 325 MG PO TABS
650.0000 mg | ORAL_TABLET | Freq: Once | ORAL | Status: AC
  Administered 2016-02-16: 650 mg via ORAL
  Filled 2016-02-16: qty 2

## 2016-02-16 MED ORDER — SODIUM CHLORIDE 0.9 % IV SOLN
375.0000 mg/m2 | Freq: Once | INTRAVENOUS | Status: AC
  Administered 2016-02-16: 800 mg via INTRAVENOUS
  Filled 2016-02-16: qty 50

## 2016-02-16 MED ORDER — EPINEPHRINE PF 1 MG/ML IJ SOLN
0.5000 mg | Freq: Once | INTRAMUSCULAR | Status: DC | PRN
  Filled 2016-02-16: qty 1

## 2016-02-16 MED ORDER — EPINEPHRINE PF 1 MG/10ML IJ SOSY
0.2500 mg | PREFILLED_SYRINGE | Freq: Once | INTRAMUSCULAR | Status: DC | PRN
  Filled 2016-02-16: qty 10

## 2016-02-16 MED ORDER — DEXAMETHASONE SODIUM PHOSPHATE 4 MG/ML IJ SOLN
20.0000 mg | Freq: Once | INTRAMUSCULAR | Status: DC
  Filled 2016-02-16 (×2): qty 5

## 2016-02-16 MED ORDER — HEPARIN SOD (PORK) LOCK FLUSH 100 UNIT/ML IV SOLN
500.0000 [IU] | Freq: Once | INTRAVENOUS | Status: DC | PRN
  Filled 2016-02-16: qty 5

## 2016-02-16 MED ORDER — MIDAZOLAM HCL 2 MG/2ML IJ SOLN
INTRAMUSCULAR | Status: AC
  Filled 2016-02-16: qty 6

## 2016-02-16 MED ORDER — METHYLPREDNISOLONE SODIUM SUCC 125 MG IJ SOLR: 125.0000 mg | Freq: Once | INTRAMUSCULAR | Status: DC | PRN

## 2016-02-16 MED ORDER — MIDAZOLAM HCL 2 MG/2ML IJ SOLN
INTRAMUSCULAR | Status: AC | PRN
  Administered 2016-02-16 (×5): 1 mg via INTRAVENOUS

## 2016-02-16 MED ORDER — ALTEPLASE 2 MG IJ SOLR
2.0000 mg | Freq: Once | INTRAMUSCULAR | Status: DC | PRN
  Filled 2016-02-16: qty 2

## 2016-02-16 MED ORDER — HEPARIN SOD (PORK) LOCK FLUSH 100 UNIT/ML IV SOLN: 250.0000 [IU] | Freq: Once | INTRAVENOUS | Status: DC | PRN

## 2016-02-16 MED ORDER — FENTANYL CITRATE (PF) 100 MCG/2ML IJ SOLN
INTRAMUSCULAR | Status: AC | PRN
  Administered 2016-02-16: 25 ug via INTRAVENOUS
  Administered 2016-02-16: 50 ug via INTRAVENOUS

## 2016-02-16 MED ORDER — DIPHENHYDRAMINE HCL 50 MG PO CAPS
50.0000 mg | ORAL_CAPSULE | Freq: Once | ORAL | Status: AC
  Administered 2016-02-16: 50 mg via ORAL
  Filled 2016-02-16: qty 1

## 2016-02-16 MED ORDER — LIDOCAINE HCL 1 % IJ SOLN
INTRAMUSCULAR | Status: AC
  Filled 2016-02-16: qty 20

## 2016-02-16 MED ORDER — FAMOTIDINE IN NACL 20-0.9 MG/50ML-% IV SOLN
20.0000 mg | Freq: Once | INTRAVENOUS | Status: DC | PRN
  Filled 2016-02-16: qty 50

## 2016-02-16 MED ORDER — FENTANYL CITRATE (PF) 100 MCG/2ML IJ SOLN
INTRAMUSCULAR | Status: AC
  Filled 2016-02-16: qty 4

## 2016-02-16 MED ORDER — ALBUTEROL SULFATE (2.5 MG/3ML) 0.083% IN NEBU: 2.5000 mg | INHALATION_SOLUTION | Freq: Once | RESPIRATORY_TRACT | Status: DC | PRN

## 2016-02-16 MED ORDER — SODIUM CHLORIDE 0.9 % IV SOLN: Freq: Once | INTRAVENOUS | Status: DC | PRN

## 2016-02-16 MED ORDER — SODIUM CHLORIDE 0.9 % IV SOLN
20.0000 mg | Freq: Once | INTRAVENOUS | Status: AC
  Administered 2016-02-16: 20 mg via INTRAVENOUS
  Filled 2016-02-16: qty 2

## 2016-02-16 MED ORDER — AMOXICILLIN-POT CLAVULANATE 875-125 MG PO TABS
1.0000 | ORAL_TABLET | Freq: Two times a day (BID) | ORAL | Status: AC
  Administered 2016-02-16 – 2016-02-18 (×6): 1 via ORAL
  Filled 2016-02-16 (×6): qty 1

## 2016-02-16 MED ORDER — SODIUM CHLORIDE 0.9% FLUSH: 3.0000 mL | INTRAVENOUS | Status: DC | PRN

## 2016-02-16 MED ORDER — SODIUM CHLORIDE 0.9% FLUSH: 10.0000 mL | INTRAVENOUS | Status: DC | PRN

## 2016-02-16 MED ORDER — AMLODIPINE BESYLATE 5 MG PO TABS
5.0000 mg | ORAL_TABLET | Freq: Every day | ORAL | Status: DC
  Administered 2016-02-16 – 2016-02-21 (×6): 5 mg via ORAL
  Filled 2016-02-16 (×6): qty 1

## 2016-02-16 MED ORDER — SODIUM CHLORIDE 0.9 % IV SOLN
Freq: Once | INTRAVENOUS | Status: AC
  Administered 2016-02-16: 10:00:00 via INTRAVENOUS

## 2016-02-16 MED ORDER — DIPHENHYDRAMINE HCL 50 MG/ML IJ SOLN: 50.0000 mg | Freq: Once | INTRAMUSCULAR | Status: DC | PRN

## 2016-02-16 MED ORDER — DIPHENHYDRAMINE HCL 50 MG/ML IJ SOLN: 25.0000 mg | Freq: Once | INTRAMUSCULAR | Status: DC | PRN

## 2016-02-16 MED ORDER — CEFAZOLIN SODIUM-DEXTROSE 2-4 GM/100ML-% IV SOLN
2.0000 g | INTRAVENOUS | Status: AC
  Administered 2016-02-16: 2 g via INTRAVENOUS
  Filled 2016-02-16: qty 100

## 2016-02-16 MED ORDER — LIDOCAINE HCL 1 % IJ SOLN
INTRAMUSCULAR | Status: AC | PRN
  Administered 2016-02-16: 10 mL via INTRADERMAL

## 2016-02-16 NOTE — Progress Notes (Signed)
Chemo consent signed by patient for Rituximab, doxorubicin, etoposide and cylophosphamide.  Extensive teaching of patient and spouse over the last 2 days.    Printed materials reviewed.  See education tab for details.

## 2016-02-16 NOTE — Procedures (Signed)
Interventional Radiology Procedure Note  Procedure: Placement of a right IJ approach single lumen PowerPort.  Tip is positioned at the superior cavoatrial junction and catheter is ready for immediate use.  Complications: No immediate Recommendations:  Accessed and ready to use. Routine line care   Eulas Post T. Kathlene Cote, M.D Pager:  [phone]

## 2016-02-16 NOTE — Progress Notes (Signed)
Rituxan dose and dilution verified with 2nd chemo competent RN Clotilde Dieter.

## 2016-02-16 NOTE — Telephone Encounter (Signed)
Per staff message I have scheduled appats

## 2016-02-16 NOTE — Care Management Important Message (Signed)
Important Message  Patient Details  Name: GARVIN COPUS MRN: [id number] Date of Birth: [birth year]   Medicare Important Message Given:  Yes    Camillo Flaming 02/16/2016, 10:32 AMImportant Message  Patient Details  Name: AERION VOLKER MRN: [id number] Date of Birth: 08/25/1945   Medicare Important Message Given:  Yes    Camillo Flaming 02/16/2016, 10:32 AM

## 2016-02-16 NOTE — Progress Notes (Signed)
Nutrition Follow-up  DOCUMENTATION CODES:   Severe malnutrition in context of acute illness/injury  INTERVENTION:   Diet advancement per MD Upon diet advancement continue Ensure Enlive po BID, each supplement provides 350 kcal and 20 grams of protein  RD to continue to monitor  NUTRITION DIAGNOSIS:   Inadequate oral intake related to acute illness, poor appetite as evidenced by per patient/family report.  Ongoing.  GOAL:   Patient will meet greater than or equal to 90% of their needs  Not meeting, NPO.  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 He was found to have a sepsis like picture with Fever, Elevated WBC, and Hypotension in the ED and rehydrated and placed on IV Vanc/Zosyn in Ed. He was admitted for Syncope likely 2/2 to Dehydration.   Patient currently NPO for pending port placement. Chemotherapy expected to start tomorrow, 11/7. When pt was on a HH diet, he was eating 100% of meals on 11/4 & 11/15.  Recommend resuming Ensure upon diet advancement to supplement his good PO intakes.  Estimated needs increased for upcoming cancer treatments.  Medications reviewed. Labs reviewed: Low K Mg/Phos WNL  Diet Order:  Diet NPO time specified Except for: Sips with Meds Diet NPO time specified Except for: Sips with Meds  Skin:  Reviewed, no issues  Last BM:  11/15  Height:   Ht Readings from Last 1 Encounters:  02/14/16 5\' 11"  (1.803 m)    Weight:   Wt Readings from Last 1 Encounters:  02/14/16 198 lb (89.8 kg)    Ideal Body Weight:  79.54 kg  BMI:  Body mass index is 27.62  kg/m.  Estimated Nutritional Needs:   Kcal:  2250-2450  Protein:  120-130g  Fluid:  2.-2.2L/day  EDUCATION NEEDS:   No education needs identified at this time  Clayton Bibles, MS, RD, LDN Pager: [phone] After Hours Pager: [phone]

## 2016-02-16 NOTE — Progress Notes (Addendum)
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 He was found to have a sepsis like picture with Fever, Elevated WBC, and Hypotension in the ED and rehydrated and placed on IV Vanc/Zosyn in Ed. He was admitted for Syncope likely 2/2 to Dehydration. He is being worked up for His Lymphoma and per Oncology will get a Port on Thursday, Bone Marrow Biopsy, and start Chemotherapy.   Assessment & Plan:   Active Problems:   Large B-cell lymphoma (HCC)   Syncope   Diffuse large B-cell lymphoma (HCC)   Hyponatremia   Protein-calorie malnutrition, severe  Syncope -No recurrent episodes -Probably 2/2 Volume depletion Because of poor by mouth intake -continue holding Lisinopril; since BP is rising will resume amlodipine (dose changed to 5 mg daily) -Continue with IVF Rehydration at 90 mL/hr -PT/OT has evaluated patient and recommended no follow up. -Repeat Orthostatics stable and WNL currently -will continue supportive care   HTN -BP rising now -will use amlodipine 5 mg daily -also started on PRN hydralazine -will follow VS and adjust antihypertensive regimen   Diffuse Large B-cell Lymphoma with type B symptoms -Dr. Alvy Bimler in Oncology Following and appreciate Recc's -Workup in Progress and Port Placement Thursday (02/16/2016) as Cancer is fairly aggressive. Dr. 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 Alvy Bimler) -Continue ensure Enlive 237 mL po BID  Diabetes Mellitus Type 2 -Hold off on Actos for now -continue SSI while inpatient  -patient encourage to eat and maintain good hydration   Prior CVA + Endarterectomy  -Continue Atorvastatin 40 mg and hold Plavix 75 daily in anticipation for a Port-a-cath placement -Asprin 81 mg Substituted for possible port placement on 11/16 -to resume plavix once ok by oncology and IR  SIRS with Unclear Source of Infection -Patient was Febrile and had elevated WBC -Has been afebrile now;  -will continue Acetaminophen 500 mg po q6hprn for Temp > 100.5 -Leukocytosis improving and went from 15.7 -> 8.3 -CT of Abdomen and Pelvis with Contrast demonstrated no source of infection -Possibly from Lymphoma -will continue supportive care -plan is for augmentin X 3 days to consolidate treatment.  Hyponatremia, improved -Na+ went from 132 -> 133 -> 134 -will continue with IVF Rehydration at 90 mL/hr -patient now eating and drinking better  DVT prophylaxis: Lovenox 40 mg Code Status: FULL Family Communication: Discussed with Patient's Daughter at bedside Disposition Plan: Likely home next week. Portable catheter on 11/16, bone marrow biopsy on 11/16 and initiation of chemotherapy today 11/16. Will follow rec's from oncology and look close for potential tumor lysis syndrome.  Consultants:   Hematology/Oncology Dr. Heath Lark  IR  Procedures:  -Portable cath on 11/16 -Bone marrow biopsy 11/16  Antimicrobials: IV Vancomycin and IV Zosyn  Subjective: Patient is afebrile now for 48 hours; denies CP and SOB. No nausea or vomiting. Eating better.  Objective: Vitals:   02/16/16 1240 02/16/16 1245 02/16/16 1307 02/16/16 1450  BP: (!) 157/76 (!) 159/70 (!) 148/74 139/71  Pulse: 87 88 91 96  Resp:   18 18  Temp:   98.1 F (36.7 C) 98.6 F (37 C)  TempSrc:   Oral Oral    SpO2: 99% 99% 95% 96%  Weight:      Height:        Intake/Output Summary (Last 24 hours) at 02/16/16 1608 Last data filed at 02/16/16 1553  Gross per 24 hour  Intake              240 ml  Output                0 ml  Net              240 ml   Filed Weights   02/12/16 1600 02/14/16 1638  Weight: 89.8 kg (198 lb) 89.8 kg (198 lb)    Examination: Constitutional:  NAD and appears calm and comfortable. Afebrile now for 48 hours. Tolerated BM biopsy (done by Dr. Alvy Bimler). Eyes: Lids and conjunctivae normal, no icterus Neck: Appears normal, supple, no cervical masses, normal ROM, no appreciable thyromegaly Respiratory: Clear to auscultation bilaterally, no wheezing, rales, rhonchi or crackles. Normal respiratory effort and patient is not tachypenic. No accessory muscle use.  Cardiovascular: RRR, no murmurs / rubs / gallops. S1 and S2 auscultated.  Abdomen: Soft, non-tender, non-distended. No masses palpated. No appreciable hepatosplenomegaly. Bowel sounds positive x4.  Musculoskeletal: No clubbing / cyanosis of digits/nails. No joint deformity upper and lower extremities.  Skin: No rashes, lesions, ulcers. No induration; Warm and dry.  Neurologic: CN 2-12 grossly intact with no focal deficits. Sensation intact in all 4 Extremities, Romberg sign cerebellar reflexes not assessed.  Psychiatric: Normal judgment and insight. Alert and oriented x 3. Normal mood and appropriate affect.   Data Reviewed: I have personally reviewed following labs and imaging studies  CBC:  Recent Labs Lab 02/12/16 1120 02/13/16 0447 02/14/16 0420 02/15/16 0428 02/16/16 0353  WBC 15.1* 15.7* 13.4* 11.5* 8.3  NEUTROABS 11.0*  --  9.4* 8.2* 5.1  HGB 13.3 11.9* 10.9* 11.2* 11.2*  HCT 39.9 35.9* 31.7* 33.2* 32.3*  MCV 88.3 87.1 87.3 86.0 85.2  PLT 234 219 160 227 633   Basic Metabolic Panel:  Recent Labs Lab 02/12/16 1120 02/13/16 0447 02/14/16 0420 02/15/16 0428 02/16/16 0353  NA 132* 133* 135 134*  136  K 4.4 3.7 3.6 3.6 3.2*  CL 97* 103 104 100* 100*  CO2 _0 GLUCOSE 147* 118* 121* 111* 91  BUN _1 CREATININE 0.88 0.79 0.90 1.06 0.93  CALCIUM 8.9 8.4* 8.5* 8.5* 8.3*  MG  --   --  1.9 1.8  --   PHOS  --   --  3.5 2.6  --    GFR: Estimated Creatinine Clearance: 77.6 mL/min (by C-G formula based on SCr of 0.93 mg/dL).   Liver Function Tests:  Recent Labs Lab 02/12/16 1120 02/13/16 0447 02/14/16 0420 02/15/16 0428  AST _2 ALT 16* 12* 12* 13*  ALKPHOS 52 46 49 59  BILITOT 0.7 1.0 0.9 0.8  PROT 7.6 7.0 6.2* 6.5  ALBUMIN 3.4* 3.0* 2.8* 2.6*   CBG:  Recent Labs Lab 02/12/16 2103  GLUCAP 123*   Sepsis Labs:  Recent Labs Lab 02/12/16 1133  LATICACIDVEN 1.85    Recent Results (from  the past 240 hour(s))  Blood Culture (routine x 2)     Status: None (Preliminary result)   Collection Time: 02/12/16 11:21 AM  Result Value Ref Range Status   Specimen Description BLOOD RIGHT AC  Final   Special Requests BOTTLES DRAWN AEROBIC AND ANAEROBIC 5CC  Final   Culture   Final    NO GROWTH 4 DAYS Performed at [facility]    Report Status PENDING  Incomplete  Blood Culture (routine x 2)     Status: None (Preliminary result)   Collection Time: 02/12/16 11:57 AM  Result Value Ref Range Status   Specimen Description BLOOD LEFT HAND  Final   Special Requests IN PEDIATRIC BOTTLE [place]  Final   Culture   Final    NO GROWTH 4 DAYS Performed at [facility]    Report Status PENDING  Incomplete  Urine culture     Status: None   Collection Time: 02/12/16  1:11 PM  Result Value Ref Range Status   Specimen Description URINE, CLEAN CATCH  Final   Special Requests NONE  Final   Culture NO GROWTH Performed at [facility]   Final   Report Status 02/13/2016 FINAL  Final    Radiology Studies: Ct Abdomen Pelvis W Contrast  Result Date: 02/14/2016 CLINICAL DATA:  Syncope, dehydration, sepsis. Diffuse large B-cell lymphoma.  EXAM: CT ABDOMEN AND PELVIS WITH CONTRAST TECHNIQUE: Multidetector CT imaging of the abdomen and pelvis was performed using the standard protocol following bolus administration of intravenous contrast. CONTRAST:  154m ISOVUE-300 IOPAMIDOL (ISOVUE-300) INJECTION 61% COMPARISON:  01/06/2016 FINDINGS: Lower chest: Small right pleural effusion. Hepatobiliary: Liver is within normal limits. Tiny layering gallstones (series 2/ image 20), without associated inflammatory changes. No intrahepatic extrahepatic ductal dilatation. Pancreas: Within normal limits. Spleen: Spleen is normal in size. Adrenals/Urinary Tract: Adrenal glands are within normal limits. 2.6 cm medial right upper pole renal cyst (series 2/ image 25). 9 mm anterior right lower pole renal cyst (series 2/ image 36). Mild diminished/delayed enhancement of the right kidney. Associated mild right hydronephrosis, new, likely secondary to extrinsic compression from right retroperitoneal lymphadenopathy. Mildly thick-walled bladder. Stomach/Bowel: Stomach is within normal limits. No evidence of bowel obstruction. Appendix is not discretely visualized. Sigmoid diverticulosis, without evidence of diverticulitis. Vascular/Lymphatic: No evidence of abdominal aortic aneurysm. Atherosclerotic calcifications of the abdominal aorta and branch vessels. Widespread abdominopelvic lymphadenopathy, including: --14 mm short axis right retrocrural node (series 2/ image 15), previously 17 mm --19 mm short axis left para-aortic node (series 2/ image 37), previously 27 mm, no longer necrotic --16 mm short axis aortocaval node (series 2/image 45), previously 18 mm, now necrotic --22 mm short axis left common iliac node (series 2/ image 55), previously 25 mm, now with increased necrosis --33 mm short axis right common iliac node (series 2/image 58), previously 24 mm, now necrotic --21 mm short axis right external iliac node (series 2/ image 69), previously 33 mm, no longer necrotic  --29 mm short axis left obturator node (series 2/ image 35), previously 23 mm Reproductive: Prostate is grossly unremarkable. Other: Small volume pelvic ascites (series 2/image 76), increased. Mild stranding along the right pericolic gutter (series 2/ image 51). Moderate fat containing left inguinal hernia, now with trace fluid inferiorly (series 2/ image 89). Musculoskeletal: Degenerative changes of the visualized thoracolumbar spine. IMPRESSION: Widespread retrocrural, retroperitoneal, and bilateral pelvic lymphadenopathy, waxing/waning when compared to recent CT, as above. Mild right hydronephrosis, likely secondary to extrinsic compression by right retroperitoneal lymphadenopathy,  with associated diminished enhancement of the right kidney. Spleen is normal in size. Additional ancillary findings as above. Electronically Signed   By: Julian Hy M.D.   On: 02/14/2016 17:35   Ir US Guide Vasc Access Right  Result Date: 02/16/2016 CLINICAL DATA:  Large B-cell lymphoma and need for porta cath to begin chemotherapy. EXAM: IMPLANTED PORT A CATH PLACEMENT WITH ULTRASOUND AND FLUOROSCOPIC GUIDANCE ANESTHESIA/SEDATION: 5.0 mg IV Versed; 100 mcg IV Fentanyl Total Moderate Sedation Time:  30 minutes The patient's level of consciousness and physiologic status were continuously monitored during the procedure by Radiology nursing. Additional Medications: 2 g IV Ancef. As antibiotic prophylaxis, Ancef was ordered pre-procedure and administered intravenously within one hour of incision. FLUOROSCOPY TIME:  12 seconds.  4.0 mGy. PROCEDURE: The procedure, risks, benefits, and alternatives were explained to the patient. Questions regarding the procedure were encouraged and answered. The patient understands and consents to the procedure. A time-out was performed prior to initiating the procedure. Ultrasound was utilized to confirm patency of the right internal jugular vein. The right neck and chest were prepped with  chlorhexidine in a sterile fashion, and a sterile drape was applied covering the operative field. Maximum barrier sterile technique with sterile gowns and gloves were used for the procedure. Local anesthesia was provided with 1% lidocaine. After creating a small venotomy incision, a 21 gauge needle was advanced into the right internal jugular vein under direct, real-time ultrasound guidance. Ultrasound image documentation was performed. After securing guidewire access, an 8 Fr dilator was placed. A J-wire was kinked to measure appropriate catheter length. A subcutaneous port pocket was then created along the upper chest wall utilizing sharp and blunt dissection. Portable cautery was utilized. The pocket was irrigated with sterile saline. A single lumen power injectable port was chosen for placement. The 8 Fr catheter was tunneled from the port pocket site to the venotomy incision. The port was placed in the pocket. External catheter was trimmed to appropriate length based on guidewire measurement. At the venotomy, an 8 Fr peel-away sheath was placed over a guidewire. The catheter was then placed through the sheath and the sheath removed. Final catheter positioning was confirmed and documented with a fluoroscopic spot image. The port was accessed with a needle and aspirated and flushed with heparinized saline. The access needle was removed. The venotomy and port pocket incisions were closed with subcutaneous 3-0 Monocryl and subcuticular 4-0 Vicryl. Dermabond was applied to both incisions. COMPLICATIONS: COMPLICATIONS None FINDINGS: After catheter placement, the tip lies at the cavo-atrial junction. The catheter aspirates normally and is ready for immediate use. IMPRESSION: Placement of single lumen port a cath via right internal jugular vein. The catheter tip lies at the cavo-atrial junction. A power injectable port a cath was placed and is ready for immediate use. Electronically Signed   By: Aletta Edouard M.D.    On: 02/16/2016 15:28   Ir Fluoro Guide Port Insertion Right  Result Date: 02/16/2016 CLINICAL DATA:  Large B-cell lymphoma and need for porta cath to begin chemotherapy. EXAM: IMPLANTED PORT A CATH PLACEMENT WITH ULTRASOUND AND FLUOROSCOPIC GUIDANCE ANESTHESIA/SEDATION: 5.0 mg IV Versed; 100 mcg IV Fentanyl Total Moderate Sedation Time:  30 minutes The patient's level of consciousness and physiologic status were continuously monitored during the procedure by Radiology nursing. Additional Medications: 2 g IV Ancef. As antibiotic prophylaxis, Ancef was ordered pre-procedure and administered intravenously within one hour of incision. FLUOROSCOPY TIME:  12 seconds.  4.0 mGy. PROCEDURE: The procedure, risks, benefits, and  alternatives were explained to the patient. Questions regarding the procedure were encouraged and answered. The patient understands and consents to the procedure. A time-out was performed prior to initiating the procedure. Ultrasound was utilized to confirm patency of the right internal jugular vein. The right neck and chest were prepped with chlorhexidine in a sterile fashion, and a sterile drape was applied covering the operative field. Maximum barrier sterile technique with sterile gowns and gloves were used for the procedure. Local anesthesia was provided with 1% lidocaine. After creating a small venotomy incision, a 21 gauge needle was advanced into the right internal jugular vein under direct, real-time ultrasound guidance. Ultrasound image documentation was performed. After securing guidewire access, an 8 Fr dilator was placed. A J-wire was kinked to measure appropriate catheter length. A subcutaneous port pocket was then created along the upper chest wall utilizing sharp and blunt dissection. Portable cautery was utilized. The pocket was irrigated with sterile saline. A single lumen power injectable port was chosen for placement. The 8 Fr catheter was tunneled from the port pocket site to  the venotomy incision. The port was placed in the pocket. External catheter was trimmed to appropriate length based on guidewire measurement. At the venotomy, an 8 Fr peel-away sheath was placed over a guidewire. The catheter was then placed through the sheath and the sheath removed. Final catheter positioning was confirmed and documented with a fluoroscopic spot image. The port was accessed with a needle and aspirated and flushed with heparinized saline. The access needle was removed. The venotomy and port pocket incisions were closed with subcutaneous 3-0 Monocryl and subcuticular 4-0 Vicryl. Dermabond was applied to both incisions. COMPLICATIONS: COMPLICATIONS None FINDINGS: After catheter placement, the tip lies at the cavo-atrial junction. The catheter aspirates normally and is ready for immediate use. IMPRESSION: Placement of single lumen port a cath via right internal jugular vein. The catheter tip lies at the cavo-atrial junction. A power injectable port a cath was placed and is ready for immediate use. Electronically Signed   By: Aletta Edouard M.D.   On: 02/16/2016 15:28   Scheduled Meds: . sodium chloride   Intravenous Once  . amLODipine  5 mg Oral Daily  . amoxicillin-clavulanate  1 tablet Oral Q12H  . aspirin EC  81 mg Oral Daily  . atorvastatin  40 mg Oral q1800  . enoxaparin (LOVENOX) injection  40 mg Subcutaneous Q24H  . feeding supplement (ENSURE ENLIVE)  237 mL Oral BID BM  . lidocaine      . senna-docusate  1 tablet Oral BID  . sodium chloride flush  3 mL Intravenous Q12H   Continuous Infusions:   LOS: 4 days   Barton Dubois, MD [facility] Pager [phone]  If 7PM-7AM, please contact night-coverage [email or web address] Password [email or web address] 02/16/2016, 4:08 PM

## 2016-02-16 NOTE — Procedures (Signed)
Bone Marrow Biopsy and Aspiration Procedure Note   Informed consent was obtained and potential risks including bleeding, infection and pain were reviewed with the patient. The patient's name, date of birth, identification, consent and allergies were verified prior to the start of procedure and time out was performed.  The left posterior iliac crest was chosen as the site of biopsy.  The skin was prepped with Betadine solution.   8 cc of 1% lidocaine was used to provide local anaesthesia.   10 cc of bone marrow aspirate was obtained followed by 1 inch biopsy.   The procedure was tolerated well and there were no complications.  The patient was stable at the end of the procedure.  Specimens sent for flow cytometry, cytogenetics and additional studies.   

## 2016-02-16 NOTE — Progress Notes (Signed)
Samuel Berger   DOB:03/27/1946   PP#:[id number]    Subjective: He is feeling better. Afebrile for 48 hours. Feeling stronger  Objective:  Vitals:   02/15/16 2329 02/16/16 0506  BP: (!) 156/73 (!) 174/82  Pulse: 83 86  Resp: 20 20  Temp: 98.2 F (36.8 C) 98.7 F (37.1 C)     Intake/Output Summary (Last 24 hours) at 02/16/16 0804 Last data filed at 02/15/16 1518  Gross per 24 hour  Intake            861.5 ml  Output                0 ml  Net            861.5 ml    GENERAL:alert, no distress and comfortable SKIN: skin color, texture, turgor are normal, no rashes or significant lesions EYES: normal, Conjunctiva are pink and non-injected, sclera clear OROPHARYNX:no exudate, no erythema and lips, buccal mucosa, and tongue normal  NECK: supple, thyroid normal size, non-tender, without nodularity LYMPH:  no palpable lymphadenopathy in the cervical, axillary or inguinal LUNGS: clear to auscultation and percussion with normal breathing effort HEART: regular rate & rhythm and no murmurs and no lower extremity edema ABDOMEN:abdomen soft, non-tender and normal bowel sounds Musculoskeletal:no cyanosis of digits and no clubbing  NEURO: alert & oriented x 3 with fluent speech, no focal motor/sensory deficits   Labs:  Lab Results  Component Value Date   WBC 8.3 02/16/2016   HGB 11.2 (L) 02/16/2016   HCT 32.3 (L) 02/16/2016   MCV 85.2 02/16/2016   PLT 225 02/16/2016   NEUTROABS 5.1 02/16/2016    Lab Results  Component Value Date   NA 136 02/16/2016   K 3.2 (L) 02/16/2016   CL 100 (L) 02/16/2016   CO2 26 02/16/2016    Studies:  Ct Abdomen Pelvis W Contrast  Result Date: 02/14/2016 CLINICAL DATA:  Syncope, dehydration, sepsis. Diffuse large B-cell lymphoma. EXAM: CT ABDOMEN AND PELVIS WITH CONTRAST TECHNIQUE: Multidetector CT imaging of the abdomen and pelvis was performed using the standard protocol following bolus administration of intravenous contrast. CONTRAST:  132m  ISOVUE-300 IOPAMIDOL (ISOVUE-300) INJECTION 61% COMPARISON:  01/06/2016 FINDINGS: Lower chest: Small right pleural effusion. Hepatobiliary: Liver is within normal limits. Tiny layering gallstones (series 2/ image 20), without associated inflammatory changes. No intrahepatic extrahepatic ductal dilatation. Pancreas: Within normal limits. Spleen: Spleen is normal in size. Adrenals/Urinary Tract: Adrenal glands are within normal limits. 2.6 cm medial right upper pole renal cyst (series 2/ image 25). 9 mm anterior right lower pole renal cyst (series 2/ image 36). Mild diminished/delayed enhancement of the right kidney. Associated mild right hydronephrosis, new, likely secondary to extrinsic compression from right retroperitoneal lymphadenopathy. Mildly thick-walled bladder. Stomach/Bowel: Stomach is within normal limits. No evidence of bowel obstruction. Appendix is not discretely visualized. Sigmoid diverticulosis, without evidence of diverticulitis. Vascular/Lymphatic: No evidence of abdominal aortic aneurysm. Atherosclerotic calcifications of the abdominal aorta and branch vessels. Widespread abdominopelvic lymphadenopathy, including: --14 mm short axis right retrocrural node (series 2/ image 15), previously 17 mm --19 mm short axis left para-aortic node (series 2/ image 37), previously 27 mm, no longer necrotic --16 mm short axis aortocaval node (series 2/image 45), previously 18 mm, now necrotic --22 mm short axis left common iliac node (series 2/ image 55), previously 25 mm, now with increased necrosis --33 mm short axis right common iliac node (series 2/image 58), previously 24 mm, now necrotic --21 mm short axis right external  iliac node (series 2/ image 74), previously 33 mm, no longer necrotic --29 mm short axis left obturator node (series 2/ image 35), previously 23 mm Reproductive: Prostate is grossly unremarkable. Other: Small volume pelvic ascites (series 2/image 76), increased. Mild stranding along the  right pericolic gutter (series 2/ image 51). Moderate fat containing left inguinal hernia, now with trace fluid inferiorly (series 2/ image 89). Musculoskeletal: Degenerative changes of the visualized thoracolumbar spine. IMPRESSION: Widespread retrocrural, retroperitoneal, and bilateral pelvic lymphadenopathy, waxing/waning when compared to recent CT, as above. Mild right hydronephrosis, likely secondary to extrinsic compression by right retroperitoneal lymphadenopathy, with associated diminished enhancement of the right kidney. Spleen is normal in size. Additional ancillary findings as above. Electronically Signed   By: Julian Hy M.D.   On: 02/14/2016 17:35    Assessment & Plan:   Diffuse large B cell lymphoma, at least stage III Significant B symptoms with anorexia, weight loss and night sweats Awaiting port placement today I will proceed with unsedated bone marrow biopsy today We discussed treatment goals, etc yesterday. Please refer to my noteson 02/15/16 for consent I will start [facility] today To reduce his risk of allergic reaction to rituximab, I plan to start his rituximab treatment on Friday and will cap the maximum infusion rate at 50 mls per hour We will monitor his blood work for tumor lysis syndrome daily  Fever, leukocytosis, resolved Source is unknown, but with the hydronephrosis seen on the right kidney on CT scan, I suspect this is likely due to possible pyelonephritis I will discontinue vancomycin. I will discontinue Zosyn and switch him to oral Augmentin until Sunday to complete 1 week course of treatment  Anorexia with weight loss, protein calorie malnutrition Hyponatremia & hypokalemia due to poor oral intake I will consult dietitian for review. Recommended frequent small meals  Chronic constipation, resolved We discussed management with laxative, increase fiber intake and hydration  History of stroke without residual deficit I recommend stopping Plavix and  substitute with 81 mg aspirin. With his recent near-syncopal episode, I suspect is due to hypotension due to poor oral intake and dehydration. I will stop lisinopril as well  Essential hypertension Hold lisinopril. I started him on amlodine and hydralazine prn  Weakness with near syncopal episode  Consult PT for safety evaluation   Discharge planning  Unknown. Likely next week after chemotherapy is completed  Heath Lark, MD 02/16/2016  8:04 AM

## 2016-02-16 NOTE — Progress Notes (Signed)
I am informed by nursing staff that the port placement will not happen until after 3 PM. I will release 1 dose of IV dexamethasone now and plan on giving him rituximab today and to delay his systemic chemotherapy to start tomorrow. I would like his rituximab to be capped at maximum infusion rate at 50 mg/h for the duration of his treatment

## 2016-02-16 NOTE — Progress Notes (Signed)
   02/16/16 1100  Clinical Encounter Type  Visited With Patient  Visit Type Initial;Psychological support;Spiritual support  Referral From Nurse  Consult/Referral To Chaplain  Spiritual Encounters  Spiritual Needs Emotional;Other (Comment) (Pastoral Conversation/Support)  Stress Factors  Patient Stress Factors Major life changes;Health changes   I was referred to the patient by the nurse who stated that the patient was going to begin chemo today and that he may be able to use Spiritual Care support.  The patient was very receptive to my visit. He stated that he was not happy about having his chemo time changed for the day, but that he was doing ok with his health condition.  The patient stated that he has been given great care from his doctor and nurses. He feels that he has good support from his family.  We talked about what gives him hope and he stated that his family gives him hope. He told me about his wife and two daughters. This kept his mind off of being in the hospital and brought him comfort.  The patient talked about his goal being to be able to play golf with his friends again soon.  Will follow-up.  [place] M.Div.

## 2016-02-17 LAB — COMPREHENSIVE METABOLIC PANEL
ALK PHOS: 61 U/L (ref 38–126)
ALT: 20 U/L (ref 17–63)
ANION GAP: 9 (ref 5–15)
AST: 24 U/L (ref 15–41)
Albumin: 2.9 g/dL — ABNORMAL LOW (ref 3.5–5.0)
BILIRUBIN TOTAL: 0.7 mg/dL (ref 0.3–1.2)
BUN: 13 mg/dL (ref 6–20)
CALCIUM: 8.9 mg/dL (ref 8.9–10.3)
CO2: 27 mmol/L (ref 22–32)
CREATININE: 0.7 mg/dL (ref 0.61–1.24)
Chloride: 102 mmol/L (ref 101–111)
GFR calc non Af Amer: >60 mL/min (ref >60)
Glucose, Bld: 162 mg/dL — ABNORMAL HIGH (ref 65–99)
Potassium: 3.6 mmol/L (ref 3.5–5.1)
Sodium: 138 mmol/L (ref 135–145)
TOTAL PROTEIN: 6.6 g/dL (ref 6.5–8.1)

## 2016-02-17 LAB — CULTURE, BLOOD (ROUTINE X 2)
CULTURE: NO GROWTH
CULTURE: NO GROWTH

## 2016-02-17 LAB — CBC WITH DIFFERENTIAL/PLATELET
Basophils Absolute: 0 10*3/uL (ref 0.0–0.1)
Basophils Relative: 0 %
Eosinophils Absolute: 0 10*3/uL (ref 0.0–0.7)
Eosinophils Relative: 0 %
HEMATOCRIT: 33.8 % — AB (ref 39.0–52.0)
HEMOGLOBIN: 11.4 g/dL — AB (ref 13.0–17.0)
LYMPHS ABS: 0.7 10*3/uL (ref 0.7–4.0)
LYMPHS PCT: 11 %
MCH: 29.2 pg (ref 26.0–34.0)
MCHC: 33.7 g/dL (ref 30.0–36.0)
MCV: 86.7 fL (ref 78.0–100.0)
MONOS PCT: 13 %
Monocytes Absolute: 0.8 10*3/uL (ref 0.1–1.0)
NEUTROS ABS: 4.7 10*3/uL (ref 1.7–7.7)
NEUTROS PCT: 76 %
Platelets: 236 10*3/uL (ref 150–400)
RBC: 3.9 MIL/uL — AB (ref 4.22–5.81)
RDW: 14.3 % (ref 11.5–15.5)
WBC: 6.2 10*3/uL (ref 4.0–10.5)

## 2016-02-17 LAB — URIC ACID: URIC ACID, SERUM: 2.8 mg/dL — AB (ref 4.4–7.6)

## 2016-02-17 LAB — LACTATE DEHYDROGENASE: LDH: 244 U/L — ABNORMAL HIGH (ref 98–192)

## 2016-02-17 MED ORDER — LISINOPRIL 10 MG PO TABS
5.0000 mg | ORAL_TABLET | Freq: Every day | ORAL | Status: DC
  Administered 2016-02-17 – 2016-02-21 (×5): 5 mg via ORAL
  Filled 2016-02-17 (×5): qty 1

## 2016-02-17 MED ORDER — SODIUM CHLORIDE 0.9 % IV SOLN
INTRAVENOUS | Status: DC
  Administered 2016-02-17: 09:00:00 via INTRAVENOUS

## 2016-02-17 MED ORDER — VINCRISTINE SULFATE CHEMO INJECTION 1 MG/ML
Freq: Once | INTRAVENOUS | Status: AC
  Administered 2016-02-17: 10:00:00 via INTRAVENOUS
  Filled 2016-02-17 (×2): qty 11

## 2016-02-17 MED ORDER — SODIUM CHLORIDE 0.9% FLUSH
3.0000 mL | INTRAVENOUS | Status: DC | PRN
Start: 2016-02-17 — End: 2016-02-21

## 2016-02-17 MED ORDER — SODIUM CHLORIDE 0.9% FLUSH: 10.0000 mL | INTRAVENOUS | Status: DC | PRN

## 2016-02-17 MED ORDER — SODIUM CHLORIDE 0.9 % IV SOLN
Freq: Once | INTRAVENOUS | Status: AC
  Administered 2016-02-17: 18 mg via INTRAVENOUS
  Filled 2016-02-17: qty 4

## 2016-02-17 MED ORDER — ALTEPLASE 2 MG IJ SOLR
2.0000 mg | Freq: Once | INTRAMUSCULAR | Status: DC | PRN
Start: 2016-02-17 — End: 2016-02-21

## 2016-02-17 MED ORDER — HEPARIN SOD (PORK) LOCK FLUSH 100 UNIT/ML IV SOLN: 250.0000 [IU] | Freq: Once | INTRAVENOUS | Status: DC | PRN

## 2016-02-17 MED ORDER — ALLOPURINOL 300 MG PO TABS
300.0000 mg | ORAL_TABLET | Freq: Every day | ORAL | Status: DC
  Administered 2016-02-17 – 2016-02-21 (×5): 300 mg via ORAL
  Filled 2016-02-17 (×5): qty 1

## 2016-02-17 MED ORDER — HEPARIN SOD (PORK) LOCK FLUSH 100 UNIT/ML IV SOLN: 500.0000 [IU] | Freq: Once | INTRAVENOUS | Status: DC | PRN

## 2016-02-17 MED ORDER — HOT PACK MISC ONCOLOGY: 1.0000 | Freq: Once | Status: DC | PRN

## 2016-02-17 MED ORDER — COLD PACK MISC ONCOLOGY: 1.0000 | Freq: Once | Status: DC | PRN

## 2016-02-17 NOTE — Progress Notes (Signed)
Patient care is transferred to oncology service as notified by Dr. Alvy Bimler, will stop following. Phillips Climes MD

## 2016-02-17 NOTE — Progress Notes (Cosign Needed)
Calculation and dosage for Doxorubicin,Etoposide and Vincristine verified by 2nd  Chemo certified RN Drue Dun

## 2016-02-17 NOTE — Progress Notes (Signed)
Samuel Berger   DOB:1945-08-24   OT#:[id number]    Subjective: He feels well. Denies infusion reaction.  Objective:  Vitals:   02/17/16 0507 02/17/16 0958  BP: (!) 153/87 133/65  Pulse: 73 75  Resp: 18 18  Temp: 97.7 F (36.5 C) 97.8 F (36.6 C)     Intake/Output Summary (Last 24 hours) at 02/17/16 1029 Last data filed at 02/17/16 0818  Gross per 24 hour  Intake             1291 ml  Output                0 ml  Net             1291 ml    GENERAL:alert, no distress and comfortable SKIN: skin color, texture, turgor are normal, no rashes or significant lesions EYES: normal, Conjunctiva are pink and non-injected, sclera clear Musculoskeletal:no cyanosis of digits and no clubbing  NEURO: alert & oriented x 3 with fluent speech, no focal motor/sensory deficits   Labs:  Lab Results  Component Value Date   WBC 6.2 02/17/2016   HGB 11.4 (L) 02/17/2016   HCT 33.8 (L) 02/17/2016   MCV 86.7 02/17/2016   PLT 236 02/17/2016   NEUTROABS 4.7 02/17/2016    Lab Results  Component Value Date   NA 138 02/17/2016   K 3.6 02/17/2016   CL 102 02/17/2016   CO2 27 02/17/2016    Studies:  Ir US Guide Vasc Access Right  Result Date: 02/16/2016 CLINICAL DATA:  Large B-cell lymphoma and need for porta cath to begin chemotherapy. EXAM: IMPLANTED PORT A CATH PLACEMENT WITH ULTRASOUND AND FLUOROSCOPIC GUIDANCE ANESTHESIA/SEDATION: 5.0 mg IV Versed; 100 mcg IV Fentanyl Total Moderate Sedation Time:  30 minutes The patient's level of consciousness and physiologic status were continuously monitored during the procedure by Radiology nursing. Additional Medications: 2 g IV Ancef. As antibiotic prophylaxis, Ancef was ordered pre-procedure and administered intravenously within one hour of incision. FLUOROSCOPY TIME:  12 seconds.  4.0 mGy. PROCEDURE: The procedure, risks, benefits, and alternatives were explained to the patient. Questions regarding the procedure were encouraged and answered. The patient  understands and consents to the procedure. A time-out was performed prior to initiating the procedure. Ultrasound was utilized to confirm patency of the right internal jugular vein. The right neck and chest were prepped with chlorhexidine in a sterile fashion, and a sterile drape was applied covering the operative field. Maximum barrier sterile technique with sterile gowns and gloves were used for the procedure. Local anesthesia was provided with 1% lidocaine. After creating a small venotomy incision, a 21 gauge needle was advanced into the right internal jugular vein under direct, real-time ultrasound guidance. Ultrasound image documentation was performed. After securing guidewire access, an 8 Fr dilator was placed. A J-wire was kinked to measure appropriate catheter length. A subcutaneous port pocket was then created along the upper chest wall utilizing sharp and blunt dissection. Portable cautery was utilized. The pocket was irrigated with sterile saline. A single lumen power injectable port was chosen for placement. The 8 Fr catheter was tunneled from the port pocket site to the venotomy incision. The port was placed in the pocket. External catheter was trimmed to appropriate length based on guidewire measurement. At the venotomy, an 8 Fr peel-away sheath was placed over a guidewire. The catheter was then placed through the sheath and the sheath removed. Final catheter positioning was confirmed and documented with a fluoroscopic spot image. The port  was accessed with a needle and aspirated and flushed with heparinized saline. The access needle was removed. The venotomy and port pocket incisions were closed with subcutaneous 3-0 Monocryl and subcuticular 4-0 Vicryl. Dermabond was applied to both incisions. COMPLICATIONS: COMPLICATIONS None FINDINGS: After catheter placement, the tip lies at the cavo-atrial junction. The catheter aspirates normally and is ready for immediate use. IMPRESSION: Placement of single  lumen port a cath via right internal jugular vein. The catheter tip lies at the cavo-atrial junction. A power injectable port a cath was placed and is ready for immediate use. Electronically Signed   By: Aletta Edouard M.D.   On: 02/16/2016 15:28   Ir Fluoro Guide Port Insertion Right  Result Date: 02/16/2016 CLINICAL DATA:  Large B-cell lymphoma and need for porta cath to begin chemotherapy. EXAM: IMPLANTED PORT A CATH PLACEMENT WITH ULTRASOUND AND FLUOROSCOPIC GUIDANCE ANESTHESIA/SEDATION: 5.0 mg IV Versed; 100 mcg IV Fentanyl Total Moderate Sedation Time:  30 minutes The patient's level of consciousness and physiologic status were continuously monitored during the procedure by Radiology nursing. Additional Medications: 2 g IV Ancef. As antibiotic prophylaxis, Ancef was ordered pre-procedure and administered intravenously within one hour of incision. FLUOROSCOPY TIME:  12 seconds.  4.0 mGy. PROCEDURE: The procedure, risks, benefits, and alternatives were explained to the patient. Questions regarding the procedure were encouraged and answered. The patient understands and consents to the procedure. A time-out was performed prior to initiating the procedure. Ultrasound was utilized to confirm patency of the right internal jugular vein. The right neck and chest were prepped with chlorhexidine in a sterile fashion, and a sterile drape was applied covering the operative field. Maximum barrier sterile technique with sterile gowns and gloves were used for the procedure. Local anesthesia was provided with 1% lidocaine. After creating a small venotomy incision, a 21 gauge needle was advanced into the right internal jugular vein under direct, real-time ultrasound guidance. Ultrasound image documentation was performed. After securing guidewire access, an 8 Fr dilator was placed. A J-wire was kinked to measure appropriate catheter length. A subcutaneous port pocket was then created along the upper chest wall utilizing  sharp and blunt dissection. Portable cautery was utilized. The pocket was irrigated with sterile saline. A single lumen power injectable port was chosen for placement. The 8 Fr catheter was tunneled from the port pocket site to the venotomy incision. The port was placed in the pocket. External catheter was trimmed to appropriate length based on guidewire measurement. At the venotomy, an 8 Fr peel-away sheath was placed over a guidewire. The catheter was then placed through the sheath and the sheath removed. Final catheter positioning was confirmed and documented with a fluoroscopic spot image. The port was accessed with a needle and aspirated and flushed with heparinized saline. The access needle was removed. The venotomy and port pocket incisions were closed with subcutaneous 3-0 Monocryl and subcuticular 4-0 Vicryl. Dermabond was applied to both incisions. COMPLICATIONS: COMPLICATIONS None FINDINGS: After catheter placement, the tip lies at the cavo-atrial junction. The catheter aspirates normally and is ready for immediate use. IMPRESSION: Placement of single lumen port a cath via right internal jugular vein. The catheter tip lies at the cavo-atrial junction. A power injectable port a cath was placed and is ready for immediate use. Electronically Signed   By: Aletta Edouard M.D.   On: 02/16/2016 15:28    Assessment & Plan:   Diffuse large B cell lymphoma, at least stage III Significant B symptoms with anorexia, weight loss and  night sweats Awaiting port placement today I will proceed with unsedated bone marrow biopsy today We discussed treatment goals, etc yesterday. Please refer to my noteson 02/15/16 for consent I will start [facility] today. He has completed rituximab We will monitor his blood work for tumor lysis syndrome daily  Fever, leukocytosis, resolved Source is unknown, but with the hydronephrosis seen on the right kidney on CT scan, I suspect this is likely due to possible  pyelonephritis I will discontinue vancomycin. I will discontinue Zosyn and switch him to oral Augmentin until Sunday to complete 1 week course of treatment  Anorexia with weight loss, protein calorie malnutrition, resolving I will consult dietitian for review. Recommended frequent small meals  Chronic constipation, resolved We discussed management with laxative, increase fiber intake and hydration  History of stroke without residual deficit I recommend stopping Plavix and substitute with 81 mg aspirin. With his recent near-syncopal episode, I suspect is due to hypotension due to poor oral intake and dehydration.  Essential hypertension I started him on amlodine and hydralazine prn Resume lisinopril  Weakness with near syncopal episode  Consult PT for safety evaluation   Discharge planning  Likely next week after chemotherapy is completed  Heath Lark, MD 02/17/2016  10:29 AM

## 2016-02-18 DIAGNOSIS — N10 Acute tubulo-interstitial nephritis: Secondary | ICD-10-CM

## 2016-02-18 LAB — COMPREHENSIVE METABOLIC PANEL
ALBUMIN: 2.8 g/dL — AB (ref 3.5–5.0)
ALK PHOS: 66 U/L (ref 38–126)
ALT: 39 U/L (ref 17–63)
AST: 43 U/L — ABNORMAL HIGH (ref 15–41)
Anion gap: 8 (ref 5–15)
BUN: 17 mg/dL (ref 6–20)
CALCIUM: 9.1 mg/dL (ref 8.9–10.3)
CHLORIDE: 101 mmol/L (ref 101–111)
CO2: 30 mmol/L (ref 22–32)
CREATININE: 0.84 mg/dL (ref 0.61–1.24)
GFR calc Af Amer: >60 mL/min (ref >60)
GFR calc non Af Amer: >60 mL/min (ref >60)
GLUCOSE: 140 mg/dL — AB (ref 65–99)
Potassium: 4 mmol/L (ref 3.5–5.1)
SODIUM: 139 mmol/L (ref 135–145)
Total Bilirubin: 0.4 mg/dL (ref 0.3–1.2)
Total Protein: 6.9 g/dL (ref 6.5–8.1)

## 2016-02-18 LAB — LACTATE DEHYDROGENASE: LDH: 258 U/L — ABNORMAL HIGH (ref 98–192)

## 2016-02-18 LAB — URIC ACID: Uric Acid, Serum: 2.6 mg/dL — ABNORMAL LOW (ref 4.4–7.6)

## 2016-02-18 MED ORDER — VINCRISTINE SULFATE CHEMO INJECTION 1 MG/ML
Freq: Once | INTRAVENOUS | Status: AC
  Administered 2016-02-18: 12:00:00 via INTRAVENOUS
  Filled 2016-02-18 (×2): qty 11

## 2016-02-18 MED ORDER — SODIUM CHLORIDE 0.9 % IV SOLN
Freq: Once | INTRAVENOUS | Status: AC
  Administered 2016-02-18: 8 mg via INTRAVENOUS
  Filled 2016-02-18: qty 4

## 2016-02-18 NOTE — Progress Notes (Signed)
HEMATOLOGY-ONCOLOGY PROGRESS NOTE  SUBJECTIVE: Diffuse large B-cell lymphoma on day 2 of chemotherapy with R-EPOCH He tolerated chemotherapy yesterday extremely well. He did not have any nausea vomiting. He did not have any fevers or chills. His bowels are moving well.  OBJECTIVE: REVIEW OF SYSTEMS:   Constitutional: Denies fevers, chills or abnormal weight loss Eyes: Denies blurriness of vision Ears, nose, mouth, throat, and face: Denies mucositis or sore throat Respiratory: Denies cough, dyspnea or wheezes Cardiovascular: Denies palpitation, chest discomfort Gastrointestinal:  Denies nausea, heartburn or change in bowel habits Skin: Denies abnormal skin rashes Lymphatics: Denies new lymphadenopathy or easy bruising Neurological:Denies numbness, tingling or new weaknesses Behavioral/Psych: Mood is stable, no new changes  Extremities: No lower extremity edema All other systems were reviewed with the patient and are negative.  PHYSICAL EXAMINATION: ECOG PERFORMANCE STATUS: 1 - Symptomatic but completely ambulatory  Vitals:   02/17/16 2147 02/18/16 0447  BP: (!) 141/66 (!) 147/82  Pulse: 73 70  Resp: 16 18  Temp: 97.9 F (36.6 C) 97.5 F (36.4 C)   Filed Weights   02/12/16 1600 02/14/16 1638  Weight: 198 lb (89.8 kg) 198 lb (89.8 kg)    GENERAL:alert, no distress and comfortable SKIN: skin color, texture, turgor are normal, no rashes or significant lesions EYES: normal, Conjunctiva are pink and non-injected, sclera clear OROPHARYNX:no exudate, no erythema and lips, buccal mucosa, and tongue normal  NECK: supple, thyroid normal size, non-tender, without nodularity LYMPH:  no palpable lymphadenopathy in the cervical, axillary or inguinal LUNGS: Slightly diminished lung sounds at the bases HEART: regular rate & rhythm and no murmurs and no lower extremity edema ABDOMEN:abdomen soft, non-tender and normal bowel sounds Musculoskeletal:no cyanosis of digits and no clubbing   NEURO: alert & oriented x 3 with fluent speech, no focal motor/sensory deficits  LABORATORY DATA:  I have reviewed the data as listed CMP Latest Ref Rng & Units 02/18/2016 02/17/2016 02/16/2016  Glucose 65 - 99 mg/dL 140(H) 162(H) 91  BUN 6 - 20 mg/dL _0 Creatinine 0.61 - 1.24 mg/dL 0.84 0.70 0.93  Sodium 135 - 145 mmol/L 139 138 136  Potassium 3.5 - 5.1 mmol/L 4.0 3.6 3.2(L)  Chloride 101 - 111 mmol/L 101 102 100(L)  CO2 22 - 32 mmol/L _1 Calcium 8.9 - 10.3 mg/dL 9.1 8.9 8.3(L)  Total Protein 6.5 - 8.1 g/dL 6.9 6.6 -  Total Bilirubin 0.3 - 1.2 mg/dL 0.4 0.7 -  Alkaline Phos 38 - 126 U/L 66 61 -  AST 15 - 41 U/L 43(H) 24 -  ALT 17 - 63 U/L 39 20 -    Lab Results  Component Value Date   WBC 6.2 02/17/2016   HGB 11.4 (L) 02/17/2016   HCT 33.8 (L) 02/17/2016   MCV 86.7 02/17/2016   PLT 236 02/17/2016   NEUTROABS 4.7 02/17/2016    ASSESSMENT AND PLAN: 1.  Diffuse large B-cell lymphoma: Bone marrow biopsy was performed and is pending. At least he has stage IIIB Today he receives chemotherapy with EPOCH. He tolerated yesterday's treatment extremely well. He still has about another hour left on his doxorubicin. Laboratory reveals a slight elevation of AST. We will monitor and watch this.  2. suspicion for pyelonephritis: He is afebrile and is currently on Augmentin. He does not have any flank pain or urinary discomfort. 3. Constipation: Resolved  Monitoring very closely for chemotherapy toxicities. Will follow daily

## 2016-02-19 LAB — COMPREHENSIVE METABOLIC PANEL
ALK PHOS: 65 U/L (ref 38–126)
ALT: 47 U/L (ref 17–63)
ANION GAP: 9 (ref 5–15)
AST: 37 U/L (ref 15–41)
Albumin: 2.8 g/dL — ABNORMAL LOW (ref 3.5–5.0)
BILIRUBIN TOTAL: 0.5 mg/dL (ref 0.3–1.2)
BUN: 17 mg/dL (ref 6–20)
CALCIUM: 8.9 mg/dL (ref 8.9–10.3)
CO2: 28 mmol/L (ref 22–32)
Chloride: 100 mmol/L — ABNORMAL LOW (ref 101–111)
Creatinine, Ser: 0.76 mg/dL (ref 0.61–1.24)
GFR calc non Af Amer: >60 mL/min (ref >60)
GLUCOSE: 146 mg/dL — AB (ref 65–99)
Potassium: 4 mmol/L (ref 3.5–5.1)
Sodium: 137 mmol/L (ref 135–145)
TOTAL PROTEIN: 6.6 g/dL (ref 6.5–8.1)

## 2016-02-19 LAB — LACTATE DEHYDROGENASE: LDH: 240 U/L — ABNORMAL HIGH (ref 98–192)

## 2016-02-19 LAB — URIC ACID: URIC ACID, SERUM: 2.7 mg/dL — AB (ref 4.4–7.6)

## 2016-02-19 MED ORDER — VINCRISTINE SULFATE CHEMO INJECTION 1 MG/ML
Freq: Once | INTRAVENOUS | Status: AC
  Administered 2016-02-19: 12:00:00 via INTRAVENOUS
  Filled 2016-02-19: qty 11

## 2016-02-19 MED ORDER — SENNA 8.6 MG PO TABS
1.0000 | ORAL_TABLET | Freq: Every evening | ORAL | Status: DC | PRN
  Administered 2016-02-19 – 2016-02-20 (×2): 8.6 mg via ORAL
  Filled 2016-02-19 (×2): qty 1

## 2016-02-19 MED ORDER — SODIUM CHLORIDE 0.9 % IV SOLN
Freq: Once | INTRAVENOUS | Status: AC
  Administered 2016-02-19: 8 mg via INTRAVENOUS
  Filled 2016-02-19: qty 4

## 2016-02-19 NOTE — Progress Notes (Signed)
HEMATOLOGY-ONCOLOGY PROGRESS NOTE  SUBJECTIVE: Tolerated Ydays chemo very well. He denies nausea or vomiting. Eating well. Had bowel movements. Denies mouth sores.  OBJECTIVE: REVIEW OF SYSTEMS:   Constitutional: Denies fevers, chills or abnormal weight loss Eyes: Denies blurriness of vision Ears, nose, mouth, throat, and face: Denies mucositis or sore throat Respiratory: Denies cough, dyspnea or wheezes Cardiovascular: Denies palpitation, chest discomfort Gastrointestinal:  Denies nausea, heartburn or change in bowel habits Skin: Denies abnormal skin rashes Lymphatics: Denies new lymphadenopathy or easy bruising Neurological:Denies numbness, tingling or new weaknesses Behavioral/Psych: Mood is stable, no new changes  Extremities: No lower extremity edema All other systems were reviewed with the patient and are negative.  PHYSICAL EXAMINATION: ECOG PERFORMANCE STATUS: 0 - Asymptomatic  Vitals:   02/18/16 2040 02/19/16 0555  BP: (!) 150/79 (!) 153/77  Pulse: 73 69  Resp:  16  Temp: 98.6 F (37 C) 97.6 F (36.4 C)   Filed Weights   02/12/16 1600 02/14/16 1638  Weight: 198 lb (89.8 kg) 198 lb (89.8 kg)    GENERAL:alert, no distress and comfortable SKIN: skin color, texture, turgor are normal, no rashes or significant lesions EYES: normal, Conjunctiva are pink and non-injected, sclera clear OROPHARYNX:no exudate, no erythema and lips, buccal mucosa, and tongue normal  NECK: supple, thyroid normal size, non-tender, without nodularity LYMPH:  no palpable lymphadenopathy in the cervical, axillary or inguinal LUNGS: clear to auscultation and percussion with normal breathing effort HEART: regular rate & rhythm and no murmurs and no lower extremity edema ABDOMEN:abdomen soft, non-tender and normal bowel sounds Musculoskeletal:no cyanosis of digits and no clubbing  NEURO: alert & oriented x 3 with fluent speech, no focal motor/sensory deficits  LABORATORY DATA:  I have reviewed  the data as listed CMP Latest Ref Rng & Units 02/19/2016 02/18/2016 02/17/2016  Glucose 65 - 99 mg/dL 146(H) 140(H) 162(H)  BUN 6 - 20 mg/dL 17 17 13   Creatinine 0.61 - 1.24 mg/dL 0.76 0.84 0.70  Sodium 135 - 145 mmol/L 137 139 138  Potassium 3.5 - 5.1 mmol/L 4.0 4.0 3.6  Chloride 101 - 111 mmol/L 100(L) 101 102  CO2 22 - 32 mmol/L 28 30 27   Calcium 8.9 - 10.3 mg/dL 8.9 9.1 8.9  Total Protein 6.5 - 8.1 g/dL 6.6 6.9 6.6  Total Bilirubin 0.3 - 1.2 mg/dL 0.5 0.4 0.7  Alkaline Phos 38 - 126 U/L 65 66 61  AST 15 - 41 U/L 37 43(H) 24  ALT 17 - 63 U/L 47 39 20    Lab Results  Component Value Date   WBC 6.2 02/17/2016   HGB 11.4 (L) 02/17/2016   HCT 33.8 (L) 02/17/2016   MCV 86.7 02/17/2016   PLT 236 02/17/2016   NEUTROABS 4.7 02/17/2016    ASSESSMENT AND PLAN: 1. DLBCL: Atleast stage 3B. BM biopsy pending. Day 3 of EPOCH-R Labs reviewed Monitoring for chemo toxicities. 2. Prior concern for pyelonephritis: No fevers, No flank tenderness 3. HTN: stable Dr.Gorsuch will follow tomorrow.

## 2016-02-19 NOTE — Progress Notes (Signed)
Chemotherapy Dosage and calculations checked and verified with Nancy Marus RN.

## 2016-02-19 NOTE — Progress Notes (Signed)
Chemotherapy dosage and calculations checked and verified with Diana Torres RN. 

## 2016-02-20 LAB — COMPREHENSIVE METABOLIC PANEL
ALT: 53 U/L (ref 17–63)
AST: 36 U/L (ref 15–41)
Albumin: 2.8 g/dL — ABNORMAL LOW (ref 3.5–5.0)
Alkaline Phosphatase: 59 U/L (ref 38–126)
Anion gap: 9 (ref 5–15)
BILIRUBIN TOTAL: 0.5 mg/dL (ref 0.3–1.2)
BUN: 20 mg/dL (ref 6–20)
CHLORIDE: 99 mmol/L — AB (ref 101–111)
CO2: 29 mmol/L (ref 22–32)
Calcium: 9.1 mg/dL (ref 8.9–10.3)
Creatinine, Ser: 0.77 mg/dL (ref 0.61–1.24)
Glucose, Bld: 161 mg/dL — ABNORMAL HIGH (ref 65–99)
Potassium: 4.6 mmol/L (ref 3.5–5.1)
Sodium: 137 mmol/L (ref 135–145)
TOTAL PROTEIN: 6.2 g/dL — AB (ref 6.5–8.1)

## 2016-02-20 LAB — CBC WITH DIFFERENTIAL/PLATELET
BASOS ABS: 0 10*3/uL (ref 0.0–0.1)
BASOS PCT: 0 %
EOS ABS: 0 10*3/uL (ref 0.0–0.7)
Eosinophils Relative: 0 %
HEMATOCRIT: 34.8 % — AB (ref 39.0–52.0)
HEMOGLOBIN: 11.7 g/dL — AB (ref 13.0–17.0)
Lymphocytes Relative: 11 %
Lymphs Abs: 0.9 10*3/uL (ref 0.7–4.0)
MCH: 29.2 pg (ref 26.0–34.0)
MCHC: 33.6 g/dL (ref 30.0–36.0)
MCV: 86.8 fL (ref 78.0–100.0)
MONO ABS: 0.7 10*3/uL (ref 0.1–1.0)
MONOS PCT: 8 %
NEUTROS ABS: 6.7 10*3/uL (ref 1.7–7.7)
NEUTROS PCT: 81 %
Platelets: 294 10*3/uL (ref 150–400)
RBC: 4.01 MIL/uL — ABNORMAL LOW (ref 4.22–5.81)
RDW: 14.1 % (ref 11.5–15.5)
WBC: 8.3 10*3/uL (ref 4.0–10.5)

## 2016-02-20 LAB — LACTATE DEHYDROGENASE: LDH: 199 U/L — AB (ref 98–192)

## 2016-02-20 LAB — URIC ACID: Uric Acid, Serum: 2.6 mg/dL — ABNORMAL LOW (ref 4.4–7.6)

## 2016-02-20 MED ORDER — VINCRISTINE SULFATE CHEMO INJECTION 1 MG/ML
Freq: Once | INTRAVENOUS | Status: AC
  Administered 2016-02-20: 10:00:00 via INTRAVENOUS
  Filled 2016-02-20 (×2): qty 11

## 2016-02-20 MED ORDER — ONDANSETRON HCL 40 MG/20ML IJ SOLN
Freq: Once | INTRAMUSCULAR | Status: AC
  Administered 2016-02-20: 8 mg via INTRAVENOUS
  Filled 2016-02-20: qty 4

## 2016-02-20 NOTE — Progress Notes (Signed)
Dosing and dilution verified for Doxorubicin, Etoposide and Oncovin with 2nd chemo competent RN Nancy Marus.

## 2016-02-20 NOTE — Progress Notes (Signed)
Samuel Berger   DOB:[birth year]   ZO#:[id number]    Subjective: He is doing very well. No side effects so far such as nausea. No evidence of tumor lysis syndrome  Objective:  Vitals:   02/19/16 2158 02/20/16 0608  BP: (!) 141/75 (!) 152/67  Pulse: 69 60  Resp: 18 18  Temp: 97.8 F (36.6 C) 97.7 F (36.5 C)     Intake/Output Summary (Last 24 hours) at 02/20/16 0935 Last data filed at 02/19/16 1500  Gross per 24 hour  Intake           313.33 ml  Output                0 ml  Net           313.33 ml    GENERAL:alert, no distress and comfortable SKIN: skin color, texture, turgor are normal, no rashes or significant lesions EYES: normal, Conjunctiva are pink and non-injected, sclera clear OROPHARYNX:no exudate, no erythema and lips, buccal mucosa, and tongue normal  NECK: supple, thyroid normal size, non-tender, without nodularity LYMPH:  no palpable lymphadenopathy in the cervical, axillary or inguinal LUNGS: clear to auscultation and percussion with normal breathing effort HEART: regular rate & rhythm and no murmurs and no lower extremity edema ABDOMEN:abdomen soft, non-tender and normal bowel sounds Musculoskeletal:no cyanosis of digits and no clubbing  NEURO: alert & oriented x 3 with fluent speech, no focal motor/sensory deficits   Labs:  Lab Results  Component Value Date   WBC 8.3 02/20/2016   HGB 11.7 (L) 02/20/2016   HCT 34.8 (L) 02/20/2016   MCV 86.8 02/20/2016   PLT 294 02/20/2016   NEUTROABS 6.7 02/20/2016    Lab Results  Component Value Date   NA 137 02/20/2016   K 4.6 02/20/2016   CL 99 (L) 02/20/2016   CO2 29 02/20/2016   Assessment & Plan:   Diffuse large B cell lymphoma, at least stage IIIB Significant B symptoms with anorexia, weight loss and night sweats Bone marrow biopsy is negative She tolerated treatment very well with no side effects We will monitor his blood work for tumor lysis syndrome daily  Fever, leukocytosis, resolved Source is  unknown, but with the hydronephrosis seen on the right kidney on CT scan, I suspect this is likely due to possible pyelonephritis He has completed antibiotic therapy  Anorexia with weight loss, protein calorie malnutrition, resolving I will consult dietitian for review. Recommended frequent small meals  Chronic constipation, resolved We discussed management with laxative, increase fiber intake and hydration  History of stroke without residual deficit I recommend stopping Plavix and substitute with 81 mg aspirin. With his recent near-syncopal episode, I suspect is due to hypotension due to poor oral intake and dehydration. We will resume Plavix upon discharge  Essential hypertension I started him on amlodine and hydralazine prn Resume lisinopril  Weakness with near syncopal episode, resolved   Discharge planning  Likely tomorrow after chemotherapy is completed   Heath Lark, MD 02/20/2016  9:35 AM

## 2016-02-20 NOTE — Progress Notes (Signed)
Nutrition Follow-up  DOCUMENTATION CODES:   Severe malnutrition in context of acute illness/injury  INTERVENTION:   Continue Ensure Enlive po BID as desired, each supplement provides 350 kcal and 20 grams of protein Will continue to monitor for any additional needs  NUTRITION DIAGNOSIS:   Inadequate oral intake related to acute illness, poor appetite as evidenced by per patient/family report.  Improved.  GOAL:   Patient will meet greater than or equal to 90% of their needs  Meeting.  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 He was found to have a sepsis like picture with Fever, Elevated WBC, and Hypotension in the ED and rehydrated and placed on IV Vanc/Zosyn in Ed. He was admitted for Syncope likely 2/2 to Dehydration.   Patient in room with family at bedside. Pt eating lunch of pork loin, carrots and mashed potatoes. Pt reports good appetite and eating well. PO intakes have been 100% for the past 48 hours. Pt was started on chemotherapy 11/17. Denies swallowing or chewing issues. Not drinking supplements. Emphasized protein consumption.   Medications reviewed. Labs reviewed: Glucose 161  Diet Order:  Diet Carb Modified Fluid consistency: Thin; Room service appropriate? Yes  Skin:  Reviewed, no issues  Last BM:  11/18  Height:   Ht Readings from Last 1 Encounters:  02/14/16 5\' 11"  (1.803 m)    Weight:   Wt Readings from Last 1 Encounters:  02/14/16 198 lb (89.8 kg)    Ideal Body Weight:  79.54 kg  BMI:  Body mass index is 27.62 kg/m.  Estimated Nutritional Needs:    Kcal:  2250-2450  Protein:  120-130g  Fluid:  2.-2.2L/day  EDUCATION NEEDS:   No education needs identified at this time  Clayton Bibles, MS, RD, LDN Pager: [phone] After Hours Pager: [phone]

## 2016-02-21 ENCOUNTER — Other Ambulatory Visit: Payer: Self-pay | Admitting: Hematology and Oncology

## 2016-02-21 LAB — COMPREHENSIVE METABOLIC PANEL
ALT: 43 U/L (ref 17–63)
ANION GAP: 7 (ref 5–15)
AST: 27 U/L (ref 15–41)
Albumin: 2.8 g/dL — ABNORMAL LOW (ref 3.5–5.0)
Alkaline Phosphatase: 54 U/L (ref 38–126)
BUN: 20 mg/dL (ref 6–20)
CHLORIDE: 101 mmol/L (ref 101–111)
CO2: 29 mmol/L (ref 22–32)
CREATININE: 0.62 mg/dL (ref 0.61–1.24)
Calcium: 9 mg/dL (ref 8.9–10.3)
Glucose, Bld: 155 mg/dL — ABNORMAL HIGH (ref 65–99)
POTASSIUM: 4 mmol/L (ref 3.5–5.1)
Sodium: 137 mmol/L (ref 135–145)
Total Bilirubin: 0.8 mg/dL (ref 0.3–1.2)
Total Protein: 6 g/dL — ABNORMAL LOW (ref 6.5–8.1)

## 2016-02-21 LAB — URIC ACID: URIC ACID, SERUM: 2.9 mg/dL — AB (ref 4.4–7.6)

## 2016-02-21 LAB — LACTATE DEHYDROGENASE: LDH: 203 U/L — AB (ref 98–192)

## 2016-02-21 MED ORDER — DEXAMETHASONE 4 MG PO TABS: 8.0000 mg | ORAL_TABLET | Freq: Two times a day (BID) | ORAL | 1 refills | Status: DC

## 2016-02-21 MED ORDER — LIDOCAINE-PRILOCAINE 2.5-2.5 % EX CREA: 1.0000 "application " | TOPICAL_CREAM | CUTANEOUS | 6 refills | Status: DC | PRN

## 2016-02-21 MED ORDER — ONDANSETRON HCL 8 MG PO TABS: 8.0000 mg | ORAL_TABLET | Freq: Three times a day (TID) | ORAL | 1 refills | Status: DC | PRN

## 2016-02-21 MED ORDER — ONDANSETRON HCL 40 MG/20ML IJ SOLN
Freq: Once | INTRAMUSCULAR | Status: AC
  Administered 2016-02-21: 16 mg via INTRAVENOUS
  Filled 2016-02-21: qty 8

## 2016-02-21 MED ORDER — ALLOPURINOL 300 MG PO TABS: 300.0000 mg | ORAL_TABLET | Freq: Every day | ORAL | 0 refills | Status: DC

## 2016-02-21 MED ORDER — SODIUM CHLORIDE 0.9 % IV SOLN
750.0000 mg/m2 | Freq: Once | INTRAVENOUS | Status: AC
  Administered 2016-02-21: 1600 mg via INTRAVENOUS
  Filled 2016-02-21: qty 80

## 2016-02-21 MED ORDER — PROCHLORPERAZINE MALEATE 10 MG PO TABS: 10.0000 mg | ORAL_TABLET | Freq: Four times a day (QID) | ORAL | 1 refills | Status: DC | PRN

## 2016-02-21 MED ORDER — PROCHLORPERAZINE MALEATE 10 MG PO TABS
10.0000 mg | ORAL_TABLET | Freq: Four times a day (QID) | ORAL | 3 refills | Status: DC | PRN
Start: 2016-02-21 — End: 2016-05-15

## 2016-02-21 MED ORDER — ONDANSETRON HCL 8 MG PO TABS: 8.0000 mg | ORAL_TABLET | Freq: Three times a day (TID) | ORAL | 6 refills | Status: DC | PRN

## 2016-02-21 NOTE — Discharge Summary (Signed)
Physician Discharge Summary  Patient ID: Samuel Berger MRN: [id number] DOB/AGE: 06-08-1945 70 y.o.  Admit date: 02/12/2016 Discharge date: 02/21/2016  Primary Care Physician:  Wenda Low, MD   Discharge Diagnoses:    Present on Admission: . Large B-cell lymphoma ([place]) . Syncope   Discharge Medications:    Medication List    TAKE these medications   allopurinol 300 MG tablet Commonly known as:  ZYLOPRIM Take 1 tablet (300 mg total) by mouth daily. Start taking on:  02/22/2016   amLODipine 10 MG tablet Commonly known as:  NORVASC Take 10 mg by mouth daily.   atorvastatin 40 MG tablet Commonly known as:  LIPITOR Take 40 mg by mouth daily at 6 PM.   clopidogrel 75 MG tablet Commonly known as:  PLAVIX Take 75 mg by mouth daily with breakfast.   dexamethasone 4 MG tablet Commonly known as:  DECADRON Take 2 tablets (8 mg total) by mouth 2 (two) times daily with a meal. Take two times a day starting the day after chemotherapy for 3 days.   lidocaine-prilocaine cream Commonly known as:  EMLA Apply 1 application topically as needed.   lisinopril 5 MG tablet Commonly known as:  PRINIVIL,ZESTRIL Take 5 mg by mouth daily.   ondansetron 8 MG tablet Commonly known as:  ZOFRAN Take 1 tablet (8 mg total) by mouth every 8 (eight) hours as needed.   ondansetron 8 MG tablet Commonly known as:  ZOFRAN Take 1 tablet (8 mg total) by mouth every 8 (eight) hours as needed for nausea or vomiting.   pioglitazone 30 MG tablet Commonly known as:  ACTOS Take 30 mg by mouth daily.   prochlorperazine 10 MG tablet Commonly known as:  COMPAZINE Take 1 tablet (10 mg total) by mouth every 6 (six) hours as needed (Nausea or vomiting).   prochlorperazine 10 MG tablet Commonly known as:  COMPAZINE Take 1 tablet (10 mg total) by mouth every 6 (six) hours as needed.       Disposition and Follow-up:   Significant Diagnostic Studies:  Dg Chest 2 View  Result Date:  02/12/2016 CLINICAL DATA:  Fever and weakness. EXAM: CHEST  2 VIEW COMPARISON:  01/27/2016 FINDINGS: The heart size and mediastinal contours are within normal limits. Both lungs are clear. The visualized skeletal structures are unremarkable. IMPRESSION: No active cardiopulmonary disease. Electronically Signed   By: Kerby Moors M.D.   On: 02/12/2016 11:52   Ct Chest W Contrast  Result Date: 01/27/2016 CLINICAL DATA:  Patient recently treated for pneumonia. Weakness and fatigue over the past month. EXAM: CT CHEST WITH CONTRAST TECHNIQUE: Multidetector CT imaging of the chest was performed during intravenous contrast administration. CONTRAST:  62m ISOVUE-300 IOPAMIDOL (ISOVUE-300) INJECTION 61% COMPARISON:  CT abdomen/ pelvis 01/06/2016 and 03/07/2015 FINDINGS: Cardiovascular: Heart hole in size. Calcified plaque is present over the left anterior descending, lateral circumflex and right coronary arteries. Mild calcified plaque over the thoracic aorta. Pulmonary arterial system is unremarkable. Mediastinum/Nodes: No evidence of mediastinal or hilar adenopathy. Remaining mediastinal structures are within normal. Few small hypodense nodules of the thyroid with the largest measuring 1 point 2 cm over the left lobe. Oval density over the left neck base at the level of the thyroid measuring 2.5 x 4.5 cm likely due in part to prominent left jugular vein although cannot exclude adjacent adenopathy. Lungs/Pleura: Lungs are adequately inflated without focal consolidation or effusion. 2 mm subpleural nodule over the lateral right upper lobe. Airways are within normal. Upper Abdomen: Minimal  cholelithiasis. 2.4 cm cyst over the upper pole left kidney. Sub cm hypodensity over the mid pole cortex right kidney too small to characterize but likely a cyst and unchanged. Aortic atherosclerosis. Retrocrural and periaortic adenopathy unchanged. Musculoskeletal: Mild degenerate change of the spine. IMPRESSION: No acute  cardiopulmonary disease. Known stable retrocrural and periaortic adenopathy. Oval density over the left neck base measuring 2.5 x 4.5 cm likely due partially to prominent internal jugular vein, although cannot exclude adenopathy in this region. Recommend neck CT with contrast. Three vessel atherosclerotic coronary artery disease. Few small thyroid nodules with the largest measuring 1.2 cm over the left lobe. 2.4 cm left renal cyst. Sub cm right renal hypodensity unchanged and too small to characterize but likely a cyst. Minimal cholelithiasis. Electronically Signed   By: Marin Olp M.D.   On: 01/27/2016 16:33   Ct Abdomen Pelvis W Contrast  Result Date: 02/14/2016 CLINICAL DATA:  Syncope, dehydration, sepsis. Diffuse large B-cell lymphoma. EXAM: CT ABDOMEN AND PELVIS WITH CONTRAST TECHNIQUE: Multidetector CT imaging of the abdomen and pelvis was performed using the standard protocol following bolus administration of intravenous contrast. CONTRAST:  170m ISOVUE-300 IOPAMIDOL (ISOVUE-300) INJECTION 61% COMPARISON:  01/06/2016 FINDINGS: Lower chest: Small right pleural effusion. Hepatobiliary: Liver is within normal limits. Tiny layering gallstones (series 2/ image 20), without associated inflammatory changes. No intrahepatic extrahepatic ductal dilatation. Pancreas: Within normal limits. Spleen: Spleen is normal in size. Adrenals/Urinary Tract: Adrenal glands are within normal limits. 2.6 cm medial right upper pole renal cyst (series 2/ image 25). 9 mm anterior right lower pole renal cyst (series 2/ image 36). Mild diminished/delayed enhancement of the right kidney. Associated mild right hydronephrosis, new, likely secondary to extrinsic compression from right retroperitoneal lymphadenopathy. Mildly thick-walled bladder. Stomach/Bowel: Stomach is within normal limits. No evidence of bowel obstruction. Appendix is not discretely visualized. Sigmoid diverticulosis, without evidence of diverticulitis.  Vascular/Lymphatic: No evidence of abdominal aortic aneurysm. Atherosclerotic calcifications of the abdominal aorta and branch vessels. Widespread abdominopelvic lymphadenopathy, including: --14 mm short axis right retrocrural node (series 2/ image 15), previously 17 mm --19 mm short axis left para-aortic node (series 2/ image 37), previously 27 mm, no longer necrotic --16 mm short axis aortocaval node (series 2/image 45), previously 18 mm, now necrotic --22 mm short axis left common iliac node (series 2/ image 55), previously 25 mm, now with increased necrosis --33 mm short axis right common iliac node (series 2/image 58), previously 24 mm, now necrotic --21 mm short axis right external iliac node (series 2/ image 69), previously 33 mm, no longer necrotic --29 mm short axis left obturator node (series 2/ image 35), previously 23 mm Reproductive: Prostate is grossly unremarkable. Other: Small volume pelvic ascites (series 2/image 76), increased. Mild stranding along the right pericolic gutter (series 2/ image 51). Moderate fat containing left inguinal hernia, now with trace fluid inferiorly (series 2/ image 89). Musculoskeletal: Degenerative changes of the visualized thoracolumbar spine. IMPRESSION: Widespread retrocrural, retroperitoneal, and bilateral pelvic lymphadenopathy, waxing/waning when compared to recent CT, as above. Mild right hydronephrosis, likely secondary to extrinsic compression by right retroperitoneal lymphadenopathy, with associated diminished enhancement of the right kidney. Spleen is normal in size. Additional ancillary findings as above. Electronically Signed   By: SJulian HyM.D.   On: 02/14/2016 17:35   Ir UK[place]Guide Vasc Access Right  Result Date: 02/16/2016 CLINICAL DATA:  Large B-cell lymphoma and need for porta cath to begin chemotherapy. EXAM: IMPLANTED PORT A CATH PLACEMENT WITH ULTRASOUND AND FLUOROSCOPIC GUIDANCE ANESTHESIA/SEDATION:  5.0 mg IV Versed; 100 mcg IV Fentanyl  Total Moderate Sedation Time:  30 minutes The patient's level of consciousness and physiologic status were continuously monitored during the procedure by Radiology nursing. Additional Medications: 2 g IV Ancef. As antibiotic prophylaxis, Ancef was ordered pre-procedure and administered intravenously within one hour of incision. FLUOROSCOPY TIME:  12 seconds.  4.0 mGy. PROCEDURE: The procedure, risks, benefits, and alternatives were explained to the patient. Questions regarding the procedure were encouraged and answered. The patient understands and consents to the procedure. A time-out was performed prior to initiating the procedure. Ultrasound was utilized to confirm patency of the right internal jugular vein. The right neck and chest were prepped with chlorhexidine in a sterile fashion, and a sterile drape was applied covering the operative field. Maximum barrier sterile technique with sterile gowns and gloves were used for the procedure. Local anesthesia was provided with 1% lidocaine. After creating a small venotomy incision, a 21 gauge needle was advanced into the right internal jugular vein under direct, real-time ultrasound guidance. Ultrasound image documentation was performed. After securing guidewire access, an 8 Fr dilator was placed. A J-wire was kinked to measure appropriate catheter length. A subcutaneous port pocket was then created along the upper chest wall utilizing sharp and blunt dissection. Portable cautery was utilized. The pocket was irrigated with sterile saline. A single lumen power injectable port was chosen for placement. The 8 Fr catheter was tunneled from the port pocket site to the venotomy incision. The port was placed in the pocket. External catheter was trimmed to appropriate length based on guidewire measurement. At the venotomy, an 8 Fr peel-away sheath was placed over a guidewire. The catheter was then placed through the sheath and the sheath removed. Final catheter positioning was  confirmed and documented with a fluoroscopic spot image. The port was accessed with a needle and aspirated and flushed with heparinized saline. The access needle was removed. The venotomy and port pocket incisions were closed with subcutaneous 3-0 Monocryl and subcuticular 4-0 Vicryl. Dermabond was applied to both incisions. COMPLICATIONS: COMPLICATIONS None FINDINGS: After catheter placement, the tip lies at the cavo-atrial junction. The catheter aspirates normally and is ready for immediate use. IMPRESSION: Placement of single lumen port a cath via right internal jugular vein. The catheter tip lies at the cavo-atrial junction. A power injectable port a cath was placed and is ready for immediate use. Electronically Signed   By: Aletta Edouard M.D.   On: 02/16/2016 15:28   Ir US Guide Bx Asp/drain  Result Date: 02/07/2016 INDICATION: Left supraclavicular and retroperitoneal adenopathy, concern for lymphoproliferative process. EXAM: IR ULTRASOUND GUIDANCE MEDICATIONS: 1 mg Versed, 25 mcg fentanyl ANESTHESIA/SEDATION: Moderate (conscious) sedation was employed during this procedure. A total of Versed 1.0 mg and Fentanyl 25 mcg was administered intravenously. Moderate Sedation Time: 11 minutes. The patient's level of consciousness and vital signs were monitored continuously by radiology nursing throughout the procedure under my direct supervision. FLUOROSCOPY TIME:  Fluoroscopy Time: None. COMPLICATIONS: None immediate. PROCEDURE: Informed written consent was obtained from the patient after a thorough discussion of the procedural risks, benefits and alternatives. All questions were addressed. Maximal Sterile Barrier Technique was utilized including caps, mask, sterile gowns, sterile gloves, sterile drape, hand hygiene and skin antiseptic. A timeout was performed prior to the initiation of the procedure. Previous imaging reviewed. Preliminary ultrasound performed. The left supraclavicular nodal mass was localized.  Overlying skin marked. Under sterile conditions and local anesthesia, an 18 gauge core biopsy was advanced under  direct ultrasound into the left supraclavicular nodal mass. Several 18 gauge core biopsies obtained under direct ultrasound. Images obtained for documentation. Samples placed in saline. Needle removed. Postprocedure imaging demonstrates no large hematoma. Patient tolerated the biopsy well. No immediate complication. IMPRESSION: Successful ultrasound left supraclavicular nodal mass 18 gauge core biopsies Electronically Signed   By: Jerilynn Mages.  Shick M.D.   On: 02/07/2016 12:25   Ir Fluoro Guide Port Insertion Right  Result Date: 02/16/2016 CLINICAL DATA:  Large B-cell lymphoma and need for porta cath to begin chemotherapy. EXAM: IMPLANTED PORT A CATH PLACEMENT WITH ULTRASOUND AND FLUOROSCOPIC GUIDANCE ANESTHESIA/SEDATION: 5.0 mg IV Versed; 100 mcg IV Fentanyl Total Moderate Sedation Time:  30 minutes The patient's level of consciousness and physiologic status were continuously monitored during the procedure by Radiology nursing. Additional Medications: 2 g IV Ancef. As antibiotic prophylaxis, Ancef was ordered pre-procedure and administered intravenously within one hour of incision. FLUOROSCOPY TIME:  12 seconds.  4.0 mGy. PROCEDURE: The procedure, risks, benefits, and alternatives were explained to the patient. Questions regarding the procedure were encouraged and answered. The patient understands and consents to the procedure. A time-out was performed prior to initiating the procedure. Ultrasound was utilized to confirm patency of the right internal jugular vein. The right neck and chest were prepped with chlorhexidine in a sterile fashion, and a sterile drape was applied covering the operative field. Maximum barrier sterile technique with sterile gowns and gloves were used for the procedure. Local anesthesia was provided with 1% lidocaine. After creating a small venotomy incision, a 21 gauge needle was  advanced into the right internal jugular vein under direct, real-time ultrasound guidance. Ultrasound image documentation was performed. After securing guidewire access, an 8 Fr dilator was placed. A J-wire was kinked to measure appropriate catheter length. A subcutaneous port pocket was then created along the upper chest wall utilizing sharp and blunt dissection. Portable cautery was utilized. The pocket was irrigated with sterile saline. A single lumen power injectable port was chosen for placement. The 8 Fr catheter was tunneled from the port pocket site to the venotomy incision. The port was placed in the pocket. External catheter was trimmed to appropriate length based on guidewire measurement. At the venotomy, an 8 Fr peel-away sheath was placed over a guidewire. The catheter was then placed through the sheath and the sheath removed. Final catheter positioning was confirmed and documented with a fluoroscopic spot image. The port was accessed with a needle and aspirated and flushed with heparinized saline. The access needle was removed. The venotomy and port pocket incisions were closed with subcutaneous 3-0 Monocryl and subcuticular 4-0 Vicryl. Dermabond was applied to both incisions. COMPLICATIONS: COMPLICATIONS None FINDINGS: After catheter placement, the tip lies at the cavo-atrial junction. The catheter aspirates normally and is ready for immediate use. IMPRESSION: Placement of single lumen port a cath via right internal jugular vein. The catheter tip lies at the cavo-atrial junction. A power injectable port a cath was placed and is ready for immediate use. Electronically Signed   By: Aletta Edouard M.D.   On: 02/16/2016 15:28    Discharge Laboratory Values: Lab Results  Component Value Date   WBC 8.3 02/20/2016   HGB 11.7 (L) 02/20/2016   HCT 34.8 (L) 02/20/2016   MCV 86.8 02/20/2016   PLT 294 02/20/2016   Lab Results  Component Value Date   NA 137 02/21/2016   K 4.0 02/21/2016   CL 101  02/21/2016   CO2 29 02/21/2016    Brief  H and P: For complete details please refer to admission H and P, but in brief, The patient was admitted to the hospital on 02/12/2016 after presentation with significant weakness and near-syncopal episode. He was recently diagnosed with diffuse large B-cell lymphoma. In the early presentation to the emergency department, he was noted to have leukocytosis with fever. Source of infection is unknown but leukocytosis and fever resolved with broad-spectrum IV antibiotics  While hospitalized, he had extensive workup including bone marrow biopsy and port placement and received cycle 1 of chemotherapy in the hospital. He tolerated treatment well with no signs of tumor lysis syndrome  Physical Exam at Discharge: BP 128/78 (BP Location: Left Arm)   Pulse (!) 58   Temp 97.6 F (36.4 C) (Oral)   Resp 14   Ht _0  (1.803 m)   Wt 198 lb (89.8 kg)   SpO2 99%   BMI 27.62 kg/m  GENERAL:alert, no distress and comfortable SKIN: skin color, texture, turgor are normal, no rashes or significant lesions EYES: normal, Conjunctiva are pink and non-injected, sclera clear OROPHARYNX:no exudate, no erythema and lips, buccal mucosa, and tongue normal  NECK: supple, thyroid normal size, non-tender, without nodularity LYMPH:  no palpable lymphadenopathy in the cervical, axillary or inguinal LUNGS: clear to auscultation and percussion with normal breathing effort HEART: regular rate & rhythm and no murmurs and no lower extremity edema ABDOMEN:abdomen soft, non-tender and normal bowel sounds Musculoskeletal:no cyanosis of digits and no clubbing  NEURO: alert & oriented x 3 with fluent speech, no focal motor/sensory deficits  Hospital Course:  Active Problems:   Large B-cell lymphoma ([facility])   Syncope   Diffuse large B-cell lymphoma of lymph nodes of multiple regions ([facility])   Hyponatremia   Protein-calorie malnutrition, severe   Diet:  Regular  Activity:  As  tolerated  Condition at Discharge:   stable  Signed: Dr. Heath Lark [phone]  02/21/2016, 12:36 PM

## 2016-02-21 NOTE — Progress Notes (Signed)
This RN verifieded actual chemo amount, concentration and total dose versus ordered. Verified by Jena Gauss, RN. Patient educated, Consent signed, questions answered

## 2016-02-24 ENCOUNTER — Ambulatory Visit (HOSPITAL_BASED_OUTPATIENT_CLINIC_OR_DEPARTMENT_OTHER): Payer: Medicare Other

## 2016-02-24 ENCOUNTER — Other Ambulatory Visit (HOSPITAL_BASED_OUTPATIENT_CLINIC_OR_DEPARTMENT_OTHER): Payer: Medicare Other

## 2016-02-24 VITALS — BP 137/76 | HR 84 | Temp 97.9°F | Resp 20

## 2016-02-24 DIAGNOSIS — C8338 Diffuse large B-cell lymphoma, lymph nodes of multiple sites: Secondary | ICD-10-CM

## 2016-02-24 DIAGNOSIS — C8339 Diffuse large B-cell lymphoma, extranodal and solid organ sites: Secondary | ICD-10-CM | POA: Diagnosis present

## 2016-02-24 DIAGNOSIS — C8510 Unspecified B-cell lymphoma, unspecified site: Secondary | ICD-10-CM

## 2016-02-24 LAB — COMPREHENSIVE METABOLIC PANEL
ALK PHOS: 63 U/L (ref 40–150)
ALT: 30 U/L (ref 0–55)
ANION GAP: 10 meq/L (ref 3–11)
AST: 11 U/L (ref 5–34)
Albumin: 3 g/dL — ABNORMAL LOW (ref 3.5–5.0)
BILIRUBIN TOTAL: 0.53 mg/dL (ref 0.20–1.20)
BUN: 24.2 mg/dL (ref 7.0–26.0)
CALCIUM: 9.5 mg/dL (ref 8.4–10.4)
CHLORIDE: 97 meq/L — AB (ref 98–109)
CO2: 27 mEq/L (ref 22–29)
CREATININE: 0.7 mg/dL (ref 0.7–1.3)
EGFR: >90 mL/min/{1.73_m2} (ref >90)
Glucose: 149 mg/dl — ABNORMAL HIGH (ref 70–140)
Potassium: 4.3 mEq/L (ref 3.5–5.1)
Sodium: 135 mEq/L — ABNORMAL LOW (ref 136–145)
Total Protein: 6.7 g/dL (ref 6.4–8.3)

## 2016-02-24 LAB — CBC WITH DIFFERENTIAL/PLATELET
BASO%: 0 % (ref 0.0–2.0)
BASOS ABS: 0 10*3/uL (ref 0.0–0.1)
EOS%: 0.2 % (ref 0.0–7.0)
Eosinophils Absolute: 0 10*3/uL (ref 0.0–0.5)
HEMATOCRIT: 34.9 % — AB (ref 38.4–49.9)
HGB: 11.9 g/dL — ABNORMAL LOW (ref 13.0–17.1)
LYMPH#: 0.7 10*3/uL — AB (ref 0.9–3.3)
LYMPH%: 5.6 % — ABNORMAL LOW (ref 14.0–49.0)
MCH: 28.9 pg (ref 27.2–33.4)
MCHC: 34.1 g/dL (ref 32.0–36.0)
MCV: 84.7 fL (ref 79.3–98.0)
MONO#: 0 10*3/uL — AB (ref 0.1–0.9)
MONO%: 0.2 % (ref 0.0–14.0)
NEUT#: 11.6 10*3/uL — ABNORMAL HIGH (ref 1.5–6.5)
NEUT%: 94 % — AB (ref 39.0–75.0)
PLATELETS: 268 10*3/uL (ref 140–400)
RBC: 4.12 10*6/uL — ABNORMAL LOW (ref 4.20–5.82)
RDW: 14.2 % (ref 11.0–14.6)
WBC: 12.4 10*3/uL — ABNORMAL HIGH (ref 4.0–10.3)

## 2016-02-24 MED ORDER — PEGFILGRASTIM INJECTION 6 MG/0.6ML ~~LOC~~
6.0000 mg | PREFILLED_SYRINGE | Freq: Once | SUBCUTANEOUS | Status: AC
  Administered 2016-02-24: 6 mg via SUBCUTANEOUS
  Filled 2016-02-24: qty 0.6

## 2016-02-24 NOTE — Patient Instructions (Signed)
Pegfilgrastim injection What is this medicine? PEGFILGRASTIM (PEG fil gra stim) is a long-acting granulocyte colony-stimulating factor that stimulates the growth of neutrophils, a type of white blood cell important in the body's fight against infection. It is used to reduce the incidence of fever and infection in patients with certain types of cancer who are receiving chemotherapy that affects the bone marrow, and to increase survival after being exposed to high doses of radiation. This medicine may be used for other purposes; ask your health care provider or pharmacist if you have questions. COMMON BRAND NAME(S): Neulasta What should I tell my health care provider before I take this medicine? They need to know if you have any of these conditions: -kidney disease -latex allergy -ongoing radiation therapy -sickle cell disease -skin reactions to acrylic adhesives (On-Body Injector only) -an unusual or allergic reaction to pegfilgrastim, filgrastim, other medicines, foods, dyes, or preservatives -pregnant or trying to get pregnant -breast-feeding How should I use this medicine? This medicine is for injection under the skin. If you get this medicine at home, you will be taught how to prepare and give the pre-filled syringe or how to use the On-body Injector. Refer to the patient Instructions for Use for detailed instructions. Use exactly as directed. Take your medicine at regular intervals. Do not take your medicine more often than directed. It is important that you put your used needles and syringes in a special sharps container. Do not put them in a trash can. If you do not have a sharps container, call your pharmacist or healthcare provider to get one. Talk to your pediatrician regarding the use of this medicine in children. While this drug may be prescribed for selected conditions, precautions do apply. Overdosage: If you think you have taken too much of this medicine contact a poison control  center or emergency room at once. NOTE: This medicine is only for you. Do not share this medicine with others. What if I miss a dose? It is important not to miss your dose. Call your doctor or health care professional if you miss your dose. If you miss a dose due to an On-body Injector failure or leakage, a new dose should be administered as soon as possible using a single prefilled syringe for manual use. What may interact with this medicine? Interactions have not been studied. Give your health care provider a list of all the medicines, herbs, non-prescription drugs, or dietary supplements you use. Also tell them if you smoke, drink alcohol, or use illegal drugs. Some items may interact with your medicine. This list may not describe all possible interactions. Give your health care provider a list of all the medicines, herbs, non-prescription drugs, or dietary supplements you use. Also tell them if you smoke, drink alcohol, or use illegal drugs. Some items may interact with your medicine. What should I watch for while using this medicine? You may need blood work done while you are taking this medicine. If you are going to need a MRI, CT scan, or other procedure, tell your doctor that you are using this medicine (On-Body Injector only). What side effects may I notice from receiving this medicine? Side effects that you should report to your doctor or health care professional as soon as possible: -allergic reactions like skin rash, itching or hives, swelling of the face, lips, or tongue -dizziness -fever -pain, redness, or irritation at site where injected -pinpoint red spots on the skin -red or dark-brown urine -shortness of breath or breathing problems -stomach or   side pain, or pain at the shoulder -swelling -tiredness -trouble passing urine or change in the amount of urine Side effects that usually do not require medical attention (report to your doctor or health care professional if they  continue or are bothersome): -bone pain -muscle pain This list may not describe all possible side effects. Call your doctor for medical advice about side effects. You may report side effects to FDA at 1-800-FDA-1088. Where should I keep my medicine? Keep out of the reach of children. Store pre-filled syringes in a refrigerator between 2 and 8 degrees C (36 and 46 degrees F). Do not freeze. Keep in carton to protect from light. Throw away this medicine if it is left out of the refrigerator for more than 48 hours. Throw away any unused medicine after the expiration date. NOTE: This sheet is a summary. It may not cover all possible information. If you have questions about this medicine, talk to your doctor, pharmacist, or health care provider.  2017 Elsevier/Gold Standard (2014-04-08 14:30:14)  

## 2016-02-27 ENCOUNTER — Telehealth: Payer: Self-pay | Admitting: *Deleted

## 2016-02-27 NOTE — Telephone Encounter (Signed)
Pt reports constipation w/ no BM x 5 days.  He has tried prune juice and one dose of MOM w/o any results.  Instructed pt per Dr. Alvy Bimler to take Miralax once a day and add Senokot-S 2 tablets twice daily.  Instructed pt to start this afternoon/evening and repeat in morning if no results.  Drink plenty of fluids.  Will f/u tomorrow pt see Dr. Alvy Bimler in office tomorrow.  Pt verbalized understanding.

## 2016-02-28 ENCOUNTER — Telehealth: Payer: Self-pay | Admitting: Hematology and Oncology

## 2016-02-28 ENCOUNTER — Ambulatory Visit (HOSPITAL_BASED_OUTPATIENT_CLINIC_OR_DEPARTMENT_OTHER): Payer: Medicare Other | Admitting: Hematology and Oncology

## 2016-02-28 ENCOUNTER — Encounter: Payer: Self-pay | Admitting: Hematology and Oncology

## 2016-02-28 ENCOUNTER — Ambulatory Visit: Payer: Medicare Other

## 2016-02-28 ENCOUNTER — Other Ambulatory Visit: Payer: Self-pay | Admitting: Hematology and Oncology

## 2016-02-28 ENCOUNTER — Encounter (HOSPITAL_COMMUNITY): Payer: Medicare Other

## 2016-02-28 ENCOUNTER — Other Ambulatory Visit (HOSPITAL_BASED_OUTPATIENT_CLINIC_OR_DEPARTMENT_OTHER): Payer: Medicare Other

## 2016-02-28 VITALS — BP 111/75 | HR 95 | Temp 97.8°F | Resp 15 | Ht 71.0 in | Wt 195.0 lb

## 2016-02-28 DIAGNOSIS — C8339 Diffuse large B-cell lymphoma, extranodal and solid organ sites: Secondary | ICD-10-CM | POA: Diagnosis not present

## 2016-02-28 DIAGNOSIS — D61818 Other pancytopenia: Secondary | ICD-10-CM | POA: Diagnosis not present

## 2016-02-28 DIAGNOSIS — B370 Candidal stomatitis: Secondary | ICD-10-CM

## 2016-02-28 DIAGNOSIS — Z95828 Presence of other vascular implants and grafts: Secondary | ICD-10-CM

## 2016-02-28 DIAGNOSIS — C8338 Diffuse large B-cell lymphoma, lymph nodes of multiple sites: Secondary | ICD-10-CM

## 2016-02-28 DIAGNOSIS — C8510 Unspecified B-cell lymphoma, unspecified site: Secondary | ICD-10-CM

## 2016-02-28 LAB — COMPREHENSIVE METABOLIC PANEL
ALT: 19 U/L (ref 0–55)
AST: 11 U/L (ref 5–34)
Albumin: 3 g/dL — ABNORMAL LOW (ref 3.5–5.0)
Alkaline Phosphatase: 65 U/L (ref 40–150)
Anion Gap: 10 mEq/L (ref 3–11)
BILIRUBIN TOTAL: 0.63 mg/dL (ref 0.20–1.20)
BUN: 17.5 mg/dL (ref 7.0–26.0)
CO2: 26 meq/L (ref 22–29)
Calcium: 9.1 mg/dL (ref 8.4–10.4)
Chloride: 96 mEq/L — ABNORMAL LOW (ref 98–109)
Creatinine: 0.7 mg/dL (ref 0.7–1.3)
GLUCOSE: 135 mg/dL (ref 70–140)
Potassium: 4.2 mEq/L (ref 3.5–5.1)
SODIUM: 131 meq/L — AB (ref 136–145)
TOTAL PROTEIN: 6.3 g/dL — AB (ref 6.4–8.3)

## 2016-02-28 LAB — CBC WITH DIFFERENTIAL/PLATELET
BASO%: 1.5 % (ref 0.0–2.0)
BASOS ABS: 0 10*3/uL (ref 0.0–0.1)
EOS%: 1.5 % (ref 0.0–7.0)
Eosinophils Absolute: 0 10*3/uL (ref 0.0–0.5)
HCT: 34.2 % — ABNORMAL LOW (ref 38.4–49.9)
HEMOGLOBIN: 11.8 g/dL — AB (ref 13.0–17.1)
LYMPH#: 0.3 10*3/uL — AB (ref 0.9–3.3)
LYMPH%: 39.7 % (ref 14.0–49.0)
MCH: 28.9 pg (ref 27.2–33.4)
MCHC: 34.5 g/dL (ref 32.0–36.0)
MCV: 83.6 fL (ref 79.3–98.0)
MONO#: 0.1 10*3/uL (ref 0.1–0.9)
MONO%: 17.6 % — ABNORMAL HIGH (ref 0.0–14.0)
NEUT%: 39.7 % (ref 39.0–75.0)
NEUTROS ABS: 0.3 10*3/uL — AB (ref 1.5–6.5)
NRBC: 0 % (ref 0–0)
Platelets: 84 10*3/uL — ABNORMAL LOW (ref 140–400)
RBC: 4.09 10*6/uL — ABNORMAL LOW (ref 4.20–5.82)
RDW: 14.2 % (ref 11.0–14.6)
WBC: 0.7 10*3/uL — AB (ref 4.0–10.3)

## 2016-02-28 LAB — CHROMOSOME ANALYSIS, BONE MARROW

## 2016-02-28 MED ORDER — SODIUM CHLORIDE 0.9% FLUSH
10.0000 mL | INTRAVENOUS | Status: DC | PRN
  Administered 2016-02-28: 10 mL via INTRAVENOUS
  Filled 2016-02-28: qty 10

## 2016-02-28 MED ORDER — HEPARIN SOD (PORK) LOCK FLUSH 100 UNIT/ML IV SOLN
500.0000 [IU] | Freq: Once | INTRAVENOUS | Status: AC
  Administered 2016-02-28: 500 [IU] via INTRAVENOUS
  Filled 2016-02-28: qty 5

## 2016-02-28 MED ORDER — FLUCONAZOLE 100 MG PO TABS: 100.0000 mg | ORAL_TABLET | Freq: Every day | ORAL | 0 refills | Status: DC

## 2016-02-28 NOTE — Assessment & Plan Note (Signed)
This is related to recent treatment. He does not require blood transfusion. He will get 1 unit of blood if hemoglobin is less than 8 and 1 unit of platelets if he has bleeding or platelet count less than 10,000. He needs irradiated blood products. We discussed neutropenic precautions

## 2016-02-28 NOTE — Assessment & Plan Note (Signed)
He is currently neutropenic. Examination revealed oral candidiasis. I will proceed to treat that. We discussed neutropenic precaution. I will see him back again next week and if it blood count recovers, he will be readmitted to the hospital around 03/08/2016 for cycle 2 of treatment

## 2016-02-28 NOTE — Telephone Encounter (Signed)
Appointments scheduled per 02/28/16 los. A copy of the AVS report and appointment schedule was given to patient, per 02/28/16 los. °

## 2016-02-28 NOTE — Assessment & Plan Note (Signed)
He has oral candidiasis. I will proceed to treat him with fluconazole

## 2016-02-28 NOTE — Progress Notes (Signed)
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 Stable ectasia or post stenotic dilatation of the right external iliac artery measuring up to 1.5 cm. Extensive atherosclerotic disease involving the abdominal aorta with a chronic intimal flap or short segment dissection in the infrarenal abdominal aorta. Chronic occlusion of the left common iliac artery. Slowly enlarging retroperitoneal lymph nodes as described. An indolent neoplastic or lymphoproliferative process cannot be excluded      01/06/2016 Imaging    Ct abdomen and pelvis showed new extensive centrally necrotic retroperitoneal and bilateral pelvic lymphadenopathy. Differential includes lymphoproliferative condition (lymphoma/leukemia) or infectious lymphadenitis (such as due to bacterial, viral or mycobacterial etiologies). Squamous cell carcinoma metastases can have a similar appearance, although this is less likely given the location. Clinical and laboratory evaluation for a lymphoproliferative condition and tissue sampling is advised. 2. Aortic atherosclerosis. Stable 3.1 cm infrarenal abdominal aortic aneurysm      01/27/2016 Imaging    CH chest showed no acute cardiopulmonary disease. Known stable retrocrural and periaortic adenopathy. Oval density over the left neck base measuring 2.5 x 4.5 cm likely due partially to prominent internal jugular vein, although cannot exclude adenopathy in this region. Recommend neck CT with contrast. Three  vessel atherosclerotic coronary artery disease. Few small thyroid nodules with the largest measuring 1.2 cm over the left lobe. 2.4 cm left renal cyst. Sub cm right renal hypodensity unchanged and too small to characterize but likely a cyst. Minimal cholelithiasis.      01/31/2016 Imaging    ECHO showed: Left ventricle: The cavity size was normal. Systolic function was normal. The estimated ejection fraction was in the range of 60% to 65%. Wall motion was normal; there were no regional wall motion abnormalities. There was an increased relative contribution of atrial contraction to ventricular filling. Doppler parameters are consistent with abnormal left ventricular relaxation (grade 1 diastolic dysfunction).      02/07/2016 Procedure    He had imaging guided biopsy of supraclavicular lymph node      02/07/2016 Pathology Results    Accession: [id number] Core biopsies reveal diffuse areas of large atypical lymphoid cells. There are multiple cells with very large irregular nuclei or multinucleated cells. Immunohistochemistry reveals the cells are positive for CD20, CD45, bcl-2, and bcl-6. They are negative for CD10, CD30, and ALK. CD3, CD4, CD5 and CD8 reveal scattered T-cells. Ki-67 reveals an elevated proliferation rate (95%). The cells are negative for cytokeratin 7, cytokeratin 20, TTF-1, CDX-2, HMB45, SOX11, and MelanA. Overall, these findings are consistent with a diffuse large B-cell lymphoma      02/12/2016 - 02/21/2016 Hospital Admission    He was admitted to the hospital with sepsis. He received cycle 1 of chemotherapy will hospitalized      02/14/2016 Imaging    Ct abdomen and pelvis showed widespread retrocrural, retroperitoneal, and bilateral pelvic lymphadenopathy, waxing/waning when compared to recent CT, as above. Mild right hydronephrosis, likely secondary to extrinsic compression by right retroperitoneal lymphadenopathy, with  associated diminished enhancement of the right kidney.  Spleen is normal in size.      02/16/2016 Bone Marrow Biopsy    Bone marrow biopsy was negative for lymphoma involvement. Cytogenetics came back abnormal at [facility]      02/16/2016 Procedure    He had port placement      02/16/2016 - 02/21/2016 Chemotherapy    He received cycle 1 of R-EPOCH        INTERVAL HISTORY: Please see below for problem oriented charting. He returns today for supportive care visit. He feels well. He had recent constipation, resolved with laxatives. His appetite is stable. Denies sore throat, mucositis, fever or chills  REVIEW OF SYSTEMS:   Constitutional: Denies fevers, chills or abnormal weight loss Eyes: Denies blurriness of vision Ears, nose, mouth, throat, and face: Denies mucositis or sore throat Respiratory: Denies cough, dyspnea or wheezes Cardiovascular: Denies palpitation, chest discomfort or lower extremity swelling Skin: Denies abnormal skin rashes Lymphatics: Denies new lymphadenopathy or easy bruising Neurological:Denies numbness, tingling or new weaknesses Behavioral/Psych: Mood is stable, no new changes  All other systems were reviewed with the patient and are negative.  I have reviewed the past medical history, past surgical history, social history and family history with the patient and they are unchanged from previous note.  ALLERGIES:  is allergic to bee venom; metformin and related; and sulfa drugs cross reactors.  MEDICATIONS:  Current Outpatient Prescriptions  Medication Sig Dispense Refill  . allopurinol (ZYLOPRIM) 300 MG tablet Take 1 tablet (300 mg total) by mouth daily. 30 tablet 0  . amLODipine (NORVASC) 10 MG tablet Take 10 mg by mouth daily.    Marland Kitchen atorvastatin (LIPITOR) 40 MG tablet Take 40 mg by mouth daily at 6 PM.    . clopidogrel (PLAVIX) 75 MG tablet Take 75 mg by mouth daily with breakfast.    . dexamethasone (DECADRON) 4 MG tablet Take 2 tablets (8 mg total) by mouth 2 (two) times daily with a meal. Take two times  a day starting the day after chemotherapy for 3 days. 30 tablet 1  . lidocaine-prilocaine (EMLA) cream Apply 1 application topically as needed. 30 g 6  . lisinopril (PRINIVIL,ZESTRIL) 5 MG tablet Take 5 mg by mouth daily.    . ondansetron (ZOFRAN) 8 MG tablet Take 1 tablet (8 mg total) by mouth every 8 (eight) hours as needed. 30 tablet 1  . ondansetron (ZOFRAN) 8 MG tablet Take 1 tablet (8 mg total) by mouth every 8 (eight) hours as needed for nausea or vomiting. 60 tablet 6  . pioglitazone (ACTOS) 30 MG tablet Take 30 mg by mouth daily.    . prochlorperazine (COMPAZINE) 10 MG tablet Take 1 tablet (10 mg total) by mouth every 6 (six) hours as needed. 60 tablet 3  . fluconazole (DIFLUCAN) 100 MG tablet Take 1 tablet (100 mg total) by mouth daily. 7 tablet 0   No current facility-administered medications for this visit.     PHYSICAL EXAMINATION: ECOG PERFORMANCE STATUS: 1 - Symptomatic but completely ambulatory  Vitals:   02/28/16 1038  BP: 111/75  Pulse: 95  Resp: 15  Temp: 97.8 F (36.6 C)   Filed Weights   02/28/16 1038  Weight: 195 lb (88.5 kg)    GENERAL:alert, no distress and comfortable SKIN: skin color, texture, turgor are normal, no rashes or significant lesions EYES: normal, Conjunctiva are pink and non-injected, sclera clear OROPHARYNX:Noted oral candidiasis. NECK: supple, thyroid normal size, non-tender, without nodularity LYMPH:  no palpable  lymphadenopathy in the cervical, axillary or inguinal LUNGS: clear to auscultation and percussion with normal breathing effort HEART: regular rate & rhythm and no murmurs and no lower extremity edema ABDOMEN:abdomen soft, non-tender and normal bowel sounds Musculoskeletal:no cyanosis of digits and no clubbing  NEURO: alert & oriented x 3 with fluent speech, no focal motor/sensory deficits  LABORATORY DATA:  I have reviewed the data as listed    Component Value Date/Time   NA 131 (L) 02/28/2016 1005   K 4.2 02/28/2016 1005    CL 101 02/21/2016 0359   CO2 26 02/28/2016 1005   GLUCOSE 135 02/28/2016 1005   BUN 17.5 02/28/2016 1005   CREATININE 0.7 02/28/2016 1005   CALCIUM 9.1 02/28/2016 1005   PROT 6.3 (L) 02/28/2016 1005   ALBUMIN 3.0 (L) 02/28/2016 1005   AST 11 02/28/2016 1005   ALT 19 02/28/2016 1005   ALKPHOS 65 02/28/2016 1005   BILITOT 0.63 02/28/2016 1005   GFRNONAA >60 02/21/2016 0359   GFRAA >60 02/21/2016 0359    No results found for: SPEP, UPEP  Lab Results  Component Value Date   WBC 0.7 (LL) 02/28/2016   NEUTROABS 0.3 (LL) 02/28/2016   HGB 11.8 (L) 02/28/2016   HCT 34.2 (L) 02/28/2016   MCV 83.6 02/28/2016   PLT 84 (L) 02/28/2016      Chemistry      Component Value Date/Time   NA 131 (L) 02/28/2016 1005   K 4.2 02/28/2016 1005   CL 101 02/21/2016 0359   CO2 26 02/28/2016 1005   BUN 17.5 02/28/2016 1005   CREATININE 0.7 02/28/2016 1005      Component Value Date/Time   CALCIUM 9.1 02/28/2016 1005   ALKPHOS 65 02/28/2016 1005   AST 11 02/28/2016 1005   ALT 19 02/28/2016 1005   BILITOT 0.63 02/28/2016 1005       ASSESSMENT & PLAN:  Diffuse large B-cell lymphoma of lymph nodes of multiple regions [facility]) He is currently neutropenic. Examination revealed oral candidiasis. I will proceed to treat that. We discussed neutropenic precaution. I will see him back again next week and if it blood count recovers, he will be readmitted to the hospital around 03/08/2016 for cycle 2 of treatment  Pancytopenia, acquired [facility] This is related to recent treatment. He does not require blood transfusion. He will get 1 unit of blood if hemoglobin is less than 8 and 1 unit of platelets if he has bleeding or platelet count less than 10,000. He needs irradiated blood products. We discussed neutropenic precautions  Candidiasis of mouth He has oral candidiasis. I will proceed to treat him with fluconazole   No orders of the defined types were placed in this encounter.  All questions  were answered. The patient knows to call the clinic with any problems, questions or concerns. No barriers to learning was detected. I spent 15 minutes counseling the patient face to face. The total time spent in the appointment was 20 minutes and more than 50% was on counseling and review of test results     Heath Lark, MD 02/28/2016 11:00 AM

## 2016-02-28 NOTE — Addendum Note (Signed)
Addended by: Ricarda Frame C on: 02/28/2016 11:10 AM   Modules accepted: Orders, SmartSet

## 2016-02-29 ENCOUNTER — Telehealth: Payer: Self-pay | Admitting: *Deleted

## 2016-02-29 ENCOUNTER — Other Ambulatory Visit: Payer: Medicare Other

## 2016-02-29 LAB — TISSUE HYBRIDIZATION TO NCBH

## 2016-02-29 MED ORDER — LACTULOSE 10 GM/15ML PO SOLN: 10.0000 g | Freq: Three times a day (TID) | ORAL | 0 refills | Status: DC

## 2016-02-29 NOTE — Telephone Encounter (Signed)
Pt says he is still constipated.  Had a few small BMs yesterday but now feels very hard stool that he cannot push out.  He took Miralax yesterday morning and 2 Senokot-S twice.  He repeated Miralax and 2 Senokot-S this morning w/o any relief.   He says he is "miserable" and asks if ok to use enema?   Informed pt we usually discourage use of enemas or suppositories if someone is Neutropenic but I will ask Dr. Alvy Bimler what else she advises and If she thinks it is ok to use enema at this point or a stronger laxative? Also discussed Dr. Alvy Bimler might order nurses to give Miralax daily while he is in hospital next week for chemo to prevent constipation w/ his next treatment.  Pt agreed.

## 2016-02-29 NOTE — Telephone Encounter (Signed)
Instructed pt's wife on Dr. Calton Dach instructions.  New Rx Lactulose sent to [facility].  Wife verbalized understanding.

## 2016-02-29 NOTE — Telephone Encounter (Signed)
No enema Call in lactulose 15 ml TID PO, increase sennokot to 2 tabs TID along with daily Miralax

## 2016-03-02 ENCOUNTER — Other Ambulatory Visit (HOSPITAL_BASED_OUTPATIENT_CLINIC_OR_DEPARTMENT_OTHER): Payer: Medicare Other

## 2016-03-02 ENCOUNTER — Ambulatory Visit (HOSPITAL_COMMUNITY)
Admission: RE | Admit: 2016-03-02 | Discharge: 2016-03-02 | Disposition: A | Discharge status: Home / Self Care | Payer: Medicare Other | Source: Ambulatory Visit | Attending: Hematology and Oncology | Admitting: Hematology and Oncology

## 2016-03-02 ENCOUNTER — Ambulatory Visit (HOSPITAL_BASED_OUTPATIENT_CLINIC_OR_DEPARTMENT_OTHER): Payer: Medicare Other

## 2016-03-02 ENCOUNTER — Ambulatory Visit: Payer: Medicare Other

## 2016-03-02 DIAGNOSIS — C8339 Diffuse large B-cell lymphoma, extranodal and solid organ sites: Secondary | ICD-10-CM

## 2016-03-02 DIAGNOSIS — C8510 Unspecified B-cell lymphoma, unspecified site: Secondary | ICD-10-CM

## 2016-03-02 DIAGNOSIS — Z95828 Presence of other vascular implants and grafts: Secondary | ICD-10-CM

## 2016-03-02 LAB — CBC WITH DIFFERENTIAL/PLATELET
BASO%: 0.6 % (ref 0.0–2.0)
Basophils Absolute: 0 10*3/uL (ref 0.0–0.1)
EOS%: 0.2 % (ref 0.0–7.0)
Eosinophils Absolute: 0 10*3/uL (ref 0.0–0.5)
HCT: 34.5 % — ABNORMAL LOW (ref 38.4–49.9)
HEMOGLOBIN: 11.4 g/dL — AB (ref 13.0–17.1)
LYMPH%: 16.3 % (ref 14.0–49.0)
MCH: 28.3 pg (ref 27.2–33.4)
MCHC: 33 g/dL (ref 32.0–36.0)
MCV: 85.9 fL (ref 79.3–98.0)
MONO#: 0.8 10*3/uL (ref 0.1–0.9)
MONO%: 15 % — AB (ref 0.0–14.0)
NEUT%: 67.9 % (ref 39.0–75.0)
NEUTROS ABS: 3.6 10*3/uL (ref 1.5–6.5)
Platelets: 152 10*3/uL (ref 140–400)
RBC: 4.02 10*6/uL — ABNORMAL LOW (ref 4.20–5.82)
RDW: 14.7 % — AB (ref 11.0–14.6)
WBC: 5.4 10*3/uL (ref 4.0–10.3)
lymph#: 0.9 10*3/uL (ref 0.9–3.3)

## 2016-03-02 LAB — COMPREHENSIVE METABOLIC PANEL
ALBUMIN: 2.8 g/dL — AB (ref 3.5–5.0)
ALK PHOS: 63 U/L (ref 40–150)
ALT: 19 U/L (ref 0–55)
AST: 16 U/L (ref 5–34)
Anion Gap: 10 mEq/L (ref 3–11)
BILIRUBIN TOTAL: 0.3 mg/dL (ref 0.20–1.20)
BUN: 13.7 mg/dL (ref 7.0–26.0)
CO2: 26 mEq/L (ref 22–29)
Calcium: 9.1 mg/dL (ref 8.4–10.4)
Chloride: 97 mEq/L — ABNORMAL LOW (ref 98–109)
Creatinine: 0.9 mg/dL (ref 0.7–1.3)
EGFR: 87 mL/min/{1.73_m2} — ABNORMAL LOW (ref >90)
GLUCOSE: 110 mg/dL (ref 70–140)
Potassium: 4.5 mEq/L (ref 3.5–5.1)
SODIUM: 132 meq/L — AB (ref 136–145)
TOTAL PROTEIN: 6.1 g/dL — AB (ref 6.4–8.3)

## 2016-03-02 MED ORDER — HEPARIN SOD (PORK) LOCK FLUSH 100 UNIT/ML IV SOLN
500.0000 [IU] | Freq: Once | INTRAVENOUS | Status: AC
  Administered 2016-03-02: 500 [IU] via INTRAVENOUS
  Filled 2016-03-02: qty 5

## 2016-03-02 MED ORDER — SODIUM CHLORIDE 0.9% FLUSH
10.0000 mL | INTRAVENOUS | Status: DC | PRN
  Administered 2016-03-02: 10 mL via INTRAVENOUS
  Filled 2016-03-02: qty 10

## 2016-03-02 NOTE — Addendum Note (Signed)
Addended by: Ricarda Frame C on: 03/02/2016 12:13 PM   Modules accepted: Orders, SmartSet

## 2016-03-02 NOTE — Progress Notes (Unsigned)
Informed pt of lab results and gave him copy of CBC.  No need for transfusion today.  Return next week on 12/5 as scheduled.  Pt verbalized understanding.  PAC flushed and de-accessed.

## 2016-03-06 ENCOUNTER — Encounter: Payer: Self-pay | Admitting: Hematology and Oncology

## 2016-03-06 ENCOUNTER — Encounter (HOSPITAL_COMMUNITY): Payer: Self-pay

## 2016-03-06 ENCOUNTER — Other Ambulatory Visit (HOSPITAL_BASED_OUTPATIENT_CLINIC_OR_DEPARTMENT_OTHER): Payer: Medicare Other

## 2016-03-06 ENCOUNTER — Ambulatory Visit (HOSPITAL_BASED_OUTPATIENT_CLINIC_OR_DEPARTMENT_OTHER): Payer: Medicare Other | Admitting: Hematology and Oncology

## 2016-03-06 VITALS — BP 116/79 | HR 94 | Temp 98.0°F | Resp 16 | Ht 71.0 in | Wt 188.7 lb

## 2016-03-06 DIAGNOSIS — E440 Moderate protein-calorie malnutrition: Secondary | ICD-10-CM

## 2016-03-06 DIAGNOSIS — C8338 Diffuse large B-cell lymphoma, lymph nodes of multiple sites: Secondary | ICD-10-CM

## 2016-03-06 DIAGNOSIS — I10 Essential (primary) hypertension: Secondary | ICD-10-CM | POA: Diagnosis not present

## 2016-03-06 DIAGNOSIS — B370 Candidal stomatitis: Secondary | ICD-10-CM

## 2016-03-06 DIAGNOSIS — C8510 Unspecified B-cell lymphoma, unspecified site: Secondary | ICD-10-CM

## 2016-03-06 LAB — COMPREHENSIVE METABOLIC PANEL
ALBUMIN: 3.1 g/dL — AB (ref 3.5–5.0)
ALK PHOS: 71 U/L (ref 40–150)
ALT: 18 U/L (ref 0–55)
ANION GAP: 11 meq/L (ref 3–11)
AST: 15 U/L (ref 5–34)
BILIRUBIN TOTAL: 0.24 mg/dL (ref 0.20–1.20)
BUN: 11.7 mg/dL (ref 7.0–26.0)
CALCIUM: 9.6 mg/dL (ref 8.4–10.4)
CO2: 26 mEq/L (ref 22–29)
Chloride: 97 mEq/L — ABNORMAL LOW (ref 98–109)
Creatinine: 0.8 mg/dL (ref 0.7–1.3)
EGFR: 88 mL/min/{1.73_m2} — AB (ref >90)
GLUCOSE: 156 mg/dL — AB (ref 70–140)
Potassium: 4.7 mEq/L (ref 3.5–5.1)
Sodium: 134 mEq/L — ABNORMAL LOW (ref 136–145)
TOTAL PROTEIN: 6.6 g/dL (ref 6.4–8.3)

## 2016-03-06 LAB — CBC WITH DIFFERENTIAL/PLATELET
BASO%: 0.8 % (ref 0.0–2.0)
BASOS ABS: 0.1 10*3/uL (ref 0.0–0.1)
EOS ABS: 0 10*3/uL (ref 0.0–0.5)
EOS%: 0.1 % (ref 0.0–7.0)
HEMATOCRIT: 37.6 % — AB (ref 38.4–49.9)
HEMOGLOBIN: 12.1 g/dL — AB (ref 13.0–17.1)
LYMPH%: 9.8 % — ABNORMAL LOW (ref 14.0–49.0)
MCH: 28 pg (ref 27.2–33.4)
MCHC: 32.2 g/dL (ref 32.0–36.0)
MCV: 87 fL (ref 79.3–98.0)
MONO#: 1 10*3/uL — AB (ref 0.1–0.9)
MONO%: 8.7 % (ref 0.0–14.0)
NEUT%: 80.6 % — ABNORMAL HIGH (ref 39.0–75.0)
NEUTROS ABS: 9.5 10*3/uL — AB (ref 1.5–6.5)
PLATELETS: 252 10*3/uL (ref 140–400)
RBC: 4.32 10*6/uL (ref 4.20–5.82)
RDW: 14.7 % — AB (ref 11.0–14.6)
WBC: 11.8 10*3/uL — AB (ref 4.0–10.3)
lymph#: 1.2 10*3/uL (ref 0.9–3.3)

## 2016-03-06 NOTE — Assessment & Plan Note (Signed)
His blood pressure is running low. I recommend he discontinue lisinopril.

## 2016-03-06 NOTE — Progress Notes (Deleted)
Per Dr. Alvy Bimler patient does not meet requirements for blood products 03/06/2016

## 2016-03-06 NOTE — Assessment & Plan Note (Signed)
He has lost some weight. However, his appetite is preserved. Recommend he continue nutritional intake as tolerated

## 2016-03-06 NOTE — Assessment & Plan Note (Signed)
I reviewed recent FISH panel on tissue sample which demonstrated extra copies of BCL 6, BCL 2 and MYC. The patient appears to respond well to treatment clinically. He will be admitted to the hospital for cycle 2 of treatment. On 03/08/2016, I would give him rituximab and he will begin systemic chemotherapy on December 8th. He will return on December 14 to the cancer center for Neulasta injection. The following week, he will get blood drawn twice a week for blood count monitoring I plan to repeat PET CT scan after Christmas holidays to assess response to treatment. The patient may benefit from bone marrow transplant evaluation in the future if he demonstrated excellent response to treatment

## 2016-03-06 NOTE — Progress Notes (Signed)
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 Stable ectasia or post stenotic dilatation of the right external iliac artery measuring up to 1.5 cm. Extensive atherosclerotic disease involving the abdominal aorta with a chronic intimal flap or short segment dissection in the infrarenal abdominal aorta. Chronic occlusion of the left common iliac artery. Slowly enlarging retroperitoneal lymph nodes as described. An indolent neoplastic or lymphoproliferative process cannot be excluded      01/06/2016 Imaging    Ct abdomen and pelvis showed new extensive centrally necrotic retroperitoneal and bilateral pelvic lymphadenopathy. Differential includes lymphoproliferative condition (lymphoma/leukemia) or infectious lymphadenitis (such as due to bacterial, viral or mycobacterial etiologies). Squamous cell carcinoma metastases can have a similar appearance, although this is less likely given the location. Clinical and laboratory evaluation for a lymphoproliferative condition and tissue sampling is advised. 2. Aortic atherosclerosis. Stable 3.1 cm infrarenal abdominal aortic aneurysm      01/27/2016 Imaging    CH chest showed no acute cardiopulmonary disease. Known stable retrocrural and periaortic adenopathy. Oval density over the left neck base measuring 2.5 x 4.5 cm likely due partially to prominent internal jugular vein, although cannot exclude adenopathy in this region. Recommend neck CT with contrast. Three  vessel atherosclerotic coronary artery disease. Few small thyroid nodules with the largest measuring 1.2 cm over the left lobe. 2.4 cm left renal cyst. Sub cm right renal hypodensity unchanged and too small to characterize but likely a cyst. Minimal cholelithiasis.      01/31/2016 Imaging    ECHO showed: Left ventricle: The cavity size was normal. Systolic function was normal. The estimated ejection fraction was in the range of 60% to 65%. Wall motion was normal; there were no regional wall motion abnormalities. There was an increased relative contribution of atrial contraction to ventricular filling. Doppler parameters are consistent with abnormal left ventricular relaxation (grade 1 diastolic dysfunction).      02/07/2016 Procedure    He had imaging guided biopsy of supraclavicular lymph node      02/07/2016 Pathology Results    Accession: [id number] Core biopsies reveal diffuse areas of large atypical lymphoid cells. There are multiple cells with very large irregular nuclei or multinucleated cells. Immunohistochemistry reveals the cells are positive for CD20, CD45, bcl-2, and bcl-6. They are negative for CD10, CD30, and ALK. CD3, CD4, CD5 and CD8 reveal scattered T-cells. Ki-67 reveals an elevated proliferation rate (95%). The cells are negative for cytokeratin 7, cytokeratin 20, TTF-1, CDX-2, HMB45, SOX11, and MelanA. Overall, these findings are consistent with a diffuse large B-cell lymphoma      02/12/2016 - 02/21/2016 Hospital Admission    He was admitted to the hospital with sepsis. He received cycle 1 of chemotherapy will hospitalized      02/14/2016 Imaging    Ct abdomen and pelvis showed widespread retrocrural, retroperitoneal, and bilateral pelvic lymphadenopathy, waxing/waning when compared to recent CT, as above. Mild right hydronephrosis, likely secondary to extrinsic compression by right retroperitoneal lymphadenopathy, with  associated diminished enhancement of the right kidney.  Spleen is normal in size.      02/16/2016 Bone Marrow Biopsy    Bone marrow biopsy was negative for lymphoma involvement. Cytogenetics came back abnormal: -Y in 45% of cell lines      02/16/2016 Procedure    He had port placement      02/16/2016 - 02/21/2016 Chemotherapy    He received cycle 1 of R-EPOCH        INTERVAL HISTORY: Please see below for problem oriented charting. He returns with his wife to be seen prior to cycle 2 of treatment. He had recent constipation, resolved. Oral thrush is resolved. His appetite is stable  REVIEW OF SYSTEMS:   Constitutional: Denies fevers, chills or abnormal weight loss Eyes: Denies blurriness of vision Ears, nose, mouth, throat, and face: Denies mucositis or sore throat Respiratory: Denies cough, dyspnea or wheezes Cardiovascular: Denies palpitation, chest discomfort or lower extremity swelling Gastrointestinal:  Denies nausea, heartburn or change in bowel habits Skin: Denies abnormal skin rashes Lymphatics: Denies new lymphadenopathy or easy bruising Neurological:Denies numbness, tingling or new weaknesses Behavioral/Psych: Mood is stable, no new changes  All other systems were reviewed with the patient and are negative.  I have reviewed the past medical history, past surgical history, social history and family history with the patient and they are unchanged from previous note.  ALLERGIES:  is allergic to bee venom; metformin and related; and sulfa drugs cross reactors.  MEDICATIONS:  Current Outpatient Prescriptions  Medication Sig Dispense Refill  . allopurinol (ZYLOPRIM) 300 MG tablet Take 1 tablet (300 mg total) by mouth daily. 30 tablet 0  . amLODipine (NORVASC) 10 MG tablet Take 10 mg by mouth daily.    Marland Kitchen atorvastatin (LIPITOR) 40 MG tablet Take 40 mg by mouth daily at 6 PM.    . clopidogrel (PLAVIX) 75 MG tablet Take 75 mg by mouth daily with breakfast.    . lidocaine-prilocaine (EMLA) cream Apply 1 application  topically as needed. 30 g 6  . lisinopril (PRINIVIL,ZESTRIL) 5 MG tablet Take 5 mg by mouth daily.    . pioglitazone (ACTOS) 30 MG tablet Take 30 mg by mouth daily.    Marland Kitchen dexamethasone (DECADRON) 4 MG tablet Take 2 tablets (8 mg total) by mouth 2 (two) times daily with a meal. Take two times a day starting the day after chemotherapy for 3 days. (Patient not taking: Reported on 03/06/2016) 30 tablet 1  . fluconazole (DIFLUCAN) 100 MG tablet Take 1 tablet (100 mg total) by mouth daily. (Patient not taking: Reported on 03/06/2016) 7 tablet 0  . lactulose (CHRONULAC) 10 GM/15ML solution Take 15 mLs (10 g total) by mouth 3 (three) times daily. (Patient not taking: Reported on 03/06/2016) 240 mL 0  . ondansetron (ZOFRAN) 8 MG tablet Take 1 tablet (8 mg total) by mouth every 8 (eight) hours as needed for nausea or vomiting. (Patient not taking: Reported on 03/06/2016) 60 tablet 6  . prochlorperazine (COMPAZINE) 10 MG tablet Take 1 tablet (10 mg total) by mouth every 6 (six) hours as needed. (Patient not taking: Reported on 03/06/2016) 60 tablet 3   No current facility-administered medications for this visit.     PHYSICAL EXAMINATION: ECOG PERFORMANCE STATUS: 1 - Symptomatic but completely ambulatory  Vitals:   03/06/16 1048  BP: 116/79  Pulse: 94  Resp: 16  Temp: 98 F (36.7 C)   Filed Weights   03/06/16 1048  Weight: 188 lb 11.2 oz (85.6 kg)  GENERAL:alert, no distress and comfortable SKIN: skin color, texture, turgor are normal, no rashes or significant lesions EYES: normal, Conjunctiva are pink and non-injected, sclera clear OROPHARYNX:no exudate, no erythema and lips, buccal mucosa, and tongue normal  NECK: supple, thyroid normal size, non-tender, without nodularity LYMPH:  no palpable lymphadenopathy in the cervical, axillary or inguinal LUNGS: clear to auscultation and percussion with normal breathing effort HEART: regular rate & rhythm and no murmurs and no lower extremity  edema ABDOMEN:abdomen soft, non-tender and normal bowel sounds Musculoskeletal:no cyanosis of digits and no clubbing  NEURO: alert & oriented x 3 with fluent speech, no focal motor/sensory deficits  LABORATORY DATA:  I have reviewed the data as listed    Component Value Date/Time   NA 134 (L) 03/06/2016 1025   K 4.7 03/06/2016 1025   CL 101 02/21/2016 0359   CO2 26 03/06/2016 1025   GLUCOSE 156 (H) 03/06/2016 1025   BUN 11.7 03/06/2016 1025   CREATININE 0.8 03/06/2016 1025   CALCIUM 9.6 03/06/2016 1025   PROT 6.6 03/06/2016 1025   ALBUMIN 3.1 (L) 03/06/2016 1025   AST 15 03/06/2016 1025   ALT 18 03/06/2016 1025   ALKPHOS 71 03/06/2016 1025   BILITOT 0.24 03/06/2016 1025   GFRNONAA >60 02/21/2016 0359   GFRAA >60 02/21/2016 0359    No results found for: SPEP, UPEP  Lab Results  Component Value Date   WBC 11.8 (H) 03/06/2016   NEUTROABS 9.5 (H) 03/06/2016   HGB 12.1 (L) 03/06/2016   HCT 37.6 (L) 03/06/2016   MCV 87.0 03/06/2016   PLT 252 03/06/2016      Chemistry      Component Value Date/Time   NA 134 (L) 03/06/2016 1025   K 4.7 03/06/2016 1025   CL 101 02/21/2016 0359   CO2 26 03/06/2016 1025   BUN 11.7 03/06/2016 1025   CREATININE 0.8 03/06/2016 1025      Component Value Date/Time   CALCIUM 9.6 03/06/2016 1025   ALKPHOS 71 03/06/2016 1025   AST 15 03/06/2016 1025   ALT 18 03/06/2016 1025   BILITOT 0.24 03/06/2016 1025     ASSESSMENT & PLAN:  Diffuse large B-cell lymphoma of lymph nodes of multiple regions ([place]) I reviewed recent FISH panel on tissue sample which demonstrated extra copies of BCL 6, BCL 2 and MYC. The patient appears to respond well to treatment clinically. He will be admitted to the hospital for cycle 2 of treatment. On 03/08/2016, I would give him rituximab and he will begin systemic chemotherapy on December 8th. He will return on December 14 to the cancer center for Neulasta injection. The following week, he will get blood drawn twice  a week for blood count monitoring I plan to repeat PET CT scan after Christmas holidays to assess response to treatment. The patient may benefit from bone marrow transplant evaluation in the future if he demonstrated excellent response to treatment  Essential hypertension His blood pressure is running low. I recommend he discontinue lisinopril.  Candidiasis of mouth Candidiasis has resolved.  Protein-calorie malnutrition, moderate ([place]) He has lost some weight. However, his appetite is preserved. Recommend he continue nutritional intake as tolerated   Orders Placed This Encounter  Procedures  . NM PET Image Initial (PI) Skull Base To Thigh    Standing Status:   Future    Standing Expiration Date:   04/10/2017    Order Specific Question:   Reason for exam:    Answer:   staging lymphoma,  assess response to Rx    Order Specific Question:   Preferred imaging location?    Answer:   [facility]   All questions were answered. The patient knows to call the clinic with any problems, questions or concerns. No barriers to learning was detected. I spent 23 minutes counseling the patient face to face. The total time spent in the appointment was 40 minutes and more than 50% was on counseling and review of test results     Heath Lark, MD 03/06/2016 12:51 PM

## 2016-03-06 NOTE — Assessment & Plan Note (Signed)
Candidiasis has resolved.

## 2016-03-07 ENCOUNTER — Ambulatory Visit: Payer: Medicare Other | Admitting: Family

## 2016-03-07 ENCOUNTER — Encounter (HOSPITAL_COMMUNITY): Payer: Medicare Other

## 2016-03-08 ENCOUNTER — Encounter (HOSPITAL_COMMUNITY): Payer: Self-pay

## 2016-03-08 ENCOUNTER — Inpatient Hospital Stay (HOSPITAL_COMMUNITY)
Admission: RE | Admit: 2016-03-08 | Discharge: 2016-03-12 | DRG: 847 | Disposition: A | Discharge status: Home / Self Care | Payer: Medicare Other | Source: Ambulatory Visit | Attending: Hematology and Oncology | Admitting: Hematology and Oncology

## 2016-03-08 DIAGNOSIS — K5900 Constipation, unspecified: Secondary | ICD-10-CM | POA: Diagnosis present

## 2016-03-08 DIAGNOSIS — I723 Aneurysm of iliac artery: Secondary | ICD-10-CM | POA: Diagnosis not present

## 2016-03-08 DIAGNOSIS — Z5111 Encounter for antineoplastic chemotherapy: Principal | ICD-10-CM

## 2016-03-08 DIAGNOSIS — R634 Abnormal weight loss: Secondary | ICD-10-CM | POA: Diagnosis not present

## 2016-03-08 DIAGNOSIS — K219 Gastro-esophageal reflux disease without esophagitis: Secondary | ICD-10-CM | POA: Diagnosis present

## 2016-03-08 DIAGNOSIS — I700 Atherosclerosis of aorta: Secondary | ICD-10-CM

## 2016-03-08 DIAGNOSIS — E119 Type 2 diabetes mellitus without complications: Secondary | ICD-10-CM | POA: Diagnosis present

## 2016-03-08 DIAGNOSIS — Z801 Family history of malignant neoplasm of trachea, bronchus and lung: Secondary | ICD-10-CM

## 2016-03-08 DIAGNOSIS — C8330 Diffuse large B-cell lymphoma, unspecified site: Secondary | ICD-10-CM | POA: Diagnosis present

## 2016-03-08 DIAGNOSIS — I2510 Atherosclerotic heart disease of native coronary artery without angina pectoris: Secondary | ICD-10-CM

## 2016-03-08 DIAGNOSIS — Z6825 Body mass index (BMI) 25.0-25.9, adult: Secondary | ICD-10-CM | POA: Diagnosis not present

## 2016-03-08 DIAGNOSIS — I10 Essential (primary) hypertension: Secondary | ICD-10-CM | POA: Diagnosis present

## 2016-03-08 DIAGNOSIS — D649 Anemia, unspecified: Secondary | ICD-10-CM | POA: Diagnosis present

## 2016-03-08 DIAGNOSIS — Z8673 Personal history of transient ischemic attack (TIA), and cerebral infarction without residual deficits: Secondary | ICD-10-CM | POA: Diagnosis not present

## 2016-03-08 DIAGNOSIS — Z7901 Long term (current) use of anticoagulants: Secondary | ICD-10-CM

## 2016-03-08 DIAGNOSIS — C8338 Diffuse large B-cell lymphoma, lymph nodes of multiple sites: Secondary | ICD-10-CM

## 2016-03-08 DIAGNOSIS — K5909 Other constipation: Secondary | ICD-10-CM

## 2016-03-08 DIAGNOSIS — R591 Generalized enlarged lymph nodes: Secondary | ICD-10-CM

## 2016-03-08 DIAGNOSIS — Z87891 Personal history of nicotine dependence: Secondary | ICD-10-CM

## 2016-03-08 DIAGNOSIS — Z79899 Other long term (current) drug therapy: Secondary | ICD-10-CM

## 2016-03-08 DIAGNOSIS — E440 Moderate protein-calorie malnutrition: Secondary | ICD-10-CM | POA: Diagnosis present

## 2016-03-08 DIAGNOSIS — I959 Hypotension, unspecified: Secondary | ICD-10-CM | POA: Diagnosis not present

## 2016-03-08 DIAGNOSIS — E785 Hyperlipidemia, unspecified: Secondary | ICD-10-CM | POA: Diagnosis present

## 2016-03-08 MED ORDER — METHYLPREDNISOLONE SODIUM SUCC 125 MG IJ SOLR: 125.0000 mg | Freq: Once | INTRAMUSCULAR | Status: DC | PRN

## 2016-03-08 MED ORDER — ALTEPLASE 2 MG IJ SOLR
2.0000 mg | Freq: Once | INTRAMUSCULAR | Status: DC | PRN
  Filled 2016-03-08: qty 2

## 2016-03-08 MED ORDER — RITUXIMAB CHEMO INJECTION 500 MG/50ML
375.0000 mg/m2 | Freq: Once | INTRAVENOUS | Status: AC
  Administered 2016-03-08: 800 mg via INTRAVENOUS
  Filled 2016-03-08: qty 50

## 2016-03-08 MED ORDER — SODIUM CHLORIDE 0.9 % IV SOLN: Freq: Once | INTRAVENOUS | Status: DC

## 2016-03-08 MED ORDER — ACETAMINOPHEN 325 MG PO TABS: 650.0000 mg | ORAL_TABLET | ORAL | Status: DC | PRN

## 2016-03-08 MED ORDER — SODIUM CHLORIDE 0.9% FLUSH: 3.0000 mL | INTRAVENOUS | Status: DC | PRN

## 2016-03-08 MED ORDER — SODIUM CHLORIDE 0.9% FLUSH: 10.0000 mL | INTRAVENOUS | Status: DC | PRN

## 2016-03-08 MED ORDER — ONDANSETRON 4 MG PO TBDP: 4.0000 mg | ORAL_TABLET | Freq: Three times a day (TID) | ORAL | Status: DC | PRN

## 2016-03-08 MED ORDER — SODIUM CHLORIDE 0.9 % IV SOLN: Freq: Once | INTRAVENOUS | Status: DC | PRN

## 2016-03-08 MED ORDER — EPINEPHRINE PF 1 MG/ML IJ SOLN
0.5000 mg | Freq: Once | INTRAMUSCULAR | Status: DC | PRN
  Filled 2016-03-08: qty 1

## 2016-03-08 MED ORDER — FAMOTIDINE IN NACL 20-0.9 MG/50ML-% IV SOLN
20.0000 mg | Freq: Once | INTRAVENOUS | Status: DC | PRN
  Filled 2016-03-08: qty 50

## 2016-03-08 MED ORDER — SENNOSIDES-DOCUSATE SODIUM 8.6-50 MG PO TABS
2.0000 | ORAL_TABLET | Freq: Two times a day (BID) | ORAL | Status: DC
  Administered 2016-03-08 – 2016-03-12 (×8): 2 via ORAL
  Filled 2016-03-08 (×8): qty 2

## 2016-03-08 MED ORDER — ONDANSETRON HCL 4 MG PO TABS: 4.0000 mg | ORAL_TABLET | Freq: Three times a day (TID) | ORAL | Status: DC | PRN

## 2016-03-08 MED ORDER — DIPHENHYDRAMINE HCL 50 MG/ML IJ SOLN: 25.0000 mg | Freq: Once | INTRAMUSCULAR | Status: DC | PRN

## 2016-03-08 MED ORDER — DIPHENHYDRAMINE HCL 50 MG/ML IJ SOLN: 50.0000 mg | Freq: Once | INTRAMUSCULAR | Status: DC | PRN

## 2016-03-08 MED ORDER — PROCHLORPERAZINE MALEATE 10 MG PO TABS: 10.0000 mg | ORAL_TABLET | Freq: Four times a day (QID) | ORAL | Status: DC | PRN

## 2016-03-08 MED ORDER — HOT PACK MISC ONCOLOGY
1.0000 | Freq: Once | Status: DC | PRN
  Filled 2016-03-08: qty 1

## 2016-03-08 MED ORDER — HEPARIN SOD (PORK) LOCK FLUSH 100 UNIT/ML IV SOLN: 500.0000 [IU] | Freq: Once | INTRAVENOUS | Status: DC | PRN

## 2016-03-08 MED ORDER — POLYETHYLENE GLYCOL 3350 17 G PO PACK
17.0000 g | PACK | Freq: Every day | ORAL | Status: DC
  Administered 2016-03-08 – 2016-03-12 (×5): 17 g via ORAL
  Filled 2016-03-08 (×5): qty 1

## 2016-03-08 MED ORDER — DIPHENHYDRAMINE HCL 50 MG PO CAPS
50.0000 mg | ORAL_CAPSULE | Freq: Once | ORAL | Status: AC
  Administered 2016-03-08: 50 mg via ORAL
  Filled 2016-03-08: qty 1

## 2016-03-08 MED ORDER — SODIUM CHLORIDE 0.9 % IV SOLN
Freq: Once | INTRAVENOUS | Status: AC
  Administered 2016-03-08: 8 mg via INTRAVENOUS
  Filled 2016-03-08: qty 4

## 2016-03-08 MED ORDER — SODIUM CHLORIDE 0.9% FLUSH
10.0000 mL | INTRAVENOUS | Status: DC | PRN
Start: 2016-03-08 — End: 2016-03-12

## 2016-03-08 MED ORDER — ATORVASTATIN CALCIUM 40 MG PO TABS
40.0000 mg | ORAL_TABLET | Freq: Every day | ORAL | Status: DC
  Administered 2016-03-08 – 2016-03-11 (×4): 40 mg via ORAL
  Filled 2016-03-08 (×4): qty 1

## 2016-03-08 MED ORDER — LIDOCAINE-PRILOCAINE 2.5-2.5 % EX CREA: 1.0000 "application " | TOPICAL_CREAM | CUTANEOUS | Status: DC | PRN

## 2016-03-08 MED ORDER — CLOPIDOGREL BISULFATE 75 MG PO TABS
75.0000 mg | ORAL_TABLET | Freq: Every day | ORAL | Status: DC
  Administered 2016-03-09 – 2016-03-12 (×4): 75 mg via ORAL
  Filled 2016-03-08 (×4): qty 1

## 2016-03-08 MED ORDER — EPINEPHRINE PF 1 MG/10ML IJ SOSY
0.2500 mg | PREFILLED_SYRINGE | Freq: Once | INTRAMUSCULAR | Status: DC | PRN
  Filled 2016-03-08: qty 10

## 2016-03-08 MED ORDER — SODIUM CHLORIDE 0.9 % IV SOLN
INTRAVENOUS | Status: DC
  Administered 2016-03-08: 10:00:00 via INTRAVENOUS
  Administered 2016-03-09: 1000 mL via INTRAVENOUS
  Administered 2016-03-10 – 2016-03-11 (×3): via INTRAVENOUS

## 2016-03-08 MED ORDER — ACETAMINOPHEN 325 MG PO TABS
650.0000 mg | ORAL_TABLET | Freq: Once | ORAL | Status: AC
  Administered 2016-03-08: 650 mg via ORAL
  Filled 2016-03-08: qty 2

## 2016-03-08 MED ORDER — ALBUTEROL SULFATE (2.5 MG/3ML) 0.083% IN NEBU
2.5000 mg | INHALATION_SOLUTION | Freq: Once | RESPIRATORY_TRACT | Status: DC | PRN
Start: 2016-03-08 — End: 2016-03-12

## 2016-03-08 MED ORDER — PIOGLITAZONE HCL 30 MG PO TABS
30.0000 mg | ORAL_TABLET | Freq: Every day | ORAL | Status: DC
  Administered 2016-03-09 – 2016-03-12 (×4): 30 mg via ORAL
  Filled 2016-03-08 (×5): qty 1

## 2016-03-08 MED ORDER — VINCRISTINE SULFATE CHEMO INJECTION 1 MG/ML
Freq: Once | INTRAVENOUS | Status: AC
  Administered 2016-03-08: 16:00:00 via INTRAVENOUS
  Filled 2016-03-08 (×3): qty 11

## 2016-03-08 MED ORDER — ENOXAPARIN SODIUM 40 MG/0.4ML ~~LOC~~ SOLN
40.0000 mg | SUBCUTANEOUS | Status: DC
  Administered 2016-03-08 – 2016-03-12 (×5): 40 mg via SUBCUTANEOUS
  Filled 2016-03-08 (×5): qty 0.4

## 2016-03-08 MED ORDER — ONDANSETRON HCL 40 MG/20ML IJ SOLN
8.0000 mg | Freq: Three times a day (TID) | INTRAMUSCULAR | Status: DC | PRN
  Filled 2016-03-08: qty 4

## 2016-03-08 MED ORDER — HEPARIN SOD (PORK) LOCK FLUSH 100 UNIT/ML IV SOLN: 250.0000 [IU] | Freq: Once | INTRAVENOUS | Status: DC | PRN

## 2016-03-08 MED ORDER — SODIUM CHLORIDE 0.9 % IV SOLN: INTRAVENOUS | Status: DC

## 2016-03-08 MED ORDER — COLD PACK MISC ONCOLOGY
1.0000 | Freq: Once | Status: DC | PRN
  Filled 2016-03-08: qty 1

## 2016-03-08 MED ORDER — AMLODIPINE BESYLATE 10 MG PO TABS
10.0000 mg | ORAL_TABLET | Freq: Every day | ORAL | Status: DC
  Administered 2016-03-09 – 2016-03-12 (×4): 10 mg via ORAL
  Filled 2016-03-08 (×4): qty 1

## 2016-03-08 MED ORDER — ALLOPURINOL 300 MG PO TABS
300.0000 mg | ORAL_TABLET | Freq: Every day | ORAL | Status: DC
  Administered 2016-03-09 – 2016-03-12 (×4): 300 mg via ORAL
  Filled 2016-03-08 (×4): qty 1

## 2016-03-08 MED ORDER — ONDANSETRON HCL 4 MG/2ML IJ SOLN: 4.0000 mg | Freq: Three times a day (TID) | INTRAMUSCULAR | Status: DC | PRN

## 2016-03-08 NOTE — Progress Notes (Signed)
Dosage and calculations checked and verified for Doxorubicin, Etoposide and Vincristine with Reyne Dumas RN.

## 2016-03-08 NOTE — H&P (Signed)
[facility] ADMISSION NOTE  Patient Care Team: Wenda Low, MD as PCP - General (Internal Medicine)  CHIEF COMPLAINTS/PURPOSE OF ADMISSION Diffuse large B cell lymphoma  HISTORY OF PRESENTING ILLNESS:  Samuel Berger 70 y.o. 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 Stable ectasia or post stenotic dilatation of the right external iliac artery measuring up to 1.5 cm. Extensive atherosclerotic disease involving the abdominal aorta with a chronic intimal flap or short segment dissection in the infrarenal abdominal aorta. Chronic occlusion of the left common iliac artery. Slowly enlarging retroperitoneal lymph nodes as described. An indolent neoplastic or lymphoproliferative process cannot be excluded      01/06/2016 Imaging    Ct abdomen and pelvis showed new extensive centrally necrotic retroperitoneal and bilateral pelvic lymphadenopathy. Differential includes lymphoproliferative condition (lymphoma/leukemia) or infectious lymphadenitis (such as due to bacterial, viral or mycobacterial etiologies). Squamous cell carcinoma metastases can have a similar appearance, although this is less likely given the location. Clinical and laboratory evaluation for a lymphoproliferative condition and tissue sampling is advised. 2. Aortic atherosclerosis. Stable 3.1 cm infrarenal abdominal aortic aneurysm      01/27/2016 Imaging    CH chest showed no acute cardiopulmonary disease. Known stable retrocrural and periaortic adenopathy. Oval density over the  left neck base measuring 2.5 x 4.5 cm likely due partially to prominent internal jugular vein, although cannot exclude adenopathy in this region. Recommend neck CT with contrast. Three vessel atherosclerotic coronary artery disease. Few small thyroid nodules with the largest measuring 1.2 cm over the left lobe. 2.4 cm left renal cyst. Sub cm right renal hypodensity unchanged and too small to characterize but likely a cyst. Minimal cholelithiasis.      01/31/2016 Imaging    ECHO showed: Left ventricle: The cavity size was normal. Systolic function was normal. The estimated ejection fraction was in the range of 60% to 65%. Wall motion was normal; there were no regional wall motion abnormalities. There was an increased relative contribution of atrial contraction to ventricular filling. Doppler parameters are consistent with abnormal left ventricular relaxation (grade 1 diastolic dysfunction).      02/07/2016 Procedure    He had imaging guided biopsy of supraclavicular lymph node      02/07/2016 Pathology Results    Accession: [id number] Core biopsies reveal diffuse areas of large atypical lymphoid cells. There are multiple cells with very large irregular nuclei or multinucleated cells. Immunohistochemistry reveals the cells are positive for CD20, CD45, bcl-2, and bcl-6. They are negative for CD10, CD30, and ALK. CD3, CD4, CD5 and CD8 reveal scattered T-cells. Ki-67 reveals an elevated proliferation rate (95%). The cells are negative for cytokeratin 7, cytokeratin 20, TTF-1, CDX-2, HMB45, SOX11, and MelanA. Overall, these findings are consistent with a diffuse large B-cell lymphoma      02/12/2016 - 02/21/2016 Hospital Admission    He was admitted to the hospital with sepsis. He received cycle 1 of chemotherapy will hospitalized      02/14/2016 Imaging    Ct abdomen and pelvis  showed widespread retrocrural, retroperitoneal, and bilateral pelvic lymphadenopathy, waxing/waning when compared to recent CT,  as above. Mild right hydronephrosis, likely secondary to extrinsic compression by right retroperitoneal lymphadenopathy, with associated diminished enhancement of the right kidney. Spleen is normal in size.      02/16/2016 Bone Marrow Biopsy    Bone marrow biopsy was negative for lymphoma involvement. Cytogenetics came back abnormal: -Y in 45% of cell lines      02/16/2016 Procedure    He had port placement      02/16/2016 - 02/21/2016 Chemotherapy    He received cycle 1 of R-EPOCH       Since he was last seen, he feels well. No new lymphadenopathy. Constipation has resolved.  MEDICAL HISTORY:  Past Medical History:  Diagnosis Date  . 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 Carotid artery occlusion   . Dyslipidemia   . GERD (gastroesophageal reflux disease)   . Hepatitis    ? age 58 or 10  . Hyperlipidemia   . Hypertension   . Iliac artery aneurysm, right ([place])   . Stroke ([place])    01-05-2012   [id number]  . Type II or unspecified type diabetes mellitus without mention of complication, not stated as uncontrolled     SURGICAL HISTORY: Past Surgical History:  Procedure Laterality Date  . basket procedure    . CAROTID ENDARTERECTOMY    . ENDARTERECTOMY  02/08/2012   Procedure: ENDARTERECTOMY CAROTID;  Surgeon: Mal Misty, MD;  Location: [facility];  Service: Vascular;  Laterality: Right;  . IR GENERIC HISTORICAL  02/07/2016   IR US GUIDE BX ASP/DRAIN 02/07/2016 Greggory Keen, MD [name]  . IR GENERIC HISTORICAL  02/16/2016   IR FLUORO GUIDE PORT INSERTION RIGHT 02/16/2016 Aletta Edouard, MD [name]  . IR GENERIC HISTORICAL  02/16/2016   IR US GUIDE VASC ACCESS RIGHT 02/16/2016 Aletta Edouard, MD [name]  . KIDNEY STONE SURGERY     removal  . PATCH ANGIOPLASTY  02/08/2012   Procedure: PATCH ANGIOPLASTY;  Surgeon: Mal Misty, MD;  Location: [facility];  Service: Vascular;  Laterality: Right;  with dacron patch angioplasty  . TEE WITHOUT CARDIOVERSION  01/08/2012    Procedure: TRANSESOPHAGEAL ECHOCARDIOGRAM (TEE);  Surgeon: Lelon Perla, MD;  Location: [facility] ENDOSCOPY;  Service: Cardiovascular;  Laterality: N/A;  . TONSILLECTOMY      SOCIAL HISTORY: Social History   Social History  . Marital status: Married    Spouse name: anne  . Number of children: 2  . Years of education: college   Occupational History  . retired    Social History Main Topics  . Smoking status: Former Smoker    Packs/day: 0.50    Years: 30.00    Types: Cigars    Quit date: 01/05/2012  . Smokeless tobacco: Former Systems developer  . Alcohol use 1.8 oz/week    1 Glasses of wine, 1 Cans of beer, 1 Shots of liquor per week     Comment: quit 2 months..Patient drinks coffee daily.OCCASIONAL DRINKER  . Drug use: No  . Sexual activity: Not on file   Other Topics Concern  . Not on file   Social History Narrative  . No narrative on file    FAMILY HISTORY: Family History  Problem Relation Age of Onset  . Diabetes Mother   . Lung cancer Father     ALLERGIES:  is allergic to bee venom; metformin and related; and sulfa drugs cross reactors.  MEDICATIONS:  Current Facility-Administered Medications  Medication Dose Route Frequency Provider Last Rate Last Dose  . 0.9 %  sodium chloride infusion   Intravenous Continuous Alexie Lanni, MD      . 0.9 %  sodium chloride infusion   Intravenous Once Heath Lark, MD      . 0.9 %  sodium chloride infusion   Intravenous Once PRN Heath Lark, MD      . acetaminophen (TYLENOL) tablet 650 mg  650 mg Oral Q4H PRN Heath Lark, MD      . acetaminophen (TYLENOL) tablet 650 mg  650 mg Oral Once Heath Lark, MD      . albuterol (PROVENTIL) (2.5 MG/3ML) 0.083% nebulizer solution 2.5 mg  2.5 mg Nebulization Once PRN Heath Lark, MD      . allopurinol (ZYLOPRIM) tablet 300 mg  300 mg Oral Daily Dalphine Cowie, MD      . alteplase (CATHFLO ACTIVASE) injection 2 mg  2 mg Intracatheter Once PRN Heath Lark, MD      . amLODipine (NORVASC) tablet 10 mg  10 mg Oral Daily  Parke Jandreau, MD      . atorvastatin (LIPITOR) tablet 40 mg  40 mg Oral q1800 Daron Stutz, MD      . clopidogrel (PLAVIX) tablet 75 mg  75 mg Oral Q breakfast Heath Lark, MD      . diphenhydrAMINE (BENADRYL) capsule 50 mg  50 mg Oral Once Heath Lark, MD      . diphenhydrAMINE (BENADRYL) injection 25 mg  25 mg Intravenous Once PRN Heath Lark, MD      . diphenhydrAMINE (BENADRYL) injection 50 mg  50 mg Intravenous Once PRN Heath Lark, MD      . enoxaparin (LOVENOX) injection 40 mg  40 mg Subcutaneous Q24H Karne Ozga, MD      . EPINEPHrine (ADRENALIN) 0.5 mg  0.5 mg Subcutaneous Once PRN Heath Lark, MD      . EPINEPHrine (ADRENALIN) 0.5 mg  0.5 mg Subcutaneous Once PRN Heath Lark, MD      . EPINEPHrine (ADRENALIN) 1 MG/10ML injection 0.25 mg  0.25 mg Intravenous Once PRN Heath Lark, MD      . EPINEPHrine (ADRENALIN) 1 MG/10ML injection 0.25 mg  0.25 mg Intravenous Once PRN Heath Lark, MD      . famotidine (PEPCID) IVPB 20 mg premix  20 mg Intravenous Once PRN Heath Lark, MD      . heparin lock flush 100 unit/mL  500 Units Intracatheter Once PRN Heath Lark, MD      . heparin lock flush 100 unit/mL  250 Units Intracatheter Once PRN Heath Lark, MD      . lidocaine-prilocaine (EMLA) cream 1 application  1 application Topical PRN Heath Lark, MD      . methylPREDNISolone sodium succinate (SOLU-MEDROL) 125 mg/2 mL injection 125 mg  125 mg Intravenous Once PRN Heath Lark, MD      . ondansetron (ZOFRAN) tablet 4-8 mg  4-8 mg Oral Q8H PRN Heath Lark, MD       Or  . ondansetron (ZOFRAN-ODT) disintegrating tablet 4-8 mg  4-8 mg Oral Q8H PRN Heath Lark, MD       Or  . ondansetron (ZOFRAN) injection 4 mg  4 mg Intravenous Q8H PRN Yasmin Dibello, MD       Or  . ondansetron (ZOFRAN) 8 mg in sodium chloride 0.9 % 50 mL IVPB  8 mg Intravenous Q8H PRN Thayer Embleton, MD      . pioglitazone (ACTOS) tablet 30 mg  30  mg Oral Daily Heath Lark, MD      . polyethylene glycol (MIRALAX / GLYCOLAX) packet 17 g  17 g Oral Daily Heath Lark, MD      . prochlorperazine (COMPAZINE) tablet 10 mg  10 mg Oral Q6H PRN Heath Lark, MD      . riTUXimab (RITUXAN) 800 mg in sodium chloride 0.9 % 250 mL (2.4242 mg/mL) chemo infusion  375 mg/m2 (Treatment Plan Recorded) Intravenous Once Heath Lark, MD      . senna-docusate (Senokot-S) tablet 2 tablet  2 tablet Oral BID Heath Lark, MD      . sodium chloride flush (NS) 0.9 % injection 10 mL  10 mL Intracatheter PRN Heath Lark, MD      . sodium chloride flush (NS) 0.9 % injection 3 mL  3 mL Intravenous PRN Heath Lark, MD        REVIEW OF SYSTEMS:   Constitutional: Denies fevers, chills or abnormal night sweats Eyes: Denies blurriness of vision, double vision or watery eyes Ears, nose, mouth, throat, and face: Denies mucositis or sore throat Respiratory: Denies cough, dyspnea or wheezes Cardiovascular: Denies palpitation, chest discomfort or lower extremity swelling Gastrointestinal:  Denies nausea, heartburn or change in bowel habits Skin: Denies abnormal skin rashes Lymphatics: Denies new lymphadenopathy or easy bruising Neurological:Denies numbness, tingling or new weaknesses Behavioral/Psych: Mood is stable, no new changes  All other systems were reviewed with the patient and are negative.  PHYSICAL EXAMINATION: ECOG PERFORMANCE STATUS: 0 - Asymptomatic GENERAL:alert, no distress and comfortable SKIN: skin color, texture, turgor are normal, no rashes or significant lesions EYES: normal, conjunctiva are pink and non-injected, sclera clear OROPHARYNX:no exudate, no erythema and lips, buccal mucosa, and tongue normal  NECK: supple, thyroid normal size, non-tender, without nodularity LYMPH:  no palpable lymphadenopathy in the cervical, axillary or inguinal LUNGS: clear to auscultation and percussion with normal breathing effort HEART: regular rate & rhythm and no murmurs and no lower extremity edema ABDOMEN:abdomen soft, non-tender and normal bowel sounds Musculoskeletal:no  cyanosis of digits and no clubbing  PSYCH: alert & oriented x 3 with fluent speech NEURO: no focal motor/sensory deficits  LABORATORY DATA:  I have reviewed the data as listed Lab Results  Component Value Date   WBC 11.8 (H) 03/06/2016   HGB 12.1 (L) 03/06/2016   HCT 37.6 (L) 03/06/2016   MCV 87.0 03/06/2016   PLT 252 03/06/2016    Recent Labs  02/19/16 0552 02/20/16 0338 02/21/16 0359  02/28/16 1005 03/02/16 1130 03/06/16 1025  NA 137 137 137  < > 131* 132* 134*  K 4.0 4.6 4.0  < > 4.2 4.5 4.7  CL 100* 99* 101  --   --   --   --   CO2 '28 29 29  '$ < > '26 26 26  '$ GLUCOSE 146* 161* 155*  < > 135 110 156*  BUN '17 20 20  '$ < > 17.5 13.7 11.7  CREATININE 0.76 0.77 0.62  < > 0.7 0.9 0.8  CALCIUM 8.9 9.1 9.0  < > 9.1 9.1 9.6  GFRNONAA >60 >60 >60  --   --   --   --   GFRAA >60 >60 >60  --   --   --   --   PROT 6.6 6.2* 6.0*  < > 6.3* 6.1* 6.6  ALBUMIN 2.8* 2.8* 2.8*  < > 3.0* 2.8* 3.1*  AST 37 36 27  < > '11 16 15  '$ ALT 47 53 43  < >  $'19 19 18  'D$ ALKPHOS 65 59 54  < > 65 63 71  BILITOT 0.5 0.5 0.8  < > 0.63 0.30 0.24  < > = values in this interval not displayed.  RADIOGRAPHIC STUDIES: I have personally reviewed the radiological images as listed and agreed with the findings in the report. Dg Chest 2 View  Result Date: 02/12/2016 CLINICAL DATA:  Fever and weakness. EXAM: CHEST  2 VIEW COMPARISON:  01/27/2016 FINDINGS: The heart size and mediastinal contours are within normal limits. Both lungs are clear. The visualized skeletal structures are unremarkable. IMPRESSION: No active cardiopulmonary disease. Electronically Signed   By: Kerby Moors M.D.   On: 02/12/2016 11:52   Ct Abdomen Pelvis W Contrast  Result Date: 02/14/2016 CLINICAL DATA:  Syncope, dehydration, sepsis. Diffuse large B-cell lymphoma. EXAM: CT ABDOMEN AND PELVIS WITH CONTRAST TECHNIQUE: Multidetector CT imaging of the abdomen and pelvis was performed using the standard protocol following bolus administration of  intravenous contrast. CONTRAST:  157m ISOVUE-300 IOPAMIDOL (ISOVUE-300) INJECTION 61% COMPARISON:  01/06/2016 FINDINGS: Lower chest: Small right pleural effusion. Hepatobiliary: Liver is within normal limits. Tiny layering gallstones (series 2/ image 20), without associated inflammatory changes. No intrahepatic extrahepatic ductal dilatation. Pancreas: Within normal limits. Spleen: Spleen is normal in size. Adrenals/Urinary Tract: Adrenal glands are within normal limits. 2.6 cm medial right upper pole renal cyst (series 2/ image 25). 9 mm anterior right lower pole renal cyst (series 2/ image 36). Mild diminished/delayed enhancement of the right kidney. Associated mild right hydronephrosis, new, likely secondary to extrinsic compression from right retroperitoneal lymphadenopathy. Mildly thick-walled bladder. Stomach/Bowel: Stomach is within normal limits. No evidence of bowel obstruction. Appendix is not discretely visualized. Sigmoid diverticulosis, without evidence of diverticulitis. Vascular/Lymphatic: No evidence of abdominal aortic aneurysm. Atherosclerotic calcifications of the abdominal aorta and branch vessels. Widespread abdominopelvic lymphadenopathy, including: --14 mm short axis right retrocrural node (series 2/ image 15), previously 17 mm --19 mm short axis left para-aortic node (series 2/ image 37), previously 27 mm, no longer necrotic --16 mm short axis aortocaval node (series 2/image 45), previously 18 mm, now necrotic --22 mm short axis left common iliac node (series 2/ image 55), previously 25 mm, now with increased necrosis --33 mm short axis right common iliac node (series 2/image 58), previously 24 mm, now necrotic --21 mm short axis right external iliac node (series 2/ image 69), previously 33 mm, no longer necrotic --29 mm short axis left obturator node (series 2/ image 35), previously 23 mm Reproductive: Prostate is grossly unremarkable. Other: Small volume pelvic ascites (series 2/image 76),  increased. Mild stranding along the right pericolic gutter (series 2/ image 51). Moderate fat containing left inguinal hernia, now with trace fluid inferiorly (series 2/ image 89). Musculoskeletal: Degenerative changes of the visualized thoracolumbar spine. IMPRESSION: Widespread retrocrural, retroperitoneal, and bilateral pelvic lymphadenopathy, waxing/waning when compared to recent CT, as above. Mild right hydronephrosis, likely secondary to extrinsic compression by right retroperitoneal lymphadenopathy, with associated diminished enhancement of the right kidney. Spleen is normal in size. Additional ancillary findings as above. Electronically Signed   By: SJulian HyM.D.   On: 02/14/2016 17:35   Ir UKoreaGuide Vasc Access Right  Result Date: 02/16/2016 CLINICAL DATA:  Large B-cell lymphoma and need for porta cath to begin chemotherapy. EXAM: IMPLANTED PORT A CATH PLACEMENT WITH ULTRASOUND AND FLUOROSCOPIC GUIDANCE ANESTHESIA/SEDATION: 5.0 mg IV Versed; 100 mcg IV Fentanyl Total Moderate Sedation Time:  30 minutes The patient's level of consciousness and physiologic status were continuously monitored  during the procedure by Radiology nursing. Additional Medications: 2 g IV Ancef. As antibiotic prophylaxis, Ancef was ordered pre-procedure and administered intravenously within one hour of incision. FLUOROSCOPY TIME:  12 seconds.  4.0 mGy. PROCEDURE: The procedure, risks, benefits, and alternatives were explained to the patient. Questions regarding the procedure were encouraged and answered. The patient understands and consents to the procedure. A time-out was performed prior to initiating the procedure. Ultrasound was utilized to confirm patency of the right internal jugular vein. The right neck and chest were prepped with chlorhexidine in a sterile fashion, and a sterile drape was applied covering the operative field. Maximum barrier sterile technique with sterile gowns and gloves were used for the  procedure. Local anesthesia was provided with 1% lidocaine. After creating a small venotomy incision, a 21 gauge needle was advanced into the right internal jugular vein under direct, real-time ultrasound guidance. Ultrasound image documentation was performed. After securing guidewire access, an 8 Fr dilator was placed. A J-wire was kinked to measure appropriate catheter length. A subcutaneous port pocket was then created along the upper chest wall utilizing sharp and blunt dissection. Portable cautery was utilized. The pocket was irrigated with sterile saline. A single lumen power injectable port was chosen for placement. The 8 Fr catheter was tunneled from the port pocket site to the venotomy incision. The port was placed in the pocket. External catheter was trimmed to appropriate length based on guidewire measurement. At the venotomy, an 8 Fr peel-away sheath was placed over a guidewire. The catheter was then placed through the sheath and the sheath removed. Final catheter positioning was confirmed and documented with a fluoroscopic spot image. The port was accessed with a needle and aspirated and flushed with heparinized saline. The access needle was removed. The venotomy and port pocket incisions were closed with subcutaneous 3-0 Monocryl and subcuticular 4-0 Vicryl. Dermabond was applied to both incisions. COMPLICATIONS: COMPLICATIONS None FINDINGS: After catheter placement, the tip lies at the cavo-atrial junction. The catheter aspirates normally and is ready for immediate use. IMPRESSION: Placement of single lumen port a cath via right internal jugular vein. The catheter tip lies at the cavo-atrial junction. A power injectable port a cath was placed and is ready for immediate use. Electronically Signed   By: Aletta Edouard M.D.   On: 02/16/2016 15:28   Ir US Guide Bx Asp/drain  Result Date: 02/07/2016 INDICATION: Left supraclavicular and retroperitoneal adenopathy, concern for lymphoproliferative  process. EXAM: IR ULTRASOUND GUIDANCE MEDICATIONS: 1 mg Versed, 25 mcg fentanyl ANESTHESIA/SEDATION: Moderate (conscious) sedation was employed during this procedure. A total of Versed 1.0 mg and Fentanyl 25 mcg was administered intravenously. Moderate Sedation Time: 11 minutes. The patient's level of consciousness and vital signs were monitored continuously by radiology nursing throughout the procedure under my direct supervision. FLUOROSCOPY TIME:  Fluoroscopy Time: None. COMPLICATIONS: None immediate. PROCEDURE: Informed written consent was obtained from the patient after a thorough discussion of the procedural risks, benefits and alternatives. All questions were addressed. Maximal Sterile Barrier Technique was utilized including caps, mask, sterile gowns, sterile gloves, sterile drape, hand hygiene and skin antiseptic. A timeout was performed prior to the initiation of the procedure. Previous imaging reviewed. Preliminary ultrasound performed. The left supraclavicular nodal mass was localized. Overlying skin marked. Under sterile conditions and local anesthesia, an 18 gauge core biopsy was advanced under direct ultrasound into the left supraclavicular nodal mass. Several 18 gauge core biopsies obtained under direct ultrasound. Images obtained for documentation. Samples placed in saline. Needle removed.  Postprocedure imaging demonstrates no large hematoma. Patient tolerated the biopsy well. No immediate complication. IMPRESSION: Successful ultrasound left supraclavicular nodal mass 18 gauge core biopsies Electronically Signed   By: Jerilynn Mages.  Shick M.D.   On: 02/07/2016 12:25   Ir Fluoro Guide Port Insertion Right  Result Date: 02/16/2016 CLINICAL DATA:  Large B-cell lymphoma and need for porta cath to begin chemotherapy. EXAM: IMPLANTED PORT A CATH PLACEMENT WITH ULTRASOUND AND FLUOROSCOPIC GUIDANCE ANESTHESIA/SEDATION: 5.0 mg IV Versed; 100 mcg IV Fentanyl Total Moderate Sedation Time:  30 minutes The patient's  level of consciousness and physiologic status were continuously monitored during the procedure by Radiology nursing. Additional Medications: 2 g IV Ancef. As antibiotic prophylaxis, Ancef was ordered pre-procedure and administered intravenously within one hour of incision. FLUOROSCOPY TIME:  12 seconds.  4.0 mGy. PROCEDURE: The procedure, risks, benefits, and alternatives were explained to the patient. Questions regarding the procedure were encouraged and answered. The patient understands and consents to the procedure. A time-out was performed prior to initiating the procedure. Ultrasound was utilized to confirm patency of the right internal jugular vein. The right neck and chest were prepped with chlorhexidine in a sterile fashion, and a sterile drape was applied covering the operative field. Maximum barrier sterile technique with sterile gowns and gloves were used for the procedure. Local anesthesia was provided with 1% lidocaine. After creating a small venotomy incision, a 21 gauge needle was advanced into the right internal jugular vein under direct, real-time ultrasound guidance. Ultrasound image documentation was performed. After securing guidewire access, an 8 Fr dilator was placed. A J-wire was kinked to measure appropriate catheter length. A subcutaneous port pocket was then created along the upper chest wall utilizing sharp and blunt dissection. Portable cautery was utilized. The pocket was irrigated with sterile saline. A single lumen power injectable port was chosen for placement. The 8 Fr catheter was tunneled from the port pocket site to the venotomy incision. The port was placed in the pocket. External catheter was trimmed to appropriate length based on guidewire measurement. At the venotomy, an 8 Fr peel-away sheath was placed over a guidewire. The catheter was then placed through the sheath and the sheath removed. Final catheter positioning was confirmed and documented with a fluoroscopic spot  image. The port was accessed with a needle and aspirated and flushed with heparinized saline. The access needle was removed. The venotomy and port pocket incisions were closed with subcutaneous 3-0 Monocryl and subcuticular 4-0 Vicryl. Dermabond was applied to both incisions. COMPLICATIONS: COMPLICATIONS None FINDINGS: After catheter placement, the tip lies at the cavo-atrial junction. The catheter aspirates normally and is ready for immediate use. IMPRESSION: Placement of single lumen port a cath via right internal jugular vein. The catheter tip lies at the cavo-atrial junction. A power injectable port a cath was placed and is ready for immediate use. Electronically Signed   By: Aletta Edouard M.D.   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 The patient appears to respond well to treatment clinically. He is admitted to the hospital for cycle 2 of treatment. He will return on December 14 to the cancer center for Neulasta injection. The following week, he will get blood drawn twice a week for blood count monitoring I plan to repeat PET CT scan after Christmas holidays to assess  response to treatment. The patient may benefit from bone marrow transplant evaluation in the future if he demonstrated excellent response to treatment  Essential hypertension His blood pressure is running low. I recommend he discontinue lisinopril.  Protein-calorie malnutrition, moderate ([place]) He has lost some weight. However, his appetite is preserved. Recommend he continue nutritional intake as tolerated  DVT prophylaxis On Lovenox  History of severe constipation I will schedule regular Senokot and Miralax  CODE STATUS Full code  Discharge planning He will be discharge next week after completion of treatment  All questions were  answered. The patient knows to call the clinic with any problems, questions or concerns.    Heath Lark, MD 03/08/2016 8:27 AM

## 2016-03-08 NOTE — Progress Notes (Cosign Needed)
Chemotherapy Rituxan  dosage and calculations checked and verified with Marisa Sprinkles RN.

## 2016-03-09 DIAGNOSIS — D72829 Elevated white blood cell count, unspecified: Secondary | ICD-10-CM

## 2016-03-09 DIAGNOSIS — R634 Abnormal weight loss: Secondary | ICD-10-CM

## 2016-03-09 DIAGNOSIS — I959 Hypotension, unspecified: Secondary | ICD-10-CM

## 2016-03-09 DIAGNOSIS — E440 Moderate protein-calorie malnutrition: Secondary | ICD-10-CM

## 2016-03-09 DIAGNOSIS — D649 Anemia, unspecified: Secondary | ICD-10-CM

## 2016-03-09 DIAGNOSIS — E785 Hyperlipidemia, unspecified: Secondary | ICD-10-CM

## 2016-03-09 DIAGNOSIS — K5900 Constipation, unspecified: Secondary | ICD-10-CM

## 2016-03-09 LAB — CBC WITH DIFFERENTIAL/PLATELET
BASOS ABS: 0 10*3/uL (ref 0.0–0.1)
BASOS PCT: 0 %
Eosinophils Absolute: 0 10*3/uL (ref 0.0–0.7)
Eosinophils Relative: 0 %
HCT: 33.5 % — ABNORMAL LOW (ref 39.0–52.0)
HEMOGLOBIN: 11.2 g/dL — AB (ref 13.0–17.0)
Lymphocytes Relative: 7 %
Lymphs Abs: 1.1 10*3/uL (ref 0.7–4.0)
MCH: 29.2 pg (ref 26.0–34.0)
MCHC: 33.4 g/dL (ref 30.0–36.0)
MCV: 87.2 fL (ref 78.0–100.0)
MONOS PCT: 3 %
Monocytes Absolute: 0.4 10*3/uL (ref 0.1–1.0)
NEUTROS ABS: 14.1 10*3/uL — AB (ref 1.7–7.7)
NEUTROS PCT: 90 %
Platelets: 258 10*3/uL (ref 150–400)
RBC: 3.84 MIL/uL — ABNORMAL LOW (ref 4.22–5.81)
RDW: 15.1 % (ref 11.5–15.5)
WBC: 15.6 10*3/uL — ABNORMAL HIGH (ref 4.0–10.5)

## 2016-03-09 LAB — COMPREHENSIVE METABOLIC PANEL
ALBUMIN: 3.1 g/dL — AB (ref 3.5–5.0)
ALK PHOS: 53 U/L (ref 38–126)
ALT: 16 U/L — AB (ref 17–63)
AST: 16 U/L (ref 15–41)
Anion gap: 7 (ref 5–15)
BUN: 10 mg/dL (ref 6–20)
CALCIUM: 9 mg/dL (ref 8.9–10.3)
CO2: 29 mmol/L (ref 22–32)
CREATININE: 0.68 mg/dL (ref 0.61–1.24)
Chloride: 101 mmol/L (ref 101–111)
GFR calc Af Amer: >60 mL/min (ref >60)
GFR calc non Af Amer: >60 mL/min (ref >60)
GLUCOSE: 161 mg/dL — AB (ref 65–99)
Potassium: 5.2 mmol/L — ABNORMAL HIGH (ref 3.5–5.1)
SODIUM: 137 mmol/L (ref 135–145)
Total Bilirubin: 0.7 mg/dL (ref 0.3–1.2)
Total Protein: 6 g/dL — ABNORMAL LOW (ref 6.5–8.1)

## 2016-03-09 LAB — URIC ACID: Uric Acid, Serum: 2.2 mg/dL — ABNORMAL LOW (ref 4.4–7.6)

## 2016-03-09 MED ORDER — VINCRISTINE SULFATE CHEMO INJECTION 1 MG/ML
Freq: Once | INTRAVENOUS | Status: AC
  Administered 2016-03-09: 14:00:00 via INTRAVENOUS
  Filled 2016-03-09: qty 11

## 2016-03-09 MED ORDER — SODIUM CHLORIDE 0.9 % IV SOLN
Freq: Once | INTRAVENOUS | Status: AC
  Administered 2016-03-09: 8 mg via INTRAVENOUS
  Filled 2016-03-09: qty 4

## 2016-03-09 NOTE — Progress Notes (Signed)
Samuel Berger   DOB:[birth year]   [id number]    Subjective: Today is cycle 2, day 2 of chemotherapy. He tolerated treatment well with no side effects. Denies nausea, mucositis or constipation  Objective:  Vitals:   03/08/16 2045 03/09/16 0541  BP: 109/66 115/64  Pulse: 83 73  Resp: 17 17  Temp: 98.4 F (36.9 C) 97.3 F (36.3 C)     Intake/Output Summary (Last 24 hours) at 03/09/16 [id number] Last data filed at 03/09/16 [id number]  Gross per 24 hour  Intake          1708.17 ml  Output                0 ml  Net          1708.17 ml    GENERAL:alert, no distress and comfortable SKIN: skin color, texture, turgor are normal, no rashes or significant lesions EYES: normal, Conjunctiva are pink and non-injected, sclera clear OROPHARYNX:no exudate, no erythema and lips, buccal mucosa, and tongue normal  NECK: supple, thyroid normal size, non-tender, without nodularity LYMPH:  no palpable lymphadenopathy in the cervical, axillary or inguinal LUNGS: clear to auscultation and percussion with normal breathing effort HEART: regular rate & rhythm and no murmurs and no lower extremity edema ABDOMEN:abdomen soft, non-tender and normal bowel sounds Musculoskeletal:no cyanosis of digits and no clubbing  NEURO: alert & oriented x 3 with fluent speech, no focal motor/sensory deficits   Labs:  Lab Results  Component Value Date   WBC 15.6 (H) 03/09/2016   HGB 11.2 (L) 03/09/2016   HCT 33.5 (L) 03/09/2016   MCV 87.2 03/09/2016   PLT 258 03/09/2016   NEUTROABS 14.1 (H) 03/09/2016    Lab Results  Component Value Date   NA 137 03/09/2016   K 5.2 (H) 03/09/2016   CL 101 03/09/2016   CO2 29 03/09/2016    Assessment & Plan:   Diffuse large B-cell lymphoma of lymph nodes of multiple regions ([place]) I reviewed recent FISHpanel on tissue sample which demonstrated extra copies of BCL 6, BCL 2 and MYC. The patient appears to respond well to treatment clinically. The patient started cycle 2 on  03/08/2016 So far, he tolerated treatment well  Essential hypertension His blood pressure is running low. I recommend he discontinued lisinopril.  Protein-calorie malnutrition, moderate ([place]) He has lost some weight. However, his appetite is preserved. Recommend he continue nutritional intake as tolerated  Anemia with mild leukocytosis Due to side effects of treatment He is not symptomatic. Observe only  Mild hyperkalemia Could be early signs of tumor lysis. Observe closely He is on allopurinol and hydration  DVT prophylaxis On Lovenox  History of severe constipation I will schedule regular Senokot and Miralax  CODE STATUS Full code  Discharge planning He will be discharge next week after completion of treatment   Heath Lark, MD 03/09/2016  8:33 AM

## 2016-03-10 DIAGNOSIS — C8338 Diffuse large B-cell lymphoma, lymph nodes of multiple sites: Secondary | ICD-10-CM

## 2016-03-10 LAB — COMPREHENSIVE METABOLIC PANEL
ALBUMIN: 3.1 g/dL — AB (ref 3.5–5.0)
ALT: 15 U/L — AB (ref 17–63)
AST: 13 U/L — AB (ref 15–41)
Alkaline Phosphatase: 48 U/L (ref 38–126)
Anion gap: 7 (ref 5–15)
BUN: 16 mg/dL (ref 6–20)
CHLORIDE: 101 mmol/L (ref 101–111)
CO2: 28 mmol/L (ref 22–32)
CREATININE: 0.65 mg/dL (ref 0.61–1.24)
Calcium: 8.7 mg/dL — ABNORMAL LOW (ref 8.9–10.3)
GFR calc Af Amer: >60 mL/min (ref >60)
GFR calc non Af Amer: >60 mL/min (ref >60)
Glucose, Bld: 147 mg/dL — ABNORMAL HIGH (ref 65–99)
POTASSIUM: 4.2 mmol/L (ref 3.5–5.1)
SODIUM: 136 mmol/L (ref 135–145)
Total Bilirubin: 0.4 mg/dL (ref 0.3–1.2)
Total Protein: 5.7 g/dL — ABNORMAL LOW (ref 6.5–8.1)

## 2016-03-10 LAB — URIC ACID: Uric Acid, Serum: 2.1 mg/dL — ABNORMAL LOW (ref 4.4–7.6)

## 2016-03-10 MED ORDER — VINCRISTINE SULFATE CHEMO INJECTION 1 MG/ML
Freq: Once | INTRAVENOUS | Status: AC
  Administered 2016-03-10: 14:00:00 via INTRAVENOUS
  Filled 2016-03-10 (×2): qty 11

## 2016-03-10 MED ORDER — SODIUM CHLORIDE 0.9 % IV SOLN
Freq: Once | INTRAVENOUS | Status: AC
  Administered 2016-03-10: 8 mg via INTRAVENOUS
  Filled 2016-03-10: qty 4

## 2016-03-10 NOTE — Progress Notes (Signed)
Samuel Berger   DOB:February 22, 1946   [id number]   [id number]  ONCOLOGY FOLLOW UP   Subjective: I am covering my partner Dr. Alvy Bimler to see pt this weekend. He is tolerating chemotherapy very well, denies any nausea,  pain, or other symptoms. He has been ambulating in the hallway. VSs are stable, lab reviewed.    Objective:  Vitals:   03/09/16 2028 03/10/16 0420  BP: 112/66 118/66  Pulse: 82 73  Resp: 16 16  Temp: 97.9 F (36.6 C) 97.6 F (36.4 C)    Body mass index is 25.87 kg/m.  Intake/Output Summary (Last 24 hours) at 03/10/16 1211 Last data filed at 03/09/16 2211  Gross per 24 hour  Intake          1695.34 ml  Output                0 ml  Net          1695.34 ml     Sclerae unicteric  Oropharynx clear  No peripheral adenopathy  Lungs clear -- no rales or rhonchi  Heart regular rate and rhythm  Abdomen benign  MSK no focal spinal tenderness, no peripheral edema  Neuro nonfocal   CBG (last 3)  No results for input(s): GLUCAP in the last 72 hours.   Labs:  Lab Results  Component Value Date   WBC 15.6 (H) 03/09/2016   HGB 11.2 (L) 03/09/2016   HCT 33.5 (L) 03/09/2016   MCV 87.2 03/09/2016   PLT 258 03/09/2016   NEUTROABS 14.1 (H) 03/09/2016   CMP Latest Ref Rng & Units 03/10/2016 03/09/2016 03/06/2016  Glucose 65 - 99 mg/dL 147(H) 161(H) 156(H)  BUN 6 - 20 mg/dL 16 10 11.7  Creatinine 0.61 - 1.24 mg/dL 0.65 0.68 0.8  Sodium 135 - 145 mmol/L 136 137 134(L)  Potassium 3.5 - 5.1 mmol/L 4.2 5.2(H) 4.7  Chloride 101 - 111 mmol/L 101 101 -  CO2 22 - 32 mmol/L 28 29 26   Calcium 8.9 - 10.3 mg/dL 8.7(L) 9.0 9.6  Total Protein 6.5 - 8.1 g/dL 5.7(L) 6.0(L) 6.6  Total Bilirubin 0.3 - 1.2 mg/dL 0.4 0.7 0.24  Alkaline Phos 38 - 126 U/L 48 53 71  AST 15 - 41 U/L 13(L) 16 15  ALT 17 - 63 U/L 15(L) 16(L) 18     Urine Studies No results for input(s): UHGB, CRYS in the last 72 hours.  Invalid input(s): UACOL, UAPR, USPG, UPH, UTP, UGL, UKET, UBIL, UNIT, UROB,  ULEU, UEPI, UWBC, URBC, UBAC, CAST, [place], [place]  Basic Metabolic Panel:  Recent Labs Lab 03/06/16 1025 03/09/16 0349 03/10/16 0413  NA 134* 137 136  K 4.7 5.2* 4.2  CL  --  101 101  CO2 26 29 28   GLUCOSE 156* 161* 147*  BUN 11.7 10 16   CREATININE 0.8 0.68 0.65  CALCIUM 9.6 9.0 8.7*   GFR Estimated Creatinine Clearance: 90.2 mL/min (by C-G formula based on SCr of 0.65 mg/dL). Liver Function Tests:  Recent Labs Lab 03/06/16 1025 03/09/16 0349 03/10/16 0413  AST 15 16 13*  ALT 18 16* 15*  ALKPHOS 71 53 48  BILITOT 0.24 0.7 0.4  PROT 6.6 6.0* 5.7*  ALBUMIN 3.1* 3.1* 3.1*   No results for input(s): LIPASE, AMYLASE in the last 168 hours. No results for input(s): AMMONIA in the last 168 hours. Coagulation profile No results for input(s): INR, PROTIME in the last 168 hours.  CBC:  Recent Labs Lab 03/06/16 1025 03/09/16 0349  WBC 11.8* 15.6*  NEUTROABS 9.5* 14.1*  HGB 12.1* 11.2*  HCT 37.6* 33.5*  MCV 87.0 87.2  PLT 252 258   Cardiac Enzymes: No results for input(s): CKTOTAL, CKMB, CKMBINDEX, TROPONINI in the last 168 hours. BNP: Invalid input(s): POCBNP CBG: No results for input(s): GLUCAP in the last 168 hours. D-Dimer No results for input(s): DDIMER in the last 72 hours. Hgb A1c No results for input(s): HGBA1C in the last 72 hours. Lipid Profile No results for input(s): CHOL, HDL, LDLCALC, TRIG, CHOLHDL, LDLDIRECT in the last 72 hours. Thyroid function studies No results for input(s): TSH, T4TOTAL, T3FREE, THYROIDAB in the last 72 hours.  Invalid input(s): FREET3 Anemia work up No results for input(s): VITAMINB12, FOLATE, FERRITIN, TIBC, IRON, RETICCTPCT in the last 72 hours. Microbiology No results found for this or any previous visit (from the past 240 hour(s)).    Studies:  No results found.  Assessment: 70 y.o. with diffuse large B-cell lymphoma, was admitted for cycle 2 chemotherapy  1. Diffuse large B-cell lymphoma of lymph nodes of  multiple regions 2. Essential hypertension 3. Anemia and mild leukocytosis 4. History of constipation  Plan:  -continue chemo R-EPOCH day 3 today  -repeat lab on Monday  -I encourage him to ambulate, he is on lovenox for DVT prophylaxis -Plan to discharge patient home after completion of chemotherapy treatment    Truitt Merle, MD 03/10/2016  12:11 PM

## 2016-03-11 LAB — COMPREHENSIVE METABOLIC PANEL
ALK PHOS: 47 U/L (ref 38–126)
ALT: 15 U/L — AB (ref 17–63)
AST: 14 U/L — ABNORMAL LOW (ref 15–41)
Albumin: 3.2 g/dL — ABNORMAL LOW (ref 3.5–5.0)
Anion gap: 7 (ref 5–15)
BILIRUBIN TOTAL: 0.6 mg/dL (ref 0.3–1.2)
BUN: 18 mg/dL (ref 6–20)
CALCIUM: 9 mg/dL (ref 8.9–10.3)
CO2: 29 mmol/L (ref 22–32)
CREATININE: 0.71 mg/dL (ref 0.61–1.24)
Chloride: 100 mmol/L — ABNORMAL LOW (ref 101–111)
Glucose, Bld: 160 mg/dL — ABNORMAL HIGH (ref 65–99)
Potassium: 4.8 mmol/L (ref 3.5–5.1)
Sodium: 136 mmol/L (ref 135–145)
TOTAL PROTEIN: 5.8 g/dL — AB (ref 6.5–8.1)

## 2016-03-11 LAB — URIC ACID: URIC ACID, SERUM: 2.4 mg/dL — AB (ref 4.4–7.6)

## 2016-03-11 MED ORDER — VINCRISTINE SULFATE CHEMO INJECTION 1 MG/ML
Freq: Once | INTRAVENOUS | Status: AC
  Administered 2016-03-11: 14:00:00 via INTRAVENOUS
  Filled 2016-03-11 (×2): qty 11

## 2016-03-11 MED ORDER — SODIUM CHLORIDE 0.9 % IV SOLN
Freq: Once | INTRAVENOUS | Status: AC
  Administered 2016-03-11: 8 mg via INTRAVENOUS
  Filled 2016-03-11: qty 4

## 2016-03-11 NOTE — Progress Notes (Signed)
Etoposide, Vincristine & Doxorubicin dosage and calculations checked and reviewed with Aldean Baker, RN.

## 2016-03-11 NOTE — Progress Notes (Signed)
Samuel Berger   DOB:[birth year]   [id number]   [id number]  ONCOLOGY FOLLOW UP   Subjective: Pt is tolerating chemotherapy very well, denies any nausea, mouth sore or other side effects. No leg swollen, he had good bowel movement this morning. He has been ambulating in the hallway. No other complaints, VS normal.    Objective:  Vitals:   03/10/16 2022 03/11/16 0653  BP: 129/60 122/65  Pulse: 80 65  Resp: 16 20  Temp: 98.2 F (36.8 C) 98.4 F (36.9 C)    Body mass index is 25.87 kg/m.  Intake/Output Summary (Last 24 hours) at 03/11/16 1150 Last data filed at 03/10/16 1700  Gross per 24 hour  Intake           1081.9 ml  Output                0 ml  Net           1081.9 ml     Sclerae unicteric  Oropharynx clear  No peripheral adenopathy  Lungs clear -- no rales or rhonchi  Heart regular rate and rhythm  Abdomen benign  MSK no focal spinal tenderness, no peripheral edema  Neuro nonfocal   CBG (last 3)  No results for input(s): GLUCAP in the last 72 hours.   Labs:  Lab Results  Component Value Date   WBC 15.6 (H) 03/09/2016   HGB 11.2 (L) 03/09/2016   HCT 33.5 (L) 03/09/2016   MCV 87.2 03/09/2016   PLT 258 03/09/2016   NEUTROABS 14.1 (H) 03/09/2016   CMP Latest Ref Rng & Units 03/11/2016 03/10/2016 03/09/2016  Glucose 65 - 99 mg/dL 160(H) 147(H) 161(H)  BUN 6 - 20 mg/dL 18 16 10   Creatinine 0.61 - 1.24 mg/dL 0.71 0.65 0.68  Sodium 135 - 145 mmol/L 136 136 137  Potassium 3.5 - 5.1 mmol/L 4.8 4.2 5.2(H)  Chloride 101 - 111 mmol/L 100(L) 101 101  CO2 22 - 32 mmol/L 29 28 29   Calcium 8.9 - 10.3 mg/dL 9.0 8.7(L) 9.0  Total Protein 6.5 - 8.1 g/dL 5.8(L) 5.7(L) 6.0(L)  Total Bilirubin 0.3 - 1.2 mg/dL 0.6 0.4 0.7  Alkaline Phos 38 - 126 U/L 47 48 53  AST 15 - 41 U/L 14(L) 13(L) 16  ALT 17 - 63 U/L 15(L) 15(L) 16(L)     Urine Studies No results for input(s): UHGB, CRYS in the last 72 hours.  Invalid input(s): UACOL, UAPR, USPG, UPH, UTP, UGL, UKET, UBIL,  UNIT, UROB, ULEU, UEPI, UWBC, URBC, UBAC, CAST, [place], [place]  Basic Metabolic Panel:  Recent Labs Lab 03/06/16 1025  03/09/16 0349 03/10/16 0413 03/11/16 0411  NA 134*  --  137 136 136  K 4.7  < > 5.2* 4.2 4.8  CL  --   --  101 101 100*  CO2 26  --  29 28 29   GLUCOSE 156*  --  161* 147* 160*  BUN 11.7  --  10 16 18   CREATININE 0.8  --  0.68 0.65 0.71  CALCIUM 9.6  --  9.0 8.7* 9.0  < > = values in this interval not displayed. GFR Estimated Creatinine Clearance: 90.2 mL/min (by C-G formula based on SCr of 0.71 mg/dL). Liver Function Tests:  Recent Labs Lab 03/06/16 1025 03/09/16 0349 03/10/16 0413 03/11/16 0411  AST 15 16 13* 14*  ALT 18 16* 15* 15*  ALKPHOS 71 53 48 47  BILITOT 0.24 0.7 0.4 0.6  PROT 6.6 6.0* 5.7* 5.8*  ALBUMIN 3.1* 3.1* 3.1* 3.2*   No results for input(s): LIPASE, AMYLASE in the last 168 hours. No results for input(s): AMMONIA in the last 168 hours. Coagulation profile No results for input(s): INR, PROTIME in the last 168 hours.  CBC:  Recent Labs Lab 03/06/16 1025 03/09/16 0349  WBC 11.8* 15.6*  NEUTROABS 9.5* 14.1*  HGB 12.1* 11.2*  HCT 37.6* 33.5*  MCV 87.0 87.2  PLT 252 258   Cardiac Enzymes: No results for input(s): CKTOTAL, CKMB, CKMBINDEX, TROPONINI in the last 168 hours. BNP: Invalid input(s): POCBNP CBG: No results for input(s): GLUCAP in the last 168 hours. D-Dimer No results for input(s): DDIMER in the last 72 hours. Hgb A1c No results for input(s): HGBA1C in the last 72 hours. Lipid Profile No results for input(s): CHOL, HDL, LDLCALC, TRIG, CHOLHDL, LDLDIRECT in the last 72 hours. Thyroid function studies No results for input(s): TSH, T4TOTAL, T3FREE, THYROIDAB in the last 72 hours.  Invalid input(s): FREET3 Anemia work up No results for input(s): VITAMINB12, FOLATE, FERRITIN, TIBC, IRON, RETICCTPCT in the last 72 hours. Microbiology No results found for this or any previous visit (from the past 240  hour(s)).    Studies:  No results found.  Assessment: 70 y.o. with diffuse large B-cell lymphoma, was admitted for cycle 2 chemotherapy  1. Diffuse large B-cell lymphoma of lymph nodes of multiple regions 2. Essential hypertension 3. Anemia and mild leukocytosis, stable  4. History of constipation, controlled   Plan:  -continue chemo R-EPOCH day 4 today  -repeat CBC and CMP tomorrow   -I encourage him to ambulate, he is on lovenox for DVT prophylaxis -Plan to discharge patient home after completion of chemotherapy treatment    Truitt Merle, MD 03/11/2016  11:50 AM

## 2016-03-11 NOTE — Progress Notes (Signed)
Etoposide, Vincristine & Doxorubicin dosage and calculations verified with Wilkie Aye RN.

## 2016-03-12 DIAGNOSIS — Z5111 Encounter for antineoplastic chemotherapy: Principal | ICD-10-CM

## 2016-03-12 LAB — COMPREHENSIVE METABOLIC PANEL
ALBUMIN: 2.9 g/dL — AB (ref 3.5–5.0)
ALT: 16 U/L — ABNORMAL LOW (ref 17–63)
ANION GAP: 6 (ref 5–15)
AST: 17 U/L (ref 15–41)
Alkaline Phosphatase: 44 U/L (ref 38–126)
BUN: 19 mg/dL (ref 6–20)
CHLORIDE: 100 mmol/L — AB (ref 101–111)
CO2: 30 mmol/L (ref 22–32)
Calcium: 8.6 mg/dL — ABNORMAL LOW (ref 8.9–10.3)
Creatinine, Ser: 0.62 mg/dL (ref 0.61–1.24)
GFR calc Af Amer: >60 mL/min (ref >60)
GFR calc non Af Amer: >60 mL/min (ref >60)
GLUCOSE: 151 mg/dL — AB (ref 65–99)
POTASSIUM: 4 mmol/L (ref 3.5–5.1)
SODIUM: 136 mmol/L (ref 135–145)
Total Bilirubin: 0.6 mg/dL (ref 0.3–1.2)
Total Protein: 5.5 g/dL — ABNORMAL LOW (ref 6.5–8.1)

## 2016-03-12 LAB — CBC WITH DIFFERENTIAL/PLATELET
BASOS PCT: 0 %
Basophils Absolute: 0 10*3/uL (ref 0.0–0.1)
Eosinophils Absolute: 0 10*3/uL (ref 0.0–0.7)
Eosinophils Relative: 0 %
HEMATOCRIT: 31.3 % — AB (ref 39.0–52.0)
HEMOGLOBIN: 10.7 g/dL — AB (ref 13.0–17.0)
LYMPHS ABS: 0.7 10*3/uL (ref 0.7–4.0)
Lymphocytes Relative: 11 %
MCH: 29.4 pg (ref 26.0–34.0)
MCHC: 34.2 g/dL (ref 30.0–36.0)
MCV: 86 fL (ref 78.0–100.0)
MONOS PCT: 3 %
Monocytes Absolute: 0.2 10*3/uL (ref 0.1–1.0)
NEUTROS ABS: 5.8 10*3/uL (ref 1.7–7.7)
NEUTROS PCT: 86 %
Platelets: 251 10*3/uL (ref 150–400)
RBC: 3.64 MIL/uL — ABNORMAL LOW (ref 4.22–5.81)
RDW: 14.8 % (ref 11.5–15.5)
WBC: 6.7 10*3/uL (ref 4.0–10.5)

## 2016-03-12 LAB — URIC ACID: Uric Acid, Serum: 2.4 mg/dL — ABNORMAL LOW (ref 4.4–7.6)

## 2016-03-12 MED ORDER — HEPARIN SOD (PORK) LOCK FLUSH 100 UNIT/ML IV SOLN
500.0000 [IU] | Freq: Once | INTRAVENOUS | Status: AC
  Administered 2016-03-12: 500 [IU] via INTRAVENOUS
  Filled 2016-03-12: qty 5

## 2016-03-12 MED ORDER — CYCLOPHOSPHAMIDE CHEMO INJECTION 1 GM
750.0000 mg/m2 | Freq: Once | INTRAMUSCULAR | Status: AC
  Administered 2016-03-12: 1600 mg via INTRAVENOUS
  Filled 2016-03-12: qty 80

## 2016-03-12 MED ORDER — SODIUM CHLORIDE 0.9 % IV SOLN
Freq: Once | INTRAVENOUS | Status: AC
  Administered 2016-03-12: 16 mg via INTRAVENOUS
  Filled 2016-03-12: qty 8

## 2016-03-12 NOTE — Discharge Summary (Signed)
Physician Discharge Summary  Patient ID: Samuel Berger MRN: [id number] DOB/AGE: 1945-06-11 70 y.o.  Admit date: 03/08/2016 Discharge date: 03/12/2016  Primary Care Physician:  Wenda Low, MD   Discharge Diagnoses:    Present on Admission: . Diffuse large B cell lymphoma ([place])   Discharge Medications:    Medication List    STOP taking these medications   dexamethasone 4 MG tablet Commonly known as:  DECADRON     TAKE these medications   allopurinol 300 MG tablet Commonly known as:  ZYLOPRIM Take 1 tablet (300 mg total) by mouth daily.   amLODipine 10 MG tablet Commonly known as:  NORVASC Take 10 mg by mouth daily.   atorvastatin 40 MG tablet Commonly known as:  LIPITOR Take 40 mg by mouth daily at 6 PM.   clopidogrel 75 MG tablet Commonly known as:  PLAVIX Take 75 mg by mouth daily with breakfast.   lactulose 10 GM/15ML solution Commonly known as:  CHRONULAC Take 15 mLs (10 g total) by mouth 3 (three) times daily.   lidocaine-prilocaine cream Commonly known as:  EMLA Apply 1 application topically as needed. What changed:  when to take this  reasons to take this   lisinopril 5 MG tablet Commonly known as:  PRINIVIL,ZESTRIL Take 5 mg by mouth daily.   ondansetron 8 MG tablet Commonly known as:  ZOFRAN Take 1 tablet (8 mg total) by mouth every 8 (eight) hours as needed for nausea or vomiting.   pioglitazone 30 MG tablet Commonly known as:  ACTOS Take 30 mg by mouth daily.   prochlorperazine 10 MG tablet Commonly known as:  COMPAZINE Take 1 tablet (10 mg total) by mouth every 6 (six) hours as needed.   sennosides-docusate sodium 8.6-50 MG tablet Commonly known as:  SENOKOT-S Take 2 tablets by mouth daily.       Disposition and Follow-up:   Significant Diagnostic Studies:  Dg Chest 2 View  Result Date: 02/12/2016 CLINICAL DATA:  Fever and weakness. EXAM: CHEST  2 VIEW COMPARISON:  01/27/2016 FINDINGS: The heart size and  mediastinal contours are within normal limits. Both lungs are clear. The visualized skeletal structures are unremarkable. IMPRESSION: No active cardiopulmonary disease. Electronically Signed   By: Kerby Moors M.D.   On: 02/12/2016 11:52   Ct Abdomen Pelvis W Contrast  Result Date: 02/14/2016 CLINICAL DATA:  Syncope, dehydration, sepsis. Diffuse large B-cell lymphoma. EXAM: CT ABDOMEN AND PELVIS WITH CONTRAST TECHNIQUE: Multidetector CT imaging of the abdomen and pelvis was performed using the standard protocol following bolus administration of intravenous contrast. CONTRAST:  164mL ISOVUE-300 IOPAMIDOL (ISOVUE-300) INJECTION 61% COMPARISON:  01/06/2016 FINDINGS: Lower chest: Small right pleural effusion. Hepatobiliary: Liver is within normal limits. Tiny layering gallstones (series 2/ image 20), without associated inflammatory changes. No intrahepatic extrahepatic ductal dilatation. Pancreas: Within normal limits. Spleen: Spleen is normal in size. Adrenals/Urinary Tract: Adrenal glands are within normal limits. 2.6 cm medial right upper pole renal cyst (series 2/ image 25). 9 mm anterior right lower pole renal cyst (series 2/ image 36). Mild diminished/delayed enhancement of the right kidney. Associated mild right hydronephrosis, new, likely secondary to extrinsic compression from right retroperitoneal lymphadenopathy. Mildly thick-walled bladder. Stomach/Bowel: Stomach is within normal limits. No evidence of bowel obstruction. Appendix is not discretely visualized. Sigmoid diverticulosis, without evidence of diverticulitis. Vascular/Lymphatic: No evidence of abdominal aortic aneurysm. Atherosclerotic calcifications of the abdominal aorta and branch vessels. Widespread abdominopelvic lymphadenopathy, including: --14 mm short axis right retrocrural node (series 2/ image 15), previously  17 mm --19 mm short axis left para-aortic node (series 2/ image 37), previously 27 mm, no longer necrotic --16 mm short axis  aortocaval node (series 2/image 45), previously 18 mm, now necrotic --22 mm short axis left common iliac node (series 2/ image 55), previously 25 mm, now with increased necrosis --33 mm short axis right common iliac node (series 2/image 58), previously 24 mm, now necrotic --21 mm short axis right external iliac node (series 2/ image 69), previously 33 mm, no longer necrotic --29 mm short axis left obturator node (series 2/ image 35), previously 23 mm Reproductive: Prostate is grossly unremarkable. Other: Small volume pelvic ascites (series 2/image 76), increased. Mild stranding along the right pericolic gutter (series 2/ image 51). Moderate fat containing left inguinal hernia, now with trace fluid inferiorly (series 2/ image 89). Musculoskeletal: Degenerative changes of the visualized thoracolumbar spine. IMPRESSION: Widespread retrocrural, retroperitoneal, and bilateral pelvic lymphadenopathy, waxing/waning when compared to recent CT, as above. Mild right hydronephrosis, likely secondary to extrinsic compression by right retroperitoneal lymphadenopathy, with associated diminished enhancement of the right kidney. Spleen is normal in size. Additional ancillary findings as above. Electronically Signed   By: Julian Hy M.D.   On: 02/14/2016 17:35   Ir US Guide Vasc Access Right  Result Date: 02/16/2016 CLINICAL DATA:  Large B-cell lymphoma and need for porta cath to begin chemotherapy. EXAM: IMPLANTED PORT A CATH PLACEMENT WITH ULTRASOUND AND FLUOROSCOPIC GUIDANCE ANESTHESIA/SEDATION: 5.0 mg IV Versed; 100 mcg IV Fentanyl Total Moderate Sedation Time:  30 minutes The patient's level of consciousness and physiologic status were continuously monitored during the procedure by Radiology nursing. Additional Medications: 2 g IV Ancef. As antibiotic prophylaxis, Ancef was ordered pre-procedure and administered intravenously within one hour of incision. FLUOROSCOPY TIME:  12 seconds.  4.0 mGy. PROCEDURE: The  procedure, risks, benefits, and alternatives were explained to the patient. Questions regarding the procedure were encouraged and answered. The patient understands and consents to the procedure. A time-out was performed prior to initiating the procedure. Ultrasound was utilized to confirm patency of the right internal jugular vein. The right neck and chest were prepped with chlorhexidine in a sterile fashion, and a sterile drape was applied covering the operative field. Maximum barrier sterile technique with sterile gowns and gloves were used for the procedure. Local anesthesia was provided with 1% lidocaine. After creating a small venotomy incision, a 21 gauge needle was advanced into the right internal jugular vein under direct, real-time ultrasound guidance. Ultrasound image documentation was performed. After securing guidewire access, an 8 Fr dilator was placed. A J-wire was kinked to measure appropriate catheter length. A subcutaneous port pocket was then created along the upper chest wall utilizing sharp and blunt dissection. Portable cautery was utilized. The pocket was irrigated with sterile saline. A single lumen power injectable port was chosen for placement. The 8 Fr catheter was tunneled from the port pocket site to the venotomy incision. The port was placed in the pocket. External catheter was trimmed to appropriate length based on guidewire measurement. At the venotomy, an 8 Fr peel-away sheath was placed over a guidewire. The catheter was then placed through the sheath and the sheath removed. Final catheter positioning was confirmed and documented with a fluoroscopic spot image. The port was accessed with a needle and aspirated and flushed with heparinized saline. The access needle was removed. The venotomy and port pocket incisions were closed with subcutaneous 3-0 Monocryl and subcuticular 4-0 Vicryl. Dermabond was applied to both incisions. COMPLICATIONS: COMPLICATIONS  None FINDINGS: After catheter  placement, the tip lies at the cavo-atrial junction. The catheter aspirates normally and is ready for immediate use. IMPRESSION: Placement of single lumen port a cath via right internal jugular vein. The catheter tip lies at the cavo-atrial junction. A power injectable port a cath was placed and is ready for immediate use. Electronically Signed   By: Aletta Edouard M.D.   On: 02/16/2016 15:28   Ir Fluoro Guide Port Insertion Right  Result Date: 02/16/2016 CLINICAL DATA:  Large B-cell lymphoma and need for porta cath to begin chemotherapy. EXAM: IMPLANTED PORT A CATH PLACEMENT WITH ULTRASOUND AND FLUOROSCOPIC GUIDANCE ANESTHESIA/SEDATION: 5.0 mg IV Versed; 100 mcg IV Fentanyl Total Moderate Sedation Time:  30 minutes The patient's level of consciousness and physiologic status were continuously monitored during the procedure by Radiology nursing. Additional Medications: 2 g IV Ancef. As antibiotic prophylaxis, Ancef was ordered pre-procedure and administered intravenously within one hour of incision. FLUOROSCOPY TIME:  12 seconds.  4.0 mGy. PROCEDURE: The procedure, risks, benefits, and alternatives were explained to the patient. Questions regarding the procedure were encouraged and answered. The patient understands and consents to the procedure. A time-out was performed prior to initiating the procedure. Ultrasound was utilized to confirm patency of the right internal jugular vein. The right neck and chest were prepped with chlorhexidine in a sterile fashion, and a sterile drape was applied covering the operative field. Maximum barrier sterile technique with sterile gowns and gloves were used for the procedure. Local anesthesia was provided with 1% lidocaine. After creating a small venotomy incision, a 21 gauge needle was advanced into the right internal jugular vein under direct, real-time ultrasound guidance. Ultrasound image documentation was performed. After securing guidewire access, an 8 Fr dilator was  placed. A J-wire was kinked to measure appropriate catheter length. A subcutaneous port pocket was then created along the upper chest wall utilizing sharp and blunt dissection. Portable cautery was utilized. The pocket was irrigated with sterile saline. A single lumen power injectable port was chosen for placement. The 8 Fr catheter was tunneled from the port pocket site to the venotomy incision. The port was placed in the pocket. External catheter was trimmed to appropriate length based on guidewire measurement. At the venotomy, an 8 Fr peel-away sheath was placed over a guidewire. The catheter was then placed through the sheath and the sheath removed. Final catheter positioning was confirmed and documented with a fluoroscopic spot image. The port was accessed with a needle and aspirated and flushed with heparinized saline. The access needle was removed. The venotomy and port pocket incisions were closed with subcutaneous 3-0 Monocryl and subcuticular 4-0 Vicryl. Dermabond was applied to both incisions. COMPLICATIONS: COMPLICATIONS None FINDINGS: After catheter placement, the tip lies at the cavo-atrial junction. The catheter aspirates normally and is ready for immediate use. IMPRESSION: Placement of single lumen port a cath via right internal jugular vein. The catheter tip lies at the cavo-atrial junction. A power injectable port a cath was placed and is ready for immediate use. Electronically Signed   By: Aletta Edouard M.D.   On: 02/16/2016 15:28    Discharge Laboratory Values: Lab Results  Component Value Date   WBC 6.7 03/12/2016   HGB 10.7 (L) 03/12/2016   HCT 31.3 (L) 03/12/2016   MCV 86.0 03/12/2016   PLT 251 03/12/2016   Lab Results  Component Value Date   NA 136 03/12/2016   K 4.0 03/12/2016   CL 100 (L) 03/12/2016   CO2  30 03/12/2016    Brief H and P: For complete details please refer to admission H and P, but in brief, The patient was admitted to the hospital on 03/08/2016 for  cycle 2 of chemotherapy. He tolerated treatment well without any major complications or side effects of treatment.  Physical Exam at Discharge: BP 136/71 (BP Location: Right Arm)   Pulse 65   Temp 97.5 F (36.4 C) (Oral)   Resp 20   Ht 5\' 11"  (1.803 m)   Wt 185 lb 8 oz (84.1 kg)   SpO2 99%   BMI 25.87 kg/m  GENERAL:alert, no distress and comfortable SKIN: skin color, texture, turgor are normal, no rashes or significant lesions EYES: normal, Conjunctiva are pink and non-injected, sclera clear OROPHARYNX:no exudate, no erythema and lips, buccal mucosa, and tongue normal  NECK: supple, thyroid normal size, non-tender, without nodularity LYMPH:  no palpable lymphadenopathy in the cervical, axillary or inguinal LUNGS: clear to auscultation and percussion with normal breathing effort HEART: regular rate & rhythm and no murmurs and no lower extremity edema ABDOMEN:abdomen soft, non-tender and normal bowel sounds Musculoskeletal:no cyanosis of digits and no clubbing  NEURO: alert & oriented x 3 with fluent speech, no focal motor/sensory deficits  Hospital Course:  Active Problems:   Diffuse large B cell lymphoma ([facility])   Diet:  Regular  Activity:  As tolerated  Condition at Discharge:   stable  Signed: Dr. Heath Lark [phone]  03/12/2016, 8:35 AM

## 2016-03-12 NOTE — Care Management Important Message (Signed)
Important Message  Patient Details  Name: KONNER BILSKI MRN: [id number] Date of Birth: October 24, 1945   Medicare Important Message Given:  Yes    Camillo Flaming 03/12/2016, 11:43 AMImportant Message  Patient Details  Name: MATHEU CAGNINA MRN: [id number] Date of Birth: 20-Dec-1945   Medicare Important Message Given:  Yes    Camillo Flaming 03/12/2016, 11:41 AM

## 2016-03-12 NOTE — Progress Notes (Signed)
Cytoxan dose and dilution verified with second chemo competent RN Reyne Dumas.

## 2016-03-14 ENCOUNTER — Telehealth: Payer: Self-pay | Admitting: *Deleted

## 2016-03-14 NOTE — Telephone Encounter (Signed)
Per LOS and staff message I have scheduled some appts and called the patient

## 2016-03-15 ENCOUNTER — Ambulatory Visit (HOSPITAL_BASED_OUTPATIENT_CLINIC_OR_DEPARTMENT_OTHER): Payer: Medicare Other

## 2016-03-15 VITALS — BP 125/64 | HR 90 | Temp 98.3°F | Resp 18

## 2016-03-15 DIAGNOSIS — C8330 Diffuse large B-cell lymphoma, unspecified site: Secondary | ICD-10-CM | POA: Diagnosis present

## 2016-03-15 DIAGNOSIS — C8338 Diffuse large B-cell lymphoma, lymph nodes of multiple sites: Secondary | ICD-10-CM

## 2016-03-15 MED ORDER — PEGFILGRASTIM INJECTION 6 MG/0.6ML ~~LOC~~
6.0000 mg | PREFILLED_SYRINGE | Freq: Once | SUBCUTANEOUS | Status: AC
  Administered 2016-03-15: 6 mg via SUBCUTANEOUS
  Filled 2016-03-15: qty 0.6

## 2016-03-15 NOTE — Patient Instructions (Signed)
Pegfilgrastim injection What is this medicine? PEGFILGRASTIM (PEG fil gra stim) is a long-acting granulocyte colony-stimulating factor that stimulates the growth of neutrophils, a type of white blood cell important in the body's fight against infection. It is used to reduce the incidence of fever and infection in patients with certain types of cancer who are receiving chemotherapy that affects the bone marrow, and to increase survival after being exposed to high doses of radiation. This medicine may be used for other purposes; ask your health care provider or pharmacist if you have questions. COMMON BRAND NAME(S): Neulasta What should I tell my health care provider before I take this medicine? They need to know if you have any of these conditions: -kidney disease -latex allergy -ongoing radiation therapy -sickle cell disease -skin reactions to acrylic adhesives (On-Body Injector only) -an unusual or allergic reaction to pegfilgrastim, filgrastim, other medicines, foods, dyes, or preservatives -pregnant or trying to get pregnant -breast-feeding How should I use this medicine? This medicine is for injection under the skin. If you get this medicine at home, you will be taught how to prepare and give the pre-filled syringe or how to use the On-body Injector. Refer to the patient Instructions for Use for detailed instructions. Use exactly as directed. Take your medicine at regular intervals. Do not take your medicine more often than directed. It is important that you put your used needles and syringes in a special sharps container. Do not put them in a trash can. If you do not have a sharps container, call your pharmacist or healthcare provider to get one. Talk to your pediatrician regarding the use of this medicine in children. While this drug may be prescribed for selected conditions, precautions do apply. Overdosage: If you think you have taken too much of this medicine contact a poison control  center or emergency room at once. NOTE: This medicine is only for you. Do not share this medicine with others. What if I miss a dose? It is important not to miss your dose. Call your doctor or health care professional if you miss your dose. If you miss a dose due to an On-body Injector failure or leakage, a new dose should be administered as soon as possible using a single prefilled syringe for manual use. What may interact with this medicine? Interactions have not been studied. Give your health care provider a list of all the medicines, herbs, non-prescription drugs, or dietary supplements you use. Also tell them if you smoke, drink alcohol, or use illegal drugs. Some items may interact with your medicine. This list may not describe all possible interactions. Give your health care provider a list of all the medicines, herbs, non-prescription drugs, or dietary supplements you use. Also tell them if you smoke, drink alcohol, or use illegal drugs. Some items may interact with your medicine. What should I watch for while using this medicine? You may need blood work done while you are taking this medicine. If you are going to need a MRI, CT scan, or other procedure, tell your doctor that you are using this medicine (On-Body Injector only). What side effects may I notice from receiving this medicine? Side effects that you should report to your doctor or health care professional as soon as possible: -allergic reactions like skin rash, itching or hives, swelling of the face, lips, or tongue -dizziness -fever -pain, redness, or irritation at site where injected -pinpoint red spots on the skin -red or dark-brown urine -shortness of breath or breathing problems -stomach or   side pain, or pain at the shoulder -swelling -tiredness -trouble passing urine or change in the amount of urine Side effects that usually do not require medical attention (report to your doctor or health care professional if they  continue or are bothersome): -bone pain -muscle pain This list may not describe all possible side effects. Call your doctor for medical advice about side effects. You may report side effects to FDA at 1-800-FDA-1088. Where should I keep my medicine? Keep out of the reach of children. Store pre-filled syringes in a refrigerator between 2 and 8 degrees C (36 and 46 degrees F). Do not freeze. Keep in carton to protect from light. Throw away this medicine if it is left out of the refrigerator for more than 48 hours. Throw away any unused medicine after the expiration date. NOTE: This sheet is a summary. It may not cover all possible information. If you have questions about this medicine, talk to your doctor, pharmacist, or health care provider.  2017 Elsevier/Gold Standard (2014-04-08 14:30:14)  

## 2016-03-19 ENCOUNTER — Ambulatory Visit (HOSPITAL_BASED_OUTPATIENT_CLINIC_OR_DEPARTMENT_OTHER): Payer: Medicare Other

## 2016-03-19 ENCOUNTER — Other Ambulatory Visit (HOSPITAL_BASED_OUTPATIENT_CLINIC_OR_DEPARTMENT_OTHER): Payer: Medicare Other

## 2016-03-19 DIAGNOSIS — Z95828 Presence of other vascular implants and grafts: Secondary | ICD-10-CM | POA: Insufficient documentation

## 2016-03-19 DIAGNOSIS — C8330 Diffuse large B-cell lymphoma, unspecified site: Secondary | ICD-10-CM

## 2016-03-19 DIAGNOSIS — C8338 Diffuse large B-cell lymphoma, lymph nodes of multiple sites: Secondary | ICD-10-CM

## 2016-03-19 DIAGNOSIS — C8510 Unspecified B-cell lymphoma, unspecified site: Secondary | ICD-10-CM

## 2016-03-19 LAB — COMPREHENSIVE METABOLIC PANEL
ALT: 13 U/L (ref 0–55)
AST: 9 U/L (ref 5–34)
Albumin: 3.1 g/dL — ABNORMAL LOW (ref 3.5–5.0)
Alkaline Phosphatase: 64 U/L (ref 40–150)
Anion Gap: 9 mEq/L (ref 3–11)
BUN: 16.4 mg/dL (ref 7.0–26.0)
CHLORIDE: 98 meq/L (ref 98–109)
CO2: 28 meq/L (ref 22–29)
CREATININE: 0.7 mg/dL (ref 0.7–1.3)
Calcium: 9.4 mg/dL (ref 8.4–10.4)
EGFR: >90 mL/min/{1.73_m2} (ref >90)
GLUCOSE: 124 mg/dL (ref 70–140)
POTASSIUM: 4.2 meq/L (ref 3.5–5.1)
SODIUM: 135 meq/L — AB (ref 136–145)
Total Bilirubin: 0.43 mg/dL (ref 0.20–1.20)
Total Protein: 6.1 g/dL — ABNORMAL LOW (ref 6.4–8.3)

## 2016-03-19 LAB — CBC WITH DIFFERENTIAL/PLATELET
BASO%: 0.5 % (ref 0.0–2.0)
Basophils Absolute: 0.1 10*3/uL (ref 0.0–0.1)
EOS%: 0 % (ref 0.0–7.0)
Eosinophils Absolute: 0 10*3/uL (ref 0.0–0.5)
HCT: 30.5 % — ABNORMAL LOW (ref 38.4–49.9)
HGB: 10.4 g/dL — ABNORMAL LOW (ref 13.0–17.1)
LYMPH#: 0.4 10*3/uL — AB (ref 0.9–3.3)
LYMPH%: 48.1 % (ref 14.0–49.0)
MCH: 28.7 pg (ref 27.2–33.4)
MCHC: 34.1 g/dL (ref 32.0–36.0)
MCV: 84.3 fL (ref 79.3–98.0)
MONO#: 0.2 10*3/uL (ref 0.1–0.9)
MONO%: 21 % — ABNORMAL HIGH (ref 0.0–14.0)
NEUT%: 30.4 % — AB (ref 39.0–75.0)
NEUTROS ABS: 0.2 10*3/uL — AB (ref 1.5–6.5)
Platelets: 81 10*3/uL — ABNORMAL LOW (ref 140–400)
RBC: 3.62 10*6/uL — ABNORMAL LOW (ref 4.20–5.82)
RDW: 15.1 % — ABNORMAL HIGH (ref 11.0–14.6)
WBC: 0.8 10*3/uL — AB (ref 4.0–10.3)
nRBC: 0 % (ref 0–0)

## 2016-03-19 MED ORDER — SODIUM CHLORIDE 0.9% FLUSH
10.0000 mL | INTRAVENOUS | Status: DC | PRN
  Administered 2016-03-19: 10 mL via INTRAVENOUS
  Filled 2016-03-19: qty 10

## 2016-03-19 MED ORDER — HEPARIN SOD (PORK) LOCK FLUSH 100 UNIT/ML IV SOLN
500.0000 [IU] | INTRAVENOUS | Status: DC | PRN
  Administered 2016-03-19: 500 [IU] via INTRAVENOUS
  Filled 2016-03-19: qty 5

## 2016-03-20 ENCOUNTER — Telehealth: Payer: Self-pay

## 2016-03-20 NOTE — Telephone Encounter (Signed)
Pt called with new onset back pain started last night, lower back, sharp pain, lasts for maybe 60 seconds then ease off. Then come back. Just laying on bed and it will come back. No fever, no bowel or bladder problems. 7/10 pain when it's there. Sees Dr Alvy Bimler 12/21. (was saying tomorrow)  Has not tried anything to relieve the pain.

## 2016-03-20 NOTE — Telephone Encounter (Signed)
It could be related to bone marrow recovery and pain from GCSF injection Recommend Tylenol 1000 mg q 6 hours PO for next 2 days and ibuprofen 600 mg in between doses of tylenol if not better

## 2016-03-20 NOTE — Telephone Encounter (Signed)
Notified of Dr Gorsuch message below. Verbalized understanding 

## 2016-03-22 ENCOUNTER — Other Ambulatory Visit (HOSPITAL_BASED_OUTPATIENT_CLINIC_OR_DEPARTMENT_OTHER): Payer: Medicare Other

## 2016-03-22 ENCOUNTER — Ambulatory Visit (HOSPITAL_BASED_OUTPATIENT_CLINIC_OR_DEPARTMENT_OTHER): Payer: Medicare Other | Admitting: Hematology and Oncology

## 2016-03-22 ENCOUNTER — Ambulatory Visit: Payer: Medicare Other

## 2016-03-22 DIAGNOSIS — C8338 Diffuse large B-cell lymphoma, lymph nodes of multiple sites: Secondary | ICD-10-CM

## 2016-03-22 DIAGNOSIS — D61818 Other pancytopenia: Secondary | ICD-10-CM | POA: Diagnosis not present

## 2016-03-22 DIAGNOSIS — E440 Moderate protein-calorie malnutrition: Secondary | ICD-10-CM | POA: Diagnosis not present

## 2016-03-22 DIAGNOSIS — C8510 Unspecified B-cell lymphoma, unspecified site: Secondary | ICD-10-CM

## 2016-03-22 LAB — CBC WITH DIFFERENTIAL/PLATELET
BASO%: 0.6 % (ref 0.0–2.0)
BASOS ABS: 0.1 10*3/uL (ref 0.0–0.1)
EOS%: 0.1 % (ref 0.0–7.0)
Eosinophils Absolute: 0 10*3/uL (ref 0.0–0.5)
HEMATOCRIT: 32.4 % — AB (ref 38.4–49.9)
HEMOGLOBIN: 10.6 g/dL — AB (ref 13.0–17.1)
LYMPH#: 1.5 10*3/uL (ref 0.9–3.3)
LYMPH%: 7.1 % — ABNORMAL LOW (ref 14.0–49.0)
MCH: 28.1 pg (ref 27.2–33.4)
MCHC: 32.7 g/dL (ref 32.0–36.0)
MCV: 86.2 fL (ref 79.3–98.0)
MONO#: 0.6 10*3/uL (ref 0.1–0.9)
MONO%: 2.7 % (ref 0.0–14.0)
NEUT#: 18.9 10*3/uL — ABNORMAL HIGH (ref 1.5–6.5)
NEUT%: 89.5 % — ABNORMAL HIGH (ref 39.0–75.0)
NRBC: 1 % — AB (ref 0–0)
Platelets: 140 10*3/uL (ref 140–400)
RBC: 3.76 10*6/uL — ABNORMAL LOW (ref 4.20–5.82)
RDW: 16.1 % — AB (ref 11.0–14.6)
WBC: 21.1 10*3/uL — ABNORMAL HIGH (ref 4.0–10.3)

## 2016-03-22 LAB — COMPREHENSIVE METABOLIC PANEL
ALBUMIN: 3.5 g/dL (ref 3.5–5.0)
ALK PHOS: 76 U/L (ref 40–150)
ALT: 15 U/L (ref 0–55)
AST: 19 U/L (ref 5–34)
Anion Gap: 11 mEq/L (ref 3–11)
BUN: 13 mg/dL (ref 7.0–26.0)
CALCIUM: 9.6 mg/dL (ref 8.4–10.4)
CO2: 25 mEq/L (ref 22–29)
Chloride: 100 mEq/L (ref 98–109)
Creatinine: 0.8 mg/dL (ref 0.7–1.3)
EGFR: >90 mL/min/{1.73_m2} (ref >90)
GLUCOSE: 119 mg/dL (ref 70–140)
Potassium: 4.9 mEq/L (ref 3.5–5.1)
SODIUM: 135 meq/L — AB (ref 136–145)
TOTAL PROTEIN: 6.6 g/dL (ref 6.4–8.3)

## 2016-03-22 NOTE — Patient Instructions (Signed)

## 2016-03-23 ENCOUNTER — Encounter: Payer: Self-pay | Admitting: Hematology and Oncology

## 2016-03-23 NOTE — Progress Notes (Signed)
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 Stable ectasia or post stenotic dilatation of the right external iliac artery measuring up to 1.5 cm. Extensive atherosclerotic disease involving the abdominal aorta with a chronic intimal flap or short segment dissection in the infrarenal abdominal aorta. Chronic occlusion of the left common iliac artery. Slowly enlarging retroperitoneal lymph nodes as described. An indolent neoplastic or lymphoproliferative process cannot be excluded      01/06/2016 Imaging    Ct abdomen and pelvis showed new extensive centrally necrotic retroperitoneal and bilateral pelvic lymphadenopathy. Differential includes lymphoproliferative condition (lymphoma/leukemia) or infectious lymphadenitis (such as due to bacterial, viral or mycobacterial etiologies). Squamous cell carcinoma metastases can have a similar appearance, although this is less likely given the location. Clinical and laboratory evaluation for a lymphoproliferative condition and tissue sampling is advised. 2. Aortic atherosclerosis. Stable 3.1 cm infrarenal abdominal aortic aneurysm      01/27/2016 Imaging    CH chest showed no acute cardiopulmonary disease. Known stable retrocrural and periaortic adenopathy. Oval density over the left neck base measuring 2.5 x 4.5 cm likely due partially to prominent internal jugular vein, although cannot exclude adenopathy in this region. Recommend neck CT with contrast. Three  vessel atherosclerotic coronary artery disease. Few small thyroid nodules with the largest measuring 1.2 cm over the left lobe. 2.4 cm left renal cyst. Sub cm right renal hypodensity unchanged and too small to characterize but likely a cyst. Minimal cholelithiasis.      01/31/2016 Imaging    ECHO showed: Left ventricle: The cavity size was normal. Systolic function was normal. The estimated ejection fraction was in the range of 60% to 65%. Wall motion was normal; there were no regional wall motion abnormalities. There was an increased relative contribution of atrial contraction to ventricular filling. Doppler parameters are consistent with abnormal left ventricular relaxation (grade 1 diastolic dysfunction).      02/07/2016 Procedure    He had imaging guided biopsy of supraclavicular lymph node      02/07/2016 Pathology Results    Accession: [id number] Core biopsies reveal diffuse areas of large atypical lymphoid cells. There are multiple cells with very large irregular nuclei or multinucleated cells. Immunohistochemistry reveals the cells are positive for CD20, CD45, bcl-2, and bcl-6. They are negative for CD10, CD30, and ALK. CD3, CD4, CD5 and CD8 reveal scattered T-cells. Ki-67 reveals an elevated proliferation rate (95%). The cells are negative for cytokeratin 7, cytokeratin 20, TTF-1, CDX-2, HMB45, SOX11, and MelanA. Overall, these findings are consistent with a diffuse large B-cell lymphoma      02/12/2016 - 02/21/2016 Hospital Admission    He was admitted to the hospital with sepsis. He received cycle 1 of chemotherapy will hospitalized      02/14/2016 Imaging    Ct abdomen and pelvis showed widespread retrocrural, retroperitoneal, and bilateral pelvic lymphadenopathy, waxing/waning when compared to recent CT, as above. Mild right hydronephrosis, likely secondary to extrinsic compression by right retroperitoneal lymphadenopathy, with  associated diminished enhancement of the right kidney.  Spleen is normal in size.      02/16/2016 Bone Marrow Biopsy    Bone marrow biopsy was negative for lymphoma involvement. Cytogenetics came back abnormal: -Y in 45% of cell lines      02/16/2016 Procedure    He had port placement      02/16/2016 - 02/21/2016 Chemotherapy    He received cycle 1 of R-EPOCH       03/08/2016 - 03/12/2016 Hospital Admission    The patient received cycle 2 of chemotherapy in the hospital       INTERVAL HISTORY: Please see below for problem oriented charting. He returns today for some discussion about further chemotherapy. He feels well. No recent infection. Appetite is improving. He has no further weight loss. Denies peripheral neuropathy.  REVIEW OF SYSTEMS:   Constitutional: Denies fevers, chills or abnormal weight loss Eyes: Denies blurriness of vision Ears, nose, mouth, throat, and face: Denies mucositis or sore throat Respiratory: Denies cough, dyspnea or wheezes Cardiovascular: Denies palpitation, chest discomfort or lower extremity swelling Gastrointestinal:  Denies nausea, heartburn or change in bowel habits Skin: Denies abnormal skin rashes Lymphatics: Denies new lymphadenopathy or easy bruising Neurological:Denies numbness, tingling or new weaknesses Behavioral/Psych: Mood is stable, no new changes  All other systems were reviewed with the patient and are negative.  I have reviewed the past medical history, past surgical history, social history and family history with the patient and they are unchanged from previous note.  ALLERGIES:  is allergic to bee venom; metformin and related; and sulfa drugs cross reactors.  MEDICATIONS:  Current Outpatient Prescriptions  Medication Sig Dispense Refill  . amLODipine (NORVASC) 10 MG tablet Take 10 mg by mouth daily.    Marland Kitchen atorvastatin (LIPITOR) 40 MG tablet Take 40 mg by mouth daily at 6 PM.    . clopidogrel (PLAVIX) 75 MG tablet Take 75 mg by mouth daily with breakfast.    . lactulose  (CHRONULAC) 10 GM/15ML solution Take 15 mLs (10 g total) by mouth 3 (three) times daily. 240 mL 0  . lidocaine-prilocaine (EMLA) cream Apply 1 application topically as needed. (Patient taking differently: Apply 1 application topically daily as needed (port access). ) 30 g 6  . lisinopril (PRINIVIL,ZESTRIL) 5 MG tablet Take 5 mg by mouth daily.    . ondansetron (ZOFRAN) 8 MG tablet Take 1 tablet (8 mg total) by mouth every 8 (eight) hours as needed for nausea or vomiting. 60 tablet 6  . pioglitazone (ACTOS) 30 MG tablet Take 30 mg by mouth daily.    . prochlorperazine (COMPAZINE) 10 MG tablet Take 1 tablet (10 mg total) by mouth every 6 (six) hours as needed. 60 tablet 3  . sennosides-docusate sodium (SENOKOT-S) 8.6-50 MG tablet Take 2 tablets by mouth daily.     No current facility-administered medications for this visit.     PHYSICAL EXAMINATION: ECOG PERFORMANCE STATUS: 1 - Symptomatic but completely ambulatory  Vitals:   03/22/16 1228  BP: (!) 109/56  Pulse: 100  Resp: 18  Temp: 97.9 F (36.6 C)   Filed Weights   03/22/16 1228  Weight: 190 lb 8 oz (86.4 kg)    GENERAL:alert, no distress and comfortable SKIN: skin color, texture, turgor are normal, no rashes or significant lesions EYES: normal, Conjunctiva are pink and non-injected, sclera clear OROPHARYNX:no exudate, no erythema and lips, buccal mucosa, and tongue normal  NECK: supple, thyroid normal size, non-tender, without nodularity LYMPH:  no palpable lymphadenopathy  in the cervical, axillary or inguinal LUNGS: clear to auscultation and percussion with normal breathing effort HEART: regular rate & rhythm and no murmurs and no lower extremity edema ABDOMEN:abdomen soft, non-tender and normal bowel sounds Musculoskeletal:no cyanosis of digits and no clubbing  NEURO: alert & oriented x 3 with fluent speech, no focal motor/sensory deficits  LABORATORY DATA:  I have reviewed the data as listed    Component Value Date/Time    NA 135 (L) 03/22/2016 1155   K 4.9 03/22/2016 1155   CL 100 (L) 03/12/2016 0420   CO2 25 03/22/2016 1155   GLUCOSE 119 03/22/2016 1155   BUN 13.0 03/22/2016 1155   CREATININE 0.8 03/22/2016 1155   CALCIUM 9.6 03/22/2016 1155   PROT 6.6 03/22/2016 1155   ALBUMIN 3.5 03/22/2016 1155   AST 19 03/22/2016 1155   ALT 15 03/22/2016 1155   ALKPHOS 76 03/22/2016 1155   BILITOT <0.22 03/22/2016 1155   GFRNONAA >60 03/12/2016 0420   GFRAA >60 03/12/2016 0420    No results found for: SPEP, UPEP  Lab Results  Component Value Date   WBC 21.1 (H) 03/22/2016   NEUTROABS 18.9 (H) 03/22/2016   HGB 10.6 (L) 03/22/2016   HCT 32.4 (L) 03/22/2016   MCV 86.2 03/22/2016   PLT 140 03/22/2016      Chemistry      Component Value Date/Time   NA 135 (L) 03/22/2016 1155   K 4.9 03/22/2016 1155   CL 100 (L) 03/12/2016 0420   CO2 25 03/22/2016 1155   BUN 13.0 03/22/2016 1155   CREATININE 0.8 03/22/2016 1155      Component Value Date/Time   CALCIUM 9.6 03/22/2016 1155   ALKPHOS 76 03/22/2016 1155   AST 19 03/22/2016 1155   ALT 15 03/22/2016 1155   BILITOT <0.22 03/22/2016 1155      ASSESSMENT & PLAN:  Diffuse large B-cell lymphoma of lymph nodes of multiple regions ([place]) Clinically, the patient has excellent response to treatment with minimal side-effects We spent a lot of time today reviewing FISH analysis on his biopsy and as well as consideration for CNS prophylaxis. Overall, after discussion with pathologist, the patient does not has signs of double hit lymphoma. The patient may benefit from CNS prophylaxis which I plan to order in conjunction with cycle 3 of treatment, to be done around January 3rd, 2018 with plan for 4 cycles of treatment. The patient will get had CT scan done next week and will return again on December 28 for final blood work. I plan to admit him to the hospital on 04/03/2016 for cycle 3 of therapy  Protein-calorie malnutrition, moderate ([place]) He has lost some  weight. However, his appetite is preserved. Recommend he continue nutritional intake as tolerated  Pancytopenia, acquired ([place]) This is related to recent treatment. He does not require blood transfusion. He will get 1 unit of blood if hemoglobin is less than 8 and 1 unit of platelets if he has bleeding or platelet count less than 10,000. He needs irradiated blood products.   No orders of the defined types were placed in this encounter.  All questions were answered. The patient knows to call the clinic with any problems, questions or concerns. No barriers to learning was detected. I spent 30 minutes counseling the patient face to face. The total time spent in the appointment was 55 minutes and more than 50% was on counseling and review of test results     Heath Lark, MD 03/23/2016 9:43 AM

## 2016-03-23 NOTE — Assessment & Plan Note (Addendum)
This is related to recent treatment. He does not require blood transfusion. He will get 1 unit of blood if hemoglobin is less than 8 and 1 unit of platelets if he has bleeding or platelet count less than 10,000. He needs irradiated blood products.

## 2016-03-23 NOTE — Assessment & Plan Note (Signed)
Clinically, the patient has excellent response to treatment with minimal side-effects We spent a lot of time today reviewing FISH analysis on his biopsy and as well as consideration for CNS prophylaxis. Overall, after discussion with pathologist, the patient does not has signs of double hit lymphoma. The patient may benefit from CNS prophylaxis which I plan to order in conjunction with cycle 3 of treatment, to be done around January 3rd, 2018 with plan for 4 cycles of treatment. The patient will get had CT scan done next week and will return again on December 28 for final blood work. I plan to admit him to the hospital on 04/03/2016 for cycle 3 of therapy

## 2016-03-23 NOTE — Assessment & Plan Note (Signed)
He has lost some weight. However, his appetite is preserved. Recommend he continue nutritional intake as tolerated

## 2016-03-27 ENCOUNTER — Encounter (HOSPITAL_COMMUNITY)
Admission: RE | Admit: 2016-03-27 | Discharge: 2016-03-27 | Disposition: A | Discharge status: Home / Self Care | Payer: Medicare Other | Source: Ambulatory Visit | Attending: Hematology and Oncology | Admitting: Hematology and Oncology

## 2016-03-27 ENCOUNTER — Telehealth: Payer: Self-pay | Admitting: *Deleted

## 2016-03-27 DIAGNOSIS — C8338 Diffuse large B-cell lymphoma, lymph nodes of multiple sites: Secondary | ICD-10-CM | POA: Diagnosis not present

## 2016-03-27 DIAGNOSIS — C8330 Diffuse large B-cell lymphoma, unspecified site: Secondary | ICD-10-CM | POA: Diagnosis not present

## 2016-03-27 LAB — GLUCOSE, CAPILLARY: Glucose-Capillary: 123 mg/dL — ABNORMAL HIGH (ref 65–99)

## 2016-03-27 MED ORDER — FLUDEOXYGLUCOSE F - 18 (FDG) INJECTION
9.1000 | Freq: Once | INTRAVENOUS | Status: AC | PRN
  Administered 2016-03-27: 9.1 via INTRAVENOUS

## 2016-03-27 NOTE — Telephone Encounter (Signed)
-----   Message from Heath Lark, MD sent at 03/27/2016 11:20 AM EST ----- Regarding: PET scan Pls call him and let him know PET scan showed near complete response I will review the imaging with him next week when he gets admitted to the hospital ----- Message ----- From: Interface, Rad Results In Sent: 03/27/2016  11:10 AM To: Heath Lark, MD

## 2016-03-27 NOTE — Telephone Encounter (Signed)
"  I was exposed to the Flu and need to know what they want me to do.  The genleman that has the flu spent all day yesterday in my home.  However, I feel great today.  Return number [phone]."

## 2016-03-27 NOTE — Telephone Encounter (Signed)
Informed pt of Dr. Calton Dach message regarding PET scan and he verbalized understanding/ appreciative of the call.

## 2016-03-27 NOTE — Telephone Encounter (Signed)
Informed pt of Dr. Calton Dach reply.  See previous phone note from today.: " Supportive care only, rest at home, plenty of fluids He has appt to return for labs as scheduled on 12/29. If not better, please let me know and I will see him on 12/29 " Pt verbalized understanding.

## 2016-03-27 NOTE — Telephone Encounter (Signed)
Supportive care only, rest at home, plenty of fluids He has appt to return for labs as scheduled on 12/29. If not better, please let me know and I will see him on 12/29

## 2016-03-28 ENCOUNTER — Telehealth: Payer: Self-pay | Admitting: Hematology and Oncology

## 2016-03-28 NOTE — Telephone Encounter (Signed)
Collaborative nurse called patient.  See phone note.

## 2016-03-28 NOTE — Telephone Encounter (Signed)
LEFT MESSAGE CONFIRMING 12/28 APPOINTMENT - PATIENT TO GET NEW SCHEDULE 12/28.

## 2016-03-29 ENCOUNTER — Telehealth: Payer: Self-pay | Admitting: Hematology and Oncology

## 2016-03-29 ENCOUNTER — Other Ambulatory Visit (HOSPITAL_BASED_OUTPATIENT_CLINIC_OR_DEPARTMENT_OTHER): Payer: Medicare Other

## 2016-03-29 DIAGNOSIS — C8338 Diffuse large B-cell lymphoma, lymph nodes of multiple sites: Secondary | ICD-10-CM

## 2016-03-29 DIAGNOSIS — C8510 Unspecified B-cell lymphoma, unspecified site: Secondary | ICD-10-CM

## 2016-03-29 LAB — CBC WITH DIFFERENTIAL/PLATELET
BASO%: 0.8 % (ref 0.0–2.0)
Basophils Absolute: 0.1 10*3/uL (ref 0.0–0.1)
EOS ABS: 0.1 10*3/uL (ref 0.0–0.5)
EOS%: 0.4 % (ref 0.0–7.0)
HCT: 32.5 % — ABNORMAL LOW (ref 38.4–49.9)
HGB: 10.7 g/dL — ABNORMAL LOW (ref 13.0–17.1)
LYMPH%: 8.9 % — AB (ref 14.0–49.0)
MCH: 29.3 pg (ref 27.2–33.4)
MCHC: 32.9 g/dL (ref 32.0–36.0)
MCV: 89 fL (ref 79.3–98.0)
MONO#: 2.4 10*3/uL — ABNORMAL HIGH (ref 0.1–0.9)
MONO%: 17.4 % — AB (ref 0.0–14.0)
NEUT%: 72.5 % (ref 39.0–75.0)
NEUTROS ABS: 10 10*3/uL — AB (ref 1.5–6.5)
Platelets: 232 10*3/uL (ref 140–400)
RBC: 3.65 10*6/uL — AB (ref 4.20–5.82)
RDW: 18.4 % — ABNORMAL HIGH (ref 11.0–14.6)
WBC: 13.7 10*3/uL — AB (ref 4.0–10.3)
lymph#: 1.2 10*3/uL (ref 0.9–3.3)

## 2016-03-29 LAB — COMPREHENSIVE METABOLIC PANEL
ALT: 13 U/L (ref 0–55)
AST: 13 U/L (ref 5–34)
Albumin: 3.2 g/dL — ABNORMAL LOW (ref 3.5–5.0)
Alkaline Phosphatase: 72 U/L (ref 40–150)
Anion Gap: 10 mEq/L (ref 3–11)
BUN: 12.5 mg/dL (ref 7.0–26.0)
CO2: 28 meq/L (ref 22–29)
Calcium: 9.5 mg/dL (ref 8.4–10.4)
Chloride: 101 mEq/L (ref 98–109)
Creatinine: 0.8 mg/dL (ref 0.7–1.3)
GLUCOSE: 159 mg/dL — AB (ref 70–140)
Potassium: 4.5 mEq/L (ref 3.5–5.1)
SODIUM: 139 meq/L (ref 136–145)
TOTAL PROTEIN: 6.4 g/dL (ref 6.4–8.3)

## 2016-03-29 NOTE — Telephone Encounter (Signed)
Patient came by scheduling to have AVS report and calendars printed out for him.

## 2016-04-03 ENCOUNTER — Ambulatory Visit (HOSPITAL_COMMUNITY)
Admission: RE | Admit: 2016-04-03 | Discharge: 2016-04-03 | Disposition: A | Discharge status: Home / Self Care | Payer: Medicare Other | Source: Ambulatory Visit | Attending: Hematology and Oncology | Admitting: Hematology and Oncology

## 2016-04-03 ENCOUNTER — Ambulatory Visit: Payer: Medicare Other | Admitting: Hematology and Oncology

## 2016-04-03 ENCOUNTER — Encounter (HOSPITAL_COMMUNITY): Payer: Self-pay

## 2016-04-03 ENCOUNTER — Inpatient Hospital Stay (HOSPITAL_COMMUNITY)
Admission: AD | Admit: 2016-04-03 | Discharge: 2016-04-07 | DRG: 847 | Disposition: A | Discharge status: Home / Self Care | Payer: Medicare Other | Source: Ambulatory Visit | Attending: Hematology and Oncology | Admitting: Hematology and Oncology

## 2016-04-03 DIAGNOSIS — E441 Mild protein-calorie malnutrition: Secondary | ICD-10-CM | POA: Diagnosis present

## 2016-04-03 DIAGNOSIS — E785 Hyperlipidemia, unspecified: Secondary | ICD-10-CM | POA: Diagnosis present

## 2016-04-03 DIAGNOSIS — K5900 Constipation, unspecified: Secondary | ICD-10-CM | POA: Diagnosis present

## 2016-04-03 DIAGNOSIS — Z6826 Body mass index (BMI) 26.0-26.9, adult: Secondary | ICD-10-CM

## 2016-04-03 DIAGNOSIS — K219 Gastro-esophageal reflux disease without esophagitis: Secondary | ICD-10-CM | POA: Diagnosis present

## 2016-04-03 DIAGNOSIS — Z8673 Personal history of transient ischemic attack (TIA), and cerebral infarction without residual deficits: Secondary | ICD-10-CM

## 2016-04-03 DIAGNOSIS — Z833 Family history of diabetes mellitus: Secondary | ICD-10-CM

## 2016-04-03 DIAGNOSIS — C8338 Diffuse large B-cell lymphoma, lymph nodes of multiple sites: Secondary | ICD-10-CM | POA: Diagnosis not present

## 2016-04-03 DIAGNOSIS — I10 Essential (primary) hypertension: Secondary | ICD-10-CM | POA: Diagnosis present

## 2016-04-03 DIAGNOSIS — C8330 Diffuse large B-cell lymphoma, unspecified site: Secondary | ICD-10-CM | POA: Diagnosis present

## 2016-04-03 DIAGNOSIS — I959 Hypotension, unspecified: Secondary | ICD-10-CM | POA: Diagnosis present

## 2016-04-03 DIAGNOSIS — Z5111 Encounter for antineoplastic chemotherapy: Principal | ICD-10-CM

## 2016-04-03 DIAGNOSIS — Z8572 Personal history of non-Hodgkin lymphomas: Secondary | ICD-10-CM | POA: Diagnosis present

## 2016-04-03 DIAGNOSIS — E46 Unspecified protein-calorie malnutrition: Secondary | ICD-10-CM

## 2016-04-03 DIAGNOSIS — Z801 Family history of malignant neoplasm of trachea, bronchus and lung: Secondary | ICD-10-CM | POA: Diagnosis not present

## 2016-04-03 DIAGNOSIS — E119 Type 2 diabetes mellitus without complications: Secondary | ICD-10-CM | POA: Diagnosis present

## 2016-04-03 DIAGNOSIS — K5909 Other constipation: Secondary | ICD-10-CM

## 2016-04-03 MED ORDER — HEPARIN SOD (PORK) LOCK FLUSH 100 UNIT/ML IV SOLN: 250.0000 [IU] | Freq: Once | INTRAVENOUS | Status: DC | PRN

## 2016-04-03 MED ORDER — ALTEPLASE 2 MG IJ SOLR: 2.0000 mg | Freq: Once | INTRAMUSCULAR | Status: DC | PRN

## 2016-04-03 MED ORDER — SODIUM CHLORIDE 0.9 % IV SOLN: 8.0000 mg | Freq: Three times a day (TID) | INTRAVENOUS | Status: DC | PRN

## 2016-04-03 MED ORDER — VINCRISTINE SULFATE CHEMO INJECTION 1 MG/ML
Freq: Once | INTRAVENOUS | Status: AC
  Administered 2016-04-03: 18:00:00 via INTRAVENOUS
  Filled 2016-04-03 (×2): qty 11

## 2016-04-03 MED ORDER — SODIUM CHLORIDE 0.9 % IV SOLN
Freq: Once | INTRAVENOUS | Status: AC
  Administered 2016-04-03: 19:00:00 via INTRAVENOUS

## 2016-04-03 MED ORDER — EPINEPHRINE PF 1 MG/ML IJ SOLN
0.5000 mg | Freq: Once | INTRAMUSCULAR | Status: DC | PRN
Start: 2016-04-03 — End: 2016-04-07

## 2016-04-03 MED ORDER — SODIUM CHLORIDE 0.9 % IV SOLN
Freq: Once | INTRAVENOUS | Status: AC
  Administered 2016-04-03: 18 mg via INTRAVENOUS
  Filled 2016-04-03 (×2): qty 4

## 2016-04-03 MED ORDER — COLD PACK MISC ONCOLOGY
1.0000 | Freq: Once | Status: DC | PRN
  Filled 2016-04-03: qty 1

## 2016-04-03 MED ORDER — ATORVASTATIN CALCIUM 40 MG PO TABS
40.0000 mg | ORAL_TABLET | Freq: Every day | ORAL | Status: DC
  Administered 2016-04-03 – 2016-04-06 (×4): 40 mg via ORAL
  Filled 2016-04-03 (×4): qty 1

## 2016-04-03 MED ORDER — LIDOCAINE-PRILOCAINE 2.5-2.5 % EX CREA: 1.0000 "application " | TOPICAL_CREAM | Freq: Every day | CUTANEOUS | Status: DC | PRN

## 2016-04-03 MED ORDER — DIPHENHYDRAMINE HCL 50 MG/ML IJ SOLN
50.0000 mg | Freq: Once | INTRAMUSCULAR | Status: DC | PRN
Start: 2016-04-03 — End: 2016-04-07

## 2016-04-03 MED ORDER — CLOPIDOGREL BISULFATE 75 MG PO TABS: 75.0000 mg | ORAL_TABLET | Freq: Every day | ORAL | Status: DC

## 2016-04-03 MED ORDER — ONDANSETRON HCL 4 MG/2ML IJ SOLN: 4.0000 mg | Freq: Three times a day (TID) | INTRAMUSCULAR | Status: DC | PRN

## 2016-04-03 MED ORDER — ACETAMINOPHEN 325 MG PO TABS
650.0000 mg | ORAL_TABLET | Freq: Once | ORAL | Status: AC
  Administered 2016-04-03: 650 mg via ORAL
  Filled 2016-04-03: qty 2

## 2016-04-03 MED ORDER — SODIUM CHLORIDE 0.9 % IV SOLN
375.0000 mg/m2 | Freq: Once | INTRAVENOUS | Status: AC
  Administered 2016-04-03: 800 mg via INTRAVENOUS
  Filled 2016-04-03: qty 50

## 2016-04-03 MED ORDER — SODIUM CHLORIDE 0.9 % IV SOLN
INTRAVENOUS | Status: DC
  Administered 2016-04-03: 12:00:00 via INTRAVENOUS

## 2016-04-03 MED ORDER — EPINEPHRINE PF 1 MG/10ML IJ SOSY: 0.2500 mg | PREFILLED_SYRINGE | Freq: Once | INTRAMUSCULAR | Status: DC | PRN

## 2016-04-03 MED ORDER — SODIUM CHLORIDE 0.9% FLUSH: 10.0000 mL | INTRAVENOUS | Status: DC | PRN

## 2016-04-03 MED ORDER — HEPARIN SOD (PORK) LOCK FLUSH 100 UNIT/ML IV SOLN: 500.0000 [IU] | Freq: Once | INTRAVENOUS | Status: DC | PRN

## 2016-04-03 MED ORDER — DIPHENHYDRAMINE HCL 50 MG/ML IJ SOLN: 25.0000 mg | Freq: Once | INTRAMUSCULAR | Status: DC | PRN

## 2016-04-03 MED ORDER — SODIUM CHLORIDE 0.9% FLUSH
3.0000 mL | INTRAVENOUS | Status: DC | PRN
Start: 2016-04-03 — End: 2016-04-07

## 2016-04-03 MED ORDER — SENNA 8.6 MG PO TABS
1.0000 | ORAL_TABLET | Freq: Two times a day (BID) | ORAL | Status: DC
  Administered 2016-04-03: 8.6 mg via ORAL
  Filled 2016-04-03 (×2): qty 1

## 2016-04-03 MED ORDER — METHYLPREDNISOLONE SODIUM SUCC 125 MG IJ SOLR: 125.0000 mg | Freq: Once | INTRAMUSCULAR | Status: DC | PRN

## 2016-04-03 MED ORDER — ALBUTEROL SULFATE (2.5 MG/3ML) 0.083% IN NEBU: 2.5000 mg | INHALATION_SOLUTION | Freq: Once | RESPIRATORY_TRACT | Status: DC | PRN

## 2016-04-03 MED ORDER — SODIUM CHLORIDE 0.9 % IV SOLN: Freq: Once | INTRAVENOUS | Status: DC | PRN

## 2016-04-03 MED ORDER — PROCHLORPERAZINE MALEATE 10 MG PO TABS: 10.0000 mg | ORAL_TABLET | Freq: Four times a day (QID) | ORAL | Status: DC | PRN

## 2016-04-03 MED ORDER — SODIUM CHLORIDE 0.9% FLUSH: 3.0000 mL | INTRAVENOUS | Status: DC | PRN

## 2016-04-03 MED ORDER — AMLODIPINE BESYLATE 10 MG PO TABS
10.0000 mg | ORAL_TABLET | Freq: Every day | ORAL | Status: DC
  Administered 2016-04-04 – 2016-04-07 (×4): 10 mg via ORAL
  Filled 2016-04-03 (×4): qty 1

## 2016-04-03 MED ORDER — ONDANSETRON HCL 4 MG PO TABS: 4.0000 mg | ORAL_TABLET | Freq: Three times a day (TID) | ORAL | Status: DC | PRN

## 2016-04-03 MED ORDER — FAMOTIDINE IN NACL 20-0.9 MG/50ML-% IV SOLN
20.0000 mg | Freq: Once | INTRAVENOUS | Status: DC | PRN
Start: 2016-04-03 — End: 2016-04-07

## 2016-04-03 MED ORDER — ONDANSETRON 4 MG PO TBDP: 4.0000 mg | ORAL_TABLET | Freq: Three times a day (TID) | ORAL | Status: DC | PRN

## 2016-04-03 MED ORDER — HOT PACK MISC ONCOLOGY
1.0000 | Freq: Once | Status: DC | PRN
  Filled 2016-04-03: qty 1

## 2016-04-03 MED ORDER — SODIUM CHLORIDE 0.9 % IV SOLN
INTRAVENOUS | Status: DC
  Administered 2016-04-03 – 2016-04-06 (×4): via INTRAVENOUS

## 2016-04-03 MED ORDER — ACETAMINOPHEN 325 MG PO TABS: 650.0000 mg | ORAL_TABLET | ORAL | Status: DC | PRN

## 2016-04-03 MED ORDER — PIOGLITAZONE HCL 30 MG PO TABS
30.0000 mg | ORAL_TABLET | Freq: Every day | ORAL | Status: DC
Start: 2016-04-04 — End: 2016-04-07
  Administered 2016-04-04 – 2016-04-07 (×4): 30 mg via ORAL
  Filled 2016-04-03 (×4): qty 1

## 2016-04-03 MED ORDER — DIPHENHYDRAMINE HCL 50 MG PO CAPS
50.0000 mg | ORAL_CAPSULE | Freq: Once | ORAL | Status: AC
  Administered 2016-04-03: 50 mg via ORAL
  Filled 2016-04-03: qty 1

## 2016-04-03 MED ORDER — HYDROMORPHONE HCL 1 MG/ML IJ SOLN: 1.0000 mg | INTRAMUSCULAR | Status: DC | PRN

## 2016-04-03 MED ORDER — HEPARIN SOD (PORK) LOCK FLUSH 100 UNIT/ML IV SOLN
250.0000 [IU] | Freq: Once | INTRAVENOUS | Status: DC | PRN
Start: 2016-04-03 — End: 2016-04-07

## 2016-04-03 NOTE — H&P (Signed)
[name] ADMISSION NOTE  Patient Care Team: Wenda Low, MD as PCP - General (Internal Medicine)  CHIEF COMPLAINTS/PURPOSE OF ADMISSION Diffuse loss of B-cell lymphoma  HISTORY OF PRESENTING ILLNESS:  Samuel Berger 71 y.o. 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 Stable ectasia or post stenotic dilatation of the right external iliac artery measuring up to 1.5 cm. Extensive atherosclerotic disease involving the abdominal aorta with a chronic intimal flap or short segment dissection in the infrarenal abdominal aorta. Chronic occlusion of the left common iliac artery. Slowly enlarging retroperitoneal lymph nodes as described. An indolent neoplastic or lymphoproliferative process cannot be excluded      01/06/2016 Imaging    Ct abdomen and pelvis showed new extensive centrally necrotic retroperitoneal and bilateral pelvic lymphadenopathy. Differential includes lymphoproliferative condition (lymphoma/leukemia) or infectious lymphadenitis (such as due to bacterial, viral or mycobacterial etiologies). Squamous cell carcinoma metastases can have a similar appearance, although this is less likely given the location. Clinical and laboratory evaluation for a lymphoproliferative condition and tissue sampling is advised. 2. Aortic atherosclerosis. Stable 3.1 cm infrarenal abdominal aortic aneurysm      01/27/2016 Imaging    CH chest showed no acute cardiopulmonary disease. Known stable retrocrural and periaortic adenopathy. Oval  density over the left neck base measuring 2.5 x 4.5 cm likely due partially to prominent internal jugular vein, although cannot exclude adenopathy in this region. Recommend neck CT with contrast. Three vessel atherosclerotic coronary artery disease. Few small thyroid nodules with the largest measuring 1.2 cm over the left lobe. 2.4 cm left renal cyst. Sub cm right renal hypodensity unchanged and too small to characterize but likely a cyst. Minimal cholelithiasis.      01/31/2016 Imaging    ECHO showed: Left ventricle: The cavity size was normal. Systolic function was normal. The estimated ejection fraction was in the range of 60% to 65%. Wall motion was normal; there were no regional wall motion abnormalities. There was an increased relative contribution of atrial contraction to ventricular filling. Doppler parameters are consistent with abnormal left ventricular relaxation (grade 1 diastolic dysfunction).      02/07/2016 Procedure    He had imaging guided biopsy of supraclavicular lymph node      02/07/2016 Pathology Results    Accession: [id number] Core biopsies reveal diffuse areas of large atypical lymphoid cells. There are multiple cells with very large irregular nuclei or multinucleated cells. Immunohistochemistry reveals the cells are positive for CD20, CD45, bcl-2, and bcl-6. They are negative for CD10, CD30, and ALK. CD3, CD4, CD5 and CD8 reveal scattered T-cells. Ki-67 reveals an elevated proliferation rate (95%). The cells are negative for cytokeratin 7, cytokeratin 20, TTF-1, CDX-2, HMB45, SOX11, and MelanA. Overall, these findings are consistent with a diffuse large B-cell lymphoma      02/12/2016 - 02/21/2016 Hospital Admission    He was admitted to the hospital with sepsis. He received cycle 1 of chemotherapy will hospitalized      02/14/2016 Imaging    Ct  abdomen and pelvis showed widespread retrocrural, retroperitoneal, and bilateral pelvic lymphadenopathy, waxing/waning when  compared to recent CT, as above. Mild right hydronephrosis, likely secondary to extrinsic compression by right retroperitoneal lymphadenopathy, with associated diminished enhancement of the right kidney. Spleen is normal in size.      02/16/2016 Bone Marrow Biopsy    Bone marrow biopsy was negative for lymphoma involvement. Cytogenetics came back abnormal: -Y in 45% of cell lines      02/16/2016 Procedure    He had port placement      02/16/2016 - 02/21/2016 Chemotherapy    He received cycle 1 of R-EPOCH       03/08/2016 - 03/12/2016 Hospital Admission    The patient received cycle 2 of chemotherapy in the hospital      He feels well. Denies new lymphadenopathy. He has minor sinus congestion. No recent fever, chills, sore throat or cough. Denies recent constipation. No peripheral neuropathy   MEDICAL HISTORY:  Past Medical History:  Diagnosis Date  . 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 Carotid artery occlusion   . Dyslipidemia   . GERD (gastroesophageal reflux disease)   . Hepatitis    ? age 69 or 27  . Hyperlipidemia   . Hypertension   . Iliac artery aneurysm, right ([place])   . Stroke ([place])    01-05-2012   [id number]  . Type II or unspecified type diabetes mellitus without mention of complication, not stated as uncontrolled     SURGICAL HISTORY: Past Surgical History:  Procedure Laterality Date  . basket procedure    . CAROTID ENDARTERECTOMY    . ENDARTERECTOMY  02/08/2012   Procedure: ENDARTERECTOMY CAROTID;  Surgeon: Mal Misty, MD;  Location: [place];  Service: Vascular;  Laterality: Right;  . IR GENERIC HISTORICAL  02/07/2016   IR US GUIDE BX ASP/DRAIN 02/07/2016 Greggory Keen, MD [name]  . IR GENERIC HISTORICAL  02/16/2016   IR FLUORO GUIDE PORT INSERTION RIGHT 02/16/2016 Aletta Edouard, MD [name]  . IR GENERIC HISTORICAL  02/16/2016   IR US GUIDE VASC ACCESS RIGHT 02/16/2016 Aletta Edouard, MD [name]  . KIDNEY STONE SURGERY     removal  .  PATCH ANGIOPLASTY  02/08/2012   Procedure: PATCH ANGIOPLASTY;  Surgeon: Mal Misty, MD;  Location: [facility];  Service: Vascular;  Laterality: Right;  with dacron patch angioplasty  . TEE WITHOUT CARDIOVERSION  01/08/2012   Procedure: TRANSESOPHAGEAL ECHOCARDIOGRAM (TEE);  Surgeon: Lelon Perla, MD;  Location[facility] ENDOSCOPY;  Service: Cardiovascular;  Laterality: N/A;  . TONSILLECTOMY      SOCIAL HISTORY: Social History   Social History  . Marital status: Married    Spouse name: anne  . Number of children: 2  . Years of education: college   Occupational History  . retired    Social History Main Topics  . Smoking status: Former Smoker    Packs/day: 0.50    Years: 30.00    Types: Cigars    Quit date: 01/05/2012  . Smokeless tobacco: Former Systems developer  . Alcohol use 1.8 oz/week    1 Glasses of wine, 1 Cans of beer, 1 Shots of liquor per week     Comment: quit 2 months..Patient drinks coffee daily.OCCASIONAL DRINKER  . Drug use: No  . Sexual activity: Not Currently   Other Topics Concern  . Not on file   Social History Narrative  . No narrative on file    FAMILY HISTORY: Family History  Problem Relation Age  of Onset  . Diabetes Mother   . Lung cancer Father     ALLERGIES:  is allergic to bee venom; metformin and related; and sulfa drugs cross reactors.  MEDICATIONS:  Current Facility-Administered Medications  Medication Dose Route Frequency Provider Last Rate Last Dose  . 0.9 %  sodium chloride infusion   Intravenous Continuous Amiree No, MD      . 0.9 %  sodium chloride infusion   Intravenous Continuous Heath Lark, MD      . acetaminophen (TYLENOL) tablet 650 mg  650 mg Oral Q4H PRN Ralphael Southgate, MD      . alteplase (CATHFLO ACTIVASE) injection 2 mg  2 mg Intracatheter Once PRN Heath Lark, MD      . Cold Pack 1 packet  1 packet Topical Once PRN Heath Lark, MD      . DOXOrubicin (ADRIAMYCIN) 22 mg, etoposide (VEPESID) 106 mg, vinCRIStine (ONCOVIN) 0.9 mg in sodium  chloride 0.9 % 500 mL chemo infusion   Intravenous Once Heath Lark, MD      . heparin lock flush 100 unit/mL  250 Units Intracatheter Once PRN Heath Lark, MD      . Hot Pack 1 packet  1 packet Topical Once PRN Heath Lark, MD      . HYDROmorphone (DILAUDID) injection 1 mg  1 mg Intravenous Q2H PRN Cassia Fein, MD      . ondansetron (ZOFRAN) tablet 4-8 mg  4-8 mg Oral Q8H PRN Heath Lark, MD       Or  . ondansetron (ZOFRAN-ODT) disintegrating tablet 4-8 mg  4-8 mg Oral Q8H PRN Heath Lark, MD       Or  . ondansetron (ZOFRAN) injection 4 mg  4 mg Intravenous Q8H PRN Heath Lark, MD       Or  . ondansetron (ZOFRAN) 8 mg in sodium chloride 0.9 % 50 mL IVPB  8 mg Intravenous Q8H PRN Narek Kniss, MD      . ondansetron (ZOFRAN) 8 mg, dexamethasone (DECADRON) 10 mg in sodium chloride 0.9 % 50 mL IVPB   Intravenous Once Heath Lark, MD      . senna (SENOKOT) tablet 8.6 mg  1 tablet Oral BID Heath Lark, MD      . sodium chloride flush (NS) 0.9 % injection 10 mL  10 mL Intracatheter PRN Heath Lark, MD      . sodium chloride flush (NS) 0.9 % injection 3 mL  3 mL Intravenous PRN Heath Lark, MD        REVIEW OF SYSTEMS:   Constitutional: Denies fevers, chills or abnormal night sweats Eyes: Denies blurriness of vision, double vision or watery eyes Ears, nose, mouth, throat, and face: Denies mucositis or sore throat Respiratory: Denies cough, dyspnea or wheezes Cardiovascular: Denies palpitation, chest discomfort or lower extremity swelling Gastrointestinal:  Denies nausea, heartburn or change in bowel habits Skin: Denies abnormal skin rashes Lymphatics: Denies new lymphadenopathy or easy bruising Neurological:Denies numbness, tingling or new weaknesses Behavioral/Psych: Mood is stable, no new changes  All other systems were reviewed with the patient and are negative.  PHYSICAL EXAMINATION: ECOG PERFORMANCE STATUS: 0 - Asymptomatic  Vitals:   04/03/16 0850  BP: 139/70  Pulse: (!) 107  Resp: 18  Temp:  98.3 F (36.8 C)   Filed Weights   04/03/16 0850  Weight: 188 lb (85.3 kg)    GENERAL:alert, no distress and comfortable SKIN: skin color, texture, turgor are normal, no rashes or significant lesions EYES: normal, conjunctiva are pink  and non-injected, sclera clear OROPHARYNX:no exudate, no erythema and lips, buccal mucosa, and tongue normal  NECK: supple, thyroid normal size, non-tender, without nodularity LYMPH:  no palpable lymphadenopathy in the cervical, axillary or inguinal LUNGS: clear to auscultation and percussion with normal breathing effort HEART: regular rate & rhythm and no murmurs and no lower extremity edema ABDOMEN:abdomen soft, non-tender and normal bowel sounds Musculoskeletal:no cyanosis of digits and no clubbing  PSYCH: alert & oriented x 3 with fluent speech NEURO: no focal motor/sensory deficits  LABORATORY DATA:  I have reviewed the data as listed Lab Results  Component Value Date   WBC 13.7 (H) 03/29/2016   HGB 10.7 (L) 03/29/2016   HCT 32.5 (L) 03/29/2016   MCV 89.0 03/29/2016   PLT 232 03/29/2016    Recent Labs  03/10/16 0413 03/11/16 0411 03/12/16 0420 03/19/16 0852 03/22/16 1155 03/29/16 1121  NA 136 136 136 135* 135* 139  K 4.2 4.8 4.0 4.2 4.9 4.5  CL 101 100* 100*  --   --   --   CO2 _0 GLUCOSE 147* 160* 151* 124 119 159*  BUN _1 16.4 13.0 12.5  CREATININE 0.65 0.71 0.62 0.7 0.8 0.8  CALCIUM 8.7* 9.0 8.6* 9.4 9.6 9.5  GFRNONAA >60 >60 >60  --   --   --   GFRAA >60 >60 >60  --   --   --   PROT 5.7* 5.8* 5.5* 6.1* 6.6 6.4  ALBUMIN 3.1* 3.2* 2.9* 3.1* 3.5 3.2*  AST 13* 14* _2 ALT 15* 15* 16* _3 ALKPHOS 48 47 44 64 76 72  BILITOT 0.4 0.6 0.6 0.43 <0.22 <0.22    RADIOGRAPHIC STUDIES: I have personally reviewed the radiological images as listed and agreed with the findings in the report. Nm Pet Image Initial (pi) Skull Base To Thigh  Result Date: 03/27/2016 CLINICAL DATA:  Initial treatment  strategy for diffuse large B-cell lymphoma. Status post chemotherapy. Evaluate treatment response. EXAM: NUCLEAR MEDICINE PET SKULL BASE TO THIGH TECHNIQUE: 9.1 mCi F-18 FDG was injected intravenously. Full-ring PET imaging was performed from the skull base to thigh after the radiotracer. CT data was obtained and used for attenuation correction and anatomic localization. FASTING BLOOD GLUCOSE:  Value: 123 mg/dl COMPARISON:  CT on 02/14/2016 FINDINGS: NECK No hypermetabolic lymph nodes in the neck. CHEST No hypermetabolic mediastinal or hilar nodes. No suspicious pulmonary nodules on the CT scan. ABDOMEN/PELVIS No abnormal hypermetabolic activity within the liver, pancreas, adrenal glands, or spleen. Significant decrease in abdominal and pelvic lymphadenopathy is demonstrated since previous study. Index retroperitoneal lymph node in the aortocaval space currently measures 11 mm on image 147/for compared to 1.6 cm previously. This has an SUV max of 3.3. Index centrally necrotic right common iliac lymphadenopathy 2.4 cm on image 162/4 compared to 3.3 cm previously. This has an SUV max of 4.7. Index lymph node in left obturator/external iliac region currently measures 1.1 cm on image 183/4 compared to 2.9 cm previously. This has SUV max of 2.3. No new lymphadenopathy identified. Probable tiny calcified gallstones again noted, without evidence cholecystitis. Right hydronephrosis has resolved since previous study. Stable small left upper pole renal cyst. 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 Stable small fat containing left inguinal hernia . SKELETON No focal hypermetabolic activity to suggest skeletal metastasis. Diffusely increased bone marrow FDG uptake is seen, consistent with treatment response. IMPRESSION: Significant decrease in size of  abdominal and pelvic lymphadenopathy compared to previous CT, which shows low-grade metabolic activity. No evidence of active lymphoma within the neck or chest.  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 Continued annual follow-up by CT is recommended. This recommendation follows ACR consensus guidelines: White Paper of the ACR Incidental Findings Committee II on Vascular Findings. J Am Coll Radiol [phone]. Electronically Signed   By: Earle Gell M.D.   On: 03/27/2016 11:07    ASSESSMENT & PLAN:   Diffuse large B-cell lymphoma of lymph nodes of multiple regions [facility]) The patient appears to respond well to treatment clinically and on recent imaging study He is admitted to the hospital for cycle 3 of treatment. He will return on January 8 to the cancer center for Neulasta injection. The following week, he will get blood drawn for blood count monitoring We discussed intrathecal CNS prophylaxis I will ask for radiologist assistant for this for tomorrow  Essential hypertension  I recommend holding off lisinopril for now due to recent hypotension  Mild protein calorie malnutrition His appetite is preserved. Recommend he continue nutritional intake as tolerated  DVT prophylaxis I will hold off Lovenox in anticipation for IT chemotherapy tomorrow Will resume Lovenox after the procedure  History of severe constipation I will schedule regular Senokot and Miralax prn  CODE STATUS Full code  Discharge planning He will be discharge at the end of the week after completion of treatment  All questions were answered. The patient knows to call the clinic with any problems, questions or concerns    Heath Lark, MD 04/03/2016 9:39 AM

## 2016-04-03 NOTE — Progress Notes (Signed)
Calculation and dosage of Rituxan, Doxorubicin ,Etoposide and Vincristine was verified by another chemo certified nurse Aldean Baker

## 2016-04-04 ENCOUNTER — Telehealth: Payer: Self-pay | Admitting: *Deleted

## 2016-04-04 ENCOUNTER — Other Ambulatory Visit: Payer: Self-pay | Admitting: Hematology and Oncology

## 2016-04-04 ENCOUNTER — Telehealth: Payer: Self-pay | Admitting: Hematology and Oncology

## 2016-04-04 MED ORDER — SENNA 8.6 MG PO TABS
2.0000 | ORAL_TABLET | Freq: Two times a day (BID) | ORAL | Status: DC
  Administered 2016-04-04 – 2016-04-07 (×7): 17.2 mg via ORAL
  Filled 2016-04-04 (×7): qty 2

## 2016-04-04 MED ORDER — ONDANSETRON HCL 40 MG/20ML IJ SOLN
Freq: Once | INTRAMUSCULAR | Status: AC
  Administered 2016-04-04: 18 mg via INTRAVENOUS
  Filled 2016-04-04: qty 4

## 2016-04-04 MED ORDER — ENOXAPARIN SODIUM 40 MG/0.4ML ~~LOC~~ SOLN
40.0000 mg | SUBCUTANEOUS | Status: DC
  Administered 2016-04-04 – 2016-04-06 (×3): 40 mg via SUBCUTANEOUS
  Filled 2016-04-04 (×3): qty 0.4

## 2016-04-04 MED ORDER — VINCRISTINE SULFATE CHEMO INJECTION 1 MG/ML
Freq: Once | INTRAVENOUS | Status: AC
  Administered 2016-04-04: 19:00:00 via INTRAVENOUS
  Filled 2016-04-04: qty 11

## 2016-04-04 MED ORDER — VINCRISTINE SULFATE CHEMO INJECTION 1 MG/ML: Freq: Once | INTRAVENOUS | Status: DC

## 2016-04-04 MED ORDER — POLYETHYLENE GLYCOL 3350 17 G PO PACK
17.0000 g | PACK | Freq: Every day | ORAL | Status: DC
  Administered 2016-04-04 – 2016-04-07 (×4): 17 g via ORAL
  Filled 2016-04-04 (×4): qty 1

## 2016-04-04 NOTE — Progress Notes (Signed)
Samuel Berger   DOB:[birth year]   [id number]    Subjective: he tolerated cycle 3 day 1 chemo well. Mildly constipation. No nausea, pain, or mucositis  Objective:  Vitals:   04/03/16 2118 04/04/16 0540  BP: 130/64 117/65  Pulse: 94 80  Resp: 19 18  Temp: 97.3 F (36.3 C) 97.7 F (36.5 C)     Intake/Output Summary (Last 24 hours) at 04/04/16 [id number] Last data filed at 04/03/16 1832  Gross per 24 hour  Intake             1358 ml  Output              850 ml  Net              508 ml    GENERAL:alert, no distress and comfortable SKIN: skin color, texture, turgor are normal, no rashes or significant lesions EYES: normal, Conjunctiva are pink and non-injected, sclera clear LUNGS: clear to auscultation and percussion with normal breathing effort HEART: regular rate & rhythm and no murmurs and no lower extremity edema ABDOMEN:abdomen soft, non-tender and normal bowel sounds Musculoskeletal:no cyanosis of digits and no clubbing  NEURO: alert & oriented x 3 with fluent speech, no focal motor/sensory deficits   Labs:  Lab Results  Component Value Date   WBC 13.7 (H) 03/29/2016   HGB 10.7 (L) 03/29/2016   HCT 32.5 (L) 03/29/2016   MCV 89.0 03/29/2016   PLT 232 03/29/2016   NEUTROABS 10.0 (H) 03/29/2016    Lab Results  Component Value Date   NA 139 03/29/2016   K 4.5 03/29/2016   CL 100 (L) 03/12/2016   CO2 28 03/29/2016   Assessment & Plan:   Diffuse large B-cell lymphoma of lymph nodes of multiple regions [facility]) The patient appears to respond well to treatment clinically and on recent imaging study He is admitted to the hospital for cycle 3 of treatment. He will return on January 8 to the cancer center for Neulasta injection. The following week, he will get blood drawn for blood count monitoring We discussed intrathecal CNS prophylaxis, hopefully to be accomplished today  Mild protein calorie malnutrition His appetite is preserved. Recommend he continue nutritional  intake as tolerated  DVT prophylaxis I will hold off Lovenox in anticipation for IT chemotherapy  Will resume Lovenox after the procedure  History of severe constipation I will schedule regular Senokot and Miralax daily  CODE STATUS Full code  Discharge planning He will be discharge at the end of the week after completion of treatment   Heath Lark, MD 04/04/2016  7:09 AM

## 2016-04-04 NOTE — Progress Notes (Signed)
Chemo dosages and dilutions verified with Aldean Baker, RN

## 2016-04-04 NOTE — Telephone Encounter (Signed)
Spoke with patient and confirmed the 04/09/16 appointment

## 2016-04-04 NOTE — Telephone Encounter (Signed)
Appt scheduled for IT chemo on Wednesday, 04/11/16 @ 1100.   Pt to report to admitting @ 0945. Short Stay 1000- aware of appt and IR ([name])  1100- aware of appt.   Message to Leodis Rains for PA   Gave patient appt information

## 2016-04-05 LAB — CBC WITH DIFFERENTIAL/PLATELET
BASOS ABS: 0 10*3/uL (ref 0.0–0.1)
Basophils Relative: 0 %
EOS ABS: 0 10*3/uL (ref 0.0–0.7)
EOS PCT: 0 %
HCT: 28.6 % — ABNORMAL LOW (ref 39.0–52.0)
Hemoglobin: 9.8 g/dL — ABNORMAL LOW (ref 13.0–17.0)
Lymphocytes Relative: 13 %
Lymphs Abs: 0.6 10*3/uL — ABNORMAL LOW (ref 0.7–4.0)
MCH: 30.4 pg (ref 26.0–34.0)
MCHC: 34.3 g/dL (ref 30.0–36.0)
MCV: 88.8 fL (ref 78.0–100.0)
Monocytes Absolute: 0.9 10*3/uL (ref 0.1–1.0)
Monocytes Relative: 20 %
Neutro Abs: 3.2 10*3/uL (ref 1.7–7.7)
Neutrophils Relative %: 67 %
PLATELETS: 274 10*3/uL (ref 150–400)
RBC: 3.22 MIL/uL — AB (ref 4.22–5.81)
RDW: 18.8 % — ABNORMAL HIGH (ref 11.5–15.5)
WBC: 4.7 10*3/uL (ref 4.0–10.5)

## 2016-04-05 LAB — COMPREHENSIVE METABOLIC PANEL
ALT: 10 U/L — AB (ref 17–63)
AST: 17 U/L (ref 15–41)
Albumin: 3.2 g/dL — ABNORMAL LOW (ref 3.5–5.0)
Alkaline Phosphatase: 42 U/L (ref 38–126)
Anion gap: 8 (ref 5–15)
BUN: 20 mg/dL (ref 6–20)
CHLORIDE: 104 mmol/L (ref 101–111)
CO2: 26 mmol/L (ref 22–32)
CREATININE: 0.66 mg/dL (ref 0.61–1.24)
Calcium: 8.7 mg/dL — ABNORMAL LOW (ref 8.9–10.3)
GFR calc Af Amer: >60 mL/min (ref >60)
GFR calc non Af Amer: >60 mL/min (ref >60)
Glucose, Bld: 160 mg/dL — ABNORMAL HIGH (ref 65–99)
POTASSIUM: 4.1 mmol/L (ref 3.5–5.1)
SODIUM: 138 mmol/L (ref 135–145)
Total Bilirubin: 0.2 mg/dL — ABNORMAL LOW (ref 0.3–1.2)
Total Protein: 5.7 g/dL — ABNORMAL LOW (ref 6.5–8.1)

## 2016-04-05 LAB — URIC ACID: URIC ACID, SERUM: 3 mg/dL — AB (ref 4.4–7.6)

## 2016-04-05 LAB — LACTATE DEHYDROGENASE: LDH: 135 U/L (ref 98–192)

## 2016-04-05 MED ORDER — VINCRISTINE SULFATE CHEMO INJECTION 1 MG/ML
Freq: Once | INTRAVENOUS | Status: AC
  Administered 2016-04-05: 18:00:00 via INTRAVENOUS
  Filled 2016-04-05 (×2): qty 11

## 2016-04-05 MED ORDER — SODIUM CHLORIDE 0.9 % IV SOLN
Freq: Once | INTRAVENOUS | Status: AC
  Administered 2016-04-05: 18 mg via INTRAVENOUS
  Filled 2016-04-05 (×2): qty 4

## 2016-04-05 NOTE — Progress Notes (Signed)
Samuel Berger   DOB:[birth year]   [id number]    Subjective: He feels well. Denies nausea or constipation. Tolerated chemotherapy well without side effects  Objective:  Vitals:   04/04/16 2139 04/05/16 0610  BP: 113/64 135/75  Pulse: 86 74  Resp: 16 16  Temp: 98.4 F (36.9 C) 97.9 F (36.6 C)     Intake/Output Summary (Last 24 hours) at 04/05/16 1421 Last data filed at 04/05/16 1031  Gross per 24 hour  Intake              640 ml  Output                0 ml  Net              640 ml    GENERAL:alert, no distress and comfortable SKIN: skin color, texture, turgor are normal, no rashes or significant lesions EYES: normal, Conjunctiva are pink and non-injected, sclera clear Musculoskeletal:no cyanosis of digits and no clubbing  NEURO: alert & oriented x 3 with fluent speech, no focal motor/sensory deficits   Labs:  Lab Results  Component Value Date   WBC 4.7 04/05/2016   HGB 9.8 (L) 04/05/2016   HCT 28.6 (L) 04/05/2016   MCV 88.8 04/05/2016   PLT 274 04/05/2016   NEUTROABS 3.2 04/05/2016    Lab Results  Component Value Date   NA 138 04/05/2016   K 4.1 04/05/2016   CL 104 04/05/2016   CO2 26 04/05/2016   Assessment & Plan:   Diffuse large B-cell lymphoma of lymph nodes of multiple regions [facility]) The patient appears to respond well to treatment clinically and on recent imaging study He is admitted to the hospital for cycle 3of treatment. He will return on January 8to the cancer center for Neulasta injection. The following week, he will get blood drawn for blood count monitoring We discussed intrathecal CNS prophylaxis, hopefully to be accomplished next week, delayed due to recent Plavix use  Mild protein calorie malnutrition His appetite is preserved. Recommend he continue nutritional intake as tolerated  DVT prophylaxis On Lovenox  History of severe constipation I will schedule regular Senokot and Miralax daily  CODE STATUS Full code  Discharge  planning He will be discharge at the end of the week after completion of treatment   Heath Lark, MD 04/05/2016  2:21 PM

## 2016-04-06 ENCOUNTER — Telehealth: Payer: Self-pay | Admitting: *Deleted

## 2016-04-06 MED ORDER — SODIUM CHLORIDE 0.9 % IV SOLN
Freq: Once | INTRAVENOUS | Status: AC
  Administered 2016-04-06: 8 mg via INTRAVENOUS
  Filled 2016-04-06: qty 4

## 2016-04-06 MED ORDER — VINCRISTINE SULFATE CHEMO INJECTION 1 MG/ML
Freq: Once | INTRAVENOUS | Status: AC
  Administered 2016-04-06: 16:00:00 via INTRAVENOUS
  Filled 2016-04-06 (×3): qty 11

## 2016-04-06 NOTE — Telephone Encounter (Signed)
Spoke with [name] in IR regarding taking ASA 81 mg prior to IT chemo. [name] states it is OK to take ASA 81 mg every day as long as no other blood thinners. Will NOT need to hold prior to IT chemo.

## 2016-04-06 NOTE — Progress Notes (Signed)
Samuel Berger   DOB:01/13/1946   [id number]    Subjective: He tolerated treatment well. Denies constipation. No nausea or weakness  Objective:  Vitals:   04/05/16 2229 04/06/16 0633  BP: 123/72 (!) 145/71  Pulse: 83 74  Resp: 20 20  Temp: 97.7 F (36.5 C) 97.5 F (36.4 C)     Intake/Output Summary (Last 24 hours) at 04/06/16 1319 Last data filed at 04/05/16 1900  Gross per 24 hour  Intake              240 ml  Output                0 ml  Net              240 ml    GENERAL:alert, no distress and comfortable SKIN: skin color, texture, turgor are normal, no rashes or significant lesions EYES: normal, Conjunctiva are pink and non-injected, sclera clear Musculoskeletal:no cyanosis of digits and no clubbing  NEURO: alert & oriented x 3 with fluent speech, no focal motor/sensory deficits   Labs:  Lab Results  Component Value Date   WBC 4.7 04/05/2016   HGB 9.8 (L) 04/05/2016   HCT 28.6 (L) 04/05/2016   MCV 88.8 04/05/2016   PLT 274 04/05/2016   NEUTROABS 3.2 04/05/2016    Lab Results  Component Value Date   NA 138 04/05/2016   K 4.1 04/05/2016   CL 104 04/05/2016   CO2 26 04/05/2016    Assessment & Plan:   Diffuse large B-cell lymphoma of lymph nodes of multiple regions [facility]) The patient appears to respond well to treatment clinically and on recent imaging study He is admitted to the hospital for cycle 3of treatment. He will return on January 8to the cancer center for Neulasta injection and 04/11/16 for IT chemo The following week, he will get blood drawn for blood count monitoring We discussed intrathecal CNS prophylaxis, hopefully to be accomplished next week, delayed due to recent Plavix use  Mild protein calorie malnutrition His appetite is preserved. Recommend he continue nutritional intake as tolerated  DVT prophylaxis On Lovenox  History of severe constipation I will schedule regular Senokot and Miralax daily  CODE STATUS Full  code  Discharge planning He will be discharge at the end of the week after completion of treatment   Heath Lark, MD 04/06/2016  1:19 PM

## 2016-04-07 MED ORDER — SODIUM CHLORIDE 0.9 % IV SOLN
750.0000 mg/m2 | Freq: Once | INTRAVENOUS | Status: AC
  Administered 2016-04-07: 1600 mg via INTRAVENOUS
  Filled 2016-04-07: qty 80

## 2016-04-07 MED ORDER — HEPARIN SOD (PORK) LOCK FLUSH 100 UNIT/ML IV SOLN
500.0000 [IU] | Freq: Once | INTRAVENOUS | Status: AC
  Administered 2016-04-07: 500 [IU] via INTRAVENOUS
  Filled 2016-04-07: qty 5

## 2016-04-07 MED ORDER — SODIUM CHLORIDE 0.9 % IV SOLN
Freq: Once | INTRAVENOUS | Status: AC
  Administered 2016-04-07: 36 mg via INTRAVENOUS
  Filled 2016-04-07: qty 8

## 2016-04-07 NOTE — Discharge Summary (Signed)
Physician Discharge Summary  Patient ID: Samuel Berger MRN: [id number] DOB/AGE: Jul 05, 1945 71 y.o.  Admit date: 04/03/2016 Discharge date: 04/07/2016  Primary Care Physician:  Wenda Low, MD   Discharge Diagnoses:    Present on Admission: . Diffuse large B-cell lymphoma of lymph nodes of multiple regions ([place]) . Essential hypertension . Diffuse large B cell lymphoma ([place])   Discharge Medications:  Allergies as of 04/07/2016      Reactions   Bee Venom Hives   Metformin And Related    dizziness   Sulfa Drugs Cross Reactors Other (See Comments)   Unknown      Medication List    STOP taking these medications   clopidogrel 75 MG tablet Commonly known as:  PLAVIX     TAKE these medications   amLODipine 10 MG tablet Commonly known as:  NORVASC Take 10 mg by mouth daily.   atorvastatin 40 MG tablet Commonly known as:  LIPITOR Take 40 mg by mouth daily at 6 PM.   lactulose 10 GM/15ML solution Commonly known as:  CHRONULAC Take 15 mLs (10 g total) by mouth 3 (three) times daily.   lidocaine-prilocaine cream Commonly known as:  EMLA Apply 1 application topically as needed. What changed:  when to take this  reasons to take this   lisinopril 5 MG tablet Commonly known as:  PRINIVIL,ZESTRIL Take 5 mg by mouth daily.   ondansetron 8 MG tablet Commonly known as:  ZOFRAN Take 1 tablet (8 mg total) by mouth every 8 (eight) hours as needed for nausea or vomiting.   pioglitazone 30 MG tablet Commonly known as:  ACTOS Take 30 mg by mouth daily.   prochlorperazine 10 MG tablet Commonly known as:  COMPAZINE Take 1 tablet (10 mg total) by mouth every 6 (six) hours as needed.   sennosides-docusate sodium 8.6-50 MG tablet Commonly known as:  SENOKOT-S Take 2 tablets by mouth daily.       Disposition and Follow-up:   Significant Diagnostic Studies:  Nm Pet Image Initial (pi) Skull Base To Thigh  Result Date: 03/27/2016 CLINICAL DATA:  Initial  treatment strategy for diffuse large B-cell lymphoma. Status post chemotherapy. Evaluate treatment response. EXAM: NUCLEAR MEDICINE PET SKULL BASE TO THIGH TECHNIQUE: 9.1 mCi F-18 FDG was injected intravenously. Full-ring PET imaging was performed from the skull base to thigh after the radiotracer. CT data was obtained and used for attenuation correction and anatomic localization. FASTING BLOOD GLUCOSE:  Value: 123 mg/dl COMPARISON:  CT on 02/14/2016 FINDINGS: NECK No hypermetabolic lymph nodes in the neck. CHEST No hypermetabolic mediastinal or hilar nodes. No suspicious pulmonary nodules on the CT scan. ABDOMEN/PELVIS No abnormal hypermetabolic activity within the liver, pancreas, adrenal glands, or spleen. Significant decrease in abdominal and pelvic lymphadenopathy is demonstrated since previous study. Index retroperitoneal lymph node in the aortocaval space currently measures 11 mm on image 147/for compared to 1.6 cm previously. This has an SUV max of 3.3. Index centrally necrotic right common iliac lymphadenopathy 2.4 cm on image 162/4 compared to 3.3 cm previously. This has an SUV max of 4.7. Index lymph node in left obturator/external iliac region currently measures 1.1 cm on image 183/4 compared to 2.9 cm previously. This has SUV max of 2.3. No new lymphadenopathy identified. Probable tiny calcified gallstones again noted, without evidence cholecystitis. Right hydronephrosis has resolved since previous study. Stable small left upper pole renal cyst. 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 Stable small fat containing left inguinal hernia . SKELETON No  focal hypermetabolic activity to suggest skeletal metastasis. Diffusely increased bone marrow FDG uptake is seen, consistent with treatment response. IMPRESSION: Significant decrease in size of abdominal and pelvic lymphadenopathy compared to previous CT, which shows low-grade metabolic activity. No evidence of active lymphoma within the neck or  chest. 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 Continued annual follow-up by CT is recommended. This recommendation follows ACR consensus guidelines: White Paper of the ACR Incidental Findings Committee II on Vascular Findings. J Am Coll Radiol [phone]. Electronically Signed   By: Earle Gell M.D.   On: 03/27/2016 11:07    Discharge Laboratory Values: Lab Results  Component Value Date   WBC 4.7 04/05/2016   HGB 9.8 (L) 04/05/2016   HCT 28.6 (L) 04/05/2016   MCV 88.8 04/05/2016   PLT 274 04/05/2016   Lab Results  Component Value Date   NA 138 04/05/2016   K 4.1 04/05/2016   CL 104 04/05/2016   CO2 26 04/05/2016    Brief H and P: For complete details please refer to admission H and P, but in brief, The patient was admitted to the hospital on 04/03/2016 for cycle 3 of chemotherapy. He tolerated treatment well without side effects. Plavix was discontinued on discharge in anticipation for intrathecal chemotherapy next week  Physical Exam at Discharge: BP 132/79 (BP Location: Right Arm)   Pulse 77   Temp 97.8 F (36.6 C) (Oral)   Resp 16   Ht 5\' 11"  (1.803 m)   Wt 188 lb (85.3 kg)   SpO2 98%   BMI 26.22 kg/m  GENERAL:alert, no distress and comfortable SKIN: skin color, texture, turgor are normal, no rashes or significant lesions EYES: normal, Conjunctiva are pink and non-injected, sclera clear OROPHARYNX:no exudate, no erythema and lips, buccal mucosa, and tongue normal  NECK: supple, thyroid normal size, non-tender, without nodularity LYMPH:  no palpable lymphadenopathy in the cervical, axillary or inguinal LUNGS: clear to auscultation and percussion with normal breathing effort HEART: regular rate & rhythm and no murmurs and no lower extremity edema ABDOMEN:abdomen soft, non-tender and normal bowel sounds Musculoskeletal:no cyanosis of digits and no clubbing  NEURO: alert & oriented x 3 with fluent speech, no focal motor/sensory deficits  Hospital Course:   Active Problems:   Diffuse large B-cell lymphoma of lymph nodes of multiple regions [facility])   Essential hypertension   Diffuse large B cell lymphoma ([facility])   Diet:  Regular  Activity:  As tolerated  Condition at Discharge:   stable  Signed: Dr. Heath Lark [phone]  04/07/2016, 7:31 AM

## 2016-04-07 NOTE — Progress Notes (Signed)
Chemo dosage and dilutions verified with Jacalyn Lefevre

## 2016-04-09 ENCOUNTER — Ambulatory Visit (HOSPITAL_BASED_OUTPATIENT_CLINIC_OR_DEPARTMENT_OTHER): Payer: Medicare Other

## 2016-04-09 VITALS — BP 127/74 | HR 98 | Temp 97.9°F | Resp 22

## 2016-04-09 DIAGNOSIS — C8330 Diffuse large B-cell lymphoma, unspecified site: Secondary | ICD-10-CM | POA: Diagnosis present

## 2016-04-09 DIAGNOSIS — C8338 Diffuse large B-cell lymphoma, lymph nodes of multiple sites: Secondary | ICD-10-CM

## 2016-04-09 DIAGNOSIS — Z95828 Presence of other vascular implants and grafts: Secondary | ICD-10-CM

## 2016-04-09 MED ORDER — PEGFILGRASTIM INJECTION 6 MG/0.6ML ~~LOC~~
6.0000 mg | PREFILLED_SYRINGE | Freq: Once | SUBCUTANEOUS | Status: AC
  Administered 2016-04-09: 6 mg via SUBCUTANEOUS
  Filled 2016-04-09: qty 0.6

## 2016-04-09 NOTE — Patient Instructions (Signed)
Pegfilgrastim injection What is this medicine? PEGFILGRASTIM (PEG fil gra stim) is a long-acting granulocyte colony-stimulating factor that stimulates the growth of neutrophils, a type of white blood cell important in the body's fight against infection. It is used to reduce the incidence of fever and infection in patients with certain types of cancer who are receiving chemotherapy that affects the bone marrow, and to increase survival after being exposed to high doses of radiation. This medicine may be used for other purposes; ask your health care provider or pharmacist if you have questions. COMMON BRAND NAME(S): Neulasta What should I tell my health care provider before I take this medicine? They need to know if you have any of these conditions: -kidney disease -latex allergy -ongoing radiation therapy -sickle cell disease -skin reactions to acrylic adhesives (On-Body Injector only) -an unusual or allergic reaction to pegfilgrastim, filgrastim, other medicines, foods, dyes, or preservatives -pregnant or trying to get pregnant -breast-feeding How should I use this medicine? This medicine is for injection under the skin. If you get this medicine at home, you will be taught how to prepare and give the pre-filled syringe or how to use the On-body Injector. Refer to the patient Instructions for Use for detailed instructions. Use exactly as directed. Take your medicine at regular intervals. Do not take your medicine more often than directed. It is important that you put your used needles and syringes in a special sharps container. Do not put them in a trash can. If you do not have a sharps container, call your pharmacist or healthcare provider to get one. Talk to your pediatrician regarding the use of this medicine in children. While this drug may be prescribed for selected conditions, precautions do apply. Overdosage: If you think you have taken too much of this medicine contact a poison control  center or emergency room at once. NOTE: This medicine is only for you. Do not share this medicine with others. What if I miss a dose? It is important not to miss your dose. Call your doctor or health care professional if you miss your dose. If you miss a dose due to an On-body Injector failure or leakage, a new dose should be administered as soon as possible using a single prefilled syringe for manual use. What may interact with this medicine? Interactions have not been studied. Give your health care provider a list of all the medicines, herbs, non-prescription drugs, or dietary supplements you use. Also tell them if you smoke, drink alcohol, or use illegal drugs. Some items may interact with your medicine. This list may not describe all possible interactions. Give your health care provider a list of all the medicines, herbs, non-prescription drugs, or dietary supplements you use. Also tell them if you smoke, drink alcohol, or use illegal drugs. Some items may interact with your medicine. What should I watch for while using this medicine? You may need blood work done while you are taking this medicine. If you are going to need a MRI, CT scan, or other procedure, tell your doctor that you are using this medicine (On-Body Injector only). What side effects may I notice from receiving this medicine? Side effects that you should report to your doctor or health care professional as soon as possible: -allergic reactions like skin rash, itching or hives, swelling of the face, lips, or tongue -dizziness -fever -pain, redness, or irritation at site where injected -pinpoint red spots on the skin -red or dark-brown urine -shortness of breath or breathing problems -stomach or   side pain, or pain at the shoulder -swelling -tiredness -trouble passing urine or change in the amount of urine Side effects that usually do not require medical attention (report to your doctor or health care professional if they  continue or are bothersome): -bone pain -muscle pain This list may not describe all possible side effects. Call your doctor for medical advice about side effects. You may report side effects to FDA at 1-800-FDA-1088. Where should I keep my medicine? Keep out of the reach of children. Store pre-filled syringes in a refrigerator between 2 and 8 degrees C (36 and 46 degrees F). Do not freeze. Keep in carton to protect from light. Throw away this medicine if it is left out of the refrigerator for more than 48 hours. Throw away any unused medicine after the expiration date. NOTE: This sheet is a summary. It may not cover all possible information. If you have questions about this medicine, talk to your doctor, pharmacist, or health care provider.  2017 Elsevier/Gold Standard (2014-04-08 14:30:14)  

## 2016-04-10 ENCOUNTER — Telehealth: Payer: Self-pay | Admitting: *Deleted

## 2016-04-10 NOTE — Telephone Encounter (Signed)
I do not feel strongly this needs to be cancelled But if he is not able to lie still due to coughing, that IT chemo would need to be cancelled and rescheduled to the future (indeterminate depending on future lab results)

## 2016-04-10 NOTE — Telephone Encounter (Signed)
Call from pt reporting "terrible cough" with dark yellow sputum. Pt denies dyspnea or fever. Informed him this is likely viral and will need to run its course. Push PO fluids, OK to use OTC expectorant cough med like Mucinex.  Pt is concerned this will interfere with intrathecal chemo on 1/10.  Message to Dr. Alvy Bimler for review.

## 2016-04-11 ENCOUNTER — Telehealth: Payer: Self-pay | Admitting: *Deleted

## 2016-04-11 ENCOUNTER — Ambulatory Visit (HOSPITAL_COMMUNITY)
Admission: AD | Admit: 2016-04-11 | Payer: Medicare Other | Source: Ambulatory Visit | Admitting: Hematology and Oncology

## 2016-04-11 ENCOUNTER — Other Ambulatory Visit: Payer: Self-pay | Admitting: Hematology and Oncology

## 2016-04-11 DIAGNOSIS — C8338 Diffuse large B-cell lymphoma, lymph nodes of multiple sites: Secondary | ICD-10-CM

## 2016-04-11 NOTE — Telephone Encounter (Signed)
S/w wife and clarified pt is not on Lovenox.  Gave instructions to hold plavix for 7 days.  She verbalized understanding.

## 2016-04-11 NOTE — Telephone Encounter (Signed)
[facility] in Radiology says they do have availability to move appt to Friday 1/19 at 37 am.  Dr. Alvy Bimler will need to place new orders for IT chemo for 1/19.  Once new orders are in then this nurse can call Radiology Scheduling to get it scheduled.

## 2016-04-11 NOTE — Telephone Encounter (Signed)
Order placed IT chemo under supportive therapy

## 2016-04-11 NOTE — Telephone Encounter (Signed)
Informed pt he does not need to cancel his IT chemo today if he feels like he can lay down and stay still for procedure.  Pt says he is not sure if he can lay still today.  He feels like he just needs to stay home and rest.  He is not sure when he is going to feel better but asks if the IT chemo can be rescheduled to next week?  Informed pt will cancel today's procedure and will ask Dr. Alvy Bimler when to reschedule.  Call us if he develops fever or symptoms worsen.  He verbalized understanding.

## 2016-04-11 NOTE — Telephone Encounter (Signed)
In that case, please reschedule to next Fri 1/19 (i anticipate his CBC will dip early next week but should recover by Fri)

## 2016-04-11 NOTE — Telephone Encounter (Signed)
He is not on lovenox since he is out of hospital Pplavix hold for 7 days

## 2016-04-11 NOTE — Telephone Encounter (Signed)
Scheduled IT for Friday 1/19 at 11 am.  Pt to arrive at 10 am to Radiology dept.. NPO after midnight.  LVM for pt informing of appts.  I will call back to clarify his medications.  I see a previous note that pt was holding lovenox and plavix for this procedure.   Clarified with Radiology pt is to hold Plavix for 7 days prior to procedure- so he could take it today and tomorrow, then hold Friday until after procedure.  Lovenox he can resume and then hold starting day before procedure.  I left VM for pt to call nurse back to confirm instructions for holding plavix and lovenox.

## 2016-04-13 ENCOUNTER — Other Ambulatory Visit: Payer: Self-pay | Admitting: *Deleted

## 2016-04-13 ENCOUNTER — Telehealth: Payer: Self-pay | Admitting: Hematology and Oncology

## 2016-04-13 ENCOUNTER — Telehealth: Payer: Self-pay | Admitting: *Deleted

## 2016-04-13 ENCOUNTER — Other Ambulatory Visit: Payer: Self-pay | Admitting: Hematology and Oncology

## 2016-04-13 DIAGNOSIS — C8338 Diffuse large B-cell lymphoma, lymph nodes of multiple sites: Secondary | ICD-10-CM

## 2016-04-13 DIAGNOSIS — J209 Acute bronchitis, unspecified: Secondary | ICD-10-CM

## 2016-04-13 MED ORDER — AMOXICILLIN-POT CLAVULANATE 875-125 MG PO TABS: 1.0000 | ORAL_TABLET | Freq: Two times a day (BID) | ORAL | 0 refills | Status: DC

## 2016-04-13 NOTE — Telephone Encounter (Signed)
I spoke with the patient's wife. I typically do not prescribe oral antibiotics over to telephone However, the patient had received recent chemotherapy and he cannot be worked in to the office schedule today I recommend trial of oral antibiotic with Augmentin twice a day for 7 days. He will come in Monday for blood draw and chest x-ray and will see him Monday morning.

## 2016-04-13 NOTE — Telephone Encounter (Signed)
I took care of this.

## 2016-04-13 NOTE — Telephone Encounter (Signed)
Wife left VM states pt's respiratory infection seems to be getting a little worse.  Pt is now coughing up green sputum and he is sleeping all day.  She is not aware of him having any fevers.  She says he just feels lousy and has "the crud." She asks if Dr. Alvy Bimler will call in Antibiotic to [facility]?

## 2016-04-16 ENCOUNTER — Other Ambulatory Visit (HOSPITAL_BASED_OUTPATIENT_CLINIC_OR_DEPARTMENT_OTHER): Payer: Medicare Other

## 2016-04-16 ENCOUNTER — Encounter: Payer: Self-pay | Admitting: Hematology and Oncology

## 2016-04-16 ENCOUNTER — Ambulatory Visit (HOSPITAL_BASED_OUTPATIENT_CLINIC_OR_DEPARTMENT_OTHER): Payer: Medicare Other | Admitting: Hematology and Oncology

## 2016-04-16 ENCOUNTER — Ambulatory Visit (HOSPITAL_COMMUNITY)
Admission: RE | Admit: 2016-04-16 | Discharge: 2016-04-16 | Disposition: A | Discharge status: Home / Self Care | Payer: Medicare Other | Source: Ambulatory Visit | Attending: Hematology and Oncology | Admitting: Hematology and Oncology

## 2016-04-16 DIAGNOSIS — C8338 Diffuse large B-cell lymphoma, lymph nodes of multiple sites: Secondary | ICD-10-CM | POA: Diagnosis not present

## 2016-04-16 DIAGNOSIS — R05 Cough: Secondary | ICD-10-CM | POA: Diagnosis not present

## 2016-04-16 DIAGNOSIS — D61818 Other pancytopenia: Secondary | ICD-10-CM | POA: Diagnosis not present

## 2016-04-16 DIAGNOSIS — R059 Cough, unspecified: Secondary | ICD-10-CM

## 2016-04-16 DIAGNOSIS — C8330 Diffuse large B-cell lymphoma, unspecified site: Secondary | ICD-10-CM

## 2016-04-16 DIAGNOSIS — J209 Acute bronchitis, unspecified: Secondary | ICD-10-CM | POA: Diagnosis not present

## 2016-04-16 DIAGNOSIS — Z8673 Personal history of transient ischemic attack (TIA), and cerebral infarction without residual deficits: Secondary | ICD-10-CM | POA: Diagnosis not present

## 2016-04-16 DIAGNOSIS — C8510 Unspecified B-cell lymphoma, unspecified site: Secondary | ICD-10-CM

## 2016-04-16 LAB — CBC WITH DIFFERENTIAL/PLATELET
BASO%: 4.6 % — AB (ref 0.0–2.0)
Basophils Absolute: 0.2 10*3/uL — ABNORMAL HIGH (ref 0.0–0.1)
EOS%: 2.5 % (ref 0.0–7.0)
Eosinophils Absolute: 0.1 10*3/uL (ref 0.0–0.5)
HEMATOCRIT: 31 % — AB (ref 38.4–49.9)
HGB: 10.5 g/dL — ABNORMAL LOW (ref 13.0–17.1)
LYMPH#: 0.7 10*3/uL — AB (ref 0.9–3.3)
LYMPH%: 20.2 % (ref 14.0–49.0)
MCH: 30.2 pg (ref 27.2–33.4)
MCHC: 33.8 g/dL (ref 32.0–36.0)
MCV: 89.4 fL (ref 79.3–98.0)
MONO#: 0.8 10*3/uL (ref 0.1–0.9)
MONO%: 22.4 % — ABNORMAL HIGH (ref 0.0–14.0)
NEUT#: 1.8 10*3/uL (ref 1.5–6.5)
NEUT%: 50.3 % (ref 39.0–75.0)
Platelets: 125 10*3/uL — ABNORMAL LOW (ref 140–400)
RBC: 3.47 10*6/uL — ABNORMAL LOW (ref 4.20–5.82)
RDW: 19.2 % — AB (ref 11.0–14.6)
WBC: 3.5 10*3/uL — AB (ref 4.0–10.3)

## 2016-04-16 LAB — COMPREHENSIVE METABOLIC PANEL
ALT: 11 U/L (ref 0–55)
AST: 11 U/L (ref 5–34)
Albumin: 3.3 g/dL — ABNORMAL LOW (ref 3.5–5.0)
Alkaline Phosphatase: 78 U/L (ref 40–150)
Anion Gap: 12 mEq/L — ABNORMAL HIGH (ref 3–11)
BUN: 18.7 mg/dL (ref 7.0–26.0)
CALCIUM: 10 mg/dL (ref 8.4–10.4)
CHLORIDE: 103 meq/L (ref 98–109)
CO2: 26 meq/L (ref 22–29)
CREATININE: 0.8 mg/dL (ref 0.7–1.3)
EGFR: 88 mL/min/{1.73_m2} — ABNORMAL LOW (ref >90)
Glucose: 122 mg/dl (ref 70–140)
Potassium: 4.2 mEq/L (ref 3.5–5.1)
Sodium: 140 mEq/L (ref 136–145)
Total Bilirubin: 0.24 mg/dL (ref 0.20–1.20)
Total Protein: 6.4 g/dL (ref 6.4–8.3)

## 2016-04-16 NOTE — Assessment & Plan Note (Signed)
This is related to recent treatment. He does not require blood transfusion. He will get 1 unit of blood if hemoglobin is less than 8 and 1 unit of platelets if he has bleeding or platelet count less than 10,000. He needs irradiated blood products.

## 2016-04-16 NOTE — Progress Notes (Signed)
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 Stable ectasia or post stenotic dilatation of the right external iliac artery measuring up to 1.5 cm. Extensive atherosclerotic disease involving the abdominal aorta with a chronic intimal flap or short segment dissection in the infrarenal abdominal aorta. Chronic occlusion of the left common iliac artery. Slowly enlarging retroperitoneal lymph nodes as described. An indolent neoplastic or lymphoproliferative process cannot be excluded      01/06/2016 Imaging    Ct abdomen and pelvis showed new extensive centrally necrotic retroperitoneal and bilateral pelvic lymphadenopathy. Differential includes lymphoproliferative condition (lymphoma/leukemia) or infectious lymphadenitis (such as due to bacterial, viral or mycobacterial etiologies). Squamous cell carcinoma metastases can have a similar appearance, although this is less likely given the location. Clinical and laboratory evaluation for a lymphoproliferative condition and tissue sampling is advised. 2. Aortic atherosclerosis. Stable 3.1 cm infrarenal abdominal aortic aneurysm      01/27/2016 Imaging    CH chest showed no acute cardiopulmonary disease. Known stable retrocrural and periaortic adenopathy. Oval density over the left neck base measuring 2.5 x 4.5 cm likely due partially to prominent internal jugular vein, although cannot exclude adenopathy in this region. Recommend neck CT with contrast. Three  vessel atherosclerotic coronary artery disease. Few small thyroid nodules with the largest measuring 1.2 cm over the left lobe. 2.4 cm left renal cyst. Sub cm right renal hypodensity unchanged and too small to characterize but likely a cyst. Minimal cholelithiasis.      01/31/2016 Imaging    ECHO showed: Left ventricle: The cavity size was normal. Systolic function was normal. The estimated ejection fraction was in the range of 60% to 65%. Wall motion was normal; there were no regional wall motion abnormalities. There was an increased relative contribution of atrial contraction to ventricular filling. Doppler parameters are consistent with abnormal left ventricular relaxation (grade 1 diastolic dysfunction).      02/07/2016 Procedure    He had imaging guided biopsy of supraclavicular lymph node      02/07/2016 Pathology Results    Accession: [id number] Core biopsies reveal diffuse areas of large atypical lymphoid cells. There are multiple cells with very large irregular nuclei or multinucleated cells. Immunohistochemistry reveals the cells are positive for CD20, CD45, bcl-2, and bcl-6. They are negative for CD10, CD30, and ALK. CD3, CD4, CD5 and CD8 reveal scattered T-cells. Ki-67 reveals an elevated proliferation rate (95%). The cells are negative for cytokeratin 7, cytokeratin 20, TTF-1, CDX-2, HMB45, SOX11, and MelanA. Overall, these findings are consistent with a diffuse large B-cell lymphoma      02/12/2016 - 02/21/2016 Hospital Admission    He was admitted to the hospital with sepsis. He received cycle 1 of chemotherapy will hospitalized      02/14/2016 Imaging    Ct abdomen and pelvis showed widespread retrocrural, retroperitoneal, and bilateral pelvic lymphadenopathy, waxing/waning when compared to recent CT, as above. Mild right hydronephrosis, likely secondary to extrinsic compression by right retroperitoneal lymphadenopathy, with  associated diminished enhancement of the right kidney.  Spleen is normal in size.      02/16/2016 Bone Marrow Biopsy    Bone marrow biopsy was negative for lymphoma involvement. Cytogenetics came back abnormal: -Y in 45% of cell lines      02/16/2016 Procedure    He had port placement      02/16/2016 - 02/21/2016 Chemotherapy    He received cycle 1 of R-EPOCH       03/08/2016 - 03/12/2016 Hospital Admission    The patient received cycle 2 of chemotherapy in the hospital      03/27/2016 PET scan    Significant decrease in size of abdominal and pelvic lymphadenopathy compared to previous CT, which shows low-grade metabolic activity. No evidence of active lymphoma within the neck or chest. 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 Continued annual follow-up by CT is recommended.       04/03/2016 - 04/07/2016 Hospital Admission    The patient received cycle 3 of chemotherapy in the hospital       INTERVAL HISTORY: Please see below for problem oriented charting. He is seen urgently today due to unresolved cough He started coughing since recent discharge from the hospital The cough is generally nonproductive He denies high-grade fever He denies chest pain or shortness of breath He was prescribed oral Augmentin on 04/13/2016. Today, his cough symptoms have improved He denies sensation of sore throat, diarrhea, dysuria, frequency or urgency  REVIEW OF SYSTEMS:   Constitutional: Denies fevers, chills or abnormal weight loss Eyes: Denies blurriness of vision Ears, nose, mouth, throat, and face: Denies mucositis or sore throat Cardiovascular: Denies palpitation, chest discomfort or lower extremity swelling Gastrointestinal:  Denies nausea, heartburn or change in bowel habits Skin: Denies abnormal skin rashes Lymphatics: Denies new lymphadenopathy or easy bruising Neurological:Denies numbness, tingling or new weaknesses Behavioral/Psych: Mood is stable, no new changes  All other systems were reviewed with the patient and are  negative.  I have reviewed the past medical history, past surgical history, social history and family history with the patient and they are unchanged from previous note.  ALLERGIES:  is allergic to bee venom; metformin and related; and sulfa drugs cross reactors.  MEDICATIONS:  Current Outpatient Prescriptions  Medication Sig Dispense Refill  . amLODipine (NORVASC) 10 MG tablet Take 10 mg by mouth daily.    Marland Kitchen amoxicillin-clavulanate (AUGMENTIN) 875-125 MG tablet Take 1 tablet by mouth 2 (two) times daily. 14 tablet 0  . atorvastatin (LIPITOR) 40 MG tablet Take 40 mg by mouth daily at 6 PM.    . lactulose (CHRONULAC) 10 GM/15ML solution Take 15 mLs (10 g total) by mouth 3 (three) times daily. 240 mL 0  . lidocaine-prilocaine (EMLA) cream Apply 1 application topically as needed. (Patient taking differently: Apply 1 application topically daily as needed (port access). ) 30 g 6  . lisinopril (PRINIVIL,ZESTRIL) 5 MG tablet Take 5 mg by mouth daily.    . ondansetron (ZOFRAN) 8 MG tablet Take 1 tablet (8 mg total) by mouth every 8 (eight) hours as needed for nausea or vomiting. (Patient not taking: Reported on 04/03/2016) 60 tablet 6  . pioglitazone (ACTOS) 30 MG tablet Take 30 mg by mouth daily.    . prochlorperazine (COMPAZINE) 10 MG tablet Take 1 tablet (10 mg total) by mouth every 6 (six) hours as needed. (Patient not taking: Reported on 04/03/2016) 60 tablet 3  . sennosides-docusate sodium (SENOKOT-S) 8.6-50 MG tablet Take 2 tablets by mouth daily.  No current facility-administered medications for this visit.     PHYSICAL EXAMINATION: ECOG PERFORMANCE STATUS: 1 - Symptomatic but completely ambulatory  Vitals:   04/16/16 0913  BP: 113/66  Pulse: (!) 107  Resp: 18  Temp: 97.5 F (36.4 C)   Filed Weights   04/16/16 0913  Weight: 185 lb 6.4 oz (84.1 kg)    GENERAL:alert, no distress and comfortable SKIN: skin color, texture, turgor are normal, no rashes or significant lesions EYES:  normal, Conjunctiva are pink and non-injected, sclera clear OROPHARYNX:no exudate, no erythema and lips, buccal mucosa, and tongue normal  NECK: supple, thyroid normal size, non-tender, without nodularity LYMPH:  no palpable lymphadenopathy in the cervical, axillary or inguinal LUNGS: clear to auscultation and percussion with normal breathing effort HEART: regular rate & rhythm and no murmurs and no lower extremity edema ABDOMEN:abdomen soft, non-tender and normal bowel sounds Musculoskeletal:no cyanosis of digits and no clubbing  NEURO: alert & oriented x 3 with fluent speech, no focal motor/sensory deficits  LABORATORY DATA:  I have reviewed the data as listed    Component Value Date/Time   NA 140 04/16/2016 0853   K 4.2 04/16/2016 0853   CL 104 04/05/2016 0403   CO2 26 04/16/2016 0853   GLUCOSE 122 04/16/2016 0853   BUN 18.7 04/16/2016 0853   CREATININE 0.8 04/16/2016 0853   CALCIUM 10.0 04/16/2016 0853   PROT 6.4 04/16/2016 0853   ALBUMIN 3.3 (L) 04/16/2016 0853   AST 11 04/16/2016 0853   ALT 11 04/16/2016 0853   ALKPHOS 78 04/16/2016 0853   BILITOT 0.24 04/16/2016 0853   GFRNONAA >60 04/05/2016 0403   GFRAA >60 04/05/2016 0403    No results found for: SPEP, UPEP  Lab Results  Component Value Date   WBC 3.5 (L) 04/16/2016   NEUTROABS 1.8 04/16/2016   HGB 10.5 (L) 04/16/2016   HCT 31.0 (L) 04/16/2016   MCV 89.4 04/16/2016   PLT 125 (L) 04/16/2016      Chemistry      Component Value Date/Time   NA 140 04/16/2016 0853   K 4.2 04/16/2016 0853   CL 104 04/05/2016 0403   CO2 26 04/16/2016 0853   BUN 18.7 04/16/2016 0853   CREATININE 0.8 04/16/2016 0853      Component Value Date/Time   CALCIUM 10.0 04/16/2016 0853   ALKPHOS 78 04/16/2016 0853   AST 11 04/16/2016 0853   ALT 11 04/16/2016 0853   BILITOT 0.24 04/16/2016 0853       RADIOGRAPHIC STUDIES: I have personally reviewed the radiological images as listed and agreed with the findings in the  report. Dg Chest 2 View  Result Date: 04/16/2016 CLINICAL DATA:  Cough 1 week.  History of lymphoma. EXAM: CHEST  2 VIEW COMPARISON:  02/12/2016 and 02/07/2012.  PET-CT 03/27/2016 FINDINGS: Right IJ Port-A-Cath has tip over the SVC just above the cavoatrial junction. Lungs are adequately inflated without focal consolidation or effusion. No evidence pneumothorax. Cardiomediastinal silhouette is within normal. 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 Remainder of the exam is unchanged. IMPRESSION: No active cardiopulmonary disease. Electronically Signed   By: Marin Olp M.D.   On: 04/16/2016 08:32   Nm Pet Image Initial (pi) Skull Base To Thigh  Result Date: 03/27/2016 CLINICAL DATA:  Initial treatment strategy for diffuse large B-cell lymphoma. Status post chemotherapy. Evaluate treatment response. EXAM: NUCLEAR MEDICINE PET SKULL BASE TO THIGH TECHNIQUE: 9.1 mCi F-18 FDG was injected intravenously. Full-ring PET imaging was  performed from the skull base to thigh after the radiotracer. CT data was obtained and used for attenuation correction and anatomic localization. FASTING BLOOD GLUCOSE:  Value: 123 mg/dl COMPARISON:  CT on 02/14/2016 FINDINGS: NECK No hypermetabolic lymph nodes in the neck. CHEST No hypermetabolic mediastinal or hilar nodes. No suspicious pulmonary nodules on the CT scan. ABDOMEN/PELVIS No abnormal hypermetabolic activity within the liver, pancreas, adrenal glands, or spleen. Significant decrease in abdominal and pelvic lymphadenopathy is demonstrated since previous study. Index retroperitoneal lymph node in the aortocaval space currently measures 11 mm on image 147/for compared to 1.6 cm previously. This has an SUV max of 3.3. Index centrally necrotic right common iliac lymphadenopathy 2.4 cm on image 162/4 compared to 3.3 cm previously. This has an SUV max of 4.7. Index lymph node in left obturator/external iliac region currently measures 1.1 cm on  image 183/4 compared to 2.9 cm previously. This has SUV max of 2.3. No new lymphadenopathy identified. Probable tiny calcified gallstones again noted, without evidence cholecystitis. Right hydronephrosis has resolved since previous study. Stable small left upper pole renal cyst. 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 Stable small fat containing left inguinal hernia . SKELETON No focal hypermetabolic activity to suggest skeletal metastasis. Diffusely increased bone marrow FDG uptake is seen, consistent with treatment response. IMPRESSION: Significant decrease in size of abdominal and pelvic lymphadenopathy compared to previous CT, which shows low-grade metabolic activity. No evidence of active lymphoma within the neck or chest. 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 Continued annual follow-up by CT is recommended. This recommendation follows ACR consensus guidelines: White Paper of the ACR Incidental Findings Committee II on Vascular Findings. J Am Coll Radiol [phone]. Electronically Signed   By: Earle Gell M.D.   On: 03/27/2016 11:07    ASSESSMENT & PLAN:  Diffuse large B-cell lymphoma of lymph nodes of multiple regions ([place]) Clinically, the patient has excellent response to treatment with minimal side-effects IT chemotherapy has to be rescheduled to the end of the week due to recent cough If the patient continued to feel well but next week, he will be readmitted to the hospital on 04/24/2016  Pancytopenia, acquired [facility]) This is related to recent treatment. He does not require blood transfusion. He will get 1 unit of blood if hemoglobin is less than 8 and 1 unit of platelets if he has bleeding or platelet count less than 10,000. He needs irradiated blood products.  Cough in adult He had recent profound cough He responded well to oral antibiotic therapy Chest x-ray is clear I recommend he completes his treatment as scheduled  H/O: CVA (cerebrovascular  accident) We discontinue Plavix from recent hospitalization Currently, he is only on 81 mg aspirin therapy We exchanged his antiplatelet agent due to anticipated IT chemotherapy   No orders of the defined types were placed in this encounter.  All questions were answered. The patient knows to call the clinic with any problems, questions or concerns. No barriers to learning was detected. I spent 20 minutes counseling the patient face to face. The total time spent in the appointment was 30 minutes and more than 50% was on counseling and review of test results     Heath Lark, MD 04/16/2016 10:22 AM

## 2016-04-16 NOTE — Assessment & Plan Note (Signed)
Clinically, the patient has excellent response to treatment with minimal side-effects IT chemotherapy has to be rescheduled to the end of the week due to recent cough If the patient continued to feel well but next week, he will be readmitted to the hospital on 04/24/2016

## 2016-04-16 NOTE — Assessment & Plan Note (Signed)
We discontinue Plavix from recent hospitalization Currently, he is only on 81 mg aspirin therapy We exchanged his antiplatelet agent due to anticipated IT chemotherapy

## 2016-04-16 NOTE — Assessment & Plan Note (Signed)
He had recent profound cough He responded well to oral antibiotic therapy Chest x-ray is clear I recommend he completes his treatment as scheduled

## 2016-04-20 ENCOUNTER — Ambulatory Visit (HOSPITAL_COMMUNITY)
Admission: RE | Admit: 2016-04-20 | Discharge: 2016-04-20 | Disposition: A | Discharge status: Home / Self Care | Payer: Medicare Other | Source: Ambulatory Visit | Attending: Hematology and Oncology | Admitting: Hematology and Oncology

## 2016-04-20 ENCOUNTER — Encounter (HOSPITAL_COMMUNITY): Payer: Self-pay

## 2016-04-20 DIAGNOSIS — Z95828 Presence of other vascular implants and grafts: Secondary | ICD-10-CM | POA: Insufficient documentation

## 2016-04-20 DIAGNOSIS — C8338 Diffuse large B-cell lymphoma, lymph nodes of multiple sites: Secondary | ICD-10-CM | POA: Insufficient documentation

## 2016-04-20 DIAGNOSIS — C8590 Non-Hodgkin lymphoma, unspecified, unspecified site: Secondary | ICD-10-CM | POA: Diagnosis not present

## 2016-04-20 DIAGNOSIS — C8599 Non-Hodgkin lymphoma, unspecified, extranodal and solid organ sites: Secondary | ICD-10-CM | POA: Diagnosis not present

## 2016-04-20 LAB — CSF CELL COUNT WITH DIFFERENTIAL
RBC COUNT CSF: 0 /mm3
TUBE #: 4
WBC CSF: 2 /mm3 (ref 0–5)

## 2016-04-20 LAB — GLUCOSE, CAPILLARY: Glucose-Capillary: 126 mg/dL — ABNORMAL HIGH (ref 65–99)

## 2016-04-20 MED ORDER — SODIUM CHLORIDE 0.9 % IJ SOLN
Freq: Once | INTRAMUSCULAR | Status: AC
  Administered 2016-04-20: 12:00:00 via INTRATHECAL
  Filled 2016-04-20: qty 0.48

## 2016-04-20 MED ORDER — LIDOCAINE HCL 1 % IJ SOLN
INTRAMUSCULAR | Status: AC
  Filled 2016-04-20: qty 20

## 2016-04-20 NOTE — Progress Notes (Signed)
Tolerated intrathecal chemo injection well. Bedrest since return from radiology at 1230.  Ready for discharge, awaiting ride home.

## 2016-04-20 NOTE — Discharge Instructions (Signed)
Methotrexate injection What is this medicine? METHOTREXATE (METH oh TREX ate) is a chemotherapy drug used to treat cancer including breast cancer, leukemia, and lymphoma. This medicine can also be used to treat psoriasis and certain kinds of arthritis. This medicine may be used for other purposes; ask your health care provider or pharmacist if you have questions. What should I tell my health care provider before I take this medicine? They need to know if you have any of these conditions: -fluid in the stomach area or lungs -if you often drink alcohol -infection or immune system problems -kidney disease -liver disease -low blood counts, like low white cell, platelet, or red cell counts -lung disease -radiation therapy -stomach ulcers -ulcerative colitis -an unusual or allergic reaction to methotrexate, other medicines, foods, dyes, or preservatives -pregnant or trying to get pregnant -breast-feeding How should I use this medicine? This medicine is for infusion into a vein or for injection into muscle or into the spinal fluid (whichever applies). It is usually given by a health care professional in a hospital or clinic setting. In rare cases, you might get this medicine at home. You will be taught how to give this medicine. Use exactly as directed. Take your medicine at regular intervals. Do not take your medicine more often than directed. If this medicine is used for arthritis or psoriasis, it should be taken weekly, NOT daily. It is important that you put your used needles and syringes in a special sharps container. Do not put them in a trash can. If you do not have a sharps container, call your pharmacist or healthcare provider to get one. Talk to your pediatrician regarding the use of this medicine in children. While this drug may be prescribed for children as young as 2 years for selected conditions, precautions do apply. Overdosage: If you think you have taken too much of this medicine  contact a poison control center or emergency room at once. NOTE: This medicine is only for you. Do not share this medicine with others. What if I miss a dose? It is important not to miss your dose. Call your doctor or health care professional if you are unable to keep an appointment. If you give yourself the medicine and you miss a dose, talk with your doctor or health care professional. Do not take double or extra doses. What may interact with this medicine? This medicine may interact with the following medications: -acitretin -aspirin or aspirin-like medicines including salicylates -azathioprine -certain antibiotics like chloramphenicol, penicillin, tetracycline -certain medicines for stomach problems like esomeprazole, omeprazole, pantoprazole -cyclosporine -gold -hydroxychloroquine -live virus vaccines -mercaptopurine -NSAIDs, medicines for pain and inflammation, like ibuprofen or naproxen -other cytotoxic agents -penicillamine -phenylbutazone -phenytoin -probenacid -retinoids such as isotretinoin and tretinoin -steroid medicines like prednisone or cortisone -sulfonamides like sulfasalazine and trimethoprim/sulfamethoxazole -theophylline This list may not describe all possible interactions. Give your health care provider a list of all the medicines, herbs, non-prescription drugs, or dietary supplements you use. Also tell them if you smoke, drink alcohol, or use illegal drugs. Some items may interact with your medicine. What should I watch for while using this medicine? Avoid alcoholic drinks. In some cases, you may be given additional medicines to help with side effects. Follow all directions for their use. This medicine can make you more sensitive to the sun. Keep out of the sun. If you cannot avoid being in the sun, wear protective clothing and use sunscreen. Do not use sun lamps or tanning beds/booths. You may get drowsy  or dizzy. Do not drive, use machinery, or do anything that  needs mental alertness until you know how this medicine affects you. Do not stand or sit up quickly, especially if you are an older patient. This reduces the risk of dizzy or fainting spells. You may need blood work done while you are taking this medicine. Call your doctor or health care professional for advice if you get a fever, chills or sore throat, or other symptoms of a cold or flu. Do not treat yourself. This drug decreases your body's ability to fight infections. Try to avoid being around people who are sick. This medicine may increase your risk to bruise or bleed. Call your doctor or health care professional if you notice any unusual bleeding. Check with your doctor or health care professional if you get an attack of severe diarrhea, nausea and vomiting, or if you sweat a lot. The loss of too much body fluid can make it dangerous for you to take this medicine. Talk to your doctor about your risk of cancer. You may be more at risk for certain types of cancers if you take this medicine. Both men and women must use effective birth control with this medicine. Do not become pregnant while taking this medicine or until at least 1 normal menstrual cycle has occurred after stopping it. Women should inform their doctor if they wish to become pregnant or think they might be pregnant. Men should not father a child while taking this medicine and for 3 months after stopping it. There is a potential for serious side effects to an unborn child. Talk to your health care professional or pharmacist for more information. Do not breast-feed an infant while taking this medicine. What side effects may I notice from receiving this medicine? Side effects that you should report to your doctor or health care professional as soon as possible: -allergic reactions like skin rash, itching or hives, swelling of the face, lips, or tongue -back pain -breathing problems or shortness of breath -confusion -diarrhea -dry,  nonproductive cough -low blood counts - this medicine may decrease the number of white blood cells, red blood cells and platelets. You may be at increased risk of infections and bleeding -mouth sores -redness, blistering, peeling or loosening of the skin, including inside the mouth -seizures -severe headaches -signs of infection - fever or chills, cough, sore throat, pain or difficulty passing urine -signs and symptoms of bleeding such as bloody or black, tarry stools; red or dark-brown urine; spitting up blood or brown material that looks like coffee grounds; red spots on the skin; unusual bruising or bleeding from the eye, gums, or nose -signs and symptoms of kidney injury like trouble passing urine or change in the amount of urine -signs and symptoms of liver injury like dark yellow or brown urine; general ill feeling or flu-like symptoms; light-colored stools; loss of appetite; nausea; right upper belly pain; unusually weak or tired; yellowing of the eyes or skin -stiff neck -vomiting Side effects that usually do not require medical attention (report to your doctor or health care professional if they continue or are bothersome): -dizziness -hair loss -headache -stomach pain -upset stomach This list may not describe all possible side effects. Call your doctor for medical advice about side effects. You may report side effects to FDA at 1-800-FDA-1088. Where should I keep my medicine? If you are using this medicine at home, you will be instructed on how to store this medicine. Throw away any unused medicine after  the expiration date on the label. NOTE: This sheet is a summary. It may not cover all possible information. If you have questions about this medicine, talk to your doctor, pharmacist, or health care provider.  2017 Elsevier/Gold Standard (2014-07-08 12:36:41)  Per Dr Carlis Abbott, radiology, may continue aspirin.   Remain flat at home as much as possible for remainder of day.

## 2016-04-23 ENCOUNTER — Other Ambulatory Visit (HOSPITAL_BASED_OUTPATIENT_CLINIC_OR_DEPARTMENT_OTHER): Payer: Medicare Other

## 2016-04-23 ENCOUNTER — Encounter: Payer: Self-pay | Admitting: Hematology and Oncology

## 2016-04-23 ENCOUNTER — Other Ambulatory Visit: Payer: Self-pay | Admitting: Hematology and Oncology

## 2016-04-23 ENCOUNTER — Ambulatory Visit (HOSPITAL_BASED_OUTPATIENT_CLINIC_OR_DEPARTMENT_OTHER): Payer: Medicare Other | Admitting: Hematology and Oncology

## 2016-04-23 ENCOUNTER — Telehealth: Payer: Self-pay | Admitting: Hematology and Oncology

## 2016-04-23 VITALS — BP 109/45 | HR 123 | Temp 98.4°F | Resp 18 | Ht 71.0 in | Wt 184.5 lb

## 2016-04-23 DIAGNOSIS — T451X5A Adverse effect of antineoplastic and immunosuppressive drugs, initial encounter: Secondary | ICD-10-CM

## 2016-04-23 DIAGNOSIS — C8338 Diffuse large B-cell lymphoma, lymph nodes of multiple sites: Secondary | ICD-10-CM

## 2016-04-23 DIAGNOSIS — D6481 Anemia due to antineoplastic chemotherapy: Secondary | ICD-10-CM

## 2016-04-23 DIAGNOSIS — C8510 Unspecified B-cell lymphoma, unspecified site: Secondary | ICD-10-CM

## 2016-04-23 DIAGNOSIS — Z8673 Personal history of transient ischemic attack (TIA), and cerebral infarction without residual deficits: Secondary | ICD-10-CM | POA: Diagnosis not present

## 2016-04-23 LAB — COMPREHENSIVE METABOLIC PANEL
ALBUMIN: 3.3 g/dL — AB (ref 3.5–5.0)
ALK PHOS: 67 U/L (ref 40–150)
ALT: 11 U/L (ref 0–55)
ANION GAP: 11 meq/L (ref 3–11)
AST: 13 U/L (ref 5–34)
BUN: 17.4 mg/dL (ref 7.0–26.0)
CALCIUM: 9.9 mg/dL (ref 8.4–10.4)
CO2: 27 mEq/L (ref 22–29)
CREATININE: 1 mg/dL (ref 0.7–1.3)
Chloride: 102 mEq/L (ref 98–109)
EGFR: 80 mL/min/{1.73_m2} — ABNORMAL LOW (ref >90)
Glucose: 130 mg/dl (ref 70–140)
POTASSIUM: 4.7 meq/L (ref 3.5–5.1)
Sodium: 140 mEq/L (ref 136–145)
Total Bilirubin: 0.23 mg/dL (ref 0.20–1.20)
Total Protein: 6.6 g/dL (ref 6.4–8.3)

## 2016-04-23 LAB — CBC WITH DIFFERENTIAL/PLATELET
BASO%: 1.4 % (ref 0.0–2.0)
BASOS ABS: 0.1 10*3/uL (ref 0.0–0.1)
EOS ABS: 0.1 10*3/uL (ref 0.0–0.5)
EOS%: 0.8 % (ref 0.0–7.0)
HEMATOCRIT: 33.5 % — AB (ref 38.4–49.9)
HGB: 11.2 g/dL — ABNORMAL LOW (ref 13.0–17.1)
LYMPH#: 0.6 10*3/uL — AB (ref 0.9–3.3)
LYMPH%: 8.9 % — ABNORMAL LOW (ref 14.0–49.0)
MCH: 30.7 pg (ref 27.2–33.4)
MCHC: 33.6 g/dL (ref 32.0–36.0)
MCV: 91.3 fL (ref 79.3–98.0)
MONO#: 0.9 10*3/uL (ref 0.1–0.9)
MONO%: 12.3 % (ref 0.0–14.0)
NEUT#: 5.4 10*3/uL (ref 1.5–6.5)
NEUT%: 76.6 % — AB (ref 39.0–75.0)
PLATELETS: 385 10*3/uL (ref 140–400)
RBC: 3.66 10*6/uL — ABNORMAL LOW (ref 4.20–5.82)
RDW: 21.7 % — ABNORMAL HIGH (ref 11.0–14.6)
WBC: 7 10*3/uL (ref 4.0–10.3)

## 2016-04-23 NOTE — Progress Notes (Signed)
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 Stable ectasia or post stenotic dilatation of the right external iliac artery measuring up to 1.5 cm. Extensive atherosclerotic disease involving the abdominal aorta with a chronic intimal flap or short segment dissection in the infrarenal abdominal aorta. Chronic occlusion of the left common iliac artery. Slowly enlarging retroperitoneal lymph nodes as described. An indolent neoplastic or lymphoproliferative process cannot be excluded      01/06/2016 Imaging    Ct abdomen and pelvis showed new extensive centrally necrotic retroperitoneal and bilateral pelvic lymphadenopathy. Differential includes lymphoproliferative condition (lymphoma/leukemia) or infectious lymphadenitis (such as due to bacterial, viral or mycobacterial etiologies). Squamous cell carcinoma metastases can have a similar appearance, although this is less likely given the location. Clinical and laboratory evaluation for a lymphoproliferative condition and tissue sampling is advised. 2. Aortic atherosclerosis. Stable 3.1 cm infrarenal abdominal aortic aneurysm      01/27/2016 Imaging    CH chest showed no acute cardiopulmonary disease. Known stable retrocrural and periaortic adenopathy. Oval density over the left neck base measuring 2.5 x 4.5 cm likely due partially to prominent internal jugular vein, although cannot exclude adenopathy in this region. Recommend neck CT with contrast. Three  vessel atherosclerotic coronary artery disease. Few small thyroid nodules with the largest measuring 1.2 cm over the left lobe. 2.4 cm left renal cyst. Sub cm right renal hypodensity unchanged and too small to characterize but likely a cyst. Minimal cholelithiasis.      01/31/2016 Imaging    ECHO showed: Left ventricle: The cavity size was normal. Systolic function was normal. The estimated ejection fraction was in the range of 60% to 65%. Wall motion was normal; there were no regional wall motion abnormalities. There was an increased relative contribution of atrial contraction to ventricular filling. Doppler parameters are consistent with abnormal left ventricular relaxation (grade 1 diastolic dysfunction).      02/07/2016 Procedure    He had imaging guided biopsy of supraclavicular lymph node      02/07/2016 Pathology Results    Accession: [id number] Core biopsies reveal diffuse areas of large atypical lymphoid cells. There are multiple cells with very large irregular nuclei or multinucleated cells. Immunohistochemistry reveals the cells are positive for CD20, CD45, bcl-2, and bcl-6. They are negative for CD10, CD30, and ALK. CD3, CD4, CD5 and CD8 reveal scattered T-cells. Ki-67 reveals an elevated proliferation rate (95%). The cells are negative for cytokeratin 7, cytokeratin 20, TTF-1, CDX-2, HMB45, SOX11, and MelanA. Overall, these findings are consistent with a diffuse large B-cell lymphoma      02/12/2016 - 02/21/2016 Hospital Admission    He was admitted to the hospital with sepsis. He received cycle 1 of chemotherapy will hospitalized      02/14/2016 Imaging    Ct abdomen and pelvis showed widespread retrocrural, retroperitoneal, and bilateral pelvic lymphadenopathy, waxing/waning when compared to recent CT, as above. Mild right hydronephrosis, likely secondary to extrinsic compression by right retroperitoneal lymphadenopathy, with  associated diminished enhancement of the right kidney.  Spleen is normal in size.      02/16/2016 Bone Marrow Biopsy    Bone marrow biopsy was negative for lymphoma involvement. Cytogenetics came back abnormal: -Y in 45% of cell lines      02/16/2016 Procedure    He had port placement      02/16/2016 - 02/21/2016 Chemotherapy    He received cycle 1 of R-EPOCH       03/08/2016 - 03/12/2016 Hospital Admission    The patient received cycle 2 of chemotherapy in the hospital      03/27/2016 PET scan    Significant decrease in size of abdominal and pelvic lymphadenopathy compared to previous CT, which shows low-grade metabolic activity. No evidence of active lymphoma within the neck or chest. 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 Continued annual follow-up by CT is recommended.       04/03/2016 - 04/07/2016 Hospital Admission    The patient received cycle 3 of chemotherapy in the hospital      04/20/2016 Procedure    He underwent successful intrathecal injection of chemotherapy without complication       INTERVAL HISTORY: Please see below for problem oriented charting. He returns for follow-up. He denies recent side effects/complication from intrathecal chemotherapy His appetite is stable Denies recent nausea or vomiting. No recent changes in bowel habits CSF analysis is still pending  REVIEW OF SYSTEMS:   Constitutional: Denies fevers, chills or abnormal weight loss Eyes: Denies blurriness of vision Ears, nose, mouth, throat, and face: Denies mucositis or sore throat Respiratory: Denies cough, dyspnea or wheezes Cardiovascular: Denies palpitation, chest discomfort or lower extremity swelling Gastrointestinal:  Denies nausea, heartburn or change in bowel habits Skin: Denies abnormal skin rashes Lymphatics: Denies new lymphadenopathy or easy bruising Neurological:Denies numbness, tingling or new weaknesses Behavioral/Psych: Mood is stable, no new changes  All other systems were reviewed with the patient and are  negative.  I have reviewed the past medical history, past surgical history, social history and family history with the patient and they are unchanged from previous note.  ALLERGIES:  is allergic to bee venom; metformin and related; and sulfa drugs cross reactors.  MEDICATIONS:  Current Outpatient Prescriptions  Medication Sig Dispense Refill  . amLODipine (NORVASC) 10 MG tablet Take 10 mg by mouth daily.    Marland Kitchen amoxicillin-clavulanate (AUGMENTIN) 875-125 MG tablet Take 1 tablet by mouth 2 (two) times daily. 14 tablet 0  . aspirin EC 81 MG tablet Take 81 mg by mouth daily.    Marland Kitchen atorvastatin (LIPITOR) 40 MG tablet Take 40 mg by mouth daily at 6 PM.    . lactulose (CHRONULAC) 10 GM/15ML solution Take 15 mLs (10 g total) by mouth 3 (three) times daily. 240 mL 0  . lidocaine-prilocaine (EMLA) cream Apply 1 application topically as needed. (Patient taking differently: Apply 1 application topically daily as needed (port access). ) 30 g 6  . lisinopril (PRINIVIL,ZESTRIL) 5 MG tablet Take 5 mg by mouth daily.    . ondansetron (ZOFRAN) 8 MG tablet Take 1 tablet (8 mg total) by mouth every 8 (eight) hours as needed for nausea or vomiting. (Patient not taking: Reported on 04/03/2016) 60 tablet 6  . pioglitazone (ACTOS) 30 MG tablet Take 30 mg by mouth daily.    . prochlorperazine (COMPAZINE) 10 MG tablet Take 1 tablet (10 mg total) by mouth every 6 (six) hours as needed. (Patient not taking: Reported on 04/03/2016) 60 tablet 3  .  sennosides-docusate sodium (SENOKOT-S) 8.6-50 MG tablet Take 2 tablets by mouth daily.     No current facility-administered medications for this visit.     PHYSICAL EXAMINATION: ECOG PERFORMANCE STATUS: 0 - Asymptomatic  Vitals:   04/23/16 1513  BP: (!) 109/45  Pulse: (!) 123  Resp: 18  Temp: 98.4 F (36.9 C)   Filed Weights   04/23/16 1513  Weight: 184 lb 8 oz (83.7 kg)    GENERAL:alert, no distress and comfortable SKIN: skin color, texture, turgor are normal, no  rashes or significant lesions EYES: normal, Conjunctiva are pink and non-injected, sclera clear OROPHARYNX:no exudate, no erythema and lips, buccal mucosa, and tongue normal  NECK: supple, thyroid normal size, non-tender, without nodularity LYMPH:  no palpable lymphadenopathy in the cervical, axillary or inguinal LUNGS: clear to auscultation and percussion with normal breathing effort HEART: regular rate & rhythm and no murmurs and no lower extremity edema ABDOMEN:abdomen soft, non-tender and normal bowel sounds Musculoskeletal:no cyanosis of digits and no clubbing  NEURO: alert & oriented x 3 with fluent speech, no focal motor/sensory deficits  LABORATORY DATA:  I have reviewed the data as listed    Component Value Date/Time   NA 140 04/23/2016 1431   K 4.7 04/23/2016 1431   CL 104 04/05/2016 0403   CO2 27 04/23/2016 1431   GLUCOSE 130 04/23/2016 1431   BUN 17.4 04/23/2016 1431   CREATININE 1.0 04/23/2016 1431   CALCIUM 9.9 04/23/2016 1431   PROT 6.6 04/23/2016 1431   ALBUMIN 3.3 (L) 04/23/2016 1431   AST 13 04/23/2016 1431   ALT 11 04/23/2016 1431   ALKPHOS 67 04/23/2016 1431   BILITOT 0.23 04/23/2016 1431   GFRNONAA >60 04/05/2016 0403   GFRAA >60 04/05/2016 0403    No results found for: SPEP, UPEP  Lab Results  Component Value Date   WBC 7.0 04/23/2016   NEUTROABS 5.4 04/23/2016   HGB 11.2 (L) 04/23/2016   HCT 33.5 (L) 04/23/2016   MCV 91.3 04/23/2016   PLT 385 04/23/2016      Chemistry      Component Value Date/Time   NA 140 04/23/2016 1431   K 4.7 04/23/2016 1431   CL 104 04/05/2016 0403   CO2 27 04/23/2016 1431   BUN 17.4 04/23/2016 1431   CREATININE 1.0 04/23/2016 1431      Component Value Date/Time   CALCIUM 9.9 04/23/2016 1431   ALKPHOS 67 04/23/2016 1431   AST 13 04/23/2016 1431   ALT 11 04/23/2016 1431   BILITOT 0.23 04/23/2016 1431       RADIOGRAPHIC STUDIES: I have personally reviewed the radiological images as listed and agreed with the  findings in the report. Dg Chest 2 View  Result Date: 04/16/2016 CLINICAL DATA:  Cough 1 week.  History of lymphoma. EXAM: CHEST  2 VIEW COMPARISON:  02/12/2016 and 02/07/2012.  PET-CT 03/27/2016 FINDINGS: Right IJ Port-A-Cath has tip over the SVC just above the cavoatrial junction. Lungs are adequately inflated without focal consolidation or effusion. No evidence pneumothorax. Cardiomediastinal silhouette is within normal. 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 Remainder of the exam is unchanged. IMPRESSION: No active cardiopulmonary disease. Electronically Signed   By: Marin Olp M.D.   On: 04/16/2016 08:32   Nm Pet Image Initial (pi) Skull Base To Thigh  Result Date: 03/27/2016 CLINICAL DATA:  Initial treatment strategy for diffuse large B-cell lymphoma. Status post chemotherapy. Evaluate treatment response. EXAM: NUCLEAR MEDICINE PET SKULL BASE TO THIGH  TECHNIQUE: 9.1 mCi F-18 FDG was injected intravenously. Full-ring PET imaging was performed from the skull base to thigh after the radiotracer. CT data was obtained and used for attenuation correction and anatomic localization. FASTING BLOOD GLUCOSE:  Value: 123 mg/dl COMPARISON:  CT on 02/14/2016 FINDINGS: NECK No hypermetabolic lymph nodes in the neck. CHEST No hypermetabolic mediastinal or hilar nodes. No suspicious pulmonary nodules on the CT scan. ABDOMEN/PELVIS No abnormal hypermetabolic activity within the liver, pancreas, adrenal glands, or spleen. Significant decrease in abdominal and pelvic lymphadenopathy is demonstrated since previous study. Index retroperitoneal lymph node in the aortocaval space currently measures 11 mm on image 147/for compared to 1.6 cm previously. This has an SUV max of 3.3. Index centrally necrotic right common iliac lymphadenopathy 2.4 cm on image 162/4 compared to 3.3 cm previously. This has an SUV max of 4.7. Index lymph node in left obturator/external iliac region currently  measures 1.1 cm on image 183/4 compared to 2.9 cm previously. This has SUV max of 2.3. No new lymphadenopathy identified. Probable tiny calcified gallstones again noted, without evidence cholecystitis. Right hydronephrosis has resolved since previous study. Stable small left upper pole renal cyst. 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 Stable small fat containing left inguinal hernia . SKELETON No focal hypermetabolic activity to suggest skeletal metastasis. Diffusely increased bone marrow FDG uptake is seen, consistent with treatment response. IMPRESSION: Significant decrease in size of abdominal and pelvic lymphadenopathy compared to previous CT, which shows low-grade metabolic activity. No evidence of active lymphoma within the neck or chest. 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 Continued annual follow-up by CT is recommended. This recommendation follows ACR consensus guidelines: White Paper of the ACR Incidental Findings Committee II on Vascular Findings. J Am Coll Radiol [phone]. Electronically Signed   By: Earle Gell M.D.   On: 03/27/2016 11:07   Dg Fluoro Guided Needle Plc Aspiration/injection Loc  Result Date: 04/20/2016 CLINICAL DATA:  Lymphoma EXAM: FLUOROSCOPICALLY GUIDED LUMBAR PUNCTURE FOR INTRATHECAL CHEMOTHERAPY TECHNIQUE: Informed consent was obtained from the patient prior to the procedure, including potential complications of headache, allergy, and pain. A 'time out' was performed. With the patient prone, the lower back was prepped with Betadine. 1% Lidocaine was used for local anesthesia. Lumbar puncture was performed at the L2-3 level using a 20 gauge needle with return of clear CSF. Pharmacy prepared methotrexate was injected into the subarachnoid space. The patient tolerated the procedure well without apparent complication. 12 mL of clear colorless CSF was obtained for laboratory studies. FLUOROSCOPY TIME:  0 minutes 12 second IMPRESSION: Intrathecal  injection of chemotherapy without complication Electronically Signed   By: Franchot Gallo M.D.   On: 04/20/2016 13:05    ASSESSMENT & PLAN:  Diffuse large B-cell lymphoma of lymph nodes of multiple regions ([place]) Clinically, the patient has excellent response to treatment with minimal side-effects He will be readmitted to the hospital on 04/24/2016 for cycle 4 of chemotherapy He will get second dose of intrathecal chemotherapy on 04/27/2016 He will return to the cancer center on 04/30/2016 for G-CSF support  H/O: CVA (cerebrovascular accident) We discontinue Plavix from recent hospitalization Currently, he is only on 81 mg aspirin therapy We exchanged his antiplatelet agent due to anticipated IT chemotherapy  Anemia due to antineoplastic chemotherapy This is likely due to recent treatment. The patient denies recent history of bleeding such as epistaxis, hematuria or hematochezia. He is asymptomatic from the anemia. I will observe for now.  He does not require  transfusion now. I will continue the chemotherapy at current dose without dosage adjustment.  If the anemia gets progressive worse in the future, I might have to delay his treatment or adjust the chemotherapy dose.    No orders of the defined types were placed in this encounter.  All questions were answered. The patient knows to call the clinic with any problems, questions or concerns. No barriers to learning was detected. I spent 20 minutes counseling the patient face to face. The total time spent in the appointment was 30 minutes and more than 50% was on counseling and review of test results     Heath Lark, MD 04/23/2016 3:52 PM

## 2016-04-23 NOTE — Progress Notes (Signed)
Bed placement called to secure inpatient bed for tomorrow morning.

## 2016-04-23 NOTE — Assessment & Plan Note (Signed)
Clinically, the patient has excellent response to treatment with minimal side-effects He will be readmitted to the hospital on 04/24/2016 for cycle 4 of chemotherapy He will get second dose of intrathecal chemotherapy on 04/27/2016 He will return to the cancer center on 04/30/2016 for G-CSF support

## 2016-04-23 NOTE — Assessment & Plan Note (Signed)
This is likely due to recent treatment. The patient denies recent history of bleeding such as epistaxis, hematuria or hematochezia. He is asymptomatic from the anemia. I will observe for now.  He does not require transfusion now. I will continue the chemotherapy at current dose without dosage adjustment.  If the anemia gets progressive worse in the future, I might have to delay his treatment or adjust the chemotherapy dose.  

## 2016-04-23 NOTE — Assessment & Plan Note (Signed)
We discontinue Plavix from recent hospitalization Currently, he is only on 81 mg aspirin therapy We exchanged his antiplatelet agent due to anticipated IT chemotherapy

## 2016-04-23 NOTE — Telephone Encounter (Signed)
Appointments scheduled per 04/23/16 los. Patient was given a copy of the appointment schedule and AVS report, per 04/23/16 los. ° °

## 2016-04-24 ENCOUNTER — Other Ambulatory Visit: Payer: Self-pay | Admitting: Hematology and Oncology

## 2016-04-24 ENCOUNTER — Inpatient Hospital Stay (HOSPITAL_COMMUNITY)
Admission: RE | Admit: 2016-04-24 | Discharge: 2016-04-28 | DRG: 847 | Disposition: A | Discharge status: Home / Self Care | Payer: Medicare Other | Source: Ambulatory Visit | Attending: Hematology and Oncology | Admitting: Hematology and Oncology

## 2016-04-24 ENCOUNTER — Encounter (HOSPITAL_COMMUNITY): Payer: Self-pay

## 2016-04-24 DIAGNOSIS — E785 Hyperlipidemia, unspecified: Secondary | ICD-10-CM

## 2016-04-24 DIAGNOSIS — I10 Essential (primary) hypertension: Secondary | ICD-10-CM

## 2016-04-24 DIAGNOSIS — K8020 Calculus of gallbladder without cholecystitis without obstruction: Secondary | ICD-10-CM | POA: Diagnosis not present

## 2016-04-24 DIAGNOSIS — Z7982 Long term (current) use of aspirin: Secondary | ICD-10-CM

## 2016-04-24 DIAGNOSIS — I2541 Coronary artery aneurysm: Secondary | ICD-10-CM

## 2016-04-24 DIAGNOSIS — K5900 Constipation, unspecified: Secondary | ICD-10-CM | POA: Diagnosis present

## 2016-04-24 DIAGNOSIS — Z8673 Personal history of transient ischemic attack (TIA), and cerebral infarction without residual deficits: Secondary | ICD-10-CM

## 2016-04-24 DIAGNOSIS — I700 Atherosclerosis of aorta: Secondary | ICD-10-CM

## 2016-04-24 DIAGNOSIS — R591 Generalized enlarged lymph nodes: Secondary | ICD-10-CM

## 2016-04-24 DIAGNOSIS — T451X5A Adverse effect of antineoplastic and immunosuppressive drugs, initial encounter: Secondary | ICD-10-CM | POA: Diagnosis present

## 2016-04-24 DIAGNOSIS — I714 Abdominal aortic aneurysm, without rupture: Secondary | ICD-10-CM

## 2016-04-24 DIAGNOSIS — Z8679 Personal history of other diseases of the circulatory system: Secondary | ICD-10-CM

## 2016-04-24 DIAGNOSIS — N1330 Unspecified hydronephrosis: Secondary | ICD-10-CM

## 2016-04-24 DIAGNOSIS — I2510 Atherosclerotic heart disease of native coronary artery without angina pectoris: Secondary | ICD-10-CM | POA: Diagnosis not present

## 2016-04-24 DIAGNOSIS — K5909 Other constipation: Secondary | ICD-10-CM

## 2016-04-24 DIAGNOSIS — D6481 Anemia due to antineoplastic chemotherapy: Secondary | ICD-10-CM | POA: Diagnosis not present

## 2016-04-24 DIAGNOSIS — C8338 Diffuse large B-cell lymphoma, lymph nodes of multiple sites: Secondary | ICD-10-CM | POA: Diagnosis not present

## 2016-04-24 DIAGNOSIS — Z8572 Personal history of non-Hodgkin lymphomas: Secondary | ICD-10-CM | POA: Diagnosis present

## 2016-04-24 DIAGNOSIS — R110 Nausea: Secondary | ICD-10-CM | POA: Diagnosis not present

## 2016-04-24 DIAGNOSIS — Z833 Family history of diabetes mellitus: Secondary | ICD-10-CM

## 2016-04-24 DIAGNOSIS — E041 Nontoxic single thyroid nodule: Secondary | ICD-10-CM | POA: Diagnosis not present

## 2016-04-24 DIAGNOSIS — N281 Cyst of kidney, acquired: Secondary | ICD-10-CM

## 2016-04-24 DIAGNOSIS — E119 Type 2 diabetes mellitus without complications: Secondary | ICD-10-CM | POA: Diagnosis present

## 2016-04-24 DIAGNOSIS — D61818 Other pancytopenia: Secondary | ICD-10-CM | POA: Diagnosis present

## 2016-04-24 DIAGNOSIS — K219 Gastro-esophageal reflux disease without esophagitis: Secondary | ICD-10-CM

## 2016-04-24 DIAGNOSIS — Z5111 Encounter for antineoplastic chemotherapy: Principal | ICD-10-CM

## 2016-04-24 DIAGNOSIS — Z801 Family history of malignant neoplasm of trachea, bronchus and lung: Secondary | ICD-10-CM

## 2016-04-24 DIAGNOSIS — C8510 Unspecified B-cell lymphoma, unspecified site: Secondary | ICD-10-CM | POA: Diagnosis not present

## 2016-04-24 DIAGNOSIS — C8330 Diffuse large B-cell lymphoma, unspecified site: Secondary | ICD-10-CM | POA: Diagnosis present

## 2016-04-24 DIAGNOSIS — Z79899 Other long term (current) drug therapy: Secondary | ICD-10-CM

## 2016-04-24 DIAGNOSIS — Z8619 Personal history of other infectious and parasitic diseases: Secondary | ICD-10-CM

## 2016-04-24 MED ORDER — DIPHENHYDRAMINE HCL 50 MG/ML IJ SOLN: 25.0000 mg | Freq: Once | INTRAMUSCULAR | Status: DC | PRN

## 2016-04-24 MED ORDER — EPINEPHRINE PF 1 MG/ML IJ SOLN
0.5000 mg | Freq: Once | INTRAMUSCULAR | Status: DC | PRN
  Filled 2016-04-24: qty 1

## 2016-04-24 MED ORDER — SODIUM CHLORIDE 0.9% FLUSH: 10.0000 mL | INTRAVENOUS | Status: DC | PRN

## 2016-04-24 MED ORDER — HEPARIN SOD (PORK) LOCK FLUSH 100 UNIT/ML IV SOLN: 500.0000 [IU] | Freq: Once | INTRAVENOUS | Status: DC | PRN

## 2016-04-24 MED ORDER — HEPARIN SOD (PORK) LOCK FLUSH 100 UNIT/ML IV SOLN: 250.0000 [IU] | Freq: Once | INTRAVENOUS | Status: DC | PRN

## 2016-04-24 MED ORDER — SODIUM CHLORIDE 0.9 % IV SOLN: Freq: Once | INTRAVENOUS | Status: DC

## 2016-04-24 MED ORDER — SODIUM CHLORIDE 0.9 % IV SOLN
INTRAVENOUS | Status: DC
  Administered 2016-04-24 – 2016-04-28 (×5): via INTRAVENOUS

## 2016-04-24 MED ORDER — ALTEPLASE 2 MG IJ SOLR
2.0000 mg | Freq: Once | INTRAMUSCULAR | Status: DC | PRN
  Filled 2016-04-24: qty 2

## 2016-04-24 MED ORDER — POLYETHYLENE GLYCOL 3350 17 G PO PACK
17.0000 g | PACK | Freq: Every day | ORAL | Status: DC
  Administered 2016-04-25 – 2016-04-28 (×3): 17 g via ORAL
  Filled 2016-04-24 (×4): qty 1

## 2016-04-24 MED ORDER — ATORVASTATIN CALCIUM 40 MG PO TABS
40.0000 mg | ORAL_TABLET | Freq: Every day | ORAL | Status: DC
  Administered 2016-04-24 – 2016-04-27 (×4): 40 mg via ORAL
  Filled 2016-04-24 (×4): qty 1

## 2016-04-24 MED ORDER — EPINEPHRINE PF 1 MG/10ML IJ SOSY: 0.2500 mg | PREFILLED_SYRINGE | Freq: Once | INTRAMUSCULAR | Status: DC | PRN

## 2016-04-24 MED ORDER — DIPHENHYDRAMINE HCL 50 MG PO CAPS
50.0000 mg | ORAL_CAPSULE | Freq: Once | ORAL | Status: AC
  Administered 2016-04-24: 50 mg via ORAL
  Filled 2016-04-24: qty 1

## 2016-04-24 MED ORDER — SODIUM CHLORIDE 0.9 % IV SOLN
Freq: Once | INTRAVENOUS | Status: AC
  Administered 2016-04-24: 8 mg via INTRAVENOUS
  Filled 2016-04-24: qty 4

## 2016-04-24 MED ORDER — HEPARIN SOD (PORK) LOCK FLUSH 100 UNIT/ML IV SOLN
500.0000 [IU] | Freq: Once | INTRAVENOUS | Status: DC | PRN
  Filled 2016-04-24: qty 5

## 2016-04-24 MED ORDER — SENNOSIDES-DOCUSATE SODIUM 8.6-50 MG PO TABS: 1.0000 | ORAL_TABLET | Freq: Every evening | ORAL | Status: DC | PRN

## 2016-04-24 MED ORDER — COLD PACK MISC ONCOLOGY
1.0000 | Freq: Once | Status: DC | PRN
  Filled 2016-04-24: qty 1

## 2016-04-24 MED ORDER — ALBUTEROL SULFATE (2.5 MG/3ML) 0.083% IN NEBU: 2.5000 mg | INHALATION_SOLUTION | Freq: Once | RESPIRATORY_TRACT | Status: DC | PRN

## 2016-04-24 MED ORDER — SODIUM CHLORIDE 0.9 % IV SOLN
375.0000 mg/m2 | Freq: Once | INTRAVENOUS | Status: AC
  Administered 2016-04-24: 800 mg via INTRAVENOUS
  Filled 2016-04-24: qty 50

## 2016-04-24 MED ORDER — DIPHENHYDRAMINE HCL 50 MG/ML IJ SOLN: 50.0000 mg | Freq: Once | INTRAMUSCULAR | Status: DC | PRN

## 2016-04-24 MED ORDER — SENNOSIDES-DOCUSATE SODIUM 8.6-50 MG PO TABS: 2.0000 | ORAL_TABLET | Freq: Every day | ORAL | Status: DC

## 2016-04-24 MED ORDER — ENOXAPARIN SODIUM 40 MG/0.4ML ~~LOC~~ SOLN
40.0000 mg | SUBCUTANEOUS | Status: DC
  Administered 2016-04-24 – 2016-04-25 (×2): 40 mg via SUBCUTANEOUS
  Filled 2016-04-24 (×2): qty 0.4

## 2016-04-24 MED ORDER — LIDOCAINE-PRILOCAINE 2.5-2.5 % EX CREA
1.0000 "application " | TOPICAL_CREAM | Freq: Every day | CUTANEOUS | Status: DC | PRN
  Filled 2016-04-24: qty 5

## 2016-04-24 MED ORDER — AMLODIPINE BESYLATE 10 MG PO TABS
10.0000 mg | ORAL_TABLET | Freq: Every day | ORAL | Status: DC
  Administered 2016-04-25 – 2016-04-28 (×4): 10 mg via ORAL
  Filled 2016-04-24 (×5): qty 1

## 2016-04-24 MED ORDER — VINCRISTINE SULFATE CHEMO INJECTION 1 MG/ML
Freq: Once | INTRAVENOUS | Status: AC
  Administered 2016-04-24: 15:00:00 via INTRAVENOUS
  Filled 2016-04-24: qty 11

## 2016-04-24 MED ORDER — METHYLPREDNISOLONE SODIUM SUCC 125 MG IJ SOLR: 125.0000 mg | Freq: Once | INTRAMUSCULAR | Status: DC | PRN

## 2016-04-24 MED ORDER — ASPIRIN EC 81 MG PO TBEC
81.0000 mg | DELAYED_RELEASE_TABLET | Freq: Every day | ORAL | Status: DC
  Administered 2016-04-25 – 2016-04-28 (×4): 81 mg via ORAL
  Filled 2016-04-24 (×6): qty 1

## 2016-04-24 MED ORDER — ALUM & MAG HYDROXIDE-SIMETH 200-200-20 MG/5ML PO SUSP: 60.0000 mL | ORAL | Status: DC | PRN

## 2016-04-24 MED ORDER — SODIUM CHLORIDE 0.9% FLUSH: 3.0000 mL | INTRAVENOUS | Status: DC | PRN

## 2016-04-24 MED ORDER — SODIUM CHLORIDE 0.9 % IV SOLN: INTRAVENOUS | Status: DC

## 2016-04-24 MED ORDER — SODIUM CHLORIDE 0.9 % IV SOLN: Freq: Once | INTRAVENOUS | Status: DC | PRN

## 2016-04-24 MED ORDER — HOT PACK MISC ONCOLOGY
1.0000 | Freq: Once | Status: DC | PRN
  Filled 2016-04-24: qty 1

## 2016-04-24 MED ORDER — ONDANSETRON HCL 4 MG PO TABS: 4.0000 mg | ORAL_TABLET | Freq: Three times a day (TID) | ORAL | Status: DC | PRN

## 2016-04-24 MED ORDER — ONDANSETRON 4 MG PO TBDP: 4.0000 mg | ORAL_TABLET | Freq: Three times a day (TID) | ORAL | Status: DC | PRN

## 2016-04-24 MED ORDER — FAMOTIDINE IN NACL 20-0.9 MG/50ML-% IV SOLN
20.0000 mg | Freq: Once | INTRAVENOUS | Status: DC | PRN
  Filled 2016-04-24: qty 50

## 2016-04-24 MED ORDER — ONDANSETRON HCL 40 MG/20ML IJ SOLN
8.0000 mg | Freq: Three times a day (TID) | INTRAMUSCULAR | Status: DC | PRN
  Filled 2016-04-24: qty 4

## 2016-04-24 MED ORDER — GUAIFENESIN-DM 100-10 MG/5ML PO SYRP: 10.0000 mL | ORAL_SOLUTION | ORAL | Status: DC | PRN

## 2016-04-24 MED ORDER — ACETAMINOPHEN 325 MG PO TABS
650.0000 mg | ORAL_TABLET | ORAL | Status: DC | PRN
  Filled 2016-04-24: qty 2

## 2016-04-24 MED ORDER — ONDANSETRON HCL 4 MG/2ML IJ SOLN: 4.0000 mg | Freq: Three times a day (TID) | INTRAMUSCULAR | Status: DC | PRN

## 2016-04-24 MED ORDER — ACETAMINOPHEN 325 MG PO TABS
650.0000 mg | ORAL_TABLET | Freq: Once | ORAL | Status: AC
  Administered 2016-04-24: 650 mg via ORAL
  Filled 2016-04-24: qty 2

## 2016-04-24 NOTE — H&P (Signed)
[facility] ADMISSION NOTE  Patient Care Team: Wenda Low, MD as PCP - General (Internal Medicine)  CHIEF COMPLAINTS/PURPOSE OF ADMISSION Diffuse large B-cell lymphoma, for inpatient chemotherapy  HISTORY OF PRESENTING ILLNESS:  Samuel Berger 71 y.o. 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 Stable ectasia or post stenotic dilatation of the right external iliac artery measuring up to 1.5 cm. Extensive atherosclerotic disease involving the abdominal aorta with a chronic intimal flap or short segment dissection in the infrarenal abdominal aorta. Chronic occlusion of the left common iliac artery. Slowly enlarging retroperitoneal lymph nodes as described. An indolent neoplastic or lymphoproliferative process cannot be excluded      01/06/2016 Imaging    Ct abdomen and pelvis showed new extensive centrally necrotic retroperitoneal and bilateral pelvic lymphadenopathy. Differential includes lymphoproliferative condition (lymphoma/leukemia) or infectious lymphadenitis (such as due to bacterial, viral or mycobacterial etiologies). Squamous cell carcinoma metastases can have a similar appearance, although this is less likely given the location. Clinical and laboratory evaluation for a lymphoproliferative condition and tissue sampling is advised. 2. Aortic atherosclerosis. Stable 3.1 cm infrarenal abdominal aortic aneurysm      01/27/2016 Imaging    [facility] chest showed no acute cardiopulmonary disease. Known stable retrocrural and  periaortic adenopathy. Oval density over the left neck base measuring 2.5 x 4.5 cm likely due partially to prominent internal jugular vein, although cannot exclude adenopathy in this region. Recommend neck CT with contrast. Three vessel atherosclerotic coronary artery disease. Few small thyroid nodules with the largest measuring 1.2 cm over the left lobe. 2.4 cm left renal cyst. Sub cm right renal hypodensity unchanged and too small to characterize but likely a cyst. Minimal cholelithiasis.      01/31/2016 Imaging    ECHO showed: Left ventricle: The cavity size was normal. Systolic function was normal. The estimated ejection fraction was in the range of 60% to 65%. Wall motion was normal; there were no regional wall motion abnormalities. There was an increased relative contribution of atrial contraction to ventricular filling. Doppler parameters are consistent with abnormal left ventricular relaxation (grade 1 diastolic dysfunction).      02/07/2016 Procedure    He had imaging guided biopsy of supraclavicular lymph node      02/07/2016 Pathology Results    Accession: [id number] Core biopsies reveal diffuse areas of large atypical lymphoid cells. There are multiple cells with very large irregular nuclei or multinucleated cells. Immunohistochemistry reveals the cells are positive for CD20, CD45, bcl-2, and bcl-6. They are negative for CD10, CD30, and ALK. CD3, CD4, CD5 and CD8 reveal scattered T-cells. Ki-67 reveals an elevated proliferation rate (95%). The cells are negative for cytokeratin 7, cytokeratin 20, TTF-1, CDX-2, HMB45, SOX11, and MelanA. Overall, these findings are consistent with a diffuse large B-cell lymphoma      02/12/2016 - 02/21/2016 Hospital Admission    He was admitted to the hospital with sepsis. He received cycle 1 of chemotherapy will hospitalized      02/14/2016 Imaging  Ct abdomen and pelvis showed widespread retrocrural, retroperitoneal, and bilateral pelvic  lymphadenopathy, waxing/waning when compared to recent CT, as above. Mild right hydronephrosis, likely secondary to extrinsic compression by right retroperitoneal lymphadenopathy, with associated diminished enhancement of the right kidney. Spleen is normal in size.      02/16/2016 Bone Marrow Biopsy    Bone marrow biopsy was negative for lymphoma involvement. Cytogenetics came back abnormal: -Y in 45% of cell lines      02/16/2016 Procedure    He had port placement      02/16/2016 - 02/21/2016 Chemotherapy    He received cycle 1 of R-EPOCH       03/08/2016 - 03/12/2016 Hospital Admission    The patient received cycle 2 of chemotherapy in the hospital      03/27/2016 PET scan    Significant decrease in size of abdominal and pelvic lymphadenopathy compared to previous CT, which shows low-grade metabolic activity. No evidence of active lymphoma within the neck or chest. 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 Continued annual follow-up by CT is recommended.       04/03/2016 - 04/07/2016 Hospital Admission    The patient received cycle 3 of chemotherapy in the hospital      04/20/2016 Procedure    He underwent successful intrathecal injection of chemotherapy without complication      He is admitted for chemotherapy. He has no new complaints MEDICAL HISTORY:  Past Medical History:  Diagnosis Date  . Aneurysm artery, iliac ([place])   . Carotid artery occlusion   . Dyslipidemia   . GERD (gastroesophageal reflux disease)   . Hepatitis    ? age 52 or 33  . Hyperlipidemia   . Hypertension   . Iliac artery aneurysm, right ([place])   . Stroke ([place])    01-05-2012   [id number]  . Type II or unspecified type diabetes mellitus without mention of complication, not stated as uncontrolled     SURGICAL HISTORY: Past Surgical History:  Procedure Laterality Date  . basket procedure    . CAROTID ENDARTERECTOMY    . ENDARTERECTOMY  02/08/2012   Procedure: ENDARTERECTOMY CAROTID;  Surgeon:  Mal Misty, MD;  Location: [facility];  Service: Vascular;  Laterality: Right;  . IR GENERIC HISTORICAL  02/07/2016   IR US GUIDE BX ASP/DRAIN 02/07/2016 Greggory Keen, MD [name]  . IR GENERIC HISTORICAL  02/16/2016   IR FLUORO GUIDE PORT INSERTION RIGHT 02/16/2016 Aletta Edouard, MD [name]  . IR GENERIC HISTORICAL  02/16/2016   IR US GUIDE VASC ACCESS RIGHT 02/16/2016 Aletta Edouard, MD [name]  . KIDNEY STONE SURGERY     removal  . PATCH ANGIOPLASTY  02/08/2012   Procedure: PATCH ANGIOPLASTY;  Surgeon: Mal Misty, MD;  Location: [facility];  Service: Vascular;  Laterality: Right;  with dacron patch angioplasty  . TEE WITHOUT CARDIOVERSION  01/08/2012   Procedure: TRANSESOPHAGEAL ECHOCARDIOGRAM (TEE);  Surgeon: Lelon Perla, MD;  Location: [facility] ENDOSCOPY;  Service: Cardiovascular;  Laterality: N/A;  . TONSILLECTOMY      SOCIAL HISTORY: Social History   Social History  . Marital status: Married    Spouse name: anne  . Number of children: 2  . Years of education: college   Occupational History  . retired    Social History Main Topics  . Smoking status: Former Smoker    Packs/day: 0.50    Years: 30.00    Types: Cigars    Quit date: 01/05/2012  . Smokeless  tobacco: Former Systems developer  . Alcohol use 1.8 oz/week    1 Glasses of wine, 1 Cans of beer, 1 Shots of liquor per week     Comment: quit 2 months..Patient drinks coffee daily.OCCASIONAL DRINKER  . Drug use: No  . Sexual activity: Not Currently   Other Topics Concern  . Not on file   Social History Narrative  . No narrative on file    FAMILY HISTORY: Family History  Problem Relation Age of Onset  . Diabetes Mother   . Lung cancer Father     ALLERGIES:  is allergic to bee venom; metformin and related; and sulfa drugs cross reactors.  MEDICATIONS:  Current Facility-Administered Medications  Medication Dose Route Frequency Provider Last Rate Last Dose  . 0.9 %  sodium chloride infusion   Intravenous  Continuous Heath Lark, MD      . acetaminophen (TYLENOL) tablet 650 mg  650 mg Oral Q4H PRN Heath Lark, MD      . alum & mag hydroxide-simeth (MAALOX/MYLANTA) 200-200-20 MG/5ML suspension 60 mL  60 mL Oral Q4H PRN Allee Busk, MD      . amLODipine (NORVASC) tablet 10 mg  10 mg Oral Daily Heath Lark, MD      . aspirin EC tablet 81 mg  81 mg Oral Daily Sheza Strickland, MD      . atorvastatin (LIPITOR) tablet 40 mg  40 mg Oral q1800 Fady Stamps, MD      . enoxaparin (LOVENOX) injection 40 mg  40 mg Subcutaneous Q24H Rohit Deloria, MD      . guaiFENesin-dextromethorphan (ROBITUSSIN DM) 100-10 MG/5ML syrup 10 mL  10 mL Oral Q4H PRN Heath Lark, MD      . lidocaine-prilocaine (EMLA) cream 1 application  1 application Topical Daily PRN Amare Bail, MD      . ondansetron (ZOFRAN) tablet 4-8 mg  4-8 mg Oral Q8H PRN Heath Lark, MD       Or  . ondansetron (ZOFRAN-ODT) disintegrating tablet 4-8 mg  4-8 mg Oral Q8H PRN Heath Lark, MD       Or  . ondansetron (ZOFRAN) injection 4 mg  4 mg Intravenous Q8H PRN Heath Lark, MD       Or  . ondansetron (ZOFRAN) 8 mg in sodium chloride 0.9 % 50 mL IVPB  8 mg Intravenous Q8H PRN Sanae Willetts, MD      . senna-docusate (Senokot-S) tablet 1 tablet  1 tablet Oral QHS PRN Heath Lark, MD      . sennosides-docusate sodium (SENOKOT-S) 8.6-50 MG tablet 2 tablet  2 tablet Oral Daily Yanuel Tagg, MD        REVIEW OF SYSTEMS:   Constitutional: Denies fevers, chills or abnormal night sweats Eyes: Denies blurriness of vision, double vision or watery eyes Ears, nose, mouth, throat, and face: Denies mucositis or sore throat Respiratory: Denies cough, dyspnea or wheezes Cardiovascular: Denies palpitation, chest discomfort or lower extremity swelling Gastrointestinal:  Denies nausea, heartburn or change in bowel habits Skin: Denies abnormal skin rashes Lymphatics: Denies new lymphadenopathy or easy bruising Neurological:Denies numbness, tingling or new weaknesses Behavioral/Psych: Mood is  stable, no new changes  All other systems were reviewed with the patient and are negative.  PHYSICAL EXAMINATION: ECOG PERFORMANCE STATUS: 0 - Asymptomatic GENERAL:alert, no distress and comfortable SKIN: skin color, texture, turgor are normal, no rashes or significant lesions EYES: normal, conjunctiva are pink and non-injected, sclera clear OROPHARYNX:no exudate, no erythema and lips, buccal mucosa, and tongue normal  NECK: supple,  thyroid normal size, non-tender, without nodularity LYMPH:  no palpable lymphadenopathy in the cervical, axillary or inguinal LUNGS: clear to auscultation and percussion with normal breathing effort HEART: regular rate & rhythm and no murmurs and no lower extremity edema ABDOMEN:abdomen soft, non-tender and normal bowel sounds Musculoskeletal:no cyanosis of digits and no clubbing  PSYCH: alert & oriented x 3 with fluent speech NEURO: no focal motor/sensory deficits  LABORATORY DATA:  I have reviewed the data as listed Lab Results  Component Value Date   WBC 7.0 04/23/2016   HGB 11.2 (L) 04/23/2016   HCT 33.5 (L) 04/23/2016   MCV 91.3 04/23/2016   PLT 385 04/23/2016    Recent Labs  03/11/16 0411 03/12/16 0420  04/05/16 0403 04/16/16 0853 04/23/16 1431  NA 136 136  < > 138 140 140  K 4.8 4.0  < > 4.1 4.2 4.7  CL 100* 100*  --  104  --   --   CO2 29 30  < > 26 26 27   GLUCOSE 160* 151*  < > 160* 122 130  BUN 18 19  < > 20 18.7 17.4  CREATININE 0.71 0.62  < > 0.66 0.8 1.0  CALCIUM 9.0 8.6*  < > 8.7* 10.0 9.9  GFRNONAA >60 >60  --  >60  --   --   GFRAA >60 >60  --  >60  --   --   PROT 5.8* 5.5*  < > 5.7* 6.4 6.6  ALBUMIN 3.2* 2.9*  < > 3.2* 3.3* 3.3*  AST 14* 17  < > 17 11 13   ALT 15* 16*  < > 10* 11 11  ALKPHOS 47 44  < > 42 78 67  BILITOT 0.6 0.6  < > 0.2* 0.24 0.23  < > = values in this interval not displayed.  RADIOGRAPHIC STUDIES: I have personally reviewed the radiological images as listed and agreed with the findings in the  report. Dg Chest 2 View  Result Date: 04/16/2016 CLINICAL DATA:  Cough 1 week.  History of lymphoma. EXAM: CHEST  2 VIEW COMPARISON:  02/12/2016 and 02/07/2012.  PET-CT 03/27/2016 FINDINGS: Right IJ Port-A-Cath has tip over the SVC just above the cavoatrial junction. Lungs are adequately inflated without focal consolidation or effusion. No evidence pneumothorax. Cardiomediastinal silhouette is within normal. 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 Remainder of the exam is unchanged. IMPRESSION: No active cardiopulmonary disease. Electronically Signed   By: Marin Olp M.D.   On: 04/16/2016 08:32   Nm Pet Image Initial (pi) Skull Base To Thigh  Result Date: 03/27/2016 CLINICAL DATA:  Initial treatment strategy for diffuse large B-cell lymphoma. Status post chemotherapy. Evaluate treatment response. EXAM: NUCLEAR MEDICINE PET SKULL BASE TO THIGH TECHNIQUE: 9.1 mCi F-18 FDG was injected intravenously. Full-ring PET imaging was performed from the skull base to thigh after the radiotracer. CT data was obtained and used for attenuation correction and anatomic localization. FASTING BLOOD GLUCOSE:  Value: 123 mg/dl COMPARISON:  CT on 02/14/2016 FINDINGS: NECK No hypermetabolic lymph nodes in the neck. CHEST No hypermetabolic mediastinal or hilar nodes. No suspicious pulmonary nodules on the CT scan. ABDOMEN/PELVIS No abnormal hypermetabolic activity within the liver, pancreas, adrenal glands, or spleen. Significant decrease in abdominal and pelvic lymphadenopathy is demonstrated since previous study. Index retroperitoneal lymph node in the aortocaval space currently measures 11 mm on image 147/for compared to 1.6 cm previously. This has an SUV max of 3.3. Index centrally necrotic right common iliac lymphadenopathy  2.4 cm on image 162/4 compared to 3.3 cm previously. This has an SUV max of 4.7. Index lymph node in left obturator/external iliac region currently measures 1.1 cm on  image 183/4 compared to 2.9 cm previously. This has SUV max of 2.3. No new lymphadenopathy identified. Probable tiny calcified gallstones again noted, without evidence cholecystitis. Right hydronephrosis has resolved since previous study. Stable small left upper pole renal cyst. 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 Stable small fat containing left inguinal hernia . SKELETON No focal hypermetabolic activity to suggest skeletal metastasis. Diffusely increased bone marrow FDG uptake is seen, consistent with treatment response. IMPRESSION: Significant decrease in size of abdominal and pelvic lymphadenopathy compared to previous CT, which shows low-grade metabolic activity. No evidence of active lymphoma within the neck or chest. 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 Continued annual follow-up by CT is recommended. This recommendation follows ACR consensus guidelines: White Paper of the ACR Incidental Findings Committee II on Vascular Findings. J Am Coll Radiol [phone]. Electronically Signed   By: Earle Gell M.D.   On: 03/27/2016 11:07   Dg Fluoro Guided Needle Plc Aspiration/injection Loc  Result Date: 04/20/2016 CLINICAL DATA:  Lymphoma EXAM: FLUOROSCOPICALLY GUIDED LUMBAR PUNCTURE FOR INTRATHECAL CHEMOTHERAPY TECHNIQUE: Informed consent was obtained from the patient prior to the procedure, including potential complications of headache, allergy, and pain. A 'time out' was performed. With the patient prone, the lower back was prepped with Betadine. 1% Lidocaine was used for local anesthesia. Lumbar puncture was performed at the L2-3 level using a 20 gauge needle with return of clear CSF. Pharmacy prepared methotrexate was injected into the subarachnoid space. The patient tolerated the procedure well without apparent complication. 12 mL of clear colorless CSF was obtained for laboratory studies. FLUOROSCOPY TIME:  0 minutes 12 second IMPRESSION: Intrathecal injection of  chemotherapy without complication Electronically Signed   By: Franchot Gallo M.D.   On: 04/20/2016 13:05    ASSESSMENT & PLAN:   Diffuse large B-cell lymphoma of lymph nodes of multiple regions ([place]) Clinically, the patient has excellent response to treatment with minimal side-effects He is readmitted to the hospital on 04/24/2016 for cycle 4 of chemotherapy He will get second dose of intrathecal chemotherapy on 04/27/2016 He will return to the cancer center on 04/30/2016 for G-CSF support  H/O: CVA (cerebrovascular accident) We discontinue Plavix from recent hospitalization Currently, he is only on 81 mg aspirin therapy We exchanged his antiplatelet agent due to anticipated IT chemotherapy I will hold Lovenox on Thursday, January 25 in anticipation for IT methotrexate  Anemia due to antineoplastic chemotherapy This is likely due to recent treatment. The patient denies recent history of bleeding such as epistaxis, hematuria or hematochezia. He is asymptomatic from the anemia. I will observe for now.  He does not require transfusion now. I will continue the chemotherapy at current dose without dosage adjustment.  If the anemia gets progressive worse in the future, I might have to delay his treatment or adjust the chemotherapy dose.  DVT prophylaxis We will hold on January 25 in anticipation for IT methotrexate on January 26  History of severe constipation Continue regular laxatives therapy  CODE STATUS Full code  Discharge planning At the end of the week when chemotherapy is completed     Heath Lark, MD 04/24/2016 8:12 AM

## 2016-04-24 NOTE — Progress Notes (Signed)
Adriamycin, Etoposide and Oncovin doses and dilutions verified with Reyne Dumas.

## 2016-04-24 NOTE — Progress Notes (Signed)
Rituxan dose and dilution verified with Reyne Dumas.

## 2016-04-25 DIAGNOSIS — D6481 Anemia due to antineoplastic chemotherapy: Secondary | ICD-10-CM

## 2016-04-25 DIAGNOSIS — K5900 Constipation, unspecified: Secondary | ICD-10-CM

## 2016-04-25 MED ORDER — ONDANSETRON HCL 40 MG/20ML IJ SOLN
Freq: Once | INTRAMUSCULAR | Status: AC
  Administered 2016-04-25: 18 mg via INTRAVENOUS
  Filled 2016-04-25: qty 4

## 2016-04-25 MED ORDER — VINCRISTINE SULFATE CHEMO INJECTION 1 MG/ML
Freq: Once | INTRAVENOUS | Status: AC
  Administered 2016-04-25: 15:00:00 via INTRAVENOUS
  Filled 2016-04-25: qty 11

## 2016-04-25 MED ORDER — SENNOSIDES-DOCUSATE SODIUM 8.6-50 MG PO TABS
2.0000 | ORAL_TABLET | Freq: Two times a day (BID) | ORAL | Status: DC
  Administered 2016-04-25 – 2016-04-28 (×6): 2 via ORAL
  Filled 2016-04-25 (×7): qty 2

## 2016-04-25 NOTE — Progress Notes (Signed)
Dosing and dilution verified for Etoposide, Adriamycin and Oncovin with Laural Benes, RN.

## 2016-04-25 NOTE — Progress Notes (Signed)
Samuel Berger   DOB:1945-12-29   [id number]    Subjective: He tolerated cycle 4 today to treatment well. Denies nausea, constipation or other side effects of treatment  Objective:  Vitals:   04/25/16 0131 04/25/16 0515  BP: 112/69 120/63  Pulse: 84 88  Resp: 16 18  Temp: 97.9 F (36.6 C) 98 F (36.7 C)     Intake/Output Summary (Last 24 hours) at 04/25/16 [id number] Last data filed at 04/25/16 0516  Gross per 24 hour  Intake             1420 ml  Output                0 ml  Net             1420 ml    GENERAL:alert, no distress and comfortable SKIN: skin color, texture, turgor are normal, no rashes or significant lesions EYES: normal, Conjunctiva are pink and non-injected, sclera clear OROPHARYNX:no exudate, no erythema and lips, buccal mucosa, and tongue normal  NECK: supple, thyroid normal size, non-tender, without nodularity LYMPH:  no palpable lymphadenopathy in the cervical, axillary or inguinal LUNGS: clear to auscultation and percussion with normal breathing effort HEART: regular rate & rhythm and no murmurs and no lower extremity edema ABDOMEN:abdomen soft, non-tender and normal bowel sounds Musculoskeletal:no cyanosis of digits and no clubbing  NEURO: alert & oriented x 3 with fluent speech, no focal motor/sensory deficits   Labs:  Lab Results  Component Value Date   WBC 7.0 04/23/2016   HGB 11.2 (L) 04/23/2016   HCT 33.5 (L) 04/23/2016   MCV 91.3 04/23/2016   PLT 385 04/23/2016   NEUTROABS 5.4 04/23/2016    Lab Results  Component Value Date   NA 140 04/23/2016   K 4.7 04/23/2016   CL 104 04/05/2016   CO2 27 04/23/2016    Assessment & Plan:   Diffuse large B-cell lymphoma of lymph nodes of multiple regions ([facility]) Clinically, the patient has excellent response to treatment with minimal side-effects He is readmitted to the hospital on 04/24/2016 for cycle 4 of chemotherapy He will get second dose of intrathecal chemotherapy on 04/27/2016 He will return  to the cancer center on 04/30/2016 for G-CSF support  H/O: CVA (cerebrovascular accident) We discontinue Plavix from recent hospitalization Currently, he is only on 81 mg aspirin therapy We exchanged his antiplatelet agent due to anticipated IT chemotherapy I will hold Lovenox on Thursday, January 25 in anticipation for IT methotrexate  Anemia due to antineoplastic chemotherapy This is likely due to recent treatment. The patient denies recent history of bleeding such as epistaxis, hematuria or hematochezia. Heis asymptomatic from the anemia. I will observe for now. Hedoes not require transfusion now. I will continue the chemotherapy at current dose without dosage adjustment. If the anemia gets progressive worse in the future, I might have to delay histreatment or adjust the chemotherapy dose.  DVT prophylaxis We will hold on January 25 in anticipation for IT methotrexate on January 26  History of severe constipation Continue regular laxatives therapy  CODE STATUS Full code  Discharge planning At the end of the week when chemotherapy is completed  Heath Lark, MD 04/25/2016  9:21 AM

## 2016-04-26 LAB — COMPREHENSIVE METABOLIC PANEL
ALK PHOS: 44 U/L (ref 38–126)
ALT: 12 U/L — AB (ref 17–63)
ANION GAP: 7 (ref 5–15)
AST: 18 U/L (ref 15–41)
Albumin: 3.3 g/dL — ABNORMAL LOW (ref 3.5–5.0)
BILIRUBIN TOTAL: 0.4 mg/dL (ref 0.3–1.2)
BUN: 19 mg/dL (ref 6–20)
CALCIUM: 8.9 mg/dL (ref 8.9–10.3)
CO2: 26 mmol/L (ref 22–32)
CREATININE: 0.54 mg/dL — AB (ref 0.61–1.24)
Chloride: 104 mmol/L (ref 101–111)
GFR calc non Af Amer: >60 mL/min (ref >60)
GLUCOSE: 178 mg/dL — AB (ref 65–99)
Potassium: 4.4 mmol/L (ref 3.5–5.1)
SODIUM: 137 mmol/L (ref 135–145)
TOTAL PROTEIN: 6 g/dL — AB (ref 6.5–8.1)

## 2016-04-26 LAB — CBC WITH DIFFERENTIAL/PLATELET
Basophils Absolute: 0 10*3/uL (ref 0.0–0.1)
Basophils Relative: 0 %
EOS ABS: 0 10*3/uL (ref 0.0–0.7)
Eosinophils Relative: 0 %
HEMATOCRIT: 29 % — AB (ref 39.0–52.0)
Hemoglobin: 9.7 g/dL — ABNORMAL LOW (ref 13.0–17.0)
LYMPHS ABS: 0.5 10*3/uL — AB (ref 0.7–4.0)
LYMPHS PCT: 4 %
MCH: 30.3 pg (ref 26.0–34.0)
MCHC: 33.4 g/dL (ref 30.0–36.0)
MCV: 90.6 fL (ref 78.0–100.0)
Monocytes Absolute: 1.3 10*3/uL — ABNORMAL HIGH (ref 0.1–1.0)
Monocytes Relative: 11 %
NEUTROS ABS: 10 10*3/uL — AB (ref 1.7–7.7)
NEUTROS PCT: 85 %
Platelets: 319 10*3/uL (ref 150–400)
RBC: 3.2 MIL/uL — AB (ref 4.22–5.81)
RDW: 19.6 % — ABNORMAL HIGH (ref 11.5–15.5)
WBC: 11.8 10*3/uL — ABNORMAL HIGH (ref 4.0–10.5)

## 2016-04-26 MED ORDER — VINCRISTINE SULFATE CHEMO INJECTION 1 MG/ML
Freq: Once | INTRAVENOUS | Status: AC
  Administered 2016-04-26: 13:00:00 via INTRAVENOUS
  Filled 2016-04-26: qty 11

## 2016-04-26 MED ORDER — ONDANSETRON HCL 40 MG/20ML IJ SOLN
Freq: Once | INTRAMUSCULAR | Status: AC
  Administered 2016-04-26: 18 mg via INTRAVENOUS
  Filled 2016-04-26: qty 4

## 2016-04-26 NOTE — Progress Notes (Signed)
Chemo dosages and dilutions verified with Desiree Lucy, RN.

## 2016-04-26 NOTE — Progress Notes (Signed)
Samuel Berger   DOB:11/05/1945   [id number]    Subjective: He tolerated chemotherapy very well. He denies problem with constipation. No nausea or side effects from treatment  Objective:  Vitals:   04/25/16 2041 04/26/16 0501  BP: 111/77 127/61  Pulse: 94 69  Resp: 16 18  Temp: 98.9 F (37.2 C) 98.2 F (36.8 C)     Intake/Output Summary (Last 24 hours) at 04/26/16 [id number] Last data filed at 04/26/16 0615  Gross per 24 hour  Intake             1310 ml  Output                0 ml  Net             1310 ml    GENERAL:alert, no distress and comfortable SKIN: skin color, texture, turgor are normal, no rashes or significant lesions EYES: normal, Conjunctiva are pink and non-injected, sclera clear OROPHARYNX:no exudate, no erythema and lips, buccal mucosa, and tongue normal  NECK: supple, thyroid normal size, non-tender, without nodularity LYMPH:  no palpable lymphadenopathy in the cervical, axillary or inguinal LUNGS: clear to auscultation and percussion with normal breathing effort HEART: regular rate & rhythm and no murmurs and no lower extremity edema ABDOMEN:abdomen soft, non-tender and normal bowel sounds Musculoskeletal:no cyanosis of digits and no clubbing  NEURO: alert & oriented x 3 with fluent speech, no focal motor/sensory deficits   Labs:  Lab Results  Component Value Date   WBC 11.8 (H) 04/26/2016   HGB 9.7 (L) 04/26/2016   HCT 29.0 (L) 04/26/2016   MCV 90.6 04/26/2016   PLT 319 04/26/2016   NEUTROABS 10.0 (H) 04/26/2016    Lab Results  Component Value Date   NA 137 04/26/2016   K 4.4 04/26/2016   CL 104 04/26/2016   CO2 26 04/26/2016    Assessment & Plan:   Diffuse large B-cell lymphoma of lymph nodes of multiple regions ([place]) Clinically, the patient has excellent response to treatment with minimal side-effects He isreadmitted to the hospital on 04/24/2016 for cycle 4 of chemotherapy He will get second dose of intrathecal chemotherapy on  04/27/2016 He will return to the cancer center on 04/30/2016 for G-CSF support  H/O: CVA (cerebrovascular accident) We discontinue Plavix from recent hospitalization Currently, he is only on 81 mg aspirin therapy We exchanged his antiplatelet agent due to anticipated IT chemotherapy I will hold Lovenox today, January 25 in anticipation for IT methotrexate  Anemia due to antineoplastic chemotherapy This is likely due to recent treatment. The patient denies recent history of bleeding such as epistaxis, hematuria or hematochezia. Heis asymptomatic from the anemia. I will observe for now. Hedoes not require transfusion now. I will continue the chemotherapy at current dose without dosage adjustment. If the anemia gets progressive worse in the future, I might have to delay histreatment or adjust the chemotherapy dose.  DVT prophylaxis We will hold on January 25 in anticipation for IT methotrexate on January 26  History of severe constipation Continue regular laxatives therapy  CODE STATUS Full code  Discharge planning At the end of the week when chemotherapy is completed   Heath Lark, MD 04/26/2016  9:23 AM

## 2016-04-27 ENCOUNTER — Other Ambulatory Visit: Payer: Self-pay | Admitting: *Deleted

## 2016-04-27 ENCOUNTER — Inpatient Hospital Stay (HOSPITAL_COMMUNITY): Payer: Medicare Other

## 2016-04-27 ENCOUNTER — Telehealth: Payer: Self-pay | Admitting: *Deleted

## 2016-04-27 DIAGNOSIS — C8338 Diffuse large B-cell lymphoma, lymph nodes of multiple sites: Secondary | ICD-10-CM

## 2016-04-27 MED ORDER — ETOPOSIDE CHEMO INJECTION 500 MG/25ML
Freq: Once | INTRAVENOUS | Status: AC
  Administered 2016-04-27: 13:00:00 via INTRAVENOUS
  Filled 2016-04-27: qty 11

## 2016-04-27 MED ORDER — SODIUM CHLORIDE 0.9 % IJ SOLN
Freq: Once | INTRAMUSCULAR | Status: DC
  Filled 2016-04-27: qty 0.48

## 2016-04-27 MED ORDER — LIDOCAINE HCL 1 % IJ SOLN
INTRAMUSCULAR | Status: AC
  Filled 2016-04-27: qty 20

## 2016-04-27 MED ORDER — ONDANSETRON HCL 40 MG/20ML IJ SOLN
Freq: Once | INTRAMUSCULAR | Status: AC
  Administered 2016-04-27: 8 mg via INTRAVENOUS
  Filled 2016-04-27: qty 4

## 2016-04-27 NOTE — Progress Notes (Signed)
Chemo dosages and calculations checked with Porfirio Mylar RN, another chemo certified nurse.

## 2016-04-27 NOTE — Progress Notes (Signed)
Samuel Berger   DOB:1946-03-28   [id number]    Subjective: He tolerated cycle 4 day 4 chemo well. No nausea, or other side-effects from treatment.  Objective:  Vitals:   04/26/16 2150 04/27/16 0434  BP: 114/69 (!) 122/54  Pulse: 94 66  Resp: 16 16  Temp: 97.9 F (36.6 C) 98.1 F (36.7 C)     Intake/Output Summary (Last 24 hours) at 04/27/16 [id number] Last data filed at 04/27/16 0543  Gross per 24 hour  Intake          2199.77 ml  Output                0 ml  Net          2199.77 ml    GENERAL:alert, no distress and comfortable SKIN: skin color, texture, turgor are normal, no rashes or significant lesions EYES: normal, Conjunctiva are pink and non-injected, sclera clear OROPHARYNX:no exudate, no erythema and lips, buccal mucosa, and tongue normal  NECK: supple, thyroid normal size, non-tender, without nodularity LYMPH:  no palpable lymphadenopathy in the cervical, axillary or inguinal LUNGS: clear to auscultation and percussion with normal breathing effort HEART: regular rate & rhythm and no murmurs and no lower extremity edema ABDOMEN:abdomen soft, non-tender and normal bowel sounds Musculoskeletal:no cyanosis of digits and no clubbing  NEURO: alert & oriented x 3 with fluent speech, no focal motor/sensory deficits   Labs:  Lab Results  Component Value Date   WBC 11.8 (H) 04/26/2016   HGB 9.7 (L) 04/26/2016   HCT 29.0 (L) 04/26/2016   MCV 90.6 04/26/2016   PLT 319 04/26/2016   NEUTROABS 10.0 (H) 04/26/2016    Lab Results  Component Value Date   NA 137 04/26/2016   K 4.4 04/26/2016   CL 104 04/26/2016   CO2 26 04/26/2016    Assessment & Plan:   Diffuse large B-cell lymphoma of lymph nodes of multiple regions (HCC) Clinically, the patient has excellent response to treatment with minimal side-effects He isreadmitted to the hospital on 04/24/2016 for cycle 4 of chemotherapy He will get second dose of intrathecal chemotherapy today He will return to the cancer  center on 04/30/2016 for G-CSF support  H/O: CVA (cerebrovascular accident) We discontinue Plavix from recent hospitalization Currently, he is only on 81 mg aspirin therapy We exchanged his antiplatelet agent due to anticipated IT chemotherapy I will hold Lovenox in anticipation for IT methotrexate  Anemia due to antineoplastic chemotherapy This is likely due to recent treatment. The patient denies recent history of bleeding such as epistaxis, hematuria or hematochezia. Heis asymptomatic from the anemia. I will observe for now. Hedoes not require transfusion now. I will continue the chemotherapy at current dose without dosage adjustment. If the anemia gets progressive worse in the future, I might have to delay histreatment or adjust the chemotherapy dose.  DVT prophylaxis We will hold on in anticipation for IT methotrexate today  History of severe constipation Continue regular laxatives therapy  CODE STATUS Full code  Discharge planning Tomorrow when chemotherapy is completed   Heath Lark, MD 04/27/2016  6:14 AM

## 2016-04-27 NOTE — Telephone Encounter (Signed)
Radiology Tech called to notify Dr. Alvy Bimler that IT MTX was not done today.  Apparently, pt felt uneasy during the procedure and requested Radiologist to stop before Mtx was administered.   She says pt requested to have procedure rescheduled to when Dr. Carlis Abbott is available.  Dr. Carlis Abbott performed pt's previous IT chemo.

## 2016-04-27 NOTE — Progress Notes (Signed)
X-ray tech let this nurse know that they were unable to get access for intrathecal chemo.  Stated they were going to let Dr Alvy Bimler know.  Patient was brought back to his room

## 2016-04-28 MED ORDER — SODIUM CHLORIDE 0.9 % IV SOLN
Freq: Once | INTRAVENOUS | Status: AC
  Administered 2016-04-28: 16 mg via INTRAVENOUS
  Filled 2016-04-28: qty 8

## 2016-04-28 MED ORDER — SODIUM CHLORIDE 0.9 % IV SOLN
750.0000 mg/m2 | Freq: Once | INTRAVENOUS | Status: AC
  Administered 2016-04-28: 1600 mg via INTRAVENOUS
  Filled 2016-04-28: qty 80

## 2016-04-28 MED ORDER — HEPARIN SOD (PORK) LOCK FLUSH 100 UNIT/ML IV SOLN: 500.0000 [IU] | Freq: Once | INTRAVENOUS | Status: DC

## 2016-04-28 NOTE — Discharge Summary (Signed)
Physician Discharge Summary  Patient ID: Samuel Berger MRN: [id number] DOB/AGE: Jan 16, 1946 71 y.o.  Admit date: 04/24/2016 Discharge date: 04/28/2016  Primary Care Physician:  Wenda Low, MD   Discharge Diagnoses:    Present on Admission: . Diffuse large B-cell lymphoma of lymph nodes of multiple regions ([place]) . Pancytopenia, acquired ([place]) . Diffuse large B cell lymphoma ([place])   Discharge Medications:  Allergies as of 04/28/2016      Reactions   Bee Venom Hives   Metformin And Related    dizziness   Sulfa Drugs Cross Reactors Other (See Comments)   Unknown      Medication List    TAKE these medications   amLODipine 10 MG tablet Commonly known as:  NORVASC Take 10 mg by mouth every morning.   aspirin EC 81 MG tablet Take 81 mg by mouth every morning.   atorvastatin 40 MG tablet Commonly known as:  LIPITOR Take 40 mg by mouth every evening.   lactulose 10 GM/15ML solution Commonly known as:  CHRONULAC Take 15 mLs (10 g total) by mouth 3 (three) times daily.   lidocaine-prilocaine cream Commonly known as:  EMLA Apply 1 application topically as needed. What changed:  when to take this  reasons to take this   ondansetron 8 MG tablet Commonly known as:  ZOFRAN Take 1 tablet (8 mg total) by mouth every 8 (eight) hours as needed for nausea or vomiting.   pioglitazone 30 MG tablet Commonly known as:  ACTOS Take 30 mg by mouth every morning.   prochlorperazine 10 MG tablet Commonly known as:  COMPAZINE Take 1 tablet (10 mg total) by mouth every 6 (six) hours as needed.   sennosides-docusate sodium 8.6-50 MG tablet Commonly known as:  SENOKOT-S Take 2 tablets by mouth 2 (two) times daily.       Disposition and Follow-up:   Significant Diagnostic Studies:  Dg Chest 2 View  Result Date: 04/16/2016 CLINICAL DATA:  Cough 1 week.  History of lymphoma. EXAM: CHEST  2 VIEW COMPARISON:  02/12/2016 and 02/07/2012.  PET-CT 03/27/2016 FINDINGS:  Right IJ Port-A-Cath has tip over the SVC just above the cavoatrial junction. Lungs are adequately inflated without focal consolidation or effusion. No evidence pneumothorax. Cardiomediastinal silhouette is within normal. 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 Remainder of the exam is unchanged. IMPRESSION: No active cardiopulmonary disease. Electronically Signed   By: Marin Olp M.D.   On: 04/16/2016 08:32   Dg Fluoro Guided Needle Plc Aspiration/injection Loc  Result Date: 04/27/2016 CLINICAL DATA:  71 year old male with history of diffuse large B-cell lymphoma. EXAM: DIAGNOSTIC LUMBAR PUNCTURE UNDER FLUOROSCOPIC GUIDANCE FLUOROSCOPY TIME:  Fluoroscopy Time:  48 seconds Radiation Exposure Index (if provided by the fluoroscopic device): 5.10 mGy PROCEDURE: Informed consent was obtained from the patient prior to the procedure, including potential complications of headache, allergy, and pain. With the patient prone, the lower back was prepped with Betadine. 1% Lidocaine was used for local anesthesia. Lumbar puncture was attempted at the L4-L5 level using a 20 gauge needle. Repeated attempts at this level or made to access CSF, but were unsuccessful. Further attempts were planned at higher levels and discussed with the patient, however, the patient experienced nausea during a procedure and indicated that he was unwilling to undergo further attempts. The procedure was terminated at that time. IMPRESSION: Unsuccessful attempted lumbar puncture for intrathecal chemotherapy injection, as above. Patient will be rescheduled as an outpatient to return for additional attempts. Electronically  Signed   By: Vinnie Langton M.D.   On: 04/27/2016 14:09   Dg Fluoro Guided Needle Plc Aspiration/injection Loc  Result Date: 04/20/2016 CLINICAL DATA:  Lymphoma EXAM: FLUOROSCOPICALLY GUIDED LUMBAR PUNCTURE FOR INTRATHECAL CHEMOTHERAPY TECHNIQUE: Informed consent was obtained from the  patient prior to the procedure, including potential complications of headache, allergy, and pain. A 'time out' was performed. With the patient prone, the lower back was prepped with Betadine. 1% Lidocaine was used for local anesthesia. Lumbar puncture was performed at the L2-3 level using a 20 gauge needle with return of clear CSF. Pharmacy prepared methotrexate was injected into the subarachnoid space. The patient tolerated the procedure well without apparent complication. 12 mL of clear colorless CSF was obtained for laboratory studies. FLUOROSCOPY TIME:  0 minutes 12 second IMPRESSION: Intrathecal injection of chemotherapy without complication Electronically Signed   By: Franchot Gallo M.D.   On: 04/20/2016 13:05    Discharge Laboratory Values: Lab Results  Component Value Date   WBC 11.8 (H) 04/26/2016   HGB 9.7 (L) 04/26/2016   HCT 29.0 (L) 04/26/2016   MCV 90.6 04/26/2016   PLT 319 04/26/2016   Lab Results  Component Value Date   NA 137 04/26/2016   K 4.4 04/26/2016   CL 104 04/26/2016   CO2 26 04/26/2016    Brief H and P: For complete details please refer to admission H and P, but in brief, The patient was admitted to complete cycle 4 treatment. He tolerated chemotherapy very well with no major side effects. Intrathecal chemotherapy was attempted on 04/27/2016 but the procedure was terminated due to patient discomfort. That will be rescheduled to be done as an outpatient.  Physical Exam at Discharge: BP 121/67 (BP Location: Left Arm)   Pulse 73   Temp 97.6 F (36.4 C) (Oral)   Resp 14   Ht 5\' 11"  (1.803 m)   Wt 188 lb (85.3 kg)   SpO2 97%   BMI 26.22 kg/m  GENERAL:alert, no distress and comfortable SKIN: skin color, texture, turgor are normal, no rashes or significant lesions EYES: normal, Conjunctiva are pink and non-injected, sclera clear OROPHARYNX:no exudate, no erythema and lips, buccal mucosa, and tongue normal  NECK: supple, thyroid normal size, non-tender, without  nodularity LYMPH:  no palpable lymphadenopathy in the cervical, axillary or inguinal LUNGS: clear to auscultation and percussion with normal breathing effort HEART: regular rate & rhythm and no murmurs and no lower extremity edema ABDOMEN:abdomen soft, non-tender and normal bowel sounds Musculoskeletal:no cyanosis of digits and no clubbing  NEURO: alert & oriented x 3 with fluent speech, no focal motor/sensory deficits  Hospital Course:  Active Problems:   Diffuse large B-cell lymphoma of lymph nodes of multiple regions [facility])   H/O: CVA (cerebrovascular accident)   Pancytopenia, acquired ([place])   Diffuse large B cell lymphoma ([place])   Diet:  Regular  Activity:  As tolerated  Condition at Discharge:   stable  Signed: Dr. Heath Lark [phone]  04/28/2016, 8:32 AM

## 2016-04-28 NOTE — Care Management Note (Signed)
Case Management Note  Patient Details  Name: Samuel Berger MRN: [id number] Date of Birth: Nov 20, 1945  Subjective/Objective:    Diffuse large b-cell lymphoma of lymph nodes                Action/Plan: Discharge Planning: AVS reviewed:  NCM spoke to pt and wife at home to assist with are. No NCM needs identified.   PCP Wenda Low   Expected Discharge Date:  04/28/16               Expected Discharge Plan:  Home/Self Care  In-House Referral:  NA  Discharge planning Services  CM Consult  Post Acute Care Choice:  NA Choice offered to:  NA  DME Arranged:  N/A DME Agency:  NA  HH Arranged:  NA HH Agency:  NA  Status of Service:  Completed, signed off  If discussed at [place] of Stay Meetings, dates discussed:    Additional Comments:  Erenest Rasher, RN 04/28/2016, 12:45 PM

## 2016-04-28 NOTE — Progress Notes (Signed)
Patient discharged to home with family, discharge instructions reviewed with patient who verbalized understanding. No new RX's for patient. Pateints PAC flushed with saline and Heparin.

## 2016-04-28 NOTE — Care Management Important Message (Signed)
Important Message  Patient Details  Name: Samuel Berger MRN: [id number] Date of Birth: [birth year]   Medicare Important Message Given:  Yes    Erenest Rasher, RN 04/28/2016, 12:42 PM

## 2016-04-30 ENCOUNTER — Ambulatory Visit (HOSPITAL_BASED_OUTPATIENT_CLINIC_OR_DEPARTMENT_OTHER): Payer: Medicare Other

## 2016-04-30 VITALS — BP 110/58 | HR 97 | Temp 97.9°F | Resp 18

## 2016-04-30 DIAGNOSIS — C8338 Diffuse large B-cell lymphoma, lymph nodes of multiple sites: Secondary | ICD-10-CM | POA: Diagnosis present

## 2016-04-30 DIAGNOSIS — D6481 Anemia due to antineoplastic chemotherapy: Secondary | ICD-10-CM | POA: Diagnosis not present

## 2016-04-30 DIAGNOSIS — Z95828 Presence of other vascular implants and grafts: Secondary | ICD-10-CM

## 2016-04-30 MED ORDER — PEGFILGRASTIM INJECTION 6 MG/0.6ML ~~LOC~~
6.0000 mg | PREFILLED_SYRINGE | Freq: Once | SUBCUTANEOUS | Status: AC
  Administered 2016-04-30: 6 mg via SUBCUTANEOUS
  Filled 2016-04-30: qty 0.6

## 2016-04-30 NOTE — Patient Instructions (Signed)
Pegfilgrastim injection What is this medicine? PEGFILGRASTIM (PEG fil gra stim) is a long-acting granulocyte colony-stimulating factor that stimulates the growth of neutrophils, a type of white blood cell important in the body's fight against infection. It is used to reduce the incidence of fever and infection in patients with certain types of cancer who are receiving chemotherapy that affects the bone marrow, and to increase survival after being exposed to high doses of radiation. This medicine may be used for other purposes; ask your health care provider or pharmacist if you have questions. COMMON BRAND NAME(S): Neulasta What should I tell my health care provider before I take this medicine? They need to know if you have any of these conditions: -kidney disease -latex allergy -ongoing radiation therapy -sickle cell disease -skin reactions to acrylic adhesives (On-Body Injector only) -an unusual or allergic reaction to pegfilgrastim, filgrastim, other medicines, foods, dyes, or preservatives -pregnant or trying to get pregnant -breast-feeding How should I use this medicine? This medicine is for injection under the skin. If you get this medicine at home, you will be taught how to prepare and give the pre-filled syringe or how to use the On-body Injector. Refer to the patient Instructions for Use for detailed instructions. Use exactly as directed. Take your medicine at regular intervals. Do not take your medicine more often than directed. It is important that you put your used needles and syringes in a special sharps container. Do not put them in a trash can. If you do not have a sharps container, call your pharmacist or healthcare provider to get one. Talk to your pediatrician regarding the use of this medicine in children. While this drug may be prescribed for selected conditions, precautions do apply. Overdosage: If you think you have taken too much of this medicine contact a poison control  center or emergency room at once. NOTE: This medicine is only for you. Do not share this medicine with others. What if I miss a dose? It is important not to miss your dose. Call your doctor or health care professional if you miss your dose. If you miss a dose due to an On-body Injector failure or leakage, a new dose should be administered as soon as possible using a single prefilled syringe for manual use. What may interact with this medicine? Interactions have not been studied. Give your health care provider a list of all the medicines, herbs, non-prescription drugs, or dietary supplements you use. Also tell them if you smoke, drink alcohol, or use illegal drugs. Some items may interact with your medicine. This list may not describe all possible interactions. Give your health care provider a list of all the medicines, herbs, non-prescription drugs, or dietary supplements you use. Also tell them if you smoke, drink alcohol, or use illegal drugs. Some items may interact with your medicine. What should I watch for while using this medicine? You may need blood work done while you are taking this medicine. If you are going to need a MRI, CT scan, or other procedure, tell your doctor that you are using this medicine (On-Body Injector only). What side effects may I notice from receiving this medicine? Side effects that you should report to your doctor or health care professional as soon as possible: -allergic reactions like skin rash, itching or hives, swelling of the face, lips, or tongue -dizziness -fever -pain, redness, or irritation at site where injected -pinpoint red spots on the skin -red or dark-brown urine -shortness of breath or breathing problems -stomach or   side pain, or pain at the shoulder -swelling -tiredness -trouble passing urine or change in the amount of urine Side effects that usually do not require medical attention (report to your doctor or health care professional if they  continue or are bothersome): -bone pain -muscle pain This list may not describe all possible side effects. Call your doctor for medical advice about side effects. You may report side effects to FDA at 1-800-FDA-1088. Where should I keep my medicine? Keep out of the reach of children. Store pre-filled syringes in a refrigerator between 2 and 8 degrees C (36 and 46 degrees F). Do not freeze. Keep in carton to protect from light. Throw away this medicine if it is left out of the refrigerator for more than 48 hours. Throw away any unused medicine after the expiration date. NOTE: This sheet is a summary. It may not cover all possible information. If you have questions about this medicine, talk to your doctor, pharmacist, or health care provider.  2017 Elsevier/Gold Standard (2014-04-08 14:30:14)  

## 2016-05-07 ENCOUNTER — Other Ambulatory Visit: Payer: Self-pay | Admitting: Hematology and Oncology

## 2016-05-14 ENCOUNTER — Ambulatory Visit (HOSPITAL_BASED_OUTPATIENT_CLINIC_OR_DEPARTMENT_OTHER): Payer: Medicare Other | Admitting: Hematology and Oncology

## 2016-05-14 ENCOUNTER — Other Ambulatory Visit (HOSPITAL_BASED_OUTPATIENT_CLINIC_OR_DEPARTMENT_OTHER): Payer: Medicare Other

## 2016-05-14 ENCOUNTER — Telehealth: Payer: Self-pay | Admitting: Hematology and Oncology

## 2016-05-14 ENCOUNTER — Encounter: Payer: Self-pay | Admitting: Hematology and Oncology

## 2016-05-14 DIAGNOSIS — C8338 Diffuse large B-cell lymphoma, lymph nodes of multiple sites: Secondary | ICD-10-CM | POA: Diagnosis not present

## 2016-05-14 DIAGNOSIS — D6481 Anemia due to antineoplastic chemotherapy: Secondary | ICD-10-CM | POA: Diagnosis not present

## 2016-05-14 DIAGNOSIS — T451X5A Adverse effect of antineoplastic and immunosuppressive drugs, initial encounter: Secondary | ICD-10-CM

## 2016-05-14 DIAGNOSIS — I10 Essential (primary) hypertension: Secondary | ICD-10-CM | POA: Diagnosis not present

## 2016-05-14 DIAGNOSIS — C8510 Unspecified B-cell lymphoma, unspecified site: Secondary | ICD-10-CM

## 2016-05-14 LAB — COMPREHENSIVE METABOLIC PANEL
ALBUMIN: 3.2 g/dL — AB (ref 3.5–5.0)
ALK PHOS: 61 U/L (ref 40–150)
ALT: 11 U/L (ref 0–55)
AST: 12 U/L (ref 5–34)
Anion Gap: 7 mEq/L (ref 3–11)
BUN: 12.4 mg/dL (ref 7.0–26.0)
CO2: 31 meq/L — AB (ref 22–29)
CREATININE: 0.8 mg/dL (ref 0.7–1.3)
Calcium: 9.7 mg/dL (ref 8.4–10.4)
Chloride: 104 mEq/L (ref 98–109)
EGFR: 89 mL/min/{1.73_m2} — ABNORMAL LOW (ref >90)
GLUCOSE: 125 mg/dL (ref 70–140)
Potassium: 4.3 mEq/L (ref 3.5–5.1)
SODIUM: 142 meq/L (ref 136–145)
TOTAL PROTEIN: 5.9 g/dL — AB (ref 6.4–8.3)

## 2016-05-14 LAB — CBC WITH DIFFERENTIAL/PLATELET
BASO%: 1.3 % (ref 0.0–2.0)
Basophils Absolute: 0.1 10*3/uL (ref 0.0–0.1)
EOS ABS: 0.1 10*3/uL (ref 0.0–0.5)
EOS%: 1.3 % (ref 0.0–7.0)
HCT: 30.3 % — ABNORMAL LOW (ref 38.4–49.9)
HEMOGLOBIN: 9.8 g/dL — AB (ref 13.0–17.1)
LYMPH%: 9.2 % — AB (ref 14.0–49.0)
MCH: 31.1 pg (ref 27.2–33.4)
MCHC: 32.3 g/dL (ref 32.0–36.0)
MCV: 96.2 fL (ref 79.3–98.0)
MONO#: 2.2 10*3/uL — ABNORMAL HIGH (ref 0.1–0.9)
MONO%: 23.4 % — AB (ref 0.0–14.0)
NEUT%: 64.8 % (ref 39.0–75.0)
NEUTROS ABS: 6.1 10*3/uL (ref 1.5–6.5)
Platelets: 276 10*3/uL (ref 140–400)
RBC: 3.15 10*6/uL — AB (ref 4.20–5.82)
RDW: 20.6 % — AB (ref 11.0–14.6)
WBC: 9.4 10*3/uL (ref 4.0–10.3)
lymph#: 0.9 10*3/uL (ref 0.9–3.3)

## 2016-05-14 NOTE — Assessment & Plan Note (Signed)
Clinically, the patient has excellent response to treatment with minimal side-effects He will be readmitted to the hospital on 05/15/2016 for cycle 5 of chemotherapy He will get second dose of intrathecal chemotherapy on 05/17/2016 He will return to the cancer center on 05/21/2016 for G-CSF support

## 2016-05-14 NOTE — Telephone Encounter (Signed)
Appointments scheduled per 05/14/16 los. Patient was given a copy of the AVS report and appointment schedule per 05/14/16 los. °

## 2016-05-14 NOTE — Assessment & Plan Note (Signed)
This is likely due to recent treatment. The patient denies recent history of bleeding such as epistaxis, hematuria or hematochezia. He is asymptomatic from the anemia. I will observe for now.  He does not require transfusion now. I will continue the chemotherapy at current dose without dosage adjustment.  If the anemia gets progressive worse in the future, I might have to delay his treatment or adjust the chemotherapy dose.  

## 2016-05-14 NOTE — Assessment & Plan Note (Signed)
He has significant fluctuation of his blood pressure I will hold blood pressure medications when he is hospitalized

## 2016-05-14 NOTE — Progress Notes (Signed)
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 Stable ectasia or post stenotic dilatation of the right external iliac artery measuring up to 1.5 cm. Extensive atherosclerotic disease involving the abdominal aorta with a chronic intimal flap or short segment dissection in the infrarenal abdominal aorta. Chronic occlusion of the left common iliac artery. Slowly enlarging retroperitoneal lymph nodes as described. An indolent neoplastic or lymphoproliferative process cannot be excluded      01/06/2016 Imaging    Ct abdomen and pelvis showed new extensive centrally necrotic retroperitoneal and bilateral pelvic lymphadenopathy. Differential includes lymphoproliferative condition (lymphoma/leukemia) or infectious lymphadenitis (such as due to bacterial, viral or mycobacterial etiologies). Squamous cell carcinoma metastases can have a similar appearance, although this is less likely given the location. Clinical and laboratory evaluation for a lymphoproliferative condition and tissue sampling is advised. 2. Aortic atherosclerosis. Stable 3.1 cm infrarenal abdominal aortic aneurysm      01/27/2016 Imaging    CH chest showed no acute cardiopulmonary disease. Known stable retrocrural and periaortic adenopathy. Oval density over the left neck base measuring 2.5 x 4.5 cm likely due partially to prominent internal jugular vein, although cannot exclude adenopathy in this region. Recommend neck CT with contrast. Three  vessel atherosclerotic coronary artery disease. Few small thyroid nodules with the largest measuring 1.2 cm over the left lobe. 2.4 cm left renal cyst. Sub cm right renal hypodensity unchanged and too small to characterize but likely a cyst. Minimal cholelithiasis.      01/31/2016 Imaging    ECHO showed: Left ventricle: The cavity size was normal. Systolic function was normal. The estimated ejection fraction was in the range of 60% to 65%. Wall motion was normal; there were no regional wall motion abnormalities. There was an increased relative contribution of atrial contraction to ventricular filling. Doppler parameters are consistent with abnormal left ventricular relaxation (grade 1 diastolic dysfunction).      02/07/2016 Procedure    He had imaging guided biopsy of supraclavicular lymph node      02/07/2016 Pathology Results    Accession: [id number] Core biopsies reveal diffuse areas of large atypical lymphoid cells. There are multiple cells with very large irregular nuclei or multinucleated cells. Immunohistochemistry reveals the cells are positive for CD20, CD45, bcl-2, and bcl-6. They are negative for CD10, CD30, and ALK. CD3, CD4, CD5 and CD8 reveal scattered T-cells. Ki-67 reveals an elevated proliferation rate (95%). The cells are negative for cytokeratin 7, cytokeratin 20, TTF-1, CDX-2, HMB45, SOX11, and MelanA. Overall, these findings are consistent with a diffuse large B-cell lymphoma      02/12/2016 - 02/21/2016 Hospital Admission    He was admitted to the hospital with sepsis. He received cycle 1 of chemotherapy will hospitalized      02/14/2016 Imaging    Ct abdomen and pelvis showed widespread retrocrural, retroperitoneal, and bilateral pelvic lymphadenopathy, waxing/waning when compared to recent CT, as above. Mild right hydronephrosis, likely secondary to extrinsic compression by right retroperitoneal lymphadenopathy, with  associated diminished enhancement of the right kidney.  Spleen is normal in size.      02/16/2016 Bone Marrow Biopsy    Bone marrow biopsy was negative for lymphoma involvement. Cytogenetics came back abnormal: -Y in 45% of cell lines      02/16/2016 Procedure    He had port placement      02/16/2016 - 02/21/2016 Chemotherapy    He received cycle 1 of R-EPOCH       03/08/2016 - 03/12/2016 Hospital Admission    The patient received cycle 2 of chemotherapy in the hospital      03/27/2016 PET scan    Significant decrease in size of abdominal and pelvic lymphadenopathy compared to previous CT, which shows low-grade metabolic activity. No evidence of active lymphoma within the neck or chest. 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 Continued annual follow-up by CT is recommended.       04/03/2016 - 04/07/2016 Hospital Admission    The patient received cycle 3 of chemotherapy in the hospital      04/20/2016 Procedure    He underwent successful intrathecal injection of chemotherapy without complication      09/25/6387 Pathology Results    [id number] CSF fluid negative for malignant cells      04/24/2016 - 04/28/2016 Hospital Admission    The patient received cycle 4 of chemotherapy in the hospital       INTERVAL HISTORY: Please see below for problem oriented charting. He feels well He has gained weight due to excellent appetite No recent infections. The patient denies any recent signs or symptoms of bleeding such as spontaneous epistaxis, hematuria or hematochezia. No neuropathy  REVIEW OF SYSTEMS:   Constitutional: Denies fevers, chills or abnormal weight loss Eyes: Denies blurriness of vision Ears, nose, mouth, throat, and face: Denies mucositis or sore throat Respiratory: Denies cough, dyspnea or wheezes Cardiovascular: Denies palpitation, chest discomfort or lower extremity swelling Gastrointestinal:  Denies nausea, heartburn or change in bowel habits Skin: Denies abnormal skin rashes Lymphatics: Denies new  lymphadenopathy or easy bruising Neurological:Denies numbness, tingling or new weaknesses Behavioral/Psych: Mood is stable, no new changes  All other systems were reviewed with the patient and are negative.  I have reviewed the past medical history, past surgical history, social history and family history with the patient and they are unchanged from previous note.  ALLERGIES:  is allergic to bee venom; metformin and related; and sulfa drugs cross reactors.  MEDICATIONS:  Current Outpatient Prescriptions  Medication Sig Dispense Refill  . amLODipine (NORVASC) 10 MG tablet Take 10 mg by mouth every morning.     Marland Kitchen aspirin EC 81 MG tablet Take 81 mg by mouth every morning.     Marland Kitchen atorvastatin (LIPITOR) 40 MG tablet Take 40 mg by mouth every evening.     . lactulose (CHRONULAC) 10 GM/15ML solution Take 15 mLs (10 g total) by mouth 3 (three) times daily. 240 mL 0  . lidocaine-prilocaine (EMLA) cream Apply 1 application topically as needed. (Patient taking differently: Apply 1 application topically daily as needed (port access). ) 30 g 6  . ondansetron (ZOFRAN) 8 MG tablet Take 1 tablet (8 mg total) by mouth every 8 (eight) hours as needed for nausea or vomiting. (Patient not taking: Reported on 04/03/2016) 60 tablet 6  . pioglitazone (ACTOS) 30 MG tablet Take 30 mg by mouth every morning.     . prochlorperazine (COMPAZINE) 10 MG tablet Take 1 tablet (10 mg total) by mouth every 6 (six)  hours as needed. (Patient not taking: Reported on 04/03/2016) 60 tablet 3  . sennosides-docusate sodium (SENOKOT-S) 8.6-50 MG tablet Take 2 tablets by mouth 2 (two) times daily.      No current facility-administered medications for this visit.     PHYSICAL EXAMINATION: ECOG PERFORMANCE STATUS: 1 - Symptomatic but completely ambulatory  Vitals:   05/14/16 1427  BP: (!) 93/53  Pulse: (!) 111  Resp: 18  Temp: 98.6 F (37 C)   Filed Weights   05/14/16 1427  Weight: 194 lb 3.2 oz (88.1 kg)    GENERAL:alert, no  distress and comfortable SKIN: skin color, texture, turgor are normal, no rashes or significant lesions EYES: normal, Conjunctiva are pink and non-injected, sclera clear OROPHARYNX:no exudate, no erythema and lips, buccal mucosa, and tongue normal  NECK: supple, thyroid normal size, non-tender, without nodularity LYMPH:  no palpable lymphadenopathy in the cervical, axillary or inguinal LUNGS: clear to auscultation and percussion with normal breathing effort HEART: regular rate & rhythm and no murmurs and no lower extremity edema ABDOMEN:abdomen soft, non-tender and normal bowel sounds Musculoskeletal:no cyanosis of digits and no clubbing  NEURO: alert & oriented x 3 with fluent speech, no focal motor/sensory deficits  LABORATORY DATA:  I have reviewed the data as listed    Component Value Date/Time   NA 142 05/14/2016 1402   K 4.3 05/14/2016 1402   CL 104 04/26/2016 0424   CO2 31 (H) 05/14/2016 1402   GLUCOSE 125 05/14/2016 1402   BUN 12.4 05/14/2016 1402   CREATININE 0.8 05/14/2016 1402   CALCIUM 9.7 05/14/2016 1402   PROT 5.9 (L) 05/14/2016 1402   ALBUMIN 3.2 (L) 05/14/2016 1402   AST 12 05/14/2016 1402   ALT 11 05/14/2016 1402   ALKPHOS 61 05/14/2016 1402   BILITOT <0.22 05/14/2016 1402   GFRNONAA >60 04/26/2016 0424   GFRAA >60 04/26/2016 0424    No results found for: SPEP, UPEP  Lab Results  Component Value Date   WBC 9.4 05/14/2016   NEUTROABS 6.1 05/14/2016   HGB 9.8 (L) 05/14/2016   HCT 30.3 (L) 05/14/2016   MCV 96.2 05/14/2016   PLT 276 05/14/2016      Chemistry      Component Value Date/Time   NA 142 05/14/2016 1402   K 4.3 05/14/2016 1402   CL 104 04/26/2016 0424   CO2 31 (H) 05/14/2016 1402   BUN 12.4 05/14/2016 1402   CREATININE 0.8 05/14/2016 1402      Component Value Date/Time   CALCIUM 9.7 05/14/2016 1402   ALKPHOS 61 05/14/2016 1402   AST 12 05/14/2016 1402   ALT 11 05/14/2016 1402   BILITOT <0.22 05/14/2016 1402       RADIOGRAPHIC  STUDIES: I have personally reviewed the radiological images as listed and agreed with the findings in the report. Dg Chest 2 View  Result Date: 04/16/2016 CLINICAL DATA:  Cough 1 week.  History of lymphoma. EXAM: CHEST  2 VIEW COMPARISON:  02/12/2016 and 02/07/2012.  PET-CT 03/27/2016 FINDINGS: Right IJ Port-A-Cath has tip over the SVC just above the cavoatrial junction. Lungs are adequately inflated without focal consolidation or effusion. No evidence pneumothorax. Cardiomediastinal silhouette is within normal. 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 Remainder of the exam is unchanged. IMPRESSION: No active cardiopulmonary disease. Electronically Signed   By: Marin Olp M.D.   On: 04/16/2016 08:32   Dg Fluoro Guided Needle Plc Aspiration/injection Loc  Result Date: 04/27/2016 CLINICAL DATA:  71 year old male with history of diffuse large B-cell lymphoma. EXAM: DIAGNOSTIC LUMBAR PUNCTURE UNDER FLUOROSCOPIC GUIDANCE FLUOROSCOPY TIME:  Fluoroscopy Time:  48 seconds Radiation Exposure Index (if provided by the fluoroscopic device): 5.10 mGy PROCEDURE: Informed consent was obtained from the patient prior to the procedure, including potential complications of headache, allergy, and pain. With the patient prone, the lower back was prepped with Betadine. 1% Lidocaine was used for local anesthesia. Lumbar puncture was attempted at the L4-L5 level using a 20 gauge needle. Repeated attempts at this level or made to access CSF, but were unsuccessful. Further attempts were planned at higher levels and discussed with the patient, however, the patient experienced nausea during a procedure and indicated that he was unwilling to undergo further attempts. The procedure was terminated at that time. IMPRESSION: Unsuccessful attempted lumbar puncture for intrathecal chemotherapy injection, as above. Patient will be rescheduled as an outpatient to return for additional attempts.  Electronically Signed   By: Vinnie Langton M.D.   On: 04/27/2016 14:09   Dg Fluoro Guided Needle Plc Aspiration/injection Loc  Result Date: 04/20/2016 CLINICAL DATA:  Lymphoma EXAM: FLUOROSCOPICALLY GUIDED LUMBAR PUNCTURE FOR INTRATHECAL CHEMOTHERAPY TECHNIQUE: Informed consent was obtained from the patient prior to the procedure, including potential complications of headache, allergy, and pain. A 'time out' was performed. With the patient prone, the lower back was prepped with Betadine. 1% Lidocaine was used for local anesthesia. Lumbar puncture was performed at the L2-3 level using a 20 gauge needle with return of clear CSF. Pharmacy prepared methotrexate was injected into the subarachnoid space. The patient tolerated the procedure well without apparent complication. 12 mL of clear colorless CSF was obtained for laboratory studies. FLUOROSCOPY TIME:  0 minutes 12 second IMPRESSION: Intrathecal injection of chemotherapy without complication Electronically Signed   By: Franchot Gallo M.D.   On: 04/20/2016 13:05    ASSESSMENT & PLAN:  Diffuse large B-cell lymphoma of lymph nodes of multiple regions ([place]) Clinically, the patient has excellent response to treatment with minimal side-effects He will be readmitted to the hospital on 05/15/2016 for cycle 5 of chemotherapy He will get second dose of intrathecal chemotherapy on 05/17/2016 He will return to the cancer center on 05/21/2016 for G-CSF support  Anemia due to antineoplastic chemotherapy This is likely due to recent treatment. The patient denies recent history of bleeding such as epistaxis, hematuria or hematochezia. He is asymptomatic from the anemia. I will observe for now.  He does not require transfusion now. I will continue the chemotherapy at current dose without dosage adjustment.  If the anemia gets progressive worse in the future, I might have to delay his treatment or adjust the chemotherapy dose.   Essential hypertension He has  significant fluctuation of his blood pressure I will hold blood pressure medications when he is hospitalized   No orders of the defined types were placed in this encounter.  All questions were answered. The patient knows to call the clinic with any problems, questions or concerns. No barriers to learning was detected. I spent 15 minutes counseling the patient face to face. The total time spent in the appointment was 20 minutes and more than 50% was on counseling and review of test results     Heath Lark, MD 05/14/2016 8:24 PM

## 2016-05-15 ENCOUNTER — Encounter (HOSPITAL_COMMUNITY): Payer: Self-pay

## 2016-05-15 ENCOUNTER — Inpatient Hospital Stay (HOSPITAL_COMMUNITY)
Admission: RE | Admit: 2016-05-15 | Discharge: 2016-05-19 | DRG: 847 | Disposition: A | Discharge status: Home / Self Care | Payer: Medicare Other | Source: Ambulatory Visit | Attending: Hematology and Oncology | Admitting: Hematology and Oncology

## 2016-05-15 DIAGNOSIS — K219 Gastro-esophageal reflux disease without esophagitis: Secondary | ICD-10-CM | POA: Diagnosis present

## 2016-05-15 DIAGNOSIS — Z87891 Personal history of nicotine dependence: Secondary | ICD-10-CM | POA: Diagnosis not present

## 2016-05-15 DIAGNOSIS — C8338 Diffuse large B-cell lymphoma, lymph nodes of multiple sites: Secondary | ICD-10-CM | POA: Diagnosis not present

## 2016-05-15 DIAGNOSIS — Z7982 Long term (current) use of aspirin: Secondary | ICD-10-CM | POA: Diagnosis not present

## 2016-05-15 DIAGNOSIS — I10 Essential (primary) hypertension: Secondary | ICD-10-CM | POA: Diagnosis present

## 2016-05-15 DIAGNOSIS — T451X5A Adverse effect of antineoplastic and immunosuppressive drugs, initial encounter: Secondary | ICD-10-CM | POA: Diagnosis present

## 2016-05-15 DIAGNOSIS — Z5111 Encounter for antineoplastic chemotherapy: Principal | ICD-10-CM

## 2016-05-15 DIAGNOSIS — Z79811 Long term (current) use of aromatase inhibitors: Secondary | ICD-10-CM

## 2016-05-15 DIAGNOSIS — D6481 Anemia due to antineoplastic chemotherapy: Secondary | ICD-10-CM

## 2016-05-15 DIAGNOSIS — E785 Hyperlipidemia, unspecified: Secondary | ICD-10-CM | POA: Diagnosis present

## 2016-05-15 DIAGNOSIS — C8330 Diffuse large B-cell lymphoma, unspecified site: Secondary | ICD-10-CM | POA: Diagnosis not present

## 2016-05-15 DIAGNOSIS — Z801 Family history of malignant neoplasm of trachea, bronchus and lung: Secondary | ICD-10-CM | POA: Diagnosis not present

## 2016-05-15 DIAGNOSIS — Z8673 Personal history of transient ischemic attack (TIA), and cerebral infarction without residual deficits: Secondary | ICD-10-CM

## 2016-05-15 DIAGNOSIS — E119 Type 2 diabetes mellitus without complications: Secondary | ICD-10-CM | POA: Diagnosis present

## 2016-05-15 DIAGNOSIS — K5900 Constipation, unspecified: Secondary | ICD-10-CM | POA: Diagnosis present

## 2016-05-15 DIAGNOSIS — C8510 Unspecified B-cell lymphoma, unspecified site: Secondary | ICD-10-CM

## 2016-05-15 DIAGNOSIS — Z833 Family history of diabetes mellitus: Secondary | ICD-10-CM | POA: Diagnosis not present

## 2016-05-15 MED ORDER — VINCRISTINE SULFATE CHEMO INJECTION 1 MG/ML
Freq: Once | INTRAVENOUS | Status: AC
  Administered 2016-05-15: 15:00:00 via INTRAVENOUS
  Filled 2016-05-15: qty 11

## 2016-05-15 MED ORDER — OXYCODONE HCL 5 MG PO TABS: 5.0000 mg | ORAL_TABLET | ORAL | Status: DC | PRN

## 2016-05-15 MED ORDER — HEPARIN SOD (PORK) LOCK FLUSH 100 UNIT/ML IV SOLN: 250.0000 [IU] | Freq: Once | INTRAVENOUS | Status: DC | PRN

## 2016-05-15 MED ORDER — ASPIRIN EC 81 MG PO TBEC
81.0000 mg | DELAYED_RELEASE_TABLET | Freq: Every day | ORAL | Status: DC
  Administered 2016-05-16 – 2016-05-19 (×4): 81 mg via ORAL
  Filled 2016-05-15 (×5): qty 1

## 2016-05-15 MED ORDER — EPINEPHRINE PF 1 MG/ML IJ SOLN
0.5000 mg | Freq: Once | INTRAMUSCULAR | Status: DC | PRN
  Filled 2016-05-15: qty 1

## 2016-05-15 MED ORDER — DIPHENHYDRAMINE HCL 50 MG PO CAPS
50.0000 mg | ORAL_CAPSULE | Freq: Once | ORAL | Status: AC
  Administered 2016-05-15: 50 mg via ORAL
  Filled 2016-05-15: qty 1

## 2016-05-15 MED ORDER — FAMOTIDINE IN NACL 20-0.9 MG/50ML-% IV SOLN: 20.0000 mg | Freq: Once | INTRAVENOUS | Status: DC | PRN

## 2016-05-15 MED ORDER — ONDANSETRON HCL 4 MG PO TABS: 4.0000 mg | ORAL_TABLET | Freq: Three times a day (TID) | ORAL | Status: DC | PRN

## 2016-05-15 MED ORDER — ATORVASTATIN CALCIUM 40 MG PO TABS
40.0000 mg | ORAL_TABLET | Freq: Every evening | ORAL | Status: DC
  Administered 2016-05-15 – 2016-05-18 (×4): 40 mg via ORAL
  Filled 2016-05-15 (×4): qty 1

## 2016-05-15 MED ORDER — ONDANSETRON 4 MG PO TBDP: 4.0000 mg | ORAL_TABLET | Freq: Three times a day (TID) | ORAL | Status: DC | PRN

## 2016-05-15 MED ORDER — LIDOCAINE-PRILOCAINE 2.5-2.5 % EX CREA: 1.0000 "application " | TOPICAL_CREAM | Freq: Every day | CUTANEOUS | Status: DC | PRN

## 2016-05-15 MED ORDER — ALBUTEROL SULFATE (2.5 MG/3ML) 0.083% IN NEBU: 2.5000 mg | INHALATION_SOLUTION | Freq: Once | RESPIRATORY_TRACT | Status: DC | PRN

## 2016-05-15 MED ORDER — SENNOSIDES-DOCUSATE SODIUM 8.6-50 MG PO TABS
2.0000 | ORAL_TABLET | Freq: Two times a day (BID) | ORAL | Status: DC
  Administered 2016-05-15 – 2016-05-19 (×7): 2 via ORAL
  Filled 2016-05-15 (×9): qty 2

## 2016-05-15 MED ORDER — ONDANSETRON HCL 4 MG/2ML IJ SOLN: 4.0000 mg | Freq: Three times a day (TID) | INTRAMUSCULAR | Status: DC | PRN

## 2016-05-15 MED ORDER — ENOXAPARIN SODIUM 40 MG/0.4ML ~~LOC~~ SOLN
40.0000 mg | SUBCUTANEOUS | Status: AC
  Administered 2016-05-15: 40 mg via SUBCUTANEOUS
  Filled 2016-05-15: qty 0.4

## 2016-05-15 MED ORDER — DIPHENHYDRAMINE HCL 50 MG/ML IJ SOLN: 50.0000 mg | Freq: Once | INTRAMUSCULAR | Status: DC | PRN

## 2016-05-15 MED ORDER — DIPHENHYDRAMINE HCL 50 MG/ML IJ SOLN: 25.0000 mg | Freq: Once | INTRAMUSCULAR | Status: DC | PRN

## 2016-05-15 MED ORDER — SODIUM CHLORIDE 0.9% FLUSH: 10.0000 mL | INTRAVENOUS | Status: DC | PRN

## 2016-05-15 MED ORDER — METHYLPREDNISOLONE SODIUM SUCC 125 MG IJ SOLR: 125.0000 mg | Freq: Once | INTRAMUSCULAR | Status: DC | PRN

## 2016-05-15 MED ORDER — ONDANSETRON HCL 40 MG/20ML IJ SOLN
8.0000 mg | Freq: Three times a day (TID) | INTRAMUSCULAR | Status: DC | PRN
  Filled 2016-05-15: qty 4

## 2016-05-15 MED ORDER — SENNOSIDES-DOCUSATE SODIUM 8.6-50 MG PO TABS: 1.0000 | ORAL_TABLET | Freq: Every evening | ORAL | Status: DC | PRN

## 2016-05-15 MED ORDER — SODIUM CHLORIDE 0.9 % IV SOLN
Freq: Once | INTRAVENOUS | Status: AC
  Administered 2016-05-15: 18 mg via INTRAVENOUS
  Filled 2016-05-15: qty 4

## 2016-05-15 MED ORDER — ACETAMINOPHEN 325 MG PO TABS
650.0000 mg | ORAL_TABLET | Freq: Once | ORAL | Status: AC
  Administered 2016-05-15: 650 mg via ORAL
  Filled 2016-05-15: qty 2

## 2016-05-15 MED ORDER — EPINEPHRINE PF 1 MG/10ML IJ SOSY: 0.2500 mg | PREFILLED_SYRINGE | Freq: Once | INTRAMUSCULAR | Status: DC | PRN

## 2016-05-15 MED ORDER — SODIUM CHLORIDE 0.9 % IV SOLN
INTRAVENOUS | Status: DC
  Administered 2016-05-15: 11:00:00 via INTRAVENOUS
  Administered 2016-05-18: 20 mL/h via INTRAVENOUS

## 2016-05-15 MED ORDER — SODIUM CHLORIDE 0.9% FLUSH: 3.0000 mL | INTRAVENOUS | Status: DC | PRN

## 2016-05-15 MED ORDER — ALTEPLASE 2 MG IJ SOLR: 2.0000 mg | Freq: Once | INTRAMUSCULAR | Status: DC | PRN

## 2016-05-15 MED ORDER — HEPARIN SOD (PORK) LOCK FLUSH 100 UNIT/ML IV SOLN: 500.0000 [IU] | Freq: Once | INTRAVENOUS | Status: DC | PRN

## 2016-05-15 MED ORDER — HYDROMORPHONE HCL 2 MG/ML IJ SOLN: 1.0000 mg | INTRAMUSCULAR | Status: DC | PRN

## 2016-05-15 MED ORDER — ACETAMINOPHEN 325 MG PO TABS: 650.0000 mg | ORAL_TABLET | ORAL | Status: DC | PRN

## 2016-05-15 MED ORDER — SODIUM CHLORIDE 0.9 % IV SOLN
INTRAVENOUS | Status: DC
  Administered 2016-05-15 – 2016-05-16 (×2): via INTRAVENOUS
  Administered 2016-05-18: 1000 mL via INTRAVENOUS

## 2016-05-15 MED ORDER — SODIUM CHLORIDE 0.9 % IV SOLN
375.0000 mg/m2 | Freq: Once | INTRAVENOUS | Status: AC
  Administered 2016-05-15: 800 mg via INTRAVENOUS
  Filled 2016-05-15: qty 50

## 2016-05-15 MED ORDER — SODIUM CHLORIDE 0.9 % IV SOLN: Freq: Once | INTRAVENOUS | Status: DC | PRN

## 2016-05-15 NOTE — Progress Notes (Signed)
Chemo dosages and dilutions verified by 2 chemo RNs 

## 2016-05-15 NOTE — H&P (Signed)
[facility] ADMISSION NOTE  Patient Care Team: Wenda Low, MD as PCP - General (Internal Medicine)  CHIEF COMPLAINTS/PURPOSE OF ADMISSION Diffuse large B cell lymphoma  HISTORY OF PRESENTING ILLNESS:  Samuel Berger 71 y.o. 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 Stable ectasia or post stenotic dilatation of the right external iliac artery measuring up to 1.5 cm. Extensive atherosclerotic disease involving the abdominal aorta with a chronic intimal flap or short segment dissection in the infrarenal abdominal aorta. Chronic occlusion of the left common iliac artery. Slowly enlarging retroperitoneal lymph nodes as described. An indolent neoplastic or lymphoproliferative process cannot be excluded      01/06/2016 Imaging    Ct abdomen and pelvis showed new extensive centrally necrotic retroperitoneal and bilateral pelvic lymphadenopathy. Differential includes lymphoproliferative condition (lymphoma/leukemia) or infectious lymphadenitis (such as due to bacterial, viral or mycobacterial etiologies). Squamous cell carcinoma metastases can have a similar appearance, although this is less likely given the location. Clinical and laboratory evaluation for a lymphoproliferative condition and tissue sampling is advised. 2. Aortic atherosclerosis. Stable 3.1 cm infrarenal abdominal aortic aneurysm      01/27/2016 Imaging    [facility] chest showed no acute cardiopulmonary disease. Known stable retrocrural and periaortic adenopathy. Oval density over the  left neck base measuring 2.5 x 4.5 cm likely due partially to prominent internal jugular vein, although cannot exclude adenopathy in this region. Recommend neck CT with contrast. Three vessel atherosclerotic coronary artery disease. Few small thyroid nodules with the largest measuring 1.2 cm over the left lobe. 2.4 cm left renal cyst. Sub cm right renal hypodensity unchanged and too small to characterize but likely a cyst. Minimal cholelithiasis.      01/31/2016 Imaging    ECHO showed: Left ventricle: The cavity size was normal. Systolic function was normal. The estimated ejection fraction was in the range of 60% to 65%. Wall motion was normal; there were no regional wall motion abnormalities. There was an increased relative contribution of atrial contraction to ventricular filling. Doppler parameters are consistent with abnormal left ventricular relaxation (grade 1 diastolic dysfunction).      02/07/2016 Procedure    He had imaging guided biopsy of supraclavicular lymph node      02/07/2016 Pathology Results    Accession: [id number] Core biopsies reveal diffuse areas of large atypical lymphoid cells. There are multiple cells with very large irregular nuclei or multinucleated cells. Immunohistochemistry reveals the cells are positive for CD20, CD45, bcl-2, and bcl-6. They are negative for CD10, CD30, and ALK. CD3, CD4, CD5 and CD8 reveal scattered T-cells. Ki-67 reveals an elevated proliferation rate (95%). The cells are negative for cytokeratin 7, cytokeratin 20, TTF-1, CDX-2, HMB45, SOX11, and MelanA. Overall, these findings are consistent with a diffuse large B-cell lymphoma      02/12/2016 - 02/21/2016 Hospital Admission    He was admitted to the hospital with sepsis. He received cycle 1 of chemotherapy will hospitalized      02/14/2016 Imaging    Ct abdomen and pelvis  showed widespread retrocrural, retroperitoneal, and bilateral pelvic lymphadenopathy, waxing/waning when compared to recent CT,  as above. Mild right hydronephrosis, likely secondary to extrinsic compression by right retroperitoneal lymphadenopathy, with associated diminished enhancement of the right kidney. Spleen is normal in size.      02/16/2016 Bone Marrow Biopsy    Bone marrow biopsy was negative for lymphoma involvement. Cytogenetics came back abnormal: -Y in 45% of cell lines      02/16/2016 Procedure    He had port placement      02/16/2016 - 02/21/2016 Chemotherapy    He received cycle 1 of R-EPOCH       03/08/2016 - 03/12/2016 Hospital Admission    The patient received cycle 2 of chemotherapy in the hospital      03/27/2016 PET scan    Significant decrease in size of abdominal and pelvic lymphadenopathy compared to previous CT, which shows low-grade metabolic activity. No evidence of active lymphoma within the neck or chest. 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 Continued annual follow-up by CT is recommended.       04/03/2016 - 04/07/2016 Hospital Admission    The patient received cycle 3 of chemotherapy in the hospital      04/20/2016 Procedure    He underwent successful intrathecal injection of chemotherapy without complication      04/20/2016 Pathology Results    [id number] CSF fluid negative for malignant cells      04/24/2016 - 04/28/2016 Hospital Admission    The patient received cycle 4 of chemotherapy in the hospital      He feels well No recent infections. The patient denies any recent signs or symptoms of bleeding such as spontaneous epistaxis, hematuria or hematochezia. No neuropathy No new complaints since I saw him  MEDICAL HISTORY:  Past Medical History:  Diagnosis Date  . Aneurysm artery, iliac (HCC)   . Carotid artery occlusion   . Dyslipidemia   . GERD (gastroesophageal reflux disease)   . Hepatitis    ? age 15 or 16  . Hyperlipidemia   . Hypertension   . Iliac artery aneurysm, right (HCC)   . Stroke (HCC)    01-05-2012   02-2012  . Type II or  unspecified type diabetes mellitus without mention of complication, not stated as uncontrolled     SURGICAL HISTORY: Past Surgical History:  Procedure Laterality Date  . basket procedure    . CAROTID ENDARTERECTOMY    . ENDARTERECTOMY  02/08/2012   Procedure: ENDARTERECTOMY CAROTID;  Surgeon: James D Lawson, MD;  Location: [facility] OR;  Service: Vascular;  Laterality: Right;  . IR GENERIC HISTORICAL  02/07/2016   IR US GUIDE BX ASP/DRAIN 02/07/2016 Michael Shick, MD [name]  . IR GENERIC HISTORICAL  02/16/2016   IR FLUORO GUIDE PORT INSERTION RIGHT 02/16/2016 Glenn Yamagata, MD [name]  . IR GENERIC HISTORICAL  02/16/2016   IR US GUIDE VASC ACCESS RIGHT 02/16/2016 Glenn Yamagata, MD [name]  . KIDNEY STONE SURGERY     removal  . PATCH ANGIOPLASTY  02/08/2012   Procedure: PATCH ANGIOPLASTY;  Surgeon: James D Lawson, MD;  Location: [facility] OR;  Service: Vascular;  Laterality: Right;  with dacron patch angioplasty  . TEE WITHOUT CARDIOVERSION  01/08/2012   Procedure: TRANSESOPHAGEAL ECHOCARDIOGRAM (TEE);  Surgeon: Brian S Crenshaw, MD;  Location: [facility] ENDOSCOPY;  Service: Cardiovascular;  Laterality: N/A;  . TONSILLECTOMY      SOCIAL HISTORY: Social History   Social History  . Marital status: Married      Spouse name: anne  . Number of children: 2  . Years of education: college   Occupational History  . retired    Social History Main Topics  . Smoking status: Former Smoker    Packs/day: 0.50    Years: 30.00    Types: Cigars    Quit date: 01/05/2012  . Smokeless tobacco: Former User  . Alcohol use 1.8 oz/week    1 Glasses of wine, 1 Cans of beer, 1 Shots of liquor per week     Comment: quit 2 months..Patient drinks coffee daily.OCCASIONAL DRINKER  . Drug use: No  . Sexual activity: Not Currently   Other Topics Concern  . Not on file   Social History Narrative  . No narrative on file    FAMILY HISTORY: Family History  Problem Relation Age of Onset  . Diabetes  Mother   . Lung cancer Father     ALLERGIES:  is allergic to bee venom; metformin and related; and sulfa drugs cross reactors.  MEDICATIONS:  Current Facility-Administered Medications  Medication Dose Route Frequency Provider Last Rate Last Dose  . 0.9 %  sodium chloride infusion   Intravenous Continuous  , MD      . acetaminophen (TYLENOL) tablet 650 mg  650 mg Oral Q4H PRN  , MD      . aspirin EC tablet 81 mg  81 mg Oral BH-q7a  , MD      . atorvastatin (LIPITOR) tablet 40 mg  40 mg Oral QPM  , MD      . enoxaparin (LOVENOX) injection 40 mg  40 mg Subcutaneous Q24H  , MD      . HYDROmorphone (DILAUDID) injection 1 mg  1 mg Intravenous Q2H PRN  , MD      . lidocaine-prilocaine (EMLA) cream 1 application  1 application Topical Daily PRN  , MD      . ondansetron (ZOFRAN) tablet 4-8 mg  4-8 mg Oral Q8H PRN  , MD       Or  . ondansetron (ZOFRAN-ODT) disintegrating tablet 4-8 mg  4-8 mg Oral Q8H PRN  , MD       Or  . ondansetron (ZOFRAN) injection 4 mg  4 mg Intravenous Q8H PRN  , MD       Or  . ondansetron (ZOFRAN) 8 mg in sodium chloride 0.9 % 50 mL IVPB  8 mg Intravenous Q8H PRN  , MD      . oxyCODONE (Oxy IR/ROXICODONE) immediate release tablet 5 mg  5 mg Oral Q4H PRN  , MD      . senna-docusate (Senokot-S) tablet 1 tablet  1 tablet Oral QHS PRN  , MD      . sennosides-docusate sodium (SENOKOT-S) 8.6-50 MG tablet 2 tablet  2 tablet Oral BID  , MD        REVIEW OF SYSTEMS:   Constitutional: Denies fevers, chills or abnormal night sweats Eyes: Denies blurriness of vision, double vision or watery eyes Ears, nose, mouth, throat, and face: Denies mucositis or sore throat Respiratory: Denies cough, dyspnea or wheezes Cardiovascular: Denies palpitation, chest discomfort or lower extremity swelling Gastrointestinal:  Denies nausea, heartburn or change in bowel  habits Skin: Denies abnormal skin rashes Lymphatics: Denies new lymphadenopathy or easy bruising Neurological:Denies numbness, tingling or new weaknesses Behavioral/Psych: Mood is stable, no new changes  All other systems were reviewed with the patient and are negative.  PHYSICAL EXAMINATION: ECOG PERFORMANCE STATUS: 0 - Asymptomatic GENERAL:alert, no distress and comfortable   SKIN: skin color, texture, turgor are normal, no rashes or significant lesions EYES: normal, conjunctiva are pink and non-injected, sclera clear OROPHARYNX:no exudate, no erythema and lips, buccal mucosa, and tongue normal  NECK: supple, thyroid normal size, non-tender, without nodularity LYMPH:  no palpable lymphadenopathy in the cervical, axillary or inguinal LUNGS: clear to auscultation and percussion with normal breathing effort HEART: regular rate & rhythm and no murmurs and no lower extremity edema ABDOMEN:abdomen soft, non-tender and normal bowel sounds Musculoskeletal:no cyanosis of digits and no clubbing  PSYCH: alert & oriented x 3 with fluent speech NEURO: no focal motor/sensory deficits  LABORATORY DATA:  I have reviewed the data as listed Lab Results  Component Value Date   WBC 9.4 05/14/2016   HGB 9.8 (L) 05/14/2016   HCT 30.3 (L) 05/14/2016   MCV 96.2 05/14/2016   PLT 276 05/14/2016    Recent Labs  03/12/16 0420  04/05/16 0403  04/23/16 1431 04/26/16 0424 05/14/16 1402  NA 136  < > 138  < > 140 137 142  K 4.0  < > 4.1  < > 4.7 4.4 4.3  CL 100*  --  104  --   --  104  --   CO2 30  < > 26  < > 27 26 31*  GLUCOSE 151*  < > 160*  < > 130 178* 125  BUN 19  < > 20  < > 17.4 19 12.4  CREATININE 0.62  < > 0.66  < > 1.0 0.54* 0.8  CALCIUM 8.6*  < > 8.7*  < > 9.9 8.9 9.7  GFRNONAA >60  --  >60  --   --  >60  --   GFRAA >60  --  >60  --   --  >60  --   PROT 5.5*  < > 5.7*  < > 6.6 6.0* 5.9*  ALBUMIN 2.9*  < > 3.2*  < > 3.3* 3.3* 3.2*  AST 17  < > 17  < > 13 18 12  ALT 16*  < > 10*  < > 11  12* 11  ALKPHOS 44  < > 42  < > 67 44 61  BILITOT 0.6  < > 0.2*  < > 0.23 0.4 <0.22  < > = values in this interval not displayed.  RADIOGRAPHIC STUDIES: I have personally reviewed the radiological images as listed and agreed with the findings in the report. Dg Chest 2 View  Result Date: 04/16/2016 CLINICAL DATA:  Cough 1 week.  History of lymphoma. EXAM: CHEST  2 VIEW COMPARISON:  02/12/2016 and 02/07/2012.  PET-CT 03/27/2016 FINDINGS: Right IJ Port-A-Cath has tip over the SVC just above the cavoatrial junction. Lungs are adequately inflated without focal consolidation or effusion. No evidence pneumothorax. Cardiomediastinal silhouette is within normal. 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 Remainder of the exam is unchanged. IMPRESSION: No active cardiopulmonary disease. Electronically Signed   By: Daniel  Boyle M.D.   On: 04/16/2016 08:32   Dg Fluoro Guided Needle Plc Aspiration/injection Loc  Result Date: 04/27/2016 CLINICAL DATA:  70-year-old male with history of diffuse large B-cell lymphoma. EXAM: DIAGNOSTIC LUMBAR PUNCTURE UNDER FLUOROSCOPIC GUIDANCE FLUOROSCOPY TIME:  Fluoroscopy Time:  48 seconds Radiation Exposure Index (if provided by the fluoroscopic device): 5.10 mGy PROCEDURE: Informed consent was obtained from the patient prior to the procedure, including potential complications of headache, allergy, and pain. With the patient prone, the lower back was prepped with Betadine. 1% Lidocaine was   used for local anesthesia. Lumbar puncture was attempted at the L4-L5 level using a 20 gauge needle. Repeated attempts at this level or made to access CSF, but were unsuccessful. Further attempts were planned at higher levels and discussed with the patient, however, the patient experienced nausea during a procedure and indicated that he was unwilling to undergo further attempts. The procedure was terminated at that time. IMPRESSION: Unsuccessful attempted lumbar  puncture for intrathecal chemotherapy injection, as above. Patient will be rescheduled as an outpatient to return for additional attempts. Electronically Signed   By: Vinnie Langton M.D.   On: 04/27/2016 14:09   Dg Fluoro Guided Needle Plc Aspiration/injection Loc  Result Date: 04/20/2016 CLINICAL DATA:  Lymphoma EXAM: FLUOROSCOPICALLY GUIDED LUMBAR PUNCTURE FOR INTRATHECAL CHEMOTHERAPY TECHNIQUE: Informed consent was obtained from the patient prior to the procedure, including potential complications of headache, allergy, and pain. A 'time out' was performed. With the patient prone, the lower back was prepped with Betadine. 1% Lidocaine was used for local anesthesia. Lumbar puncture was performed at the L2-3 level using a 20 gauge needle with return of clear CSF. Pharmacy prepared methotrexate was injected into the subarachnoid space. The patient tolerated the procedure well without apparent complication. 12 mL of clear colorless CSF was obtained for laboratory studies. FLUOROSCOPY TIME:  0 minutes 12 second IMPRESSION: Intrathecal injection of chemotherapy without complication Electronically Signed   By: Franchot Gallo M.D.   On: 04/20/2016 13:05    ASSESSMENT & PLAN:   Diffuse large B-cell lymphoma of lymph nodes of multiple regions ([place]) Clinically, the patient has excellent response to treatment with minimal side-effects He is readmitted to the hospital on 05/15/2016 for cycle 5 of chemotherapy He will get second dose of intrathecal chemotherapy on 05/17/2016 He will return to the cancer center on 05/21/2016 for G-CSF support  H/O: CVA (cerebrovascular accident) We discontinue Plavix from recent hospitalization Currently, he is only on 81 mg aspirin therapy We exchanged his antiplatelet agent due to anticipated IT chemotherapy I will hold Lovenox on Wednesday, 05/16/16 in anticipation for IT methotrexate  Anemia due to antineoplastic chemotherapy This is likely due to recent treatment. The  patient denies recent history of bleeding such as epistaxis, hematuria or hematochezia. Heis asymptomatic from the anemia. I will observe for now. Hedoes not require transfusion now. I will continue the chemotherapy at current dose without dosage adjustment. If the anemia gets progressive worse in the future, I might have to delay histreatment or adjust the chemotherapy dose.  DVT prophylaxis Lovenox  History of severe constipation Continue regular laxatives therapy  CODE STATUS Full code  Discharge planning At the end of the week when chemotherapy is completed  All questions were answered. The patient knows to call the clinic with any problems, questions or concerns.    Heath Lark, MD 05/15/2016 8:12 AM

## 2016-05-16 LAB — COMPREHENSIVE METABOLIC PANEL
ALK PHOS: 45 U/L (ref 38–126)
ALT: 12 U/L — AB (ref 17–63)
ANION GAP: 5 (ref 5–15)
AST: 13 U/L — ABNORMAL LOW (ref 15–41)
Albumin: 3 g/dL — ABNORMAL LOW (ref 3.5–5.0)
BUN: 13 mg/dL (ref 6–20)
CALCIUM: 8.9 mg/dL (ref 8.9–10.3)
CO2: 28 mmol/L (ref 22–32)
CREATININE: 0.62 mg/dL (ref 0.61–1.24)
Chloride: 107 mmol/L (ref 101–111)
Glucose, Bld: 157 mg/dL — ABNORMAL HIGH (ref 65–99)
Potassium: 4.4 mmol/L (ref 3.5–5.1)
Sodium: 140 mmol/L (ref 135–145)
Total Bilirubin: 0.1 mg/dL — ABNORMAL LOW (ref 0.3–1.2)
Total Protein: 5.5 g/dL — ABNORMAL LOW (ref 6.5–8.1)

## 2016-05-16 LAB — CBC WITH DIFFERENTIAL/PLATELET
Basophils Absolute: 0 10*3/uL (ref 0.0–0.1)
Basophils Relative: 0 %
EOS ABS: 0 10*3/uL (ref 0.0–0.7)
EOS PCT: 0 %
HCT: 26.6 % — ABNORMAL LOW (ref 39.0–52.0)
HEMOGLOBIN: 8.8 g/dL — AB (ref 13.0–17.0)
LYMPHS PCT: 6 %
Lymphs Abs: 0.8 10*3/uL (ref 0.7–4.0)
MCH: 31.4 pg (ref 26.0–34.0)
MCHC: 33.1 g/dL (ref 30.0–36.0)
MCV: 95 fL (ref 78.0–100.0)
MONOS PCT: 5 %
Monocytes Absolute: 0.6 10*3/uL (ref 0.1–1.0)
Neutro Abs: 10.5 10*3/uL — ABNORMAL HIGH (ref 1.7–7.7)
Neutrophils Relative %: 89 %
PLATELETS: 269 10*3/uL (ref 150–400)
RBC: 2.8 MIL/uL — AB (ref 4.22–5.81)
RDW: 20.2 % — ABNORMAL HIGH (ref 11.5–15.5)
WBC: 11.9 10*3/uL — AB (ref 4.0–10.5)

## 2016-05-16 MED ORDER — SODIUM CHLORIDE 0.9 % IV SOLN
Freq: Once | INTRAVENOUS | Status: AC
  Administered 2016-05-16: 8 mg via INTRAVENOUS
  Filled 2016-05-16: qty 4

## 2016-05-16 MED ORDER — VINCRISTINE SULFATE CHEMO INJECTION 1 MG/ML
Freq: Once | INTRAVENOUS | Status: AC
  Administered 2016-05-16: 14:00:00 via INTRAVENOUS
  Filled 2016-05-16: qty 11

## 2016-05-16 NOTE — Progress Notes (Signed)
Samuel Berger   DOB:[birth year]   [id number]    Subjective: He tolerated cycle 5 day 2 well. No side-effects whatsoever  Objective:  Vitals:   05/16/16 0543 05/16/16 1416  BP: (!) 117/56 (!) 102/52  Pulse: 85 89  Resp: 18 16  Temp: 97.9 F (36.6 C) 97.9 F (36.6 C)     Intake/Output Summary (Last 24 hours) at 05/16/16 1921 Last data filed at 05/16/16 1639  Gross per 24 hour  Intake           2023.2 ml  Output             1050 ml  Net            973.2 ml    GENERAL:alert, no distress and comfortable SKIN: skin color, texture, turgor are normal, no rashes or significant lesions EYES: normal, Conjunctiva are pink and non-injected, sclera clear Musculoskeletal:no cyanosis of digits and no clubbing  NEURO: alert & oriented x 3 with fluent speech, no focal motor/sensory deficits   Labs:  Lab Results  Component Value Date   WBC 11.9 (H) 05/16/2016   HGB 8.8 (L) 05/16/2016   HCT 26.6 (L) 05/16/2016   MCV 95.0 05/16/2016   PLT 269 05/16/2016   NEUTROABS 10.5 (H) 05/16/2016    Lab Results  Component Value Date   NA 140 05/16/2016   K 4.4 05/16/2016   CL 107 05/16/2016   CO2 28 05/16/2016    Assessment & Plan:   Diffuse large B-cell lymphoma of lymph nodes of multiple regions ([place]) Clinically, the patient has excellent response to treatment with minimal side-effects He isreadmitted to the hospital on 05/15/2016 for cycle 5 of chemotherapy He will get second dose of intrathecal chemotherapy on 05/17/2016 He will return to the cancer center on 05/21/2016 for G-CSF support  H/O: CVA (cerebrovascular accident) We discontinue Plavix from recent hospitalization Currently, he is only on 81 mg aspirin therapy We exchanged his antiplatelet agent due to anticipated IT chemotherapy I will hold Lovenox on Wednesday, 05/16/16 in anticipation for IT methotrexate  Anemia due to antineoplastic chemotherapy This is likely due to recent treatment. The patient denies recent  history of bleeding such as epistaxis, hematuria or hematochezia. Heis asymptomatic from the anemia. I will observe for now. Hedoes not require transfusion now. I will continue the chemotherapy at current dose without dosage adjustment. If the anemia gets progressive worse in the future, I might have to delay histreatment or adjust the chemotherapy dose.  DVT prophylaxis Lovenox  History of severe constipation Continue regular laxatives therapy  CODE STATUS Full code  Discharge planning At the end of the week when chemotherapy is completed  Heath Lark, MD 05/16/2016  7:21 PM

## 2016-05-16 NOTE — Progress Notes (Signed)
Manual calculation of BSA and chemotherapy dosing completed.  Verified by Jena Gauss, RN and Aldean Baker RN

## 2016-05-17 ENCOUNTER — Ambulatory Visit (HOSPITAL_COMMUNITY): Admit: 2016-05-17 | Payer: Medicare Other

## 2016-05-17 ENCOUNTER — Inpatient Hospital Stay (HOSPITAL_COMMUNITY)
Admit: 2016-05-17 | Discharge: 2016-05-17 | Disposition: A | Discharge status: Home / Self Care | Payer: Medicare Other | Attending: Hematology and Oncology | Admitting: Hematology and Oncology

## 2016-05-17 DIAGNOSIS — C8338 Diffuse large B-cell lymphoma, lymph nodes of multiple sites: Secondary | ICD-10-CM

## 2016-05-17 DIAGNOSIS — T451X5A Adverse effect of antineoplastic and immunosuppressive drugs, initial encounter: Secondary | ICD-10-CM | POA: Diagnosis present

## 2016-05-17 DIAGNOSIS — D6481 Anemia due to antineoplastic chemotherapy: Secondary | ICD-10-CM | POA: Diagnosis present

## 2016-05-17 DIAGNOSIS — Z801 Family history of malignant neoplasm of trachea, bronchus and lung: Secondary | ICD-10-CM

## 2016-05-17 DIAGNOSIS — Z833 Family history of diabetes mellitus: Secondary | ICD-10-CM

## 2016-05-17 DIAGNOSIS — Z8673 Personal history of transient ischemic attack (TIA), and cerebral infarction without residual deficits: Secondary | ICD-10-CM

## 2016-05-17 DIAGNOSIS — Z7982 Long term (current) use of aspirin: Secondary | ICD-10-CM

## 2016-05-17 DIAGNOSIS — Z87891 Personal history of nicotine dependence: Secondary | ICD-10-CM

## 2016-05-17 DIAGNOSIS — K219 Gastro-esophageal reflux disease without esophagitis: Secondary | ICD-10-CM | POA: Diagnosis present

## 2016-05-17 DIAGNOSIS — K5900 Constipation, unspecified: Secondary | ICD-10-CM | POA: Diagnosis present

## 2016-05-17 DIAGNOSIS — I10 Essential (primary) hypertension: Secondary | ICD-10-CM | POA: Diagnosis present

## 2016-05-17 DIAGNOSIS — E119 Type 2 diabetes mellitus without complications: Secondary | ICD-10-CM | POA: Diagnosis present

## 2016-05-17 DIAGNOSIS — Z5111 Encounter for antineoplastic chemotherapy: Principal | ICD-10-CM

## 2016-05-17 DIAGNOSIS — E785 Hyperlipidemia, unspecified: Secondary | ICD-10-CM | POA: Diagnosis present

## 2016-05-17 MED ORDER — LIDOCAINE HCL 1 % IJ SOLN
INTRAMUSCULAR | Status: AC
  Filled 2016-05-17: qty 20

## 2016-05-17 MED ORDER — SODIUM CHLORIDE 0.9 % IJ SOLN
Freq: Once | INTRAMUSCULAR | Status: AC
  Administered 2016-05-17: 11:00:00 via INTRATHECAL
  Filled 2016-05-17: qty 0.48

## 2016-05-17 MED ORDER — VINCRISTINE SULFATE CHEMO INJECTION 1 MG/ML
Freq: Once | INTRAVENOUS | Status: AC
  Administered 2016-05-17: 13:00:00 via INTRAVENOUS
  Filled 2016-05-17: qty 11

## 2016-05-17 MED ORDER — SODIUM CHLORIDE 0.9 % IV SOLN
Freq: Once | INTRAVENOUS | Status: AC
  Administered 2016-05-17: 8 mg via INTRAVENOUS
  Filled 2016-05-17: qty 4

## 2016-05-17 NOTE — Progress Notes (Signed)
Chemotherapy dosage and calculations checked and verified with Kirkland Hun, RN.

## 2016-05-17 NOTE — Progress Notes (Signed)
S/p intrathecal methotrexate in radiology.  Alert and oriented x 4 .Patient flat on bed. No c/o pain. Vital signs obtained. Call bell, urinal  within reach. Will continue to monitor.

## 2016-05-17 NOTE — Progress Notes (Signed)
Checked chemo dosages and concentrations with Delora Fuel, chemo certified RN

## 2016-05-17 NOTE — Progress Notes (Signed)
Bandaid to lower back intact, no bleeding.

## 2016-05-17 NOTE — Progress Notes (Signed)
Samuel Berger   DOB:Jul 22, 1945   [id number]    Subjective: He tolerated cycle 5, day 3 well. He denies side effects whatsoever.  Denies nausea, mucositis or constipation  Objective:  Vitals:   05/16/16 2239 05/17/16 0517  BP: 127/67 122/69  Pulse: 88 70  Resp: 16 16  Temp: 98.7 F (37.1 C) 97.7 F (36.5 C)     Intake/Output Summary (Last 24 hours) at 05/17/16 0749 Last data filed at 05/17/16 0600  Gross per 24 hour  Intake           2116.8 ml  Output              850 ml  Net           1266.8 ml    GENERAL:alert, no distress and comfortable SKIN: skin color, texture, turgor are normal, no rashes or significant lesions EYES: normal, Conjunctiva are pink and non-injected, sclera clear OROPHARYNX:no exudate, no erythema and lips, buccal mucosa, and tongue normal  NECK: supple, thyroid normal size, non-tender, without nodularity LYMPH:  no palpable lymphadenopathy in the cervical, axillary or inguinal LUNGS: clear to auscultation and percussion with normal breathing effort HEART: regular rate & rhythm and no murmurs and no lower extremity edema ABDOMEN:abdomen soft, non-tender and normal bowel sounds Musculoskeletal:no cyanosis of digits and no clubbing  NEURO: alert & oriented x 3 with fluent speech, no focal motor/sensory deficits   Labs:  Lab Results  Component Value Date   WBC 11.9 (H) 05/16/2016   HGB 8.8 (L) 05/16/2016   HCT 26.6 (L) 05/16/2016   MCV 95.0 05/16/2016   PLT 269 05/16/2016   NEUTROABS 10.5 (H) 05/16/2016    Lab Results  Component Value Date   NA 140 05/16/2016   K 4.4 05/16/2016   CL 107 05/16/2016   CO2 28 05/16/2016     Assessment & Plan:   Diffuse large B-cell lymphoma of lymph nodes of multiple regions (HCC) Clinically, the patient has excellent response to treatment with minimal side-effects He isreadmitted to the hospital on 05/15/2016 for cycle 5of chemotherapy He will get second dose of intrathecal chemotherapy on 05/17/2016 He  will return to the cancer center on 05/21/2016 for G-CSF support  H/O: CVA (cerebrovascular accident) We discontinue Plavix from recent hospitalization Currently, he is only on 81 mg aspirin therapy We exchanged his antiplatelet agent due to anticipated IT chemotherapy I will hold Lovenox on Wednesday, 2/14/18in anticipation for IT methotrexate  Anemia due to antineoplastic chemotherapy This is likely due to recent treatment. The patient denies recent history of bleeding such as epistaxis, hematuria or hematochezia. Heis asymptomatic from the anemia. I will observe for now. Hedoes not require transfusion now. I will continue the chemotherapy at current dose without dosage adjustment. If the anemia gets progressive worse in the future, I might have to delay histreatment or adjust the chemotherapy dose.  DVT prophylaxis Lovenox to be resumed tomorrow  History of severe constipation Continue regular laxatives therapy  CODE STATUS Full code  Discharge planning At the end of the week when chemotherapy is completed  Heath Lark, MD 05/17/2016  7:49 AM

## 2016-05-17 NOTE — Progress Notes (Signed)
Agree with am nurse assessment. Patient denies pain. In good spirit.

## 2016-05-18 MED ORDER — VINCRISTINE SULFATE CHEMO INJECTION 1 MG/ML
Freq: Once | INTRAVENOUS | Status: AC
  Administered 2016-05-18: 12:00:00 via INTRAVENOUS
  Filled 2016-05-18 (×2): qty 11

## 2016-05-18 MED ORDER — ONDANSETRON HCL 40 MG/20ML IJ SOLN
Freq: Once | INTRAMUSCULAR | Status: AC
  Administered 2016-05-18: 8 mg via INTRAVENOUS
  Filled 2016-05-18: qty 4

## 2016-05-18 MED ORDER — ENOXAPARIN SODIUM 40 MG/0.4ML ~~LOC~~ SOLN
40.0000 mg | SUBCUTANEOUS | Status: DC
  Administered 2016-05-18: 40 mg via SUBCUTANEOUS
  Filled 2016-05-18: qty 0.4

## 2016-05-18 MED ORDER — SODIUM CHLORIDE 0.9% FLUSH: 10.0000 mL | INTRAVENOUS | Status: DC | PRN

## 2016-05-18 NOTE — Progress Notes (Signed)
Verified dosage and concentrations of chemo with second chemo-certified RN, Andi Hence

## 2016-05-18 NOTE — Progress Notes (Signed)
Samuel Berger   DOB:[birth year]   [id number]    Subjective: He tolerated chemotherapy well.  He has no headache or side effects from IT chemo  Objective:  Vitals:   05/17/16 2122 05/18/16 0521  BP: 131/63 131/69  Pulse: 83 75  Resp: 18 18  Temp: 98.4 F (36.9 C) 97.9 F (36.6 C)     Intake/Output Summary (Last 24 hours) at 05/18/16 [id number] Last data filed at 05/18/16 [id number]  Gross per 24 hour  Intake           667.67 ml  Output             1500 ml  Net          -832.33 ml    GENERAL:alert, no distress and comfortable SKIN: skin color, texture, turgor are normal, no rashes or significant lesions EYES: normal, Conjunctiva are pink and non-injected, sclera clear OROPHARYNX:no exudate, no erythema and lips, buccal mucosa, and tongue normal  NECK: supple, thyroid normal size, non-tender, without nodularity LYMPH:  no palpable lymphadenopathy in the cervical, axillary or inguinal LUNGS: clear to auscultation and percussion with normal breathing effort HEART: regular rate & rhythm and no murmurs and no lower extremity edema ABDOMEN:abdomen soft, non-tender and normal bowel sounds Musculoskeletal:no cyanosis of digits and no clubbing  NEURO: alert & oriented x 3 with fluent speech, no focal motor/sensory deficits   Labs:  Lab Results  Component Value Date   WBC 11.9 (H) 05/16/2016   HGB 8.8 (L) 05/16/2016   HCT 26.6 (L) 05/16/2016   MCV 95.0 05/16/2016   PLT 269 05/16/2016   NEUTROABS 10.5 (H) 05/16/2016    Lab Results  Component Value Date   NA 140 05/16/2016   K 4.4 05/16/2016   CL 107 05/16/2016   CO2 28 05/16/2016    Studies:  Dg Fluoro Guided Loc Of Needle/cath Tip For Spinal Inject Lt  Result Date: 05/17/2016 CLINICAL DATA:  Diffuse large B-cell lymphoma EXAM: DIAGNOSTIC LUMBAR PUNCTURE UNDER FLUOROSCOPIC GUIDANCE FLUOROSCOPY TIME:  Fluoroscopy Time:  0 minutes 39 second Radiation Exposure Index (if provided by the fluoroscopic device): Number of Acquired Spot  Images: 0 PROCEDURE: Informed consent was obtained from the patient prior to the procedure, including potential complications of headache, allergy, and pain. With the patient prone, the lower back was prepped with Betadine. 1% Lidocaine was used for local anesthesia. Lumbar puncture was performed at the L2-3 level using a 20 gauge needle with return of clear CSF with an opening pressure of 20 cm water. Intrathecal methotrexate prepared by the pharmacist was injected into the subarachnoid space. IMPRESSION: Successful intrathecal injection of methotrexate. Electronically Signed   By: Franchot Gallo M.D.   On: 05/17/2016 12:35    Assessment & Plan:   Diffuse large B-cell lymphoma of lymph nodes of multiple regions ([place]) Clinically, the patient has excellent response to treatment with minimal side-effects He isreadmitted to the hospital on 05/15/2016 for cycle 5of chemotherapy He has received second dose of intrathecal chemotherapy on 05/17/2016 He will return to the cancer center on 05/21/2016 for G-CSF support  H/O: CVA (cerebrovascular accident) We discontinue Plavix from recent hospitalization Currently, he is only on 81 mg aspirin therapy We exchanged his antiplatelet agent due to anticipated IT chemotherapy Resume Lovenox today  Anemia due to antineoplastic chemotherapy This is likely due to recent treatment. The patient denies recent history of bleeding such as epistaxis, hematuria or hematochezia. Heis asymptomatic from the anemia. I will observe for now. Hedoes not  require transfusion now. I will continue the chemotherapy at current dose without dosage adjustment. If the anemia gets progressive worse in the future, I might have to delay histreatment or adjust the chemotherapy dose.  DVT prophylaxis Lovenox  History of severe constipation Continue regular laxatives therapy  CODE STATUS Full code  Discharge planning When chemotherapy is completed tomorrow  Heath Lark,  MD 05/18/2016  8:19 AM

## 2016-05-19 MED ORDER — ONDANSETRON HCL 40 MG/20ML IJ SOLN
Freq: Once | INTRAMUSCULAR | Status: AC
  Administered 2016-05-19: 16 mg via INTRAVENOUS
  Filled 2016-05-19: qty 8

## 2016-05-19 MED ORDER — HEPARIN SOD (PORK) LOCK FLUSH 100 UNIT/ML IV SOLN
500.0000 [IU] | Freq: Once | INTRAVENOUS | Status: DC
  Filled 2016-05-19: qty 5

## 2016-05-19 MED ORDER — SODIUM CHLORIDE 0.9 % IV SOLN
750.0000 mg/m2 | Freq: Once | INTRAVENOUS | Status: AC
  Administered 2016-05-19: 1600 mg via INTRAVENOUS
  Filled 2016-05-19: qty 80

## 2016-05-19 NOTE — Discharge Summary (Signed)
Physician Discharge Summary  Patient ID: Samuel Berger MRN: [id number] DOB/AGE: [birth year] 71 y.o.  Admit date: 05/15/2016 Discharge date: 05/19/2016  Primary Care Physician:  Wenda Low, MD   Discharge Diagnoses:    Present on Admission: . Anemia due to antineoplastic chemotherapy . Diffuse large B cell lymphoma ([place])   Discharge Medications:  Allergies as of 05/19/2016      Reactions   Bee Venom Hives   Metformin And Related    dizziness   Sulfa Drugs Cross Reactors Other (See Comments)   Unknown      Medication List    TAKE these medications   amLODipine 10 MG tablet Commonly known as:  NORVASC Take 10 mg by mouth every morning.   aspirin EC 81 MG tablet Take 81 mg by mouth every morning.   atorvastatin 40 MG tablet Commonly known as:  LIPITOR Take 40 mg by mouth every evening.   lidocaine-prilocaine cream Commonly known as:  EMLA Apply 1 application topically as needed. What changed:  when to take this  reasons to take this   pioglitazone 30 MG tablet Commonly known as:  ACTOS Take 30 mg by mouth every morning.   sennosides-docusate sodium 8.6-50 MG tablet Commonly known as:  SENOKOT-S Take 2 tablets by mouth 2 (two) times daily.       Disposition and Follow-up:   Significant Diagnostic Studies:  Dg Fluoro Guided Needle Plc Aspiration/injection Loc  Result Date: 04/27/2016 CLINICAL DATA:  71 year old male with history of diffuse large B-cell lymphoma. EXAM: DIAGNOSTIC LUMBAR PUNCTURE UNDER FLUOROSCOPIC GUIDANCE FLUOROSCOPY TIME:  Fluoroscopy Time:  48 seconds Radiation Exposure Index (if provided by the fluoroscopic device): 5.10 mGy PROCEDURE: Informed consent was obtained from the patient prior to the procedure, including potential complications of headache, allergy, and pain. With the patient prone, the lower back was prepped with Betadine. 1% Lidocaine was used for local anesthesia. Lumbar puncture was attempted at the L4-L5  level using a 20 gauge needle. Repeated attempts at this level or made to access CSF, but were unsuccessful. Further attempts were planned at higher levels and discussed with the patient, however, the patient experienced nausea during a procedure and indicated that he was unwilling to undergo further attempts. The procedure was terminated at that time. IMPRESSION: Unsuccessful attempted lumbar puncture for intrathecal chemotherapy injection, as above. Patient will be rescheduled as an outpatient to return for additional attempts. Electronically Signed   By: Vinnie Langton M.D.   On: 04/27/2016 14:09   Dg Fluoro Guided Needle Plc Aspiration/injection Loc  Result Date: 04/20/2016 CLINICAL DATA:  Lymphoma EXAM: FLUOROSCOPICALLY GUIDED LUMBAR PUNCTURE FOR INTRATHECAL CHEMOTHERAPY TECHNIQUE: Informed consent was obtained from the patient prior to the procedure, including potential complications of headache, allergy, and pain. A 'time out' was performed. With the patient prone, the lower back was prepped with Betadine. 1% Lidocaine was used for local anesthesia. Lumbar puncture was performed at the L2-3 level using a 20 gauge needle with return of clear CSF. Pharmacy prepared methotrexate was injected into the subarachnoid space. The patient tolerated the procedure well without apparent complication. 12 mL of clear colorless CSF was obtained for laboratory studies. FLUOROSCOPY TIME:  0 minutes 12 second IMPRESSION: Intrathecal injection of chemotherapy without complication Electronically Signed   By: Franchot Gallo M.D.   On: 04/20/2016 13:05   Dg Fluoro Guided Loc Of Needle/cath Tip For Spinal Inject Lt  Result Date: 05/17/2016 CLINICAL DATA:  Diffuse large B-cell lymphoma EXAM: DIAGNOSTIC LUMBAR PUNCTURE UNDER FLUOROSCOPIC GUIDANCE  FLUOROSCOPY TIME:  Fluoroscopy Time:  0 minutes 39 second Radiation Exposure Index (if provided by the fluoroscopic device): Number of Acquired Spot Images: 0 PROCEDURE: Informed  consent was obtained from the patient prior to the procedure, including potential complications of headache, allergy, and pain. With the patient prone, the lower back was prepped with Betadine. 1% Lidocaine was used for local anesthesia. Lumbar puncture was performed at the L2-3 level using a 20 gauge needle with return of clear CSF with an opening pressure of 20 cm water. Intrathecal methotrexate prepared by the pharmacist was injected into the subarachnoid space. IMPRESSION: Successful intrathecal injection of methotrexate. Electronically Signed   By: Franchot Gallo M.D.   On: 05/17/2016 12:35    Discharge Laboratory Values: Lab Results  Component Value Date   WBC 11.9 (H) 05/16/2016   HGB 8.8 (L) 05/16/2016   HCT 26.6 (L) 05/16/2016   MCV 95.0 05/16/2016   PLT 269 05/16/2016   Lab Results  Component Value Date   NA 140 05/16/2016   K 4.4 05/16/2016   CL 107 05/16/2016   CO2 28 05/16/2016    Brief H and P: For complete details please refer to admission H and P, but in brief, he was admitted to the hospital on 05/15/16 for cycle 5 of chemotherapy. He tolerated chemotherapy well without side-effects  Physical Exam at Discharge: BP 138/67 (BP Location: Right Arm)   Pulse 70   Temp 97.9 F (36.6 C) (Oral)   Resp 16   Ht 5\' 11"  (1.803 m)   Wt 194 lb (88 kg)   SpO2 97%   BMI 27.06 kg/m  GENERAL:alert, no distress and comfortable SKIN: skin color, texture, turgor are normal, no rashes or significant lesions EYES: normal, Conjunctiva are pink and non-injected, sclera clear OROPHARYNX:no exudate, no erythema and lips, buccal mucosa, and tongue normal  NECK: supple, thyroid normal size, non-tender, without nodularity LYMPH:  no palpable lymphadenopathy in the cervical, axillary or inguinal LUNGS: clear to auscultation and percussion with normal breathing effort HEART: regular rate & rhythm and no murmurs and no lower extremity edema ABDOMEN:abdomen soft, non-tender and normal bowel  sounds Musculoskeletal:no cyanosis of digits and no clubbing  NEURO: alert & oriented x 3 with fluent speech, no focal motor/sensory deficits  Hospital Course:  Active Problems:   Anemia due to antineoplastic chemotherapy   Diffuse large B cell lymphoma ([facility])  Diffuse large B-cell lymphoma of lymph nodes of multiple regions ([facility]) Clinically, the patient has excellent response to treatment with minimal side-effects He isreadmitted to the hospital on 05/15/2016 for cycle 5of chemotherapy He has received second dose of intrathecal chemotherapy on 05/17/2016 He will return to the cancer center on 05/21/2016 for G-CSF support  H/O: CVA (cerebrovascular accident) We discontinue Plavix from recent hospitalization Currently, he is only on 81 mg aspirin therapy We exchanged his antiplatelet agent due to anticipated IT chemotherapy  Anemia due to antineoplastic chemotherapy This is likely due to recent treatment. The patient denies recent history of bleeding such as epistaxis, hematuria or hematochezia. Heis asymptomatic from the anemia. I will observe for now.   DVT prophylaxis Lovenox  History of severe constipation Continue regular laxatives therapy  CODE STATUS Full code  Diet:  Regular  Activity:  As tolerated  Condition at Discharge:   stable  Signed: Dr. Heath Lark [phone]  05/19/2016, 6:38 AM

## 2016-05-19 NOTE — Progress Notes (Signed)
Patient discharged to home with family, discharge instructions reviewed with patient who vernbalized understanding. PAC flushed with saline and heparin. No new RX's.

## 2016-05-21 ENCOUNTER — Ambulatory Visit (HOSPITAL_BASED_OUTPATIENT_CLINIC_OR_DEPARTMENT_OTHER): Payer: Medicare Other

## 2016-05-21 VITALS — BP 110/50 | HR 90 | Temp 97.5°F | Resp 18

## 2016-05-21 DIAGNOSIS — D6481 Anemia due to antineoplastic chemotherapy: Secondary | ICD-10-CM

## 2016-05-21 DIAGNOSIS — C8338 Diffuse large B-cell lymphoma, lymph nodes of multiple sites: Secondary | ICD-10-CM

## 2016-05-21 DIAGNOSIS — Z95828 Presence of other vascular implants and grafts: Secondary | ICD-10-CM

## 2016-05-21 MED ORDER — PEGFILGRASTIM INJECTION 6 MG/0.6ML ~~LOC~~
6.0000 mg | PREFILLED_SYRINGE | Freq: Once | SUBCUTANEOUS | Status: AC
  Administered 2016-05-21: 6 mg via SUBCUTANEOUS
  Filled 2016-05-21: qty 0.6

## 2016-05-21 NOTE — Patient Instructions (Signed)
Pegfilgrastim injection What is this medicine? PEGFILGRASTIM (PEG fil gra stim) is a long-acting granulocyte colony-stimulating factor that stimulates the growth of neutrophils, a type of white blood cell important in the body's fight against infection. It is used to reduce the incidence of fever and infection in patients with certain types of cancer who are receiving chemotherapy that affects the bone marrow, and to increase survival after being exposed to high doses of radiation. This medicine may be used for other purposes; ask your health care provider or pharmacist if you have questions. COMMON BRAND NAME(S): Neulasta What should I tell my health care provider before I take this medicine? They need to know if you have any of these conditions: -kidney disease -latex allergy -ongoing radiation therapy -sickle cell disease -skin reactions to acrylic adhesives (On-Body Injector only) -an unusual or allergic reaction to pegfilgrastim, filgrastim, other medicines, foods, dyes, or preservatives -pregnant or trying to get pregnant -breast-feeding How should I use this medicine? This medicine is for injection under the skin. If you get this medicine at home, you will be taught how to prepare and give the pre-filled syringe or how to use the On-body Injector. Refer to the patient Instructions for Use for detailed instructions. Use exactly as directed. Take your medicine at regular intervals. Do not take your medicine more often than directed. It is important that you put your used needles and syringes in a special sharps container. Do not put them in a trash can. If you do not have a sharps container, call your pharmacist or healthcare provider to get one. Talk to your pediatrician regarding the use of this medicine in children. While this drug may be prescribed for selected conditions, precautions do apply. Overdosage: If you think you have taken too much of this medicine contact a poison control  center or emergency room at once. NOTE: This medicine is only for you. Do not share this medicine with others. What if I miss a dose? It is important not to miss your dose. Call your doctor or health care professional if you miss your dose. If you miss a dose due to an On-body Injector failure or leakage, a new dose should be administered as soon as possible using a single prefilled syringe for manual use. What may interact with this medicine? Interactions have not been studied. Give your health care provider a list of all the medicines, herbs, non-prescription drugs, or dietary supplements you use. Also tell them if you smoke, drink alcohol, or use illegal drugs. Some items may interact with your medicine. This list may not describe all possible interactions. Give your health care provider a list of all the medicines, herbs, non-prescription drugs, or dietary supplements you use. Also tell them if you smoke, drink alcohol, or use illegal drugs. Some items may interact with your medicine. What should I watch for while using this medicine? You may need blood work done while you are taking this medicine. If you are going to need a MRI, CT scan, or other procedure, tell your doctor that you are using this medicine (On-Body Injector only). What side effects may I notice from receiving this medicine? Side effects that you should report to your doctor or health care professional as soon as possible: -allergic reactions like skin rash, itching or hives, swelling of the face, lips, or tongue -dizziness -fever -pain, redness, or irritation at site where injected -pinpoint red spots on the skin -red or dark-brown urine -shortness of breath or breathing problems -stomach or   side pain, or pain at the shoulder -swelling -tiredness -trouble passing urine or change in the amount of urine Side effects that usually do not require medical attention (report to your doctor or health care professional if they  continue or are bothersome): -bone pain -muscle pain This list may not describe all possible side effects. Call your doctor for medical advice about side effects. You may report side effects to FDA at 1-800-FDA-1088. Where should I keep my medicine? Keep out of the reach of children. Store pre-filled syringes in a refrigerator between 2 and 8 degrees C (36 and 46 degrees F). Do not freeze. Keep in carton to protect from light. Throw away this medicine if it is left out of the refrigerator for more than 48 hours. Throw away any unused medicine after the expiration date. NOTE: This sheet is a summary. It may not cover all possible information. If you have questions about this medicine, talk to your doctor, pharmacist, or health care provider.  2017 Elsevier/Gold Standard (2014-04-08 14:30:14)  

## 2016-05-28 ENCOUNTER — Other Ambulatory Visit: Payer: Self-pay | Admitting: Hematology and Oncology

## 2016-05-28 DIAGNOSIS — C8338 Diffuse large B-cell lymphoma, lymph nodes of multiple sites: Secondary | ICD-10-CM

## 2016-05-31 ENCOUNTER — Other Ambulatory Visit: Payer: Self-pay | Admitting: Hematology and Oncology

## 2016-05-31 ENCOUNTER — Telehealth: Payer: Self-pay

## 2016-05-31 NOTE — Telephone Encounter (Signed)
Called IR diagnostic's about IT chemo on 3-8 while patient is a patient in the hospital. They have the time blocked off on 3-8 at 1100. They said the order will have to be re-entered when patient is admitted so they can charge for the procedure.

## 2016-05-31 NOTE — Telephone Encounter (Signed)
-----   Message from Heath Lark, MD sent at 05/31/2016  8:10 AM EST ----- Regarding: IT chemo I'm not sure if I spoke with you but he had an "agreement" with IR to do IT chemo on 3/8 in the hospital. I placed order as requested but did not see it in his appt. Just want to make sure it is scheduled with IR  Thanks

## 2016-06-04 ENCOUNTER — Other Ambulatory Visit (HOSPITAL_BASED_OUTPATIENT_CLINIC_OR_DEPARTMENT_OTHER): Payer: Medicare Other

## 2016-06-04 ENCOUNTER — Telehealth: Payer: Self-pay | Admitting: Hematology and Oncology

## 2016-06-04 ENCOUNTER — Encounter: Payer: Self-pay | Admitting: Hematology and Oncology

## 2016-06-04 ENCOUNTER — Ambulatory Visit (HOSPITAL_BASED_OUTPATIENT_CLINIC_OR_DEPARTMENT_OTHER): Payer: Medicare Other | Admitting: Hematology and Oncology

## 2016-06-04 VITALS — BP 117/50 | HR 105 | Temp 98.2°F | Resp 17 | Ht 71.0 in | Wt 195.6 lb

## 2016-06-04 DIAGNOSIS — C8338 Diffuse large B-cell lymphoma, lymph nodes of multiple sites: Secondary | ICD-10-CM

## 2016-06-04 DIAGNOSIS — D6481 Anemia due to antineoplastic chemotherapy: Secondary | ICD-10-CM

## 2016-06-04 DIAGNOSIS — T451X5A Adverse effect of antineoplastic and immunosuppressive drugs, initial encounter: Secondary | ICD-10-CM

## 2016-06-04 DIAGNOSIS — C8510 Unspecified B-cell lymphoma, unspecified site: Secondary | ICD-10-CM

## 2016-06-04 LAB — COMPREHENSIVE METABOLIC PANEL
ALBUMIN: 3.4 g/dL — AB (ref 3.5–5.0)
ALK PHOS: 64 U/L (ref 40–150)
ALT: 11 U/L (ref 0–55)
ANION GAP: 9 meq/L (ref 3–11)
AST: 11 U/L (ref 5–34)
BUN: 10.5 mg/dL (ref 7.0–26.0)
CO2: 29 meq/L (ref 22–29)
CREATININE: 0.8 mg/dL (ref 0.7–1.3)
Calcium: 9.3 mg/dL (ref 8.4–10.4)
Chloride: 105 mEq/L (ref 98–109)
GLUCOSE: 136 mg/dL (ref 70–140)
Potassium: 4.5 mEq/L (ref 3.5–5.1)
Sodium: 142 mEq/L (ref 136–145)
TOTAL PROTEIN: 5.8 g/dL — AB (ref 6.4–8.3)

## 2016-06-04 LAB — CBC WITH DIFFERENTIAL/PLATELET
BASO%: 0.7 % (ref 0.0–2.0)
Basophils Absolute: 0.1 10*3/uL (ref 0.0–0.1)
EOS%: 0.3 % (ref 0.0–7.0)
Eosinophils Absolute: 0 10*3/uL (ref 0.0–0.5)
HEMATOCRIT: 29.8 % — AB (ref 38.4–49.9)
HGB: 9.6 g/dL — ABNORMAL LOW (ref 13.0–17.1)
LYMPH#: 2.1 10*3/uL (ref 0.9–3.3)
LYMPH%: 18.1 % (ref 14.0–49.0)
MCH: 32.2 pg (ref 27.2–33.4)
MCHC: 32.2 g/dL (ref 32.0–36.0)
MCV: 100 fL — ABNORMAL HIGH (ref 79.3–98.0)
MONO#: 1.2 10*3/uL — AB (ref 0.1–0.9)
MONO%: 10.8 % (ref 0.0–14.0)
NEUT%: 70.1 % (ref 39.0–75.0)
NEUTROS ABS: 8.1 10*3/uL — AB (ref 1.5–6.5)
Platelets: 248 10*3/uL (ref 140–400)
RBC: 2.98 10*6/uL — ABNORMAL LOW (ref 4.20–5.82)
RDW: 19 % — AB (ref 11.0–14.6)
WBC: 11.5 10*3/uL — AB (ref 4.0–10.3)
nRBC: 0 % (ref 0–0)

## 2016-06-04 NOTE — Progress Notes (Signed)
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 Stable ectasia or post stenotic dilatation of the right external iliac artery measuring up to 1.5 cm. Extensive atherosclerotic disease involving the abdominal aorta with a chronic intimal flap or short segment dissection in the infrarenal abdominal aorta. Chronic occlusion of the left common iliac artery. Slowly enlarging retroperitoneal lymph nodes as described. An indolent neoplastic or lymphoproliferative process cannot be excluded      01/06/2016 Imaging    Ct abdomen and pelvis showed new extensive centrally necrotic retroperitoneal and bilateral pelvic lymphadenopathy. Differential includes lymphoproliferative condition (lymphoma/leukemia) or infectious lymphadenitis (such as due to bacterial, viral or mycobacterial etiologies). Squamous cell carcinoma metastases can have a similar appearance, although this is less likely given the location. Clinical and laboratory evaluation for a lymphoproliferative condition and tissue sampling is advised. 2. Aortic atherosclerosis. Stable 3.1 cm infrarenal abdominal aortic aneurysm      01/27/2016 Imaging    CH chest showed no acute cardiopulmonary disease. Known stable retrocrural and periaortic adenopathy. Oval density over the left neck base measuring 2.5 x 4.5 cm likely due partially to prominent internal jugular vein, although cannot exclude adenopathy in this region. Recommend neck CT with contrast. Three  vessel atherosclerotic coronary artery disease. Few small thyroid nodules with the largest measuring 1.2 cm over the left lobe. 2.4 cm left renal cyst. Sub cm right renal hypodensity unchanged and too small to characterize but likely a cyst. Minimal cholelithiasis.      01/31/2016 Imaging    ECHO showed: Left ventricle: The cavity size was normal. Systolic function was normal. The estimated ejection fraction was in the range of 60% to 65%. Wall motion was normal; there were no regional wall motion abnormalities. There was an increased relative contribution of atrial contraction to ventricular filling. Doppler parameters are consistent with abnormal left ventricular relaxation (grade 1 diastolic dysfunction).      02/07/2016 Procedure    He had imaging guided biopsy of supraclavicular lymph node      02/07/2016 Pathology Results    Accession: [id number] Core biopsies reveal diffuse areas of large atypical lymphoid cells. There are multiple cells with very large irregular nuclei or multinucleated cells. Immunohistochemistry reveals the cells are positive for CD20, CD45, bcl-2, and bcl-6. They are negative for CD10, CD30, and ALK. CD3, CD4, CD5 and CD8 reveal scattered T-cells. Ki-67 reveals an elevated proliferation rate (95%). The cells are negative for cytokeratin 7, cytokeratin 20, TTF-1, CDX-2, HMB45, SOX11, and MelanA. Overall, these findings are consistent with a diffuse large B-cell lymphoma      02/12/2016 - 02/21/2016 Hospital Admission    He was admitted to the hospital with sepsis. He received cycle 1 of chemotherapy will hospitalized      02/14/2016 Imaging    Ct abdomen and pelvis showed widespread retrocrural, retroperitoneal, and bilateral pelvic lymphadenopathy, waxing/waning when compared to recent CT, as above. Mild right hydronephrosis, likely secondary to extrinsic compression by right retroperitoneal lymphadenopathy, with  associated diminished enhancement of the right kidney.  Spleen is normal in size.      02/16/2016 Bone Marrow Biopsy    Bone marrow biopsy was negative for lymphoma involvement. Cytogenetics came back abnormal: -Y in 45% of cell lines      02/16/2016 Procedure    He had port placement      02/16/2016 - 02/21/2016 Chemotherapy    He received cycle 1 of R-EPOCH       03/08/2016 - 03/12/2016 Hospital Admission    The patient received cycle 2 of chemotherapy in the hospital      03/27/2016 PET scan    Significant decrease in size of abdominal and pelvic lymphadenopathy compared to previous CT, which shows low-grade metabolic activity. No evidence of active lymphoma within the neck or chest. 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 Continued annual follow-up by CT is recommended.       04/03/2016 - 04/07/2016 Hospital Admission    The patient received cycle 3 of chemotherapy in the hospital      04/20/2016 Procedure    He underwent successful intrathecal injection of chemotherapy without complication      6/57/8469 Pathology Results    [id number] CSF fluid negative for malignant cells      04/24/2016 - 04/28/2016 Hospital Admission    The patient received cycle 4 of chemotherapy in the hospital      05/17/2016 Procedure    Successful intrathecal injection of methotrexate.       INTERVAL HISTORY: Please see below for problem oriented charting. He returns for further follow-up. He feels well. No recent infection. No lymphadenopathy. His energy level is excellent.  Denies recent constipation.  No headaches.  REVIEW OF SYSTEMS:   Constitutional: Denies fevers, chills or abnormal weight loss Eyes: Denies blurriness of vision Ears, nose, mouth, throat, and face: Denies mucositis or sore throat Respiratory: Denies cough, dyspnea or wheezes Cardiovascular: Denies palpitation, chest discomfort or lower extremity swelling Gastrointestinal:  Denies nausea, heartburn or change in bowel habits Skin: Denies abnormal skin  rashes Lymphatics: Denies new lymphadenopathy or easy bruising Neurological:Denies numbness, tingling or new weaknesses Behavioral/Psych: Mood is stable, no new changes  All other systems were reviewed with the patient and are negative.  I have reviewed the past medical history, past surgical history, social history and family history with the patient and they are unchanged from previous note.  ALLERGIES:  is allergic to bee venom; metformin and related; and sulfa drugs cross reactors.  MEDICATIONS:  Current Outpatient Prescriptions  Medication Sig Dispense Refill  . amLODipine (NORVASC) 10 MG tablet Take 10 mg by mouth every morning.     Marland Kitchen aspirin EC 81 MG tablet Take 81 mg by mouth every morning.     Marland Kitchen atorvastatin (LIPITOR) 40 MG tablet Take 40 mg by mouth every evening.     . lidocaine-prilocaine (EMLA) cream Apply 1 application topically as needed. (Patient taking differently: Apply 1 application topically daily as needed (port access). ) 30 g 6  . pioglitazone (ACTOS) 30 MG tablet Take 30 mg by mouth every morning.     . sennosides-docusate sodium (SENOKOT-S) 8.6-50 MG tablet Take 2 tablets by mouth 2 (two) times daily.      No current facility-administered medications for this visit.     PHYSICAL EXAMINATION: ECOG PERFORMANCE STATUS: 0 - Asymptomatic  Vitals:   06/04/16 1405  BP: (!) 117/50  Pulse: (!) 105  Resp: 17  Temp: 98.2 F (36.8 C)   Filed  Weights   06/04/16 1405  Weight: 195 lb 9.6 oz (88.7 kg)    GENERAL:alert, no distress and comfortable SKIN: skin color, texture, turgor are normal, no rashes or significant lesions EYES: normal, Conjunctiva are pink and non-injected, sclera clear OROPHARYNX:no exudate, no erythema and lips, buccal mucosa, and tongue normal  NECK: supple, thyroid normal size, non-tender, without nodularity LYMPH:  no palpable lymphadenopathy in the cervical, axillary or inguinal LUNGS: clear to auscultation and percussion with normal  breathing effort HEART: regular rate & rhythm and no murmurs and no lower extremity edema ABDOMEN:abdomen soft, non-tender and normal bowel sounds Musculoskeletal:no cyanosis of digits and no clubbing  NEURO: alert & oriented x 3 with fluent speech, no focal motor/sensory deficits  LABORATORY DATA:  I have reviewed the data as listed    Component Value Date/Time   NA 142 06/04/2016 1346   K 4.5 06/04/2016 1346   CL 107 05/16/2016 0430   CO2 29 06/04/2016 1346   GLUCOSE 136 06/04/2016 1346   BUN 10.5 06/04/2016 1346   CREATININE 0.8 06/04/2016 1346   CALCIUM 9.3 06/04/2016 1346   PROT 5.8 (L) 06/04/2016 1346   ALBUMIN 3.4 (L) 06/04/2016 1346   AST 11 06/04/2016 1346   ALT 11 06/04/2016 1346   ALKPHOS 64 06/04/2016 1346   BILITOT <0.22 06/04/2016 1346   GFRNONAA >60 05/16/2016 0430   GFRAA >60 05/16/2016 0430    No results found for: SPEP, UPEP  Lab Results  Component Value Date   WBC 11.5 (H) 06/04/2016   NEUTROABS 8.1 (H) 06/04/2016   HGB 9.6 (L) 06/04/2016   HCT 29.8 (L) 06/04/2016   MCV 100.0 (H) 06/04/2016   PLT 248 06/04/2016      Chemistry      Component Value Date/Time   NA 142 06/04/2016 1346   K 4.5 06/04/2016 1346   CL 107 05/16/2016 0430   CO2 29 06/04/2016 1346   BUN 10.5 06/04/2016 1346   CREATININE 0.8 06/04/2016 1346      Component Value Date/Time   CALCIUM 9.3 06/04/2016 1346   ALKPHOS 64 06/04/2016 1346   AST 11 06/04/2016 1346   ALT 11 06/04/2016 1346   BILITOT <0.22 06/04/2016 1346       RADIOGRAPHIC STUDIES: I have personally reviewed the radiological images as listed and agreed with the findings in the report. Dg Fluoro Guided Loc Of Needle/cath Tip For Spinal Inject Lt  Result Date: 05/17/2016 CLINICAL DATA:  Diffuse large B-cell lymphoma EXAM: DIAGNOSTIC LUMBAR PUNCTURE UNDER FLUOROSCOPIC GUIDANCE FLUOROSCOPY TIME:  Fluoroscopy Time:  0 minutes 39 second Radiation Exposure Index (if provided by the fluoroscopic device): Number of  Acquired Spot Images: 0 PROCEDURE: Informed consent was obtained from the patient prior to the procedure, including potential complications of headache, allergy, and pain. With the patient prone, the lower back was prepped with Betadine. 1% Lidocaine was used for local anesthesia. Lumbar puncture was performed at the L2-3 level using a 20 gauge needle with return of clear CSF with an opening pressure of 20 cm water. Intrathecal methotrexate prepared by the pharmacist was injected into the subarachnoid space. IMPRESSION: Successful intrathecal injection of methotrexate. Electronically Signed   By: Franchot Gallo M.D.   On: 05/17/2016 12:35    ASSESSMENT & PLAN:  Diffuse large B-cell lymphoma of lymph nodes of multiple regions [facility]) Clinically, the patient has excellent response to treatment with minimal side-effects He will be readmitted to the hospital on 06/05/16 for cycle 6 of chemotherapy He will  get second dose of intrathecal chemotherapy on 06/07/16 as inpatient. He will return to the cancer center on 3/122018 for G-CSF support He will receive his final dose of IT chemo around 06/28/2016. I plan to repeat staging PET CT scan on 06/25/2016 and see him back on 06/27/2016. We reviewed guidelines. Given his excellent response to treatment and good performance status now, he might be a candidate for consideration for bone marrow transplant as consolidation. If the bone marrow transplant team think this is not appropriate, we can also consider possible treatment with maintenance Revlimid.  Anemia due to antineoplastic chemotherapy This is likely due to recent treatment. The patient denies recent history of bleeding such as epistaxis, hematuria or hematochezia. He is asymptomatic from the anemia. I will observe for now.  He does not require transfusion now. I will continue the chemotherapy at current dose without dosage adjustment.  If the anemia gets progressive worse in the future, I might have to delay his  treatment or adjust the chemotherapy dose.    Orders Placed This Encounter  Procedures  . NM PET Image Restag (PS) Skull Base To Thigh    Standing Status:   Future    Standing Expiration Date:   07/09/2017    Order Specific Question:   Reason for exam:    Answer:   staging lymphoma assess response to Rx    Order Specific Question:   Preferred imaging location?    Answer:   [facility]   All questions were answered. The patient knows to call the clinic with any problems, questions or concerns. No barriers to learning was detected. I spent 20 minutes counseling the patient face to face. The total time spent in the appointment was 30 minutes and more than 50% was on counseling and review of test results     Heath Lark, MD 06/04/2016 2:33 PM

## 2016-06-04 NOTE — Assessment & Plan Note (Signed)
Clinically, the patient has excellent response to treatment with minimal side-effects He will be readmitted to the hospital on 06/05/16 for cycle 6 of chemotherapy He will get second dose of intrathecal chemotherapy on 06/07/16 as inpatient. He will return to the cancer center on 3/122018 for G-CSF support He will receive his final dose of IT chemo around 06/28/2016. I plan to repeat staging PET CT scan on 06/25/2016 and see him back on 06/27/2016. We reviewed guidelines. Given his excellent response to treatment and good performance status now, he might be a candidate for consideration for bone marrow transplant as consolidation. If the bone marrow transplant team think this is not appropriate, we can also consider possible treatment with maintenance Revlimid.

## 2016-06-04 NOTE — Telephone Encounter (Signed)
Appointments scheduled per 3/5 LOS. Patient given AVS report and calendars with future scheduled appointments. °

## 2016-06-04 NOTE — Assessment & Plan Note (Signed)
This is likely due to recent treatment. The patient denies recent history of bleeding such as epistaxis, hematuria or hematochezia. He is asymptomatic from the anemia. I will observe for now.  He does not require transfusion now. I will continue the chemotherapy at current dose without dosage adjustment.  If the anemia gets progressive worse in the future, I might have to delay his treatment or adjust the chemotherapy dose.  

## 2016-06-05 ENCOUNTER — Inpatient Hospital Stay (HOSPITAL_COMMUNITY)
Admission: RE | Admit: 2016-06-05 | Discharge: 2016-06-09 | DRG: 847 | Disposition: A | Discharge status: Home / Self Care | Payer: Medicare Other | Source: Ambulatory Visit | Attending: Hematology and Oncology | Admitting: Hematology and Oncology

## 2016-06-05 ENCOUNTER — Encounter (HOSPITAL_COMMUNITY): Payer: Self-pay

## 2016-06-05 DIAGNOSIS — E119 Type 2 diabetes mellitus without complications: Secondary | ICD-10-CM | POA: Diagnosis present

## 2016-06-05 DIAGNOSIS — Z7982 Long term (current) use of aspirin: Secondary | ICD-10-CM

## 2016-06-05 DIAGNOSIS — Z8679 Personal history of other diseases of the circulatory system: Secondary | ICD-10-CM | POA: Diagnosis not present

## 2016-06-05 DIAGNOSIS — Z833 Family history of diabetes mellitus: Secondary | ICD-10-CM

## 2016-06-05 DIAGNOSIS — Z8673 Personal history of transient ischemic attack (TIA), and cerebral infarction without residual deficits: Secondary | ICD-10-CM

## 2016-06-05 DIAGNOSIS — I10 Essential (primary) hypertension: Secondary | ICD-10-CM | POA: Diagnosis present

## 2016-06-05 DIAGNOSIS — C8330 Diffuse large B-cell lymphoma, unspecified site: Secondary | ICD-10-CM

## 2016-06-05 DIAGNOSIS — Z91030 Bee allergy status: Secondary | ICD-10-CM

## 2016-06-05 DIAGNOSIS — Z87891 Personal history of nicotine dependence: Secondary | ICD-10-CM | POA: Diagnosis not present

## 2016-06-05 DIAGNOSIS — K5900 Constipation, unspecified: Secondary | ICD-10-CM | POA: Diagnosis present

## 2016-06-05 DIAGNOSIS — K219 Gastro-esophageal reflux disease without esophagitis: Secondary | ICD-10-CM | POA: Diagnosis present

## 2016-06-05 DIAGNOSIS — T451X5A Adverse effect of antineoplastic and immunosuppressive drugs, initial encounter: Secondary | ICD-10-CM | POA: Diagnosis present

## 2016-06-05 DIAGNOSIS — Z5111 Encounter for antineoplastic chemotherapy: Secondary | ICD-10-CM | POA: Diagnosis not present

## 2016-06-05 DIAGNOSIS — E785 Hyperlipidemia, unspecified: Secondary | ICD-10-CM | POA: Diagnosis present

## 2016-06-05 DIAGNOSIS — Z888 Allergy status to other drugs, medicaments and biological substances status: Secondary | ICD-10-CM

## 2016-06-05 DIAGNOSIS — D6481 Anemia due to antineoplastic chemotherapy: Secondary | ICD-10-CM | POA: Diagnosis present

## 2016-06-05 DIAGNOSIS — C8338 Diffuse large B-cell lymphoma, lymph nodes of multiple sites: Secondary | ICD-10-CM | POA: Diagnosis present

## 2016-06-05 DIAGNOSIS — Z882 Allergy status to sulfonamides status: Secondary | ICD-10-CM

## 2016-06-05 DIAGNOSIS — Z8572 Personal history of non-Hodgkin lymphomas: Secondary | ICD-10-CM | POA: Diagnosis present

## 2016-06-05 DIAGNOSIS — Z5112 Encounter for antineoplastic immunotherapy: Secondary | ICD-10-CM | POA: Diagnosis not present

## 2016-06-05 DIAGNOSIS — R600 Localized edema: Secondary | ICD-10-CM | POA: Diagnosis not present

## 2016-06-05 MED ORDER — ALTEPLASE 2 MG IJ SOLR
2.0000 mg | Freq: Once | INTRAMUSCULAR | Status: DC | PRN
Start: 2016-06-05 — End: 2016-06-09

## 2016-06-05 MED ORDER — ALUM & MAG HYDROXIDE-SIMETH 200-200-20 MG/5ML PO SUSP: 60.0000 mL | ORAL | Status: DC | PRN

## 2016-06-05 MED ORDER — COLD PACK MISC ONCOLOGY: 1.0000 | Freq: Once | Status: DC | PRN

## 2016-06-05 MED ORDER — ONDANSETRON HCL 40 MG/20ML IJ SOLN
Freq: Once | INTRAMUSCULAR | Status: AC
  Administered 2016-06-05: 8 mg via INTRAVENOUS
  Filled 2016-06-05: qty 4

## 2016-06-05 MED ORDER — SENNOSIDES-DOCUSATE SODIUM 8.6-50 MG PO TABS
2.0000 | ORAL_TABLET | Freq: Two times a day (BID) | ORAL | Status: DC
Start: 2016-06-05 — End: 2016-06-09
  Administered 2016-06-05 – 2016-06-09 (×8): 2 via ORAL
  Filled 2016-06-05 (×8): qty 2

## 2016-06-05 MED ORDER — VINCRISTINE SULFATE CHEMO INJECTION 1 MG/ML
Freq: Once | INTRAVENOUS | Status: AC
  Administered 2016-06-05: 15:00:00 via INTRAVENOUS
  Filled 2016-06-05: qty 11

## 2016-06-05 MED ORDER — ENOXAPARIN SODIUM 40 MG/0.4ML ~~LOC~~ SOLN
40.0000 mg | SUBCUTANEOUS | Status: DC
  Administered 2016-06-05: 40 mg via SUBCUTANEOUS
  Filled 2016-06-05: qty 0.4

## 2016-06-05 MED ORDER — DIPHENHYDRAMINE HCL 50 MG/ML IJ SOLN: 25.0000 mg | Freq: Once | INTRAMUSCULAR | Status: DC | PRN

## 2016-06-05 MED ORDER — ONDANSETRON HCL 4 MG/2ML IJ SOLN: 4.0000 mg | Freq: Three times a day (TID) | INTRAMUSCULAR | Status: DC | PRN

## 2016-06-05 MED ORDER — FAMOTIDINE IN NACL 20-0.9 MG/50ML-% IV SOLN: 20.0000 mg | Freq: Once | INTRAVENOUS | Status: DC | PRN

## 2016-06-05 MED ORDER — ACETAMINOPHEN 325 MG PO TABS
650.0000 mg | ORAL_TABLET | ORAL | Status: DC | PRN
Start: 2016-06-05 — End: 2016-06-09

## 2016-06-05 MED ORDER — SODIUM CHLORIDE 0.9 % IV SOLN: INTRAVENOUS | Status: DC

## 2016-06-05 MED ORDER — HEPARIN SOD (PORK) LOCK FLUSH 100 UNIT/ML IV SOLN
500.0000 [IU] | Freq: Once | INTRAVENOUS | Status: DC | PRN
  Filled 2016-06-05: qty 5

## 2016-06-05 MED ORDER — SODIUM CHLORIDE 0.9 % IV SOLN
375.0000 mg/m2 | Freq: Once | INTRAVENOUS | Status: AC
  Administered 2016-06-05: 800 mg via INTRAVENOUS
  Filled 2016-06-05: qty 50

## 2016-06-05 MED ORDER — ATORVASTATIN CALCIUM 40 MG PO TABS
40.0000 mg | ORAL_TABLET | Freq: Every evening | ORAL | Status: DC
  Administered 2016-06-05 – 2016-06-08 (×4): 40 mg via ORAL
  Filled 2016-06-05 (×4): qty 1

## 2016-06-05 MED ORDER — HEPARIN SOD (PORK) LOCK FLUSH 100 UNIT/ML IV SOLN: 250.0000 [IU] | Freq: Once | INTRAVENOUS | Status: DC | PRN

## 2016-06-05 MED ORDER — HEPARIN SOD (PORK) LOCK FLUSH 100 UNIT/ML IV SOLN: 500.0000 [IU] | Freq: Once | INTRAVENOUS | Status: DC | PRN

## 2016-06-05 MED ORDER — DIPHENHYDRAMINE HCL 50 MG PO CAPS
50.0000 mg | ORAL_CAPSULE | Freq: Once | ORAL | Status: AC
  Administered 2016-06-05: 50 mg via ORAL
  Filled 2016-06-05: qty 1

## 2016-06-05 MED ORDER — EPINEPHRINE PF 1 MG/10ML IJ SOSY: 0.2500 mg | PREFILLED_SYRINGE | Freq: Once | INTRAMUSCULAR | Status: DC | PRN

## 2016-06-05 MED ORDER — LIDOCAINE-PRILOCAINE 2.5-2.5 % EX CREA: 1.0000 "application " | TOPICAL_CREAM | Freq: Every day | CUTANEOUS | Status: DC | PRN

## 2016-06-05 MED ORDER — SODIUM CHLORIDE 0.9% FLUSH: 3.0000 mL | INTRAVENOUS | Status: DC | PRN

## 2016-06-05 MED ORDER — POLYETHYLENE GLYCOL 3350 17 G PO PACK
17.0000 g | PACK | Freq: Every day | ORAL | Status: DC
  Administered 2016-06-07 – 2016-06-09 (×3): 17 g via ORAL
  Filled 2016-06-05 (×5): qty 1

## 2016-06-05 MED ORDER — SODIUM CHLORIDE 0.9 % IV SOLN
INTRAVENOUS | Status: DC
  Administered 2016-06-05: 11:00:00 via INTRAVENOUS
  Administered 2016-06-06: 1000 mL via INTRAVENOUS
  Administered 2016-06-06 – 2016-06-08 (×2): via INTRAVENOUS

## 2016-06-05 MED ORDER — GUAIFENESIN-DM 100-10 MG/5ML PO SYRP: 10.0000 mL | ORAL_SOLUTION | ORAL | Status: DC | PRN

## 2016-06-05 MED ORDER — SODIUM CHLORIDE 0.9 % IV SOLN
8.0000 mg | Freq: Three times a day (TID) | INTRAVENOUS | Status: DC | PRN
  Filled 2016-06-05: qty 4

## 2016-06-05 MED ORDER — EPINEPHRINE PF 1 MG/10ML IJ SOSY
0.2500 mg | PREFILLED_SYRINGE | Freq: Once | INTRAMUSCULAR | Status: DC | PRN
Start: 2016-06-05 — End: 2016-06-09

## 2016-06-05 MED ORDER — HOT PACK MISC ONCOLOGY: 1.0000 | Freq: Once | Status: DC | PRN

## 2016-06-05 MED ORDER — SODIUM CHLORIDE 0.9 % IV SOLN: Freq: Once | INTRAVENOUS | Status: DC

## 2016-06-05 MED ORDER — ACETAMINOPHEN 325 MG PO TABS
650.0000 mg | ORAL_TABLET | Freq: Once | ORAL | Status: AC
  Administered 2016-06-05: 650 mg via ORAL
  Filled 2016-06-05: qty 2

## 2016-06-05 MED ORDER — AMLODIPINE BESYLATE 10 MG PO TABS
10.0000 mg | ORAL_TABLET | Freq: Every day | ORAL | Status: DC
  Administered 2016-06-06 – 2016-06-09 (×4): 10 mg via ORAL
  Filled 2016-06-05 (×4): qty 1

## 2016-06-05 MED ORDER — METHYLPREDNISOLONE SODIUM SUCC 125 MG IJ SOLR: 125.0000 mg | Freq: Once | INTRAMUSCULAR | Status: DC | PRN

## 2016-06-05 MED ORDER — ONDANSETRON 4 MG PO TBDP: 4.0000 mg | ORAL_TABLET | Freq: Three times a day (TID) | ORAL | Status: DC | PRN

## 2016-06-05 MED ORDER — PIOGLITAZONE HCL 30 MG PO TABS
30.0000 mg | ORAL_TABLET | Freq: Every day | ORAL | Status: DC
  Administered 2016-06-06 – 2016-06-09 (×3): 30 mg via ORAL
  Filled 2016-06-05 (×5): qty 1

## 2016-06-05 MED ORDER — SODIUM CHLORIDE 0.9% FLUSH: 10.0000 mL | INTRAVENOUS | Status: DC | PRN

## 2016-06-05 MED ORDER — DIPHENHYDRAMINE HCL 50 MG/ML IJ SOLN
50.0000 mg | Freq: Once | INTRAMUSCULAR | Status: DC | PRN
Start: 2016-06-05 — End: 2016-06-09

## 2016-06-05 MED ORDER — ALBUTEROL SULFATE (2.5 MG/3ML) 0.083% IN NEBU: 2.5000 mg | INHALATION_SOLUTION | Freq: Once | RESPIRATORY_TRACT | Status: DC | PRN

## 2016-06-05 MED ORDER — ONDANSETRON HCL 4 MG PO TABS: 4.0000 mg | ORAL_TABLET | Freq: Three times a day (TID) | ORAL | Status: DC | PRN

## 2016-06-05 MED ORDER — SODIUM CHLORIDE 0.9 % IV SOLN: Freq: Once | INTRAVENOUS | Status: DC | PRN

## 2016-06-05 MED ORDER — ASPIRIN EC 81 MG PO TBEC
81.0000 mg | DELAYED_RELEASE_TABLET | Freq: Every day | ORAL | Status: DC
  Administered 2016-06-06 – 2016-06-09 (×4): 81 mg via ORAL
  Filled 2016-06-05 (×4): qty 1

## 2016-06-05 MED ORDER — EPINEPHRINE PF 1 MG/ML IJ SOLN
0.5000 mg | Freq: Once | INTRAMUSCULAR | Status: DC | PRN
Start: 2016-06-05 — End: 2016-06-09

## 2016-06-05 MED ORDER — SENNOSIDES-DOCUSATE SODIUM 8.6-50 MG PO TABS
1.0000 | ORAL_TABLET | Freq: Every evening | ORAL | Status: DC | PRN
  Filled 2016-06-05: qty 1

## 2016-06-05 NOTE — H&P (Signed)
[facility] ADMISSION NOTE  Patient Care Team: Wenda Low, MD as PCP - General (Internal Medicine)  CHIEF COMPLAINTS/PURPOSE OF ADMISSION Diffuse large B-cell lymphoma, admitted for cycle 6 of chemotherapy  HISTORY OF PRESENTING ILLNESS:  Samuel Berger 71 y.o. 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 Stable ectasia or post stenotic dilatation of the right external iliac artery measuring up to 1.5 cm. Extensive atherosclerotic disease involving the abdominal aorta with a chronic intimal flap or short segment dissection in the infrarenal abdominal aorta. Chronic occlusion of the left common iliac artery. Slowly enlarging retroperitoneal lymph nodes as described. An indolent neoplastic or lymphoproliferative process cannot be excluded      01/06/2016 Imaging    Ct abdomen and pelvis showed new extensive centrally necrotic retroperitoneal and bilateral pelvic lymphadenopathy. Differential includes lymphoproliferative condition (lymphoma/leukemia) or infectious lymphadenitis (such as due to bacterial, viral or mycobacterial etiologies). Squamous cell carcinoma metastases can have a similar appearance, although this is less likely given the location. Clinical and laboratory evaluation for a lymphoproliferative condition and tissue sampling is advised. 2. Aortic atherosclerosis. Stable 3.1 cm infrarenal abdominal aortic aneurysm      01/27/2016 Imaging    CH chest showed no acute cardiopulmonary disease. Known stable retrocrural and  periaortic adenopathy. Oval density over the left neck base measuring 2.5 x 4.5 cm likely due partially to prominent internal jugular vein, although cannot exclude adenopathy in this region. Recommend neck CT with contrast. Three vessel atherosclerotic coronary artery disease. Few small thyroid nodules with the largest measuring 1.2 cm over the left lobe. 2.4 cm left renal cyst. Sub cm right renal hypodensity unchanged and too small to characterize but likely a cyst. Minimal cholelithiasis.      01/31/2016 Imaging    ECHO showed: Left ventricle: The cavity size was normal. Systolic function was normal. The estimated ejection fraction was in the range of 60% to 65%. Wall motion was normal; there were no regional wall motion abnormalities. There was an increased relative contribution of atrial contraction to ventricular filling. Doppler parameters are consistent with abnormal left ventricular relaxation (grade 1 diastolic dysfunction).      02/07/2016 Procedure    He had imaging guided biopsy of supraclavicular lymph node      02/07/2016 Pathology Results    Accession: [id number] Core biopsies reveal diffuse areas of large atypical lymphoid cells. There are multiple cells with very large irregular nuclei or multinucleated cells. Immunohistochemistry reveals the cells are positive for CD20, CD45, bcl-2, and bcl-6. They are negative for CD10, CD30, and ALK. CD3, CD4, CD5 and CD8 reveal scattered T-cells. Ki-67 reveals an elevated proliferation rate (95%). The cells are negative for cytokeratin 7, cytokeratin 20, TTF-1, CDX-2, HMB45, SOX11, and MelanA. Overall, these findings are consistent with a diffuse large B-cell lymphoma      02/12/2016 - 02/21/2016 Hospital Admission    He was admitted to the hospital with sepsis. He received cycle 1 of chemotherapy will hospitalized      02/14/2016 Imaging  Ct abdomen and pelvis showed widespread retrocrural, retroperitoneal, and bilateral pelvic  lymphadenopathy, waxing/waning when compared to recent CT, as above. Mild right hydronephrosis, likely secondary to extrinsic compression by right retroperitoneal lymphadenopathy, with associated diminished enhancement of the right kidney. Spleen is normal in size.      02/16/2016 Bone Marrow Biopsy    Bone marrow biopsy was negative for lymphoma involvement. Cytogenetics came back abnormal: -Y in 45% of cell lines      02/16/2016 Procedure    He had port placement      02/16/2016 - 02/21/2016 Chemotherapy    He received cycle 1 of R-EPOCH       03/08/2016 - 03/12/2016 Hospital Admission    The patient received cycle 2 of chemotherapy in the hospital      03/27/2016 PET scan    Significant decrease in size of abdominal and pelvic lymphadenopathy compared to previous CT, which shows low-grade metabolic activity. No evidence of active lymphoma within the neck or chest. 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 Continued annual follow-up by CT is recommended.       04/03/2016 - 04/07/2016 Hospital Admission    The patient received cycle 3 of chemotherapy in the hospital      04/20/2016 Procedure    He underwent successful intrathecal injection of chemotherapy without complication      3/54/6568 Pathology Results    [id number] CSF fluid negative for malignant cells      04/24/2016 - 04/28/2016 Hospital Admission    The patient received cycle 4 of chemotherapy in the hospital      05/15/2016 - 05/19/2016 Hospital Admission    He received cycle 4 of chemotherapy in the hospital      05/17/2016 Procedure    Successful intrathecal injection of methotrexate.      He has no complaints since the last time I saw him, except for bilateral lower extremity edema.  It does not bother him  MEDICAL HISTORY:  Past Medical History:  Diagnosis Date  . 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 Carotid artery occlusion   . Dyslipidemia   . GERD (gastroesophageal reflux disease)   . Hepatitis     ? age 47 or 48  . Hyperlipidemia   . Hypertension   . Iliac artery aneurysm, right ([place])   . Stroke ([place])    01-05-2012   [id number]  . Type II or unspecified type diabetes mellitus without mention of complication, not stated as uncontrolled     SURGICAL HISTORY: Past Surgical History:  Procedure Laterality Date  . basket procedure    . CAROTID ENDARTERECTOMY    . ENDARTERECTOMY  02/08/2012   Procedure: ENDARTERECTOMY CAROTID;  Surgeon: Mal Misty, MD;  Location: [facility];  Service: Vascular;  Laterality: Right;  . IR GENERIC HISTORICAL  02/07/2016   IR US GUIDE BX ASP/DRAIN 02/07/2016 Greggory Keen, MD [name]  . IR GENERIC HISTORICAL  02/16/2016   IR FLUORO GUIDE PORT INSERTION RIGHT 02/16/2016 Aletta Edouard, MD [name]  . IR GENERIC HISTORICAL  02/16/2016   IR US GUIDE VASC ACCESS RIGHT 02/16/2016 Aletta Edouard, MD [name]  . KIDNEY STONE SURGERY     removal  . PATCH ANGIOPLASTY  02/08/2012   Procedure: PATCH ANGIOPLASTY;  Surgeon: Mal Misty, MD;  Location: [facility];  Service: Vascular;  Laterality: Right;  with dacron patch angioplasty  . TEE WITHOUT CARDIOVERSION  01/08/2012   Procedure: TRANSESOPHAGEAL ECHOCARDIOGRAM (TEE);  Surgeon: Lelon Perla, MD;  [facility];  Service: Cardiovascular;  Laterality: N/A;  . TONSILLECTOMY      SOCIAL HISTORY: Social History   Social History  . Marital status: Married    Spouse name: anne  . Number of children: 2  . Years of education: college   Occupational History  . retired    Social History Main Topics  . Smoking status: Former Smoker    Packs/day: 0.50    Years: 30.00    Types: Cigars    Quit date: 01/05/2012  . Smokeless tobacco: Former Systems developer  . Alcohol use 1.8 oz/week    1 Glasses of wine, 1 Cans of beer, 1 Shots of liquor per week     Comment: quit 2 months..Patient drinks coffee daily.OCCASIONAL DRINKER  . Drug use: No  . Sexual activity: Not Currently   Other Topics Concern  .  Not on file   Social History Narrative  . No narrative on file    FAMILY HISTORY: Family History  Problem Relation Age of Onset  . Diabetes Mother   . Lung cancer Father     ALLERGIES:  is allergic to bee venom; metformin and related; and sulfa drugs cross reactors.  MEDICATIONS:  Current Facility-Administered Medications  Medication Dose Route Frequency Provider Last Rate Last Dose  . 0.9 %  sodium chloride infusion   Intravenous Once Heath Lark, MD      . 0.9 %  sodium chloride infusion   Intravenous Once PRN Heath Lark, MD      . 0.9 %  sodium chloride infusion   Intravenous Continuous Heath Lark, MD      . acetaminophen (TYLENOL) tablet 650 mg  650 mg Oral Once Heath Lark, MD      . acetaminophen (TYLENOL) tablet 650 mg  650 mg Oral Q4H PRN Majorie Santee, MD      . albuterol (PROVENTIL) (2.5 MG/3ML) 0.083% nebulizer solution 2.5 mg  2.5 mg Nebulization Once PRN Heath Lark, MD      . alteplase (CATHFLO ACTIVASE) injection 2 mg  2 mg Intracatheter Once PRN Heath Lark, MD      . alum & mag hydroxide-simeth (MAALOX/MYLANTA) 200-200-20 MG/5ML suspension 60 mL  60 mL Oral Q4H PRN Johntae Broxterman, MD      . amLODipine (NORVASC) tablet 10 mg  10 mg Oral Lora Havens, MD      . Derrill Memo ON 06/06/2016] aspirin EC tablet 81 mg  81 mg Oral Daily Sarya Linenberger, MD      . atorvastatin (LIPITOR) tablet 40 mg  40 mg Oral QPM Cobe Viney, MD      . diphenhydrAMINE (BENADRYL) capsule 50 mg  50 mg Oral Once Heath Lark, MD      . diphenhydrAMINE (BENADRYL) injection 25 mg  25 mg Intravenous Once PRN Heath Lark, MD      . diphenhydrAMINE (BENADRYL) injection 50 mg  50 mg Intravenous Once PRN Heath Lark, MD      . enoxaparin (LOVENOX) injection 40 mg  40 mg Subcutaneous Q24H Jaselle Pryer, MD      . EPINEPHrine (ADRENALIN) 0.5 mg  0.5 mg Subcutaneous Once PRN Heath Lark, MD      . EPINEPHrine (ADRENALIN) 0.5 mg  0.5 mg Subcutaneous Once PRN Heath Lark, MD      . EPINEPHrine (ADRENALIN) 1 MG/10ML injection 0.25 mg   0.25 mg Intravenous Once PRN Heath Lark, MD      . EPINEPHrine (ADRENALIN) 1 MG/10ML injection 0.25 mg  0.25 mg Intravenous  Once PRN Heath Lark, MD      . famotidine (PEPCID) IVPB 20 mg premix  20 mg Intravenous Once PRN Heath Lark, MD      . guaiFENesin-dextromethorphan (ROBITUSSIN DM) 100-10 MG/5ML syrup 10 mL  10 mL Oral Q4H PRN Heath Lark, MD      . heparin lock flush 100 unit/mL  500 Units Intracatheter Once PRN Heath Lark, MD      . heparin lock flush 100 unit/mL  250 Units Intracatheter Once PRN Heath Lark, MD      . lidocaine-prilocaine (EMLA) cream 1 application  1 application Topical Daily PRN Heath Lark, MD      . methylPREDNISolone sodium succinate (SOLU-MEDROL) 125 mg/2 mL injection 125 mg  125 mg Intravenous Once PRN Heath Lark, MD      . ondansetron (ZOFRAN) tablet 4-8 mg  4-8 mg Oral Q8H PRN Heath Lark, MD       Or  . ondansetron (ZOFRAN-ODT) disintegrating tablet 4-8 mg  4-8 mg Oral Q8H PRN Heath Lark, MD       Or  . ondansetron (ZOFRAN) injection 4 mg  4 mg Intravenous Q8H PRN Heath Lark, MD       Or  . ondansetron (ZOFRAN) 8 mg in sodium chloride 0.9 % 50 mL IVPB  8 mg Intravenous Q8H PRN Roxie Kreeger, MD      . pioglitazone (ACTOS) tablet 30 mg  30 mg Oral BH-q7a Heath Lark, MD      . polyethylene glycol (MIRALAX / GLYCOLAX) packet 17 g  17 g Oral Daily Aniqua Briere, MD      . riTUXimab (RITUXAN) 800 mg in sodium chloride 0.9 % 250 mL (2.4242 mg/mL) chemo infusion  375 mg/m2 (Treatment Plan Recorded) Intravenous Once Heath Lark, MD      . senna-docusate (Senokot-S) tablet 1 tablet  1 tablet Oral QHS PRN Heath Lark, MD      . senna-docusate (Senokot-S) tablet 2 tablet  2 tablet Oral BID Mosella Kasa, MD      . sodium chloride flush (NS) 0.9 % injection 10 mL  10 mL Intracatheter PRN Joaopedro Eschbach, MD      . sodium chloride flush (NS) 0.9 % injection 3 mL  3 mL Intravenous PRN Heath Lark, MD        REVIEW OF SYSTEMS:   Constitutional: Denies fevers, chills or abnormal night  sweats Eyes: Denies blurriness of vision, double vision or watery eyes Ears, nose, mouth, throat, and face: Denies mucositis or sore throat Respiratory: Denies cough, dyspnea or wheezes Cardiovascular: Denies palpitation, chest discomfort  Gastrointestinal:  Denies nausea, heartburn or change in bowel habits Skin: Denies abnormal skin rashes Lymphatics: Denies new lymphadenopathy or easy bruising Neurological:Denies numbness, tingling or new weaknesses Behavioral/Psych: Mood is stable, no new changes  All other systems were reviewed with the patient and are negative.  PHYSICAL EXAMINATION: ECOG PERFORMANCE STATUS: 0 - Asymptomatic  Vitals:   06/05/16 0810  BP: 137/72  Pulse: 100  Resp: 16  Temp: 98.1 F (36.7 C)   Filed Weights   06/05/16 0810  Weight: 190 lb 12.8 oz (86.5 kg)    GENERAL:alert, no distress and comfortable SKIN: skin color, texture, turgor are normal, no rashes or significant lesions EYES: normal, conjunctiva are pink and non-injected, sclera clear OROPHARYNX:no exudate, no erythema and lips, buccal mucosa, and tongue normal  NECK: supple, thyroid normal size, non-tender, without nodularity LYMPH:  no palpable lymphadenopathy in the cervical, axillary or inguinal LUNGS: clear to auscultation  and percussion with normal breathing effort HEART: regular rate & rhythm and no murmurs with mild bilateral lower extremity edema ABDOMEN:abdomen soft, non-tender and normal bowel sounds Musculoskeletal:no cyanosis of digits and no clubbing  PSYCH: alert & oriented x 3 with fluent speech NEURO: no focal motor/sensory deficits  LABORATORY DATA:  I have reviewed the data as listed Lab Results  Component Value Date   WBC 11.5 (H) 06/04/2016   HGB 9.6 (L) 06/04/2016   HCT 29.8 (L) 06/04/2016   MCV 100.0 (H) 06/04/2016   PLT 248 06/04/2016    Recent Labs  04/05/16 0403  04/26/16 0424 05/14/16 1402 05/16/16 0430 06/04/16 1346  NA 138  < > 137 142 140 142  K  4.1  < > 4.4 4.3 4.4 4.5  CL 104  --  104  --  107  --   CO2 26  < > 26 31* 28 29  GLUCOSE 160*  < > 178* 125 157* 136  BUN 20  < > 19 12.4 13 10.5  CREATININE 0.66  < > 0.54* 0.8 0.62 0.8  CALCIUM 8.7*  < > 8.9 9.7 8.9 9.3  GFRNONAA >60  --  >60  --  >60  --   GFRAA >60  --  >60  --  >60  --   PROT 5.7*  < > 6.0* 5.9* 5.5* 5.8*  ALBUMIN 3.2*  < > 3.3* 3.2* 3.0* 3.4*  AST 17  < > 18 12 13* 11  ALT 10*  < > 12* 11 12* 11  ALKPHOS 42  < > 44 61 45 64  BILITOT 0.2*  < > 0.4 <0.22 0.1* <0.22  < > = values in this interval not displayed.  RADIOGRAPHIC STUDIES: I have personally reviewed the radiological images as listed and agreed with the findings in the report. Dg Fluoro Guided Loc Of Needle/cath Tip For Spinal Inject Lt  Result Date: 05/17/2016 CLINICAL DATA:  Diffuse large B-cell lymphoma EXAM: DIAGNOSTIC LUMBAR PUNCTURE UNDER FLUOROSCOPIC GUIDANCE FLUOROSCOPY TIME:  Fluoroscopy Time:  0 minutes 39 second Radiation Exposure Index (if provided by the fluoroscopic device): Number of Acquired Spot Images: 0 PROCEDURE: Informed consent was obtained from the patient prior to the procedure, including potential complications of headache, allergy, and pain. With the patient prone, the lower back was prepped with Betadine. 1% Lidocaine was used for local anesthesia. Lumbar puncture was performed at the L2-3 level using a 20 gauge needle with return of clear CSF with an opening pressure of 20 cm water. Intrathecal methotrexate prepared by the pharmacist was injected into the subarachnoid space. IMPRESSION: Successful intrathecal injection of methotrexate. Electronically Signed   By: Franchot Gallo M.D.   On: 05/17/2016 12:35    ASSESSMENT & PLAN:   Diffuse large B-cell lymphoma of lymph nodes of multiple regions [facility]) Clinically, the patient has excellent response to treatment with minimal side-effects He is readmitted to the hospital on 06/05/16 for cycle 6 of chemotherapy He will get second dose of  intrathecal chemotherapy on 06/07/16 as inpatient. He will return to the cancer center on 3/122018 for G-CSF support He will receive his final dose of IT chemo around 06/28/2016. I plan to repeat staging PET CT scan on 06/25/2016 and see him back on 06/27/2016. I have sent a referral to [facility] for opinion related to consolidation treatment with bone marrow transplant  Anemia due to antineoplastic chemotherapy This is likely due to recent treatment. The patient denies recent history of  bleeding such as epistaxis, hematuria or hematochezia. He is asymptomatic from the anemia. I will observe for now.  He does not require transfusion now. I will continue the chemotherapy at current dose without dosage adjustment.  If the anemia gets progressive worse in the future, I might have to delay his treatment or adjust the chemotherapy dose.  H/O: CVA (cerebrovascular accident) We discontinue Plavix from recent hospitalization Currently, he is only on 81 mg aspirin therapy We exchanged his antiplatelet agent due to anticipated IT chemotherapy I will hold Lovenox on Wednesday, 06/06/2016 in anticipation for IT methotrexate  DVT prophylaxis Lovenox  History of severe constipation Continue regular laxatives therapy  Bilateral leg edema Due to low protein status Observe only.  Avoid diuretics  CODE STATUS Full code  Discharge planning At the end of the week when chemotherapy is completed  All questions were answered. The patient knows to call the clinic with any problems, questions or concerns.    Heath Lark, MD 06/05/2016 8:50 AM

## 2016-06-05 NOTE — Progress Notes (Signed)
Individual checks completed with Drue Dun, RN to verify that Adriamycin, Etoposide, and Vincristine dosing is correct based on patient's BSA and normal dosing

## 2016-06-05 NOTE — Progress Notes (Signed)
Individual checks completed with Drue Dun, RN to verify that Rituxan dosing is correct based on patient's BSA and normal dosing.

## 2016-06-06 MED ORDER — SODIUM CHLORIDE 0.9 % IV SOLN
Freq: Once | INTRAVENOUS | Status: AC
  Administered 2016-06-06: 8 mg via INTRAVENOUS
  Filled 2016-06-06: qty 4

## 2016-06-06 MED ORDER — VINCRISTINE SULFATE CHEMO INJECTION 1 MG/ML
Freq: Once | INTRAVENOUS | Status: AC
  Administered 2016-06-06: 13:00:00 via INTRAVENOUS
  Filled 2016-06-06: qty 11

## 2016-06-06 NOTE — Progress Notes (Signed)
Samuel Berger   DOB:May 27, 1945   QQ#:[id number]    Subjective: He feels good.  Tolerated cycle 6, day 1 chemotherapy well.  Leg swelling has improved.  He denies constipation, nausea or mucositis.  Objective:  Vitals:   06/05/16 2042 06/06/16 0518  BP: 107/65 (!) 107/58  Pulse: 93 86  Resp: 17 17  Temp: 98.5 F (36.9 C) 97.6 F (36.4 C)     Intake/Output Summary (Last 24 hours) at 06/06/16 4332 Last data filed at 06/06/16 0846  Gross per 24 hour  Intake           2324.8 ml  Output             2550 ml  Net           -225.2 ml    GENERAL:alert, no distress and comfortable SKIN: skin color, texture, turgor are normal, no rashes or significant lesions EYES: normal, Conjunctiva are pink and non-injected, sclera clear OROPHARYNX:no exudate, no erythema and lips, buccal mucosa, and tongue normal  NECK: supple, thyroid normal size, non-tender, without nodularity LYMPH:  no palpable lymphadenopathy in the cervical, axillary or inguinal LUNGS: clear to auscultation and percussion with normal breathing effort HEART: regular rate & rhythm and no murmurs and no lower extremity edema ABDOMEN:abdomen soft, non-tender and normal bowel sounds Musculoskeletal:no cyanosis of digits and no clubbing  NEURO: alert & oriented x 3 with fluent speech, no focal motor/sensory deficits   Labs:  Lab Results  Component Value Date   WBC 11.5 (H) 06/04/2016   HGB 9.6 (L) 06/04/2016   HCT 29.8 (L) 06/04/2016   MCV 100.0 (H) 06/04/2016   PLT 248 06/04/2016   NEUTROABS 8.1 (H) 06/04/2016    Lab Results  Component Value Date   NA 142 06/04/2016   K 4.5 06/04/2016   CL 107 05/16/2016   CO2 29 06/04/2016   Assessment & Plan:   Diffuse large B-cell lymphoma of lymph nodes of multiple regions ([facility]) Clinically, the patient has excellent response to treatment with minimal side-effects He is readmitted to the hospital on 3/6/18for cycle 6of chemotherapy He will get second dose of intrathecal  chemotherapy on 06/07/16 as inpatient. Dr. Carlis Abbott is reminded today He will return to the cancer center on 3/122018 for G-CSF support He will receive his final dose of IT chemo around 06/28/2016. I plan to repeat staging PET CT scan on 06/25/2016 and see him back on 06/27/2016. I have sent a referral to [facility] for opinion related to consolidation treatment with bone marrow transplant  Anemia due to antineoplastic chemotherapy This is likely due to recent treatment. The patient denies recent history of bleeding such as epistaxis, hematuria or hematochezia. Heis asymptomatic from the anemia. I will observe for now. Hedoes not require transfusion now. I will continue the chemotherapy at current dose without dosage adjustment. If the anemia gets progressive worse in the future, I might have to delay histreatment or adjust the chemotherapy dose.  H/O: CVA (cerebrovascular accident) We discontinue Plavix from recent hospitalization Currently, he is only on 65 mg aspirin therapy We exchanged his antiplatelet agent due to anticipated IT chemotherapy  DVT prophylaxis I will hold Lovenox today and tomorrowin anticipation for IT methotrexate  History of severe constipation Continue regular laxatives therapy  Bilateral leg edema, minimum Due to low protein status Observe only.  Avoid diuretics. Encourage mobility  CODE STATUS Full code  Discharge planning At the end of the week when chemotherapy is completed  Heath Lark, MD 06/06/2016  9:26 AM

## 2016-06-06 NOTE — Progress Notes (Signed)
Adriamycin, Vepesid and Oncovin doses and dilutions verified with Aldean Baker, RN for bag # 2 of 4.Marland Kitchen

## 2016-06-07 ENCOUNTER — Inpatient Hospital Stay (HOSPITAL_COMMUNITY): Payer: Medicare Other

## 2016-06-07 LAB — CBC WITH DIFFERENTIAL/PLATELET
Basophils Absolute: 0 10*3/uL (ref 0.0–0.1)
Basophils Relative: 0 %
Eosinophils Absolute: 0 10*3/uL (ref 0.0–0.7)
Eosinophils Relative: 0 %
HEMATOCRIT: 25.2 % — AB (ref 39.0–52.0)
HEMOGLOBIN: 8.5 g/dL — AB (ref 13.0–17.0)
LYMPHS ABS: 1.3 10*3/uL (ref 0.7–4.0)
LYMPHS PCT: 10 %
MCH: 33.1 pg (ref 26.0–34.0)
MCHC: 33.7 g/dL (ref 30.0–36.0)
MCV: 98.1 fL (ref 78.0–100.0)
Monocytes Absolute: 1.1 10*3/uL — ABNORMAL HIGH (ref 0.1–1.0)
Monocytes Relative: 8 %
NEUTROS ABS: 11.3 10*3/uL — AB (ref 1.7–7.7)
NEUTROS PCT: 82 %
Platelets: 238 10*3/uL (ref 150–400)
RBC: 2.57 MIL/uL — AB (ref 4.22–5.81)
RDW: 18.2 % — ABNORMAL HIGH (ref 11.5–15.5)
WBC: 13.8 10*3/uL — ABNORMAL HIGH (ref 4.0–10.5)

## 2016-06-07 LAB — COMPREHENSIVE METABOLIC PANEL
ALK PHOS: 42 U/L (ref 38–126)
ALT: 12 U/L — AB (ref 17–63)
AST: 15 U/L (ref 15–41)
Albumin: 3 g/dL — ABNORMAL LOW (ref 3.5–5.0)
Anion gap: 4 — ABNORMAL LOW (ref 5–15)
BUN: 16 mg/dL (ref 6–20)
CALCIUM: 8.7 mg/dL — AB (ref 8.9–10.3)
CO2: 28 mmol/L (ref 22–32)
CREATININE: 0.61 mg/dL (ref 0.61–1.24)
Chloride: 108 mmol/L (ref 101–111)
Glucose, Bld: 139 mg/dL — ABNORMAL HIGH (ref 65–99)
Potassium: 3.8 mmol/L (ref 3.5–5.1)
Sodium: 140 mmol/L (ref 135–145)
Total Bilirubin: 0.4 mg/dL (ref 0.3–1.2)
Total Protein: 5.4 g/dL — ABNORMAL LOW (ref 6.5–8.1)

## 2016-06-07 MED ORDER — LIDOCAINE HCL 1 % IJ SOLN
INTRAMUSCULAR | Status: AC
  Administered 2016-06-07: 20 mL
  Filled 2016-06-07: qty 20

## 2016-06-07 MED ORDER — VINCRISTINE SULFATE CHEMO INJECTION 1 MG/ML
Freq: Once | INTRAVENOUS | Status: AC
  Administered 2016-06-07: 13:00:00 via INTRAVENOUS
  Filled 2016-06-07: qty 11

## 2016-06-07 MED ORDER — METHOTREXATE SODIUM CHEMO INJECTION (PF) 50 MG/2ML
Freq: Once | INTRAMUSCULAR | Status: AC
  Administered 2016-06-07: 10:00:00 via INTRATHECAL
  Filled 2016-06-07: qty 0.48

## 2016-06-07 MED ORDER — SODIUM CHLORIDE 0.9 % IV SOLN
Freq: Once | INTRAVENOUS | Status: AC
  Administered 2016-06-07: 8 mg via INTRAVENOUS
  Filled 2016-06-07: qty 4

## 2016-06-07 NOTE — Progress Notes (Signed)
Samuel Berger   DOB:[birth year]   YD#:[id number]    Subjective:  He feels well.  He denies side effects from treatment.  Has minimum leg edema.  Of severe constipation.  Denies nausea, vomiting  Objective:  Vitals:   06/06/16 2047 06/07/16 0501  BP: 119/65 136/62  Pulse: 91 83  Resp: 18 16  Temp: 98.5 F (36.9 C) 98.4 F (36.9 C)     Intake/Output Summary (Last 24 hours) at 06/07/16 6720 Last data filed at 06/07/16 0801  Gross per 24 hour  Intake           2244.3 ml  Output             2350 ml  Net           -105.7 ml    GENERAL:alert, no distress and comfortable SKIN: skin color, texture, turgor are normal, no rashes or significant lesions EYES: normal, Conjunctiva are pink and non-injected, sclera clear OROPHARYNX:no exudate, no erythema and lips, buccal mucosa, and tongue normal  NECK: supple, thyroid normal size, non-tender, without nodularity LYMPH:  no palpable lymphadenopathy in the cervical, axillary or inguinal LUNGS: clear to auscultation and percussion with normal breathing effort HEART: regular rate & rhythm and no murmurs with trace bilateral lower extremity edema ABDOMEN:abdomen soft, non-tender and normal bowel sounds Musculoskeletal:no cyanosis of digits and no clubbing  NEURO: alert & oriented x 3 with fluent speech, no focal motor/sensory deficits   Labs:  Lab Results  Component Value Date   WBC 13.8 (H) 06/07/2016   HGB 8.5 (L) 06/07/2016   HCT 25.2 (L) 06/07/2016   MCV 98.1 06/07/2016   PLT 238 06/07/2016   NEUTROABS 11.3 (H) 06/07/2016    Lab Results  Component Value Date   NA 140 06/07/2016   K 3.8 06/07/2016   CL 108 06/07/2016   CO2 28 06/07/2016    Assessment & Plan:   Diffuse large B-cell lymphoma of lymph nodes of multiple regions (HCC) Clinically, the patient has excellent response to treatment with minimal side-effects He will get second dose of intrathecal chemotherapy today He will return to the cancer center on 3/122018 for  G-CSF support He will receive his final dose of IT chemo around 06/28/2016. I plan to repeat staging PET CT scan on 06/25/2016 and see him back on 06/27/2016. I have sent a referral to [facility] for opinion related to consolidation treatment with bone marrow transplant  Anemia due to antineoplastic chemotherapy This is likely due to recent treatment. The patient denies recent history of bleeding such as epistaxis, hematuria or hematochezia. Heis asymptomatic from the anemia. I will observe for now. Hedoes not require transfusion now. I will continue the chemotherapy at current dose without dosage adjustment. If the anemia gets progressive worse in the future, I might have to delay histreatment or adjust the chemotherapy dose.  H/O: CVA (cerebrovascular accident) We discontinue Plavix from recent hospitalization Currently, he is only on 58 mg aspirin therapy We exchanged his antiplatelet agent due to anticipated IT chemotherapy  DVT prophylaxis I will hold Lovenox today and resume tomorrowin anticipation for IT methotrexate  History of severe constipation Continue regular laxatives therapy  Bilateral leg edema, minimum Due to low protein status Observe only. Avoid diuretics. Encourage mobility  CODE STATUS Full code  Discharge planning At the end of the week when chemotherapy is completed   Heath Lark, MD 06/07/2016  8:38 AM

## 2016-06-08 DIAGNOSIS — C8338 Diffuse large B-cell lymphoma, lymph nodes of multiple sites: Secondary | ICD-10-CM

## 2016-06-08 DIAGNOSIS — R600 Localized edema: Secondary | ICD-10-CM

## 2016-06-08 MED ORDER — ENOXAPARIN SODIUM 40 MG/0.4ML ~~LOC~~ SOLN
40.0000 mg | SUBCUTANEOUS | Status: DC
  Administered 2016-06-08: 40 mg via SUBCUTANEOUS
  Filled 2016-06-08: qty 0.4

## 2016-06-08 MED ORDER — VINCRISTINE SULFATE CHEMO INJECTION 1 MG/ML
Freq: Once | INTRAVENOUS | Status: AC
  Administered 2016-06-08: 13:00:00 via INTRAVENOUS
  Filled 2016-06-08: qty 11

## 2016-06-08 MED ORDER — SODIUM CHLORIDE 0.9 % IV SOLN
Freq: Once | INTRAVENOUS | Status: AC
  Administered 2016-06-08: 8 mg via INTRAVENOUS
  Filled 2016-06-08: qty 4

## 2016-06-08 NOTE — Progress Notes (Signed)
Chemo dosages and calculations checked with Kirkland Hun RN.

## 2016-06-08 NOTE — Consult Note (Signed)
   [facility] Inpatient Consult   06/08/2016  Samuel Berger February 16, 1946 [id number]   Patient screened for [facility] Management program services. Spoke with Samuel Berger and wife at bedside about [facility] Management program. Samuel Berger endorses he has close follow up with his providers. His Primary Care MD office contacts him post hospital discharges. He politely declines [facility] Care Management services at this time. Provided [facility] Care Management brochure and contact information. Made inpatient RNCM aware.  Marthenia Rolling, MSN-Ed, RN,BSN [facility] Liaison [phone]

## 2016-06-08 NOTE — Progress Notes (Signed)
Samuel Berger   DOB:12/15/1945   OE#:[id number]    Subjective: He feels well.  He tolerated intrathecal chemotherapy without problems.  The mucositis, nausea or vomiting.  No headaches leg swelling has improved.   Objective:  Vitals:   06/07/16 2116 06/08/16 0517  BP: 124/65 125/66  Pulse: 88 77  Resp: 17 17  Temp: 98.5 F (36.9 C) 97.4 F (36.3 C)     Intake/Output Summary (Last 24 hours) at 06/08/16 0815 Last data filed at 06/08/16 1829  Gross per 24 hour  Intake          1843.94 ml  Output             3125 ml  Net         -1281.06 ml    GENERAL:alert, no distress and comfortable SKIN: skin color, texture, turgor are normal, no rashes or significant lesions EYES: normal, Conjunctiva are pink and non-injected, sclera clear OROPHARYNX:no exudate, no erythema and lips, buccal mucosa, and tongue normal  NECK: supple, thyroid normal size, non-tender, without nodularity LYMPH:  no palpable lymphadenopathy in the cervical, axillary or inguinal LUNGS: clear to auscultation and percussion with normal breathing effort HEART: regular rate & rhythm and no murmurs a with minimum bilateral lower extremity edema ABDOMEN:abdomen soft, non-tender and normal bowel sounds Musculoskeletal:no cyanosis of digits and no clubbing  NEURO: alert & oriented x 3 with fluent speech, no focal motor/sensory deficits   Labs:  Lab Results  Component Value Date   WBC 13.8 (H) 06/07/2016   HGB 8.5 (L) 06/07/2016   HCT 25.2 (L) 06/07/2016   MCV 98.1 06/07/2016   PLT 238 06/07/2016   NEUTROABS 11.3 (H) 06/07/2016    Lab Results  Component Value Date   NA 140 06/07/2016   K 3.8 06/07/2016   CL 108 06/07/2016   CO2 28 06/07/2016    Studies:  Dg Fluoro Guided Needle Plc Aspiration/injection Loc  Result Date: 06/07/2016 CLINICAL DATA:  Diffuse B-cell lymphoma. For intrathecal chemotherapy EXAM: DIAGNOSTIC LUMBAR PUNCTURE UNDER FLUOROSCOPIC GUIDANCE WITH INTRATHECAL CHEMOTHERAPY FLUOROSCOPY TIME:   Fluoroscopy Time:  0 minutes 45 second Radiation Exposure Index (if provided by the fluoroscopic device): Number of Acquired Spot Images: 0 PROCEDURE: Informed consent was obtained from the patient prior to the procedure, including potential complications of headache, allergy, and pain. With the patient prone, the lower back was prepped with Betadine. 1% Lidocaine was used for local anesthesia. Lumbar puncture was performed at the L2-3 level using a 20 gauge needle with return of clear CSF . Intrathecal methotrexate repaired right pharmacy was injected in subarachnoid space without complication. Patient tolerated the procedure well without apparent complication IMPRESSION: Successful intrathecal chemotherapy injection Electronically Signed   By: Franchot Gallo M.D.   On: 06/07/2016 11:14    Assessment & Plan:   Diffuse large B-cell lymphoma of lymph nodes of multiple regions ([place]) Clinically, the patient has excellent response to treatment with minimal side-effects He will return to the cancer center on 3/122018 for G-CSF support He will receive his final dose of IT chemo around 06/28/2016. I plan to repeat staging PET CT scan on 06/25/2016 and see him back on 06/27/2016. I have sent a referral to [facility] for opinion related to consolidation treatment with bone marrow transplant  Anemia due to antineoplastic chemotherapy This is likely due to recent treatment. The patient denies recent history of bleeding such as epistaxis, hematuria or hematochezia. Heis asymptomatic from the anemia. I will observe  for now. Hedoes not require transfusion now. I will continue the chemotherapy at current dose without dosage adjustment. If the anemia gets progressive worse in the future, I might have to delay histreatment or adjust the chemotherapy dose.  H/O: CVA (cerebrovascular accident) We discontinue Plavix from recent hospitalization Currently, he is only on 34 mg aspirin  therapy We exchanged his antiplatelet agent due to anticipated IT chemotherapy  DVT prophylaxis I will resume DVT prophylaxis today  History of severe constipation Continue regular laxatives therapy  Bilateral leg edema, minimum Due to low protein status Observe only. Avoid diuretics. Encourage mobility  CODE STATUS Full code  Discharge planning At the end of the week when chemotherapy is completed    Heath Lark, MD 06/08/2016  8:15 AM

## 2016-06-09 MED ORDER — SODIUM CHLORIDE 0.9 % IV SOLN
750.0000 mg/m2 | Freq: Once | INTRAVENOUS | Status: AC
  Administered 2016-06-09: 1600 mg via INTRAVENOUS
  Filled 2016-06-09: qty 80

## 2016-06-09 MED ORDER — SODIUM CHLORIDE 0.9 % IV SOLN
Freq: Once | INTRAVENOUS | Status: AC
  Administered 2016-06-09: 16 mg via INTRAVENOUS
  Filled 2016-06-09: qty 8

## 2016-06-09 NOTE — Discharge Summary (Signed)
Physician Discharge Summary  Patient ID: Samuel Berger MRN: [id number] DOB/AGE: 72-Sep-1947 72 y.o.  Admit date: 06/05/2016 Discharge date: 06/09/2016  Primary Care Physician:  Wenda Low, MD   Discharge Diagnoses:    Present on Admission: . Diffuse large B-cell lymphoma of lymph nodes of multiple regions ([place]) . Anemia due to antineoplastic chemotherapy . Diffuse large B cell lymphoma ([place])   Discharge Medications:  Allergies as of 06/09/2016      Reactions   Bee Venom Hives   Metformin And Related    dizziness   Sulfa Drugs Cross Reactors Other (See Comments)   Unknown      Medication List    TAKE these medications   amLODipine 10 MG tablet Commonly known as:  NORVASC Take 10 mg by mouth every morning.   aspirin EC 81 MG tablet Take 81 mg by mouth every morning.   atorvastatin 40 MG tablet Commonly known as:  LIPITOR Take 40 mg by mouth every evening.   lidocaine-prilocaine cream Commonly known as:  EMLA Apply 1 application topically as needed. What changed:  when to take this  reasons to take this   pioglitazone 30 MG tablet Commonly known as:  ACTOS Take 30 mg by mouth every morning.   sennosides-docusate sodium 8.6-50 MG tablet Commonly known as:  SENOKOT-S Take 2 tablets by mouth 2 (two) times daily.       Disposition and Follow-up:   Significant Diagnostic Studies:  Dg Fluoro Guided Needle Plc Aspiration/injection Loc  Result Date: 06/07/2016 CLINICAL DATA:  Diffuse B-cell lymphoma. For intrathecal chemotherapy EXAM: DIAGNOSTIC LUMBAR PUNCTURE UNDER FLUOROSCOPIC GUIDANCE WITH INTRATHECAL CHEMOTHERAPY FLUOROSCOPY TIME:  Fluoroscopy Time:  0 minutes 45 second Radiation Exposure Index (if provided by the fluoroscopic device): Number of Acquired Spot Images: 0 PROCEDURE: Informed consent was obtained from the patient prior to the procedure, including potential complications of headache, allergy, and pain. With the patient prone, the  lower back was prepped with Betadine. 1% Lidocaine was used for local anesthesia. Lumbar puncture was performed at the L2-3 level using a 20 gauge needle with return of clear CSF . Intrathecal methotrexate repaired right pharmacy was injected in subarachnoid space without complication. Patient tolerated the procedure well without apparent complication IMPRESSION: Successful intrathecal chemotherapy injection Electronically Signed   By: Franchot Gallo M.D.   On: 06/07/2016 11:14   Dg Fluoro Guided Loc Of Needle/cath Tip For Spinal Inject Lt  Result Date: 05/17/2016 CLINICAL DATA:  Diffuse large B-cell lymphoma EXAM: DIAGNOSTIC LUMBAR PUNCTURE UNDER FLUOROSCOPIC GUIDANCE FLUOROSCOPY TIME:  Fluoroscopy Time:  0 minutes 39 second Radiation Exposure Index (if provided by the fluoroscopic device): Number of Acquired Spot Images: 0 PROCEDURE: Informed consent was obtained from the patient prior to the procedure, including potential complications of headache, allergy, and pain. With the patient prone, the lower back was prepped with Betadine. 1% Lidocaine was used for local anesthesia. Lumbar puncture was performed at the L2-3 level using a 20 gauge needle with return of clear CSF with an opening pressure of 20 cm water. Intrathecal methotrexate prepared by the pharmacist was injected into the subarachnoid space. IMPRESSION: Successful intrathecal injection of methotrexate. Electronically Signed   By: Franchot Gallo M.D.   On: 05/17/2016 12:35    Discharge Laboratory Values: Lab Results  Component Value Date   WBC 13.8 (H) 06/07/2016   HGB 8.5 (L) 06/07/2016   HCT 25.2 (L) 06/07/2016   MCV 98.1 06/07/2016   PLT 238 06/07/2016   Lab Results  Component  Value Date   NA 140 06/07/2016   K 3.8 06/07/2016   CL 108 06/07/2016   CO2 28 06/07/2016    Brief H and P: For complete details please refer to admission H and P, but in brief, he completed cycle 6 of chemotherapy without complications  Diffuse large  B-cell lymphoma of lymph nodes of multiple regions ([place]) Clinically, the patient has excellent response to treatment with minimal side-effects He will return to the cancer center on 3/122018 for G-CSF support He will receive his final dose of IT chemo around 06/28/2016. I plan to repeat staging PET CT scan on 06/25/2016 and see him back on 06/27/2016. I have sent a referral to [facility] for opinion related to consolidation treatment with bone marrow transplant  Anemia due to antineoplastic chemotherapy This is likely due to recent treatment. The patient denies recent history of bleeding such as epistaxis, hematuria or hematochezia. Heis asymptomatic from the anemia. I will observe for now. Hedoes not require transfusion now.   H/O: CVA (cerebrovascular accident) We discontinue Plavix from recent hospitalization Currently, he is only on 81 mg aspirin therapy We exchanged his antiplatelet agent due to anticipated IT chemotherapy  History of severe constipation Continue regular laxatives therapy  Bilateral leg edema, minimum Due to low protein status Observe only. Avoid diuretics. Encourage mobility   Physical Exam at Discharge: BP 122/63 (BP Location: Right Arm)   Pulse 71   Temp 97.6 F (36.4 C) (Oral)   Resp 18   Ht 6' (1.829 m)   Wt 190 lb 12.8 oz (86.5 kg)   SpO2 97%   BMI 25.88 kg/m  GENERAL:alert, no distress and comfortable SKIN: skin color, texture, turgor are normal, no rashes or significant lesions EYES: normal, Conjunctiva are pink and non-injected, sclera clear OROPHARYNX:no exudate, no erythema and lips, buccal mucosa, and tongue normal  NECK: supple, thyroid normal size, non-tender, without nodularity LYMPH:  no palpable lymphadenopathy in the cervical, axillary or inguinal LUNGS: clear to auscultation and percussion with normal breathing effort HEART: regular rate & rhythm and no murmurs and no lower extremity  edema ABDOMEN:abdomen soft, non-tender and normal bowel sounds Musculoskeletal:no cyanosis of digits and no clubbing  NEURO: alert & oriented x 3 with fluent speech, no focal motor/sensory deficits  Hospital Course:  Active Problems:   Diffuse large B-cell lymphoma of lymph nodes of multiple regions (HCC)   Anemia due to antineoplastic chemotherapy   Diffuse large B cell lymphoma (HCC)   Diet:  Regular  Activity:  As tolerated  Condition at Discharge:   stable  Signed: Dr. Heath Lark [phone]  06/09/2016, 7:20 AM

## 2016-06-09 NOTE — Progress Notes (Signed)
Patient discharged to home via wheelchair with family, discharge instructions reviewed with patient who verbalized understanding. No new RX's.

## 2016-06-11 ENCOUNTER — Other Ambulatory Visit: Payer: Self-pay | Admitting: Hematology and Oncology

## 2016-06-11 ENCOUNTER — Ambulatory Visit (HOSPITAL_BASED_OUTPATIENT_CLINIC_OR_DEPARTMENT_OTHER): Payer: Medicare Other

## 2016-06-11 VITALS — BP 120/58 | HR 86 | Temp 98.9°F | Resp 20

## 2016-06-11 DIAGNOSIS — C8338 Diffuse large B-cell lymphoma, lymph nodes of multiple sites: Secondary | ICD-10-CM

## 2016-06-11 DIAGNOSIS — D6481 Anemia due to antineoplastic chemotherapy: Secondary | ICD-10-CM

## 2016-06-11 DIAGNOSIS — Z95828 Presence of other vascular implants and grafts: Secondary | ICD-10-CM

## 2016-06-11 MED ORDER — PEGFILGRASTIM INJECTION 6 MG/0.6ML ~~LOC~~
6.0000 mg | PREFILLED_SYRINGE | Freq: Once | SUBCUTANEOUS | Status: AC
  Administered 2016-06-11: 6 mg via SUBCUTANEOUS
  Filled 2016-06-11: qty 0.6

## 2016-06-11 NOTE — Patient Instructions (Signed)
Pegfilgrastim injection What is this medicine? PEGFILGRASTIM (PEG fil gra stim) is a long-acting granulocyte colony-stimulating factor that stimulates the growth of neutrophils, a type of white blood cell important in the body's fight against infection. It is used to reduce the incidence of fever and infection in patients with certain types of cancer who are receiving chemotherapy that affects the bone marrow, and to increase survival after being exposed to high doses of radiation. This medicine may be used for other purposes; ask your health care provider or pharmacist if you have questions. COMMON BRAND NAME(S): Neulasta What should I tell my health care provider before I take this medicine? They need to know if you have any of these conditions: -kidney disease -latex allergy -ongoing radiation therapy -sickle cell disease -skin reactions to acrylic adhesives (On-Body Injector only) -an unusual or allergic reaction to pegfilgrastim, filgrastim, other medicines, foods, dyes, or preservatives -pregnant or trying to get pregnant -breast-feeding How should I use this medicine? This medicine is for injection under the skin. If you get this medicine at home, you will be taught how to prepare and give the pre-filled syringe or how to use the On-body Injector. Refer to the patient Instructions for Use for detailed instructions. Use exactly as directed. Take your medicine at regular intervals. Do not take your medicine more often than directed. It is important that you put your used needles and syringes in a special sharps container. Do not put them in a trash can. If you do not have a sharps container, call your pharmacist or healthcare provider to get one. Talk to your pediatrician regarding the use of this medicine in children. While this drug may be prescribed for selected conditions, precautions do apply. Overdosage: If you think you have taken too much of this medicine contact a poison control  center or emergency room at once. NOTE: This medicine is only for you. Do not share this medicine with others. What if I miss a dose? It is important not to miss your dose. Call your doctor or health care professional if you miss your dose. If you miss a dose due to an On-body Injector failure or leakage, a new dose should be administered as soon as possible using a single prefilled syringe for manual use. What may interact with this medicine? Interactions have not been studied. Give your health care provider a list of all the medicines, herbs, non-prescription drugs, or dietary supplements you use. Also tell them if you smoke, drink alcohol, or use illegal drugs. Some items may interact with your medicine. This list may not describe all possible interactions. Give your health care provider a list of all the medicines, herbs, non-prescription drugs, or dietary supplements you use. Also tell them if you smoke, drink alcohol, or use illegal drugs. Some items may interact with your medicine. What should I watch for while using this medicine? You may need blood work done while you are taking this medicine. If you are going to need a MRI, CT scan, or other procedure, tell your doctor that you are using this medicine (On-Body Injector only). What side effects may I notice from receiving this medicine? Side effects that you should report to your doctor or health care professional as soon as possible: -allergic reactions like skin rash, itching or hives, swelling of the face, lips, or tongue -dizziness -fever -pain, redness, or irritation at site where injected -pinpoint red spots on the skin -red or dark-brown urine -shortness of breath or breathing problems -stomach or   side pain, or pain at the shoulder -swelling -tiredness -trouble passing urine or change in the amount of urine Side effects that usually do not require medical attention (report to your doctor or health care professional if they  continue or are bothersome): -bone pain -muscle pain This list may not describe all possible side effects. Call your doctor for medical advice about side effects. You may report side effects to FDA at 1-800-FDA-1088. Where should I keep my medicine? Keep out of the reach of children. Store pre-filled syringes in a refrigerator between 2 and 8 degrees C (36 and 46 degrees F). Do not freeze. Keep in carton to protect from light. Throw away this medicine if it is left out of the refrigerator for more than 48 hours. Throw away any unused medicine after the expiration date. NOTE: This sheet is a summary. It may not cover all possible information. If you have questions about this medicine, talk to your doctor, pharmacist, or health care provider.  2017 Elsevier/Gold Standard (2014-04-08 14:30:14)  

## 2016-06-25 ENCOUNTER — Other Ambulatory Visit (HOSPITAL_BASED_OUTPATIENT_CLINIC_OR_DEPARTMENT_OTHER): Payer: Medicare Other

## 2016-06-25 ENCOUNTER — Ambulatory Visit (HOSPITAL_BASED_OUTPATIENT_CLINIC_OR_DEPARTMENT_OTHER): Payer: Medicare Other

## 2016-06-25 ENCOUNTER — Telehealth: Payer: Self-pay | Admitting: *Deleted

## 2016-06-25 DIAGNOSIS — C8338 Diffuse large B-cell lymphoma, lymph nodes of multiple sites: Secondary | ICD-10-CM | POA: Diagnosis not present

## 2016-06-25 DIAGNOSIS — C8510 Unspecified B-cell lymphoma, unspecified site: Secondary | ICD-10-CM

## 2016-06-25 DIAGNOSIS — Z95828 Presence of other vascular implants and grafts: Secondary | ICD-10-CM

## 2016-06-25 LAB — CBC WITH DIFFERENTIAL/PLATELET
BASO%: 0.8 % (ref 0.0–2.0)
Basophils Absolute: 0.1 10*3/uL (ref 0.0–0.1)
EOS%: 0.8 % (ref 0.0–7.0)
Eosinophils Absolute: 0.1 10*3/uL (ref 0.0–0.5)
HEMATOCRIT: 28.6 % — AB (ref 38.4–49.9)
HGB: 9.5 g/dL — ABNORMAL LOW (ref 13.0–17.1)
LYMPH#: 0.7 10*3/uL — AB (ref 0.9–3.3)
LYMPH%: 7.6 % — ABNORMAL LOW (ref 14.0–49.0)
MCH: 33.8 pg — ABNORMAL HIGH (ref 27.2–33.4)
MCHC: 33.4 g/dL (ref 32.0–36.0)
MCV: 101.3 fL — ABNORMAL HIGH (ref 79.3–98.0)
MONO#: 1.4 10*3/uL — ABNORMAL HIGH (ref 0.1–0.9)
MONO%: 14.7 % — ABNORMAL HIGH (ref 0.0–14.0)
NEUT#: 7.4 10*3/uL — ABNORMAL HIGH (ref 1.5–6.5)
NEUT%: 76.1 % — AB (ref 39.0–75.0)
Platelets: 247 10*3/uL (ref 140–400)
RBC: 2.82 10*6/uL — AB (ref 4.20–5.82)
RDW: 17.4 % — ABNORMAL HIGH (ref 11.0–14.6)
WBC: 9.8 10*3/uL (ref 4.0–10.3)

## 2016-06-25 LAB — COMPREHENSIVE METABOLIC PANEL
ALT: 13 U/L (ref 0–55)
AST: 13 U/L (ref 5–34)
Albumin: 3.4 g/dL — ABNORMAL LOW (ref 3.5–5.0)
Alkaline Phosphatase: 60 U/L (ref 40–150)
Anion Gap: 7 mEq/L (ref 3–11)
BUN: 12.9 mg/dL (ref 7.0–26.0)
CO2: 28 meq/L (ref 22–29)
Calcium: 9.3 mg/dL (ref 8.4–10.4)
Chloride: 106 mEq/L (ref 98–109)
Creatinine: 0.7 mg/dL (ref 0.7–1.3)
GLUCOSE: 137 mg/dL (ref 70–140)
Potassium: 4.1 mEq/L (ref 3.5–5.1)
Sodium: 141 mEq/L (ref 136–145)
TOTAL PROTEIN: 5.7 g/dL — AB (ref 6.4–8.3)

## 2016-06-25 MED ORDER — SODIUM CHLORIDE 0.9% FLUSH
10.0000 mL | INTRAVENOUS | Status: DC | PRN
  Administered 2016-06-25: 10 mL via INTRAVENOUS
  Filled 2016-06-25: qty 10

## 2016-06-25 MED ORDER — HEPARIN SOD (PORK) LOCK FLUSH 100 UNIT/ML IV SOLN
500.0000 [IU] | INTRAVENOUS | Status: DC | PRN
  Administered 2016-06-25: 500 [IU] via INTRAVENOUS
  Filled 2016-06-25: qty 5

## 2016-06-25 NOTE — Telephone Encounter (Signed)
Notified of appt with Dr Dellis Filbert at [facility]. 07/17/16 @ 0800

## 2016-06-25 NOTE — Telephone Encounter (Signed)
Called to check on referral placed on 06/04/16 to [facility]. Scheduler will check on status and call with appt

## 2016-06-27 ENCOUNTER — Ambulatory Visit (HOSPITAL_COMMUNITY): Payer: Medicare Other

## 2016-06-27 ENCOUNTER — Encounter: Payer: Self-pay | Admitting: Hematology and Oncology

## 2016-06-27 ENCOUNTER — Ambulatory Visit (HOSPITAL_BASED_OUTPATIENT_CLINIC_OR_DEPARTMENT_OTHER): Payer: Medicare Other | Admitting: Hematology and Oncology

## 2016-06-27 ENCOUNTER — Encounter (HOSPITAL_COMMUNITY)
Admission: RE | Admit: 2016-06-27 | Discharge: 2016-06-27 | Disposition: A | Discharge status: Home / Self Care | Payer: Medicare Other | Source: Ambulatory Visit | Attending: Hematology and Oncology | Admitting: Hematology and Oncology

## 2016-06-27 DIAGNOSIS — I723 Aneurysm of iliac artery: Secondary | ICD-10-CM | POA: Diagnosis not present

## 2016-06-27 DIAGNOSIS — D6481 Anemia due to antineoplastic chemotherapy: Secondary | ICD-10-CM

## 2016-06-27 DIAGNOSIS — C8338 Diffuse large B-cell lymphoma, lymph nodes of multiple sites: Secondary | ICD-10-CM

## 2016-06-27 DIAGNOSIS — T451X5A Adverse effect of antineoplastic and immunosuppressive drugs, initial encounter: Secondary | ICD-10-CM

## 2016-06-27 DIAGNOSIS — I2510 Atherosclerotic heart disease of native coronary artery without angina pectoris: Secondary | ICD-10-CM | POA: Diagnosis not present

## 2016-06-27 DIAGNOSIS — K8020 Calculus of gallbladder without cholecystitis without obstruction: Secondary | ICD-10-CM | POA: Insufficient documentation

## 2016-06-27 DIAGNOSIS — J341 Cyst and mucocele of nose and nasal sinus: Secondary | ICD-10-CM | POA: Insufficient documentation

## 2016-06-27 DIAGNOSIS — N281 Cyst of kidney, acquired: Secondary | ICD-10-CM | POA: Insufficient documentation

## 2016-06-27 DIAGNOSIS — K5641 Fecal impaction: Secondary | ICD-10-CM | POA: Diagnosis not present

## 2016-06-27 DIAGNOSIS — I700 Atherosclerosis of aorta: Secondary | ICD-10-CM | POA: Diagnosis not present

## 2016-06-27 DIAGNOSIS — Z8673 Personal history of transient ischemic attack (TIA), and cerebral infarction without residual deficits: Secondary | ICD-10-CM | POA: Diagnosis not present

## 2016-06-27 DIAGNOSIS — K4090 Unilateral inguinal hernia, without obstruction or gangrene, not specified as recurrent: Secondary | ICD-10-CM | POA: Insufficient documentation

## 2016-06-27 DIAGNOSIS — C8330 Diffuse large B-cell lymphoma, unspecified site: Secondary | ICD-10-CM | POA: Diagnosis not present

## 2016-06-27 LAB — GLUCOSE, CAPILLARY: Glucose-Capillary: 112 mg/dL — ABNORMAL HIGH (ref 65–99)

## 2016-06-27 MED ORDER — FLUDEOXYGLUCOSE F - 18 (FDG) INJECTION
9.3100 | Freq: Once | INTRAVENOUS | Status: AC | PRN
  Administered 2016-06-27: 9.31 via INTRAVENOUS

## 2016-06-27 NOTE — Assessment & Plan Note (Signed)
PET CT scan showed complete response to treatment. His last dose of intrathecal injection is scheduled for tomorrow He has appointment to go to [facility] to consider consolidation treatment with bone marrow transplant. I reviewed with him the guidelines. If he does not want to proceed with transplant, we will observe with history, physical examination and CT imaging periodically. We also discussed possibility of consolidation treatment with lenalidomide maintenance therapy.

## 2016-06-27 NOTE — Assessment & Plan Note (Addendum)
We discontinue Plavix from recent hospitalization Currently, he is only on 81 mg aspirin therapy We exchanged his antiplatelet agent due to anticipated IT chemotherapy If there are no plans for further chemotherapy, I will resume Plavix in the future

## 2016-06-27 NOTE — Assessment & Plan Note (Signed)
This is likely due to recent treatment. The patient denies recent history of bleeding such as epistaxis, hematuria or hematochezia. He is asymptomatic from the anemia. I will observe for now.  He does not require transfusion now. 

## 2016-06-27 NOTE — Progress Notes (Signed)
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 Stable ectasia or post stenotic dilatation of the right external iliac artery measuring up to 1.5 cm. Extensive atherosclerotic disease involving the abdominal aorta with a chronic intimal flap or short segment dissection in the infrarenal abdominal aorta. Chronic occlusion of the left common iliac artery. Slowly enlarging retroperitoneal lymph nodes as described. An indolent neoplastic or lymphoproliferative process cannot be excluded      01/06/2016 Imaging    Ct abdomen and pelvis showed new extensive centrally necrotic retroperitoneal and bilateral pelvic lymphadenopathy. Differential includes lymphoproliferative condition (lymphoma/leukemia) or infectious lymphadenitis (such as due to bacterial, viral or mycobacterial etiologies). Squamous cell carcinoma metastases can have a similar appearance, although this is less likely given the location. Clinical and laboratory evaluation for a lymphoproliferative condition and tissue sampling is advised. 2. Aortic atherosclerosis. Stable 3.1 cm infrarenal abdominal aortic aneurysm      01/27/2016 Imaging    CH chest showed no acute cardiopulmonary disease. Known stable retrocrural and periaortic adenopathy. Oval density over the left neck base measuring 2.5 x 4.5 cm likely due partially to prominent internal jugular vein, although cannot exclude adenopathy in this region. Recommend neck CT with contrast. Three  vessel atherosclerotic coronary artery disease. Few small thyroid nodules with the largest measuring 1.2 cm over the left lobe. 2.4 cm left renal cyst. Sub cm right renal hypodensity unchanged and too small to characterize but likely a cyst. Minimal cholelithiasis.      01/31/2016 Imaging    ECHO showed: Left ventricle: The cavity size was normal. Systolic function was normal. The estimated ejection fraction was in the range of 60% to 65%. Wall motion was normal; there were no regional wall motion abnormalities. There was an increased relative contribution of atrial contraction to ventricular filling. Doppler parameters are consistent with abnormal left ventricular relaxation (grade 1 diastolic dysfunction).      02/07/2016 Procedure    He had imaging guided biopsy of supraclavicular lymph node      02/07/2016 Pathology Results    Accession: [id number] Core biopsies reveal diffuse areas of large atypical lymphoid cells. There are multiple cells with very large irregular nuclei or multinucleated cells. Immunohistochemistry reveals the cells are positive for CD20, CD45, bcl-2, and bcl-6. They are negative for CD10, CD30, and ALK. CD3, CD4, CD5 and CD8 reveal scattered T-cells. Ki-67 reveals an elevated proliferation rate (95%). The cells are negative for cytokeratin 7, cytokeratin 20, TTF-1, CDX-2, HMB45, SOX11, and MelanA. Overall, these findings are consistent with a diffuse large B-cell lymphoma      02/12/2016 - 02/21/2016 Hospital Admission    He was admitted to the hospital with sepsis. He received cycle 1 of chemotherapy will hospitalized      02/14/2016 Imaging    Ct abdomen and pelvis showed widespread retrocrural, retroperitoneal, and bilateral pelvic lymphadenopathy, waxing/waning when compared to recent CT, as above. Mild right hydronephrosis, likely secondary to extrinsic compression by right retroperitoneal lymphadenopathy, with  associated diminished enhancement of the right kidney.  Spleen is normal in size.      02/16/2016 Bone Marrow Biopsy    Bone marrow biopsy was negative for lymphoma involvement. Cytogenetics came back abnormal: -Y in 45% of cell lines      02/16/2016 Procedure    He had port placement      02/16/2016 - 02/21/2016 Chemotherapy    He received cycle 1 of R-EPOCH       03/08/2016 - 03/12/2016 Hospital Admission    The patient received cycle 2 of chemotherapy in the hospital      03/27/2016 PET scan    Significant decrease in size of abdominal and pelvic lymphadenopathy compared to previous CT, which shows low-grade metabolic activity. No evidence of active lymphoma within the neck or chest. 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 Continued annual follow-up by CT is recommended.       04/03/2016 - 04/07/2016 Hospital Admission    The patient received cycle 3 of chemotherapy in the hospital      04/20/2016 Procedure    He underwent successful intrathecal injection of chemotherapy without complication      2/29/7989 Pathology Results    [id number] CSF fluid negative for malignant cells      04/24/2016 - 04/28/2016 Hospital Admission    The patient received cycle 4 of chemotherapy in the hospital      05/15/2016 - 05/19/2016 Hospital Admission    He received cycle 5 of chemotherapy in the hospital      05/17/2016 Procedure    Successful intrathecal injection of methotrexate.      06/05/2016 - 06/09/2016 Hospital Admission    He is admitted to the hospital for cycle 6 of chemotherapy      06/07/2016 Procedure    Successful intrathecal chemotherapy injection      06/27/2016 PET scan    Further reduction in size and metabolic activity of lower abdominal and pelvic lymph nodes indicating response to therapy. 2. 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 3. Other imaging findings of potential clinical significance: Mucous retention cyst in the left maxillary sinus. Right mastoid effusion. Left renal cyst. Coronary,  aortic arch, and branch vessel atherosclerotic vascular disease. Aortoiliac atherosclerotic vascular disease. Prominent stool throughout the colon favors constipation. Indirect left inguinal hernia containing adipose tissue. Cholelithiasis.       INTERVAL HISTORY: Please see below for problem oriented charting. He returns today to review test results. He feels well. Denies recent nausea, vomiting or constipation. No peripheral neuropathy. Denies recent infection.  REVIEW OF SYSTEMS:   Constitutional: Denies fevers, chills or abnormal weight loss Eyes: Denies blurriness of vision Ears, nose, mouth, throat, and face: Denies mucositis or sore throat Respiratory: Denies cough, dyspnea or wheezes Cardiovascular: Denies palpitation, chest discomfort or lower extremity swelling Gastrointestinal:  Denies nausea, heartburn or change in bowel habits Skin: Denies abnormal skin rashes Lymphatics: Denies new lymphadenopathy or easy bruising Neurological:Denies numbness, tingling or new weaknesses Behavioral/Psych: Mood is stable, no new changes  All other systems were reviewed with the patient and are negative.  I have reviewed the past medical history, past surgical history, social history and family history with the patient and they are unchanged from previous note.  ALLERGIES:  is allergic to bee venom; metformin and related; and sulfa drugs cross reactors.  MEDICATIONS:  Current Outpatient Prescriptions  Medication Sig Dispense Refill  . amLODipine (NORVASC) 10 MG tablet Take 10 mg by mouth every morning.     Marland Kitchen  aspirin EC 81 MG tablet Take 81 mg by mouth every morning.     Marland Kitchen atorvastatin (LIPITOR) 40 MG tablet Take 40 mg by mouth every evening.     . lidocaine-prilocaine (EMLA) cream Apply 1 application topically as needed. (Patient taking differently: Apply 1 application topically daily as needed (port access). ) 30 g 6  . pioglitazone (ACTOS) 30 MG tablet Take 30 mg by mouth every  morning.     . sennosides-docusate sodium (SENOKOT-S) 8.6-50 MG tablet Take 2 tablets by mouth 2 (two) times daily.      No current facility-administered medications for this visit.     PHYSICAL EXAMINATION: ECOG PERFORMANCE STATUS: 0 - Asymptomatic  Vitals:   06/27/16 1002  BP: (!) 118/49  Pulse: 97  Temp: 98.2 F (36.8 C)   Filed Weights   06/27/16 1002  Weight: 200 lb (90.7 kg)    GENERAL:alert, no distress and comfortable SKIN: skin color, texture, turgor are normal, no rashes or significant lesions EYES: normal, Conjunctiva are pink and non-injected, sclera clear Musculoskeletal:no cyanosis of digits and no clubbing  NEURO: alert & oriented x 3 with fluent speech, no focal motor/sensory deficits  LABORATORY DATA:  I have reviewed the data as listed    Component Value Date/Time   NA 141 06/25/2016 1057   K 4.1 06/25/2016 1057   CL 108 06/07/2016 0630   CO2 28 06/25/2016 1057   GLUCOSE 137 06/25/2016 1057   BUN 12.9 06/25/2016 1057   CREATININE 0.7 06/25/2016 1057   CALCIUM 9.3 06/25/2016 1057   PROT 5.7 (L) 06/25/2016 1057   ALBUMIN 3.4 (L) 06/25/2016 1057   AST 13 06/25/2016 1057   ALT 13 06/25/2016 1057   ALKPHOS 60 06/25/2016 1057   BILITOT <0.22 06/25/2016 1057   GFRNONAA >60 06/07/2016 0630   GFRAA >60 06/07/2016 0630    No results found for: SPEP, UPEP  Lab Results  Component Value Date   WBC 9.8 06/25/2016   NEUTROABS 7.4 (H) 06/25/2016   HGB 9.5 (L) 06/25/2016   HCT 28.6 (L) 06/25/2016   MCV 101.3 (H) 06/25/2016   PLT 247 06/25/2016      Chemistry      Component Value Date/Time   NA 141 06/25/2016 1057   K 4.1 06/25/2016 1057   CL 108 06/07/2016 0630   CO2 28 06/25/2016 1057   BUN 12.9 06/25/2016 1057   CREATININE 0.7 06/25/2016 1057      Component Value Date/Time   CALCIUM 9.3 06/25/2016 1057   ALKPHOS 60 06/25/2016 1057   AST 13 06/25/2016 1057   ALT 13 06/25/2016 1057   BILITOT <0.22 06/25/2016 1057       RADIOGRAPHIC  STUDIES: I have personally reviewed the radiological images as listed and agreed with the findings in the report. Nm Pet Image Restag (ps) Skull Base To Thigh  Result Date: 06/27/2016 CLINICAL DATA:  Subsequent treatment strategy for diffuse large B-cell lymphoma. EXAM: NUCLEAR MEDICINE PET SKULL BASE TO THIGH TECHNIQUE: 9.3 mCi F-18 FDG was injected intravenously. Full-ring PET imaging was performed from the skull base to thigh after the radiotracer. CT data was obtained and used for attenuation correction and anatomic localization. FASTING BLOOD GLUCOSE:  Value: 112 mg/dl COMPARISON:  64/62/8805 FINDINGS: NECK No hypermetabolic lymph nodes in the neck. Mucous retention cyst in the left maxillary sinus. Right mastoid effusion inferiorly. 1.2 cm hypodense left inferior thyroid nodule, without accentuated hypermetabolic activity. CHEST No hypermetabolic mediastinal or hilar nodes. No suspicious pulmonary nodules on  the CT data. Coronary, aortic arch, and branch vessel atherosclerotic vascular disease. Mild biapical pleuroparenchymal scarring. ABDOMEN/PELVIS No abnormal hypermetabolic activity within the liver, pancreas, adrenal glands, or spleen. Significant further reduction in hypermetabolic retroperitoneal and pelvic adenopathy. An index periaortic lymph node on image 151 series 4 measures 6 mm in short axis (previously 11 mm) and has a maximum standard uptake value of 1.7 (formerly 3.0). A right common iliac node has a short axis diameter of 1.1 cm (formerly 2.4 cm) and a maximum standard uptake value of 3.2 (formerly 4.0). Other lymph nodes are likewise smaller and with reduced activity. Stable appearance of right common iliac artery aneurysm, 2.8 cm diameter on image 164/4, formerly the same by my measurement. Left kidney upper pole exophytic cyst. Aortoiliac atherosclerotic vascular disease. Prominent stool throughout the colon favors constipation. Indirect left inguinal hernia contains adipose tissue.  Cholelithiasis. SKELETON No significant osseous hypermetabolic activity. Incidental right proximal humeral enchondroma. IMPRESSION: 1. Further reduction in size and metabolic activity of lower abdominal and pelvic lymph nodes indicating response to therapy. 2. 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 3. Other imaging findings of potential clinical significance: Mucous retention cyst in the left maxillary sinus. Right mastoid effusion. Left renal cyst. Coronary, aortic arch, and branch vessel atherosclerotic vascular disease. Aortoiliac atherosclerotic vascular disease. Prominent stool throughout the colon favors constipation. Indirect left inguinal hernia containing adipose tissue. Cholelithiasis. Electronically Signed   By: Van Clines M.D.   On: 06/27/2016 09:23   Dg Fluoro Guided Needle Plc Aspiration/injection Loc  Result Date: 06/07/2016 CLINICAL DATA:  Diffuse B-cell lymphoma. For intrathecal chemotherapy EXAM: DIAGNOSTIC LUMBAR PUNCTURE UNDER FLUOROSCOPIC GUIDANCE WITH INTRATHECAL CHEMOTHERAPY FLUOROSCOPY TIME:  Fluoroscopy Time:  0 minutes 45 second Radiation Exposure Index (if provided by the fluoroscopic device): Number of Acquired Spot Images: 0 PROCEDURE: Informed consent was obtained from the patient prior to the procedure, including potential complications of headache, allergy, and pain. With the patient prone, the lower back was prepped with Betadine. 1% Lidocaine was used for local anesthesia. Lumbar puncture was performed at the L2-3 level using a 20 gauge needle with return of clear CSF . Intrathecal methotrexate repaired right pharmacy was injected in subarachnoid space without complication. Patient tolerated the procedure well without apparent complication IMPRESSION: Successful intrathecal chemotherapy injection Electronically Signed   By: Franchot Gallo M.D.   On: 06/07/2016 11:14    ASSESSMENT & PLAN:  Diffuse large B-cell lymphoma of lymph nodes of multiple  regions [facility]) PET CT scan showed complete response to treatment. His last dose of intrathecal injection is scheduled for tomorrow He has appointment to go to [facility] to consider consolidation treatment with bone marrow transplant. I reviewed with him the guidelines. If he does not want to proceed with transplant, we will observe with history, physical examination and CT imaging periodically. We also discussed possibility of consolidation treatment with lenalidomide maintenance therapy.  Anemia due to antineoplastic chemotherapy This is likely due to recent treatment. The patient denies recent history of bleeding such as epistaxis, hematuria or hematochezia. He is asymptomatic from the anemia. I will observe for now.  He does not require transfusion now.  H/O: CVA (cerebrovascular accident) We discontinue Plavix from recent hospitalization Currently, he is only on 81 mg aspirin therapy We exchanged his antiplatelet agent due to anticipated IT chemotherapy If there are no plans for further chemotherapy, I will resume Plavix in the future   No orders of the defined types were placed  in this encounter.  All questions were answered. The patient knows to call the clinic with any problems, questions or concerns. No barriers to learning was detected. I spent 20 minutes counseling the patient face to face. The total time spent in the appointment was 25 minutes and more than 50% was on counseling and review of test results     Heath Lark, MD 06/27/2016 2:21 PM

## 2016-06-28 ENCOUNTER — Encounter (HOSPITAL_COMMUNITY): Payer: Self-pay

## 2016-06-28 ENCOUNTER — Ambulatory Visit (HOSPITAL_COMMUNITY)
Admission: RE | Admit: 2016-06-28 | Discharge: 2016-06-28 | Disposition: A | Discharge status: Home / Self Care | Payer: Medicare Other | Source: Ambulatory Visit | Attending: Hematology and Oncology | Admitting: Hematology and Oncology

## 2016-06-28 DIAGNOSIS — Z5111 Encounter for antineoplastic chemotherapy: Secondary | ICD-10-CM | POA: Insufficient documentation

## 2016-06-28 DIAGNOSIS — Z7982 Long term (current) use of aspirin: Secondary | ICD-10-CM | POA: Insufficient documentation

## 2016-06-28 DIAGNOSIS — C8338 Diffuse large B-cell lymphoma, lymph nodes of multiple sites: Secondary | ICD-10-CM | POA: Insufficient documentation

## 2016-06-28 DIAGNOSIS — Z79899 Other long term (current) drug therapy: Secondary | ICD-10-CM | POA: Diagnosis not present

## 2016-06-28 DIAGNOSIS — C8510 Unspecified B-cell lymphoma, unspecified site: Secondary | ICD-10-CM | POA: Diagnosis not present

## 2016-06-28 LAB — GLUCOSE, CAPILLARY: GLUCOSE-CAPILLARY: 139 mg/dL — AB (ref 65–99)

## 2016-06-28 MED ORDER — SODIUM CHLORIDE 0.9 % IJ SOLN
Freq: Once | INTRAMUSCULAR | Status: AC
  Administered 2016-06-28: 11:00:00 via INTRATHECAL
  Filled 2016-06-28: qty 0.48

## 2016-06-28 MED ORDER — LIDOCAINE HCL 1 % IJ SOLN
INTRAMUSCULAR | Status: AC
  Filled 2016-06-28: qty 20

## 2016-06-28 MED ORDER — ACETAMINOPHEN 500 MG PO TABS: 1000.0000 mg | ORAL_TABLET | Freq: Four times a day (QID) | ORAL | Status: DC | PRN

## 2016-06-28 NOTE — Discharge Instructions (Signed)
Remain flat as much as possible for the rest of the day  Contact Dr. Alvy Bimler with any questions or concerns   Check your puncture site every day for signs of infection. Watch for:  Redness, swelling, or pain.  Fluid, blood, or pus.  Contact a doctor if:  You have a fever.  You develop a headache.  You have redness, swelling, or pain at the biopsy site, and it lasts longer than a few days.  You have fluid, blood, or pus coming from the biopsy site.  You feel sick to your stomach (nauseous).  You throw up (vomit). Get help right away if:  You are short of breath.  You have trouble breathing.  Your chest hurts.  You feel dizzy or you pass out (faint).  You have bleeding that does not stop with pressure or a bandage.  You cough up blood.  Your belly (abdomen) hurts. This information is not intended to replace advice given to you by your health care provider. Make sure you discuss any questions you have with your health care provider. Document Released: 03/01/2008 Document Revised: 08/25/2015 Document Reviewed: 03/15/2014 Elsevier Interactive Patient Education  2017 [employer].

## 2016-07-02 ENCOUNTER — Telehealth: Payer: Self-pay

## 2016-07-02 NOTE — Telephone Encounter (Signed)
Patient called and left message that he would like to have a appointment at [facility] instead of [facility] for [facility] transplant.

## 2016-07-02 NOTE — Telephone Encounter (Signed)
Called patient back, states that he will keep his appointment with [facility] that is scheduled on 4-17, and states that he will call back if he wants a second opinion at [facility].

## 2016-07-10 DIAGNOSIS — D509 Iron deficiency anemia, unspecified: Secondary | ICD-10-CM | POA: Diagnosis not present

## 2016-07-17 DIAGNOSIS — I10 Essential (primary) hypertension: Secondary | ICD-10-CM | POA: Diagnosis not present

## 2016-07-17 DIAGNOSIS — E118 Type 2 diabetes mellitus with unspecified complications: Secondary | ICD-10-CM | POA: Diagnosis not present

## 2016-07-17 DIAGNOSIS — Z8673 Personal history of transient ischemic attack (TIA), and cerebral infarction without residual deficits: Secondary | ICD-10-CM | POA: Diagnosis not present

## 2016-07-17 DIAGNOSIS — Z7682 Awaiting organ transplant status: Secondary | ICD-10-CM | POA: Diagnosis not present

## 2016-07-17 DIAGNOSIS — Z87891 Personal history of nicotine dependence: Secondary | ICD-10-CM | POA: Diagnosis not present

## 2016-07-17 DIAGNOSIS — Z9889 Other specified postprocedural states: Secondary | ICD-10-CM | POA: Diagnosis not present

## 2016-07-17 DIAGNOSIS — C8330 Diffuse large B-cell lymphoma, unspecified site: Secondary | ICD-10-CM | POA: Diagnosis not present

## 2016-07-17 DIAGNOSIS — C8338 Diffuse large B-cell lymphoma, lymph nodes of multiple sites: Secondary | ICD-10-CM | POA: Insufficient documentation

## 2016-07-17 DIAGNOSIS — Z79899 Other long term (current) drug therapy: Secondary | ICD-10-CM | POA: Diagnosis not present

## 2016-07-17 DIAGNOSIS — Z882 Allergy status to sulfonamides status: Secondary | ICD-10-CM | POA: Diagnosis not present

## 2016-07-18 ENCOUNTER — Telehealth: Payer: Self-pay | Admitting: Hematology and Oncology

## 2016-07-18 NOTE — Telephone Encounter (Signed)
Appointments scheduled per scheduling message. Patient notified.

## 2016-07-19 DIAGNOSIS — C8331 Diffuse large B-cell lymphoma, lymph nodes of head, face, and neck: Secondary | ICD-10-CM | POA: Diagnosis not present

## 2016-07-19 DIAGNOSIS — Z85828 Personal history of other malignant neoplasm of skin: Secondary | ICD-10-CM | POA: Diagnosis not present

## 2016-07-19 DIAGNOSIS — L821 Other seborrheic keratosis: Secondary | ICD-10-CM | POA: Diagnosis not present

## 2016-07-19 DIAGNOSIS — L603 Nail dystrophy: Secondary | ICD-10-CM | POA: Diagnosis not present

## 2016-07-25 DIAGNOSIS — H26492 Other secondary cataract, left eye: Secondary | ICD-10-CM | POA: Diagnosis not present

## 2016-07-25 DIAGNOSIS — H5203 Hypermetropia, bilateral: Secondary | ICD-10-CM | POA: Diagnosis not present

## 2016-07-25 DIAGNOSIS — H26491 Other secondary cataract, right eye: Secondary | ICD-10-CM | POA: Diagnosis not present

## 2016-07-25 DIAGNOSIS — H52223 Regular astigmatism, bilateral: Secondary | ICD-10-CM | POA: Diagnosis not present

## 2016-08-06 ENCOUNTER — Other Ambulatory Visit: Payer: Self-pay | Admitting: Hematology and Oncology

## 2016-08-06 ENCOUNTER — Ambulatory Visit (HOSPITAL_BASED_OUTPATIENT_CLINIC_OR_DEPARTMENT_OTHER): Payer: Medicare Other

## 2016-08-06 ENCOUNTER — Telehealth: Payer: Self-pay | Admitting: Hematology and Oncology

## 2016-08-06 ENCOUNTER — Encounter: Payer: Self-pay | Admitting: Hematology and Oncology

## 2016-08-06 ENCOUNTER — Telehealth: Payer: Self-pay | Admitting: Oncology

## 2016-08-06 ENCOUNTER — Ambulatory Visit (HOSPITAL_BASED_OUTPATIENT_CLINIC_OR_DEPARTMENT_OTHER): Payer: Medicare Other | Admitting: Hematology and Oncology

## 2016-08-06 ENCOUNTER — Other Ambulatory Visit (HOSPITAL_BASED_OUTPATIENT_CLINIC_OR_DEPARTMENT_OTHER): Payer: Medicare Other

## 2016-08-06 VITALS — BP 118/57 | HR 86 | Temp 98.4°F | Resp 18 | Ht 72.0 in | Wt 212.3 lb

## 2016-08-06 DIAGNOSIS — C8338 Diffuse large B-cell lymphoma, lymph nodes of multiple sites: Secondary | ICD-10-CM

## 2016-08-06 DIAGNOSIS — I10 Essential (primary) hypertension: Secondary | ICD-10-CM | POA: Diagnosis not present

## 2016-08-06 DIAGNOSIS — Z8673 Personal history of transient ischemic attack (TIA), and cerebral infarction without residual deficits: Secondary | ICD-10-CM | POA: Diagnosis not present

## 2016-08-06 DIAGNOSIS — Z95828 Presence of other vascular implants and grafts: Secondary | ICD-10-CM

## 2016-08-06 DIAGNOSIS — D6481 Anemia due to antineoplastic chemotherapy: Secondary | ICD-10-CM | POA: Diagnosis not present

## 2016-08-06 DIAGNOSIS — R600 Localized edema: Secondary | ICD-10-CM | POA: Insufficient documentation

## 2016-08-06 DIAGNOSIS — C8510 Unspecified B-cell lymphoma, unspecified site: Secondary | ICD-10-CM

## 2016-08-06 DIAGNOSIS — T451X5A Adverse effect of antineoplastic and immunosuppressive drugs, initial encounter: Secondary | ICD-10-CM

## 2016-08-06 LAB — CBC WITH DIFFERENTIAL/PLATELET
BASO%: 1.3 % (ref 0.0–2.0)
Basophils Absolute: 0.1 10*3/uL (ref 0.0–0.1)
EOS%: 10.1 % — ABNORMAL HIGH (ref 0.0–7.0)
Eosinophils Absolute: 0.5 10*3/uL (ref 0.0–0.5)
HCT: 34.1 % — ABNORMAL LOW (ref 38.4–49.9)
HGB: 11.4 g/dL — ABNORMAL LOW (ref 13.0–17.1)
LYMPH%: 19.5 % (ref 14.0–49.0)
MCH: 33.1 pg (ref 27.2–33.4)
MCHC: 33.4 g/dL (ref 32.0–36.0)
MCV: 99.2 fL — AB (ref 79.3–98.0)
MONO#: 1.1 10*3/uL — ABNORMAL HIGH (ref 0.1–0.9)
MONO%: 21.4 % — AB (ref 0.0–14.0)
NEUT%: 47.7 % (ref 39.0–75.0)
NEUTROS ABS: 2.5 10*3/uL (ref 1.5–6.5)
Platelets: 159 10*3/uL (ref 140–400)
RBC: 3.44 10*6/uL — ABNORMAL LOW (ref 4.20–5.82)
RDW: 14.5 % (ref 11.0–14.6)
WBC: 5.3 10*3/uL (ref 4.0–10.3)
lymph#: 1 10*3/uL (ref 0.9–3.3)

## 2016-08-06 LAB — COMPREHENSIVE METABOLIC PANEL
ALT: 15 U/L (ref 0–55)
ANION GAP: 9 meq/L (ref 3–11)
AST: 17 U/L (ref 5–34)
Albumin: 3.7 g/dL (ref 3.5–5.0)
Alkaline Phosphatase: 72 U/L (ref 40–150)
BILIRUBIN TOTAL: 0.33 mg/dL (ref 0.20–1.20)
BUN: 17 mg/dL (ref 7.0–26.0)
CHLORIDE: 107 meq/L (ref 98–109)
CO2: 29 meq/L (ref 22–29)
Calcium: 9.3 mg/dL (ref 8.4–10.4)
Creatinine: 0.8 mg/dL (ref 0.7–1.3)
GLUCOSE: 116 mg/dL (ref 70–140)
Potassium: 4.2 mEq/L (ref 3.5–5.1)
SODIUM: 144 meq/L (ref 136–145)
TOTAL PROTEIN: 5.9 g/dL — AB (ref 6.4–8.3)

## 2016-08-06 MED ORDER — HEPARIN SOD (PORK) LOCK FLUSH 100 UNIT/ML IV SOLN
500.0000 [IU] | INTRAVENOUS | Status: DC | PRN
  Administered 2016-08-06: 500 [IU] via INTRAVENOUS
  Filled 2016-08-06: qty 5

## 2016-08-06 MED ORDER — HYDROCHLOROTHIAZIDE 25 MG PO TABS: 25.0000 mg | ORAL_TABLET | Freq: Every day | ORAL | 3 refills | Status: DC

## 2016-08-06 MED ORDER — SODIUM CHLORIDE 0.9% FLUSH
10.0000 mL | INTRAVENOUS | Status: DC | PRN
  Administered 2016-08-06: 10 mL via INTRAVENOUS
  Filled 2016-08-06: qty 10

## 2016-08-06 NOTE — Telephone Encounter (Signed)
Appointments scheduled per 5/2 LOS. Patient notified.

## 2016-08-06 NOTE — Assessment & Plan Note (Signed)
Overall, anemia is improving dramatically.  Continue close observation

## 2016-08-06 NOTE — Assessment & Plan Note (Signed)
He has moderate bilateral leg edema secondary to fluid retention I recommend diuretic therapy and avoidance of excessive salt ingestion Clinically, he does not have signs or symptoms of congestive heart failure

## 2016-08-06 NOTE — Telephone Encounter (Signed)
Appointments scheduled per 5.7.18 LOS. Patient given AVS report and calendars with future scheduled appointments. °

## 2016-08-06 NOTE — Assessment & Plan Note (Signed)
He had recent leg swelling I recommend stopping amlodipine and he will go back on lisinopril I will start him on diuretic therapy as well

## 2016-08-06 NOTE — Assessment & Plan Note (Signed)
He has resumed Plavix.  I recommend discontinuation of aspirin

## 2016-08-06 NOTE — Assessment & Plan Note (Signed)
PET CT scan showed near complete response to treatment. I reviewed with him the guidelines. We will observe with history, physical examination and imaging periodically. We also discussed possibility of consolidation treatment with lenalidomide maintenance therapy and he declined Due to persistent abnormality on recent PET scan, I plan to repeat PET CT scan before I see him back next month. Overall, he is recovering well from recent side effects of treatment

## 2016-08-06 NOTE — Progress Notes (Signed)
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 Stable ectasia or post stenotic dilatation of the right external iliac artery measuring up to 1.5 cm. Extensive atherosclerotic disease involving the abdominal aorta with a chronic intimal flap or short segment dissection in the infrarenal abdominal aorta. Chronic occlusion of the left common iliac artery. Slowly enlarging retroperitoneal lymph nodes as described. An indolent neoplastic or lymphoproliferative process cannot be excluded      01/06/2016 Imaging    Ct abdomen and pelvis showed new extensive centrally necrotic retroperitoneal and bilateral pelvic lymphadenopathy. Differential includes lymphoproliferative condition (lymphoma/leukemia) or infectious lymphadenitis (such as due to bacterial, viral or mycobacterial etiologies). Squamous cell carcinoma metastases can have a similar appearance, although this is less likely given the location. Clinical and laboratory evaluation for a lymphoproliferative condition and tissue sampling is advised. 2. Aortic atherosclerosis. Stable 3.1 cm infrarenal abdominal aortic aneurysm      01/27/2016 Imaging    CH chest showed no acute cardiopulmonary disease. Known stable retrocrural and periaortic adenopathy. Oval density over the left neck base measuring 2.5 x 4.5 cm likely due partially to prominent internal jugular vein, although cannot exclude adenopathy in this region. Recommend neck CT with contrast. Three  vessel atherosclerotic coronary artery disease. Few small thyroid nodules with the largest measuring 1.2 cm over the left lobe. 2.4 cm left renal cyst. Sub cm right renal hypodensity unchanged and too small to characterize but likely a cyst. Minimal cholelithiasis.      01/31/2016 Imaging    ECHO showed: Left ventricle: The cavity size was normal. Systolic function was normal. The estimated ejection fraction was in the range of 60% to 65%. Wall motion was normal; there were no regional wall motion abnormalities. There was an increased relative contribution of atrial contraction to ventricular filling. Doppler parameters are consistent with abnormal left ventricular relaxation (grade 1 diastolic dysfunction).      02/07/2016 Procedure    He had imaging guided biopsy of supraclavicular lymph node      02/07/2016 Pathology Results    Accession: [id number] Core biopsies reveal diffuse areas of large atypical lymphoid cells. There are multiple cells with very large irregular nuclei or multinucleated cells. Immunohistochemistry reveals the cells are positive for CD20, CD45, bcl-2, and bcl-6. They are negative for CD10, CD30, and ALK. CD3, CD4, CD5 and CD8 reveal scattered T-cells. Ki-67 reveals an elevated proliferation rate (95%). The cells are negative for cytokeratin 7, cytokeratin 20, TTF-1, CDX-2, HMB45, SOX11, and MelanA. Overall, these findings are consistent with a diffuse large B-cell lymphoma      02/12/2016 - 02/21/2016 Hospital Admission    He was admitted to the hospital with sepsis. He received cycle 1 of chemotherapy will hospitalized      02/14/2016 Imaging    Ct abdomen and pelvis showed widespread retrocrural, retroperitoneal, and bilateral pelvic lymphadenopathy, waxing/waning when compared to recent CT, as above. Mild right hydronephrosis, likely secondary to extrinsic compression by right retroperitoneal lymphadenopathy, with  associated diminished enhancement of the right kidney.  Spleen is normal in size.      02/16/2016 Bone Marrow Biopsy    Bone marrow biopsy was negative for lymphoma involvement. Cytogenetics came back abnormal: -Y in 45% of cell lines      02/16/2016 Procedure    He had port placement      02/16/2016 - 02/21/2016 Chemotherapy    He received cycle 1 of R-EPOCH       03/08/2016 - 03/12/2016 Hospital Admission    The patient received cycle 2 of chemotherapy in the hospital      03/27/2016 PET scan    Significant decrease in size of abdominal and pelvic lymphadenopathy compared to previous CT, which shows low-grade metabolic activity. No evidence of active lymphoma within the neck or chest. 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 Continued annual follow-up by CT is recommended.       04/03/2016 - 04/07/2016 Hospital Admission    The patient received cycle 3 of chemotherapy in the hospital      04/20/2016 Procedure    He underwent successful intrathecal injection of chemotherapy without complication      8/85/0277 Pathology Results    [id number] CSF fluid negative for malignant cells      04/24/2016 - 04/28/2016 Hospital Admission    The patient received cycle 4 of chemotherapy in the hospital      05/15/2016 - 05/19/2016 Hospital Admission    He received cycle 5 of chemotherapy in the hospital      05/17/2016 Procedure    Successful intrathecal injection of methotrexate.      06/05/2016 - 06/09/2016 Hospital Admission    He is admitted to the hospital for cycle 6 of chemotherapy      06/07/2016 Procedure    Successful intrathecal chemotherapy injection      06/27/2016 PET scan    Further reduction in size and metabolic activity of lower abdominal and pelvic lymph nodes indicating response to therapy. 2. 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 3. Other imaging findings of potential clinical significance: Mucous retention cyst in the left maxillary sinus. Right mastoid effusion. Left renal cyst. Coronary,  aortic arch, and branch vessel atherosclerotic vascular disease. Aortoiliac atherosclerotic vascular disease. Prominent stool throughout the colon favors constipation. Indirect left inguinal hernia containing adipose tissue. Cholelithiasis.      06/28/2016 Procedure    Intrathecal injection of chemotherapy without complication       INTERVAL HISTORY: Please see below for problem oriented charting. He returns for further follow-up He is recovering nicely from recent treatment Denies significant neuropathy He complained of fatigue No constipation His only main complaint is bilateral leg edema and recent weight gain I have reviewed recent recommendations from [facility] He is very active with all activities of daily living He denies recent infection  REVIEW OF SYSTEMS:   Constitutional: Denies fevers, chills or abnormal weight loss Eyes: Denies blurriness of vision Ears, nose, mouth, throat, and face: Denies mucositis or sore throat Respiratory: Denies cough, dyspnea or wheezes Cardiovascular: Denies palpitation, chest discomfort  Gastrointestinal:  Denies nausea, heartburn or change in bowel habits Skin: Denies abnormal skin rashes Lymphatics: Denies new lymphadenopathy or easy bruising Neurological:Denies numbness, tingling or new weaknesses Behavioral/Psych: Mood is stable, no new changes  All other systems were reviewed with the patient and are negative.  I have reviewed the past medical history, past surgical history, social history and family history with the patient and  they are unchanged from previous note.  ALLERGIES:  is allergic to bee venom; metformin and related; and sulfa drugs cross reactors.  MEDICATIONS:  Current Outpatient Prescriptions  Medication Sig Dispense Refill  . amLODipine (NORVASC) 10 MG tablet Take 10 mg by mouth every morning.     Marland Kitchen aspirin EC 81 MG tablet Take 81 mg by mouth every morning.     Marland Kitchen atorvastatin (LIPITOR) 40  MG tablet Take 40 mg by mouth every evening.     . clopidogrel (PLAVIX) 75 MG tablet Take 75 mg by mouth daily.    Marland Kitchen lidocaine-prilocaine (EMLA) cream Apply 1 application topically as needed. (Patient taking differently: Apply 1 application topically daily as needed (port access). ) 30 g 6  . pioglitazone (ACTOS) 30 MG tablet Take 30 mg by mouth every morning.     . hydrochlorothiazide (HYDRODIURIL) 25 MG tablet Take 1 tablet (25 mg total) by mouth daily. 60 tablet 3   No current facility-administered medications for this visit.     PHYSICAL EXAMINATION: ECOG PERFORMANCE STATUS: 1 - Symptomatic but completely ambulatory  Vitals:   08/06/16 1433  BP: (!) 118/57  Pulse: 86  Resp: 18  Temp: 98.4 F (36.9 C)   Filed Weights   08/06/16 1433  Weight: 212 lb 4.8 oz (96.3 kg)    GENERAL:alert, no distress and comfortable SKIN: skin color, texture, turgor are normal, no rashes or significant lesions EYES: normal, Conjunctiva are pink and non-injected, sclera clear OROPHARYNX:no exudate, no erythema and lips, buccal mucosa, and tongue normal  NECK: supple, thyroid normal size, non-tender, without nodularity LYMPH:  no palpable lymphadenopathy in the cervical, axillary or inguinal LUNGS: clear to auscultation and percussion with normal breathing effort HEART: regular rate & rhythm and no murmurs with moderate bilateral lower extremity edema ABDOMEN:abdomen soft, non-tender and normal bowel sounds Musculoskeletal:no cyanosis of digits and no clubbing  NEURO: alert & oriented x 3 with fluent speech, no focal motor/sensory deficits  LABORATORY DATA:  I have reviewed the data as listed    Component Value Date/Time   NA 144 08/06/2016 1348   K 4.2 08/06/2016 1348   CL 108 06/07/2016 0630   CO2 29 08/06/2016 1348   GLUCOSE 116 08/06/2016 1348   BUN 17.0 08/06/2016 1348   CREATININE 0.8 08/06/2016 1348   CALCIUM 9.3 08/06/2016 1348   PROT 5.9 (L) 08/06/2016 1348   ALBUMIN 3.7  08/06/2016 1348   AST 17 08/06/2016 1348   ALT 15 08/06/2016 1348   ALKPHOS 72 08/06/2016 1348   BILITOT 0.33 08/06/2016 1348   GFRNONAA >60 06/07/2016 0630   GFRAA >60 06/07/2016 0630    No results found for: SPEP, UPEP  Lab Results  Component Value Date   WBC 5.3 08/06/2016   NEUTROABS 2.5 08/06/2016   HGB 11.4 (L) 08/06/2016   HCT 34.1 (L) 08/06/2016   MCV 99.2 (H) 08/06/2016   PLT 159 08/06/2016      Chemistry      Component Value Date/Time   NA 144 08/06/2016 1348   K 4.2 08/06/2016 1348   CL 108 06/07/2016 0630   CO2 29 08/06/2016 1348   BUN 17.0 08/06/2016 1348   CREATININE 0.8 08/06/2016 1348      Component Value Date/Time   CALCIUM 9.3 08/06/2016 1348   ALKPHOS 72 08/06/2016 1348   AST 17 08/06/2016 1348   ALT 15 08/06/2016 1348   BILITOT 0.33 08/06/2016 1348     ASSESSMENT & PLAN:  Diffuse large B-cell  lymphoma of lymph nodes of multiple regions [facility]) PET CT scan showed near complete response to treatment. I reviewed with him the guidelines. We will observe with history, physical examination and imaging periodically. We also discussed possibility of consolidation treatment with lenalidomide maintenance therapy and he declined Due to persistent abnormality on recent PET scan, I plan to repeat PET CT scan before I see him back next month. Overall, he is recovering well from recent side effects of treatment  Essential hypertension He had recent leg swelling I recommend stopping amlodipine and he will go back on lisinopril I will start him on diuretic therapy as well  Bilateral leg edema He has moderate bilateral leg edema secondary to fluid retention I recommend diuretic therapy and avoidance of excessive salt ingestion Clinically, he does not have signs or symptoms of congestive heart failure  Anemia due to antineoplastic chemotherapy Overall, anemia is improving dramatically.  Continue close observation  H/O: CVA (cerebrovascular accident) He has  resumed Plavix.  I recommend discontinuation of aspirin   Orders Placed This Encounter  Procedures  . NM PET Image Restag (PS) Skull Base To Thigh    Standing Status:   Future    Standing Expiration Date:   09/10/2017    Order Specific Question:   Reason for exam:    Answer:   staging lymphoma    Order Specific Question:   Preferred imaging location?    Answer:   [facility]   All questions were answered. The patient knows to call the clinic with any problems, questions or concerns. No barriers to learning was detected. I spent 20 minutes counseling the patient face to face. The total time spent in the appointment was 25 minutes and more than 50% was on counseling and review of test results     Samuel Lark, MD 08/06/2016 3:35 PM

## 2016-09-26 ENCOUNTER — Ambulatory Visit (HOSPITAL_COMMUNITY): Payer: Medicare Other

## 2016-09-26 ENCOUNTER — Encounter (HOSPITAL_COMMUNITY)
Admission: RE | Admit: 2016-09-26 | Discharge: 2016-09-26 | Disposition: A | Discharge status: Home / Self Care | Payer: Medicare Other | Source: Ambulatory Visit | Attending: Hematology and Oncology | Admitting: Hematology and Oncology

## 2016-09-26 ENCOUNTER — Other Ambulatory Visit (HOSPITAL_BASED_OUTPATIENT_CLINIC_OR_DEPARTMENT_OTHER): Payer: Medicare Other

## 2016-09-26 ENCOUNTER — Ambulatory Visit (HOSPITAL_BASED_OUTPATIENT_CLINIC_OR_DEPARTMENT_OTHER): Payer: Medicare Other

## 2016-09-26 DIAGNOSIS — C8510 Unspecified B-cell lymphoma, unspecified site: Secondary | ICD-10-CM

## 2016-09-26 DIAGNOSIS — C8338 Diffuse large B-cell lymphoma, lymph nodes of multiple sites: Secondary | ICD-10-CM | POA: Diagnosis not present

## 2016-09-26 DIAGNOSIS — C8330 Diffuse large B-cell lymphoma, unspecified site: Secondary | ICD-10-CM | POA: Diagnosis not present

## 2016-09-26 DIAGNOSIS — Z95828 Presence of other vascular implants and grafts: Secondary | ICD-10-CM

## 2016-09-26 LAB — COMPREHENSIVE METABOLIC PANEL
ALBUMIN: 3.9 g/dL (ref 3.5–5.0)
ALK PHOS: 74 U/L (ref 40–150)
ALT: 19 U/L (ref 0–55)
ANION GAP: 10 meq/L (ref 3–11)
AST: 22 U/L (ref 5–34)
BILIRUBIN TOTAL: 0.36 mg/dL (ref 0.20–1.20)
BUN: 17.9 mg/dL (ref 7.0–26.0)
CALCIUM: 9.7 mg/dL (ref 8.4–10.4)
CO2: 30 mEq/L — ABNORMAL HIGH (ref 22–29)
Chloride: 103 mEq/L (ref 98–109)
Creatinine: 0.8 mg/dL (ref 0.7–1.3)
EGFR: 89 mL/min/{1.73_m2} — AB (ref >90)
Glucose: 122 mg/dl (ref 70–140)
Potassium: 3.9 mEq/L (ref 3.5–5.1)
Sodium: 142 mEq/L (ref 136–145)
TOTAL PROTEIN: 6.4 g/dL (ref 6.4–8.3)

## 2016-09-26 LAB — CBC WITH DIFFERENTIAL/PLATELET
BASO%: 0.7 % (ref 0.0–2.0)
Basophils Absolute: 0 10*3/uL (ref 0.0–0.1)
EOS ABS: 0.5 10*3/uL (ref 0.0–0.5)
EOS%: 8.8 % — ABNORMAL HIGH (ref 0.0–7.0)
HEMATOCRIT: 41.1 % (ref 38.4–49.9)
HEMOGLOBIN: 13.7 g/dL (ref 13.0–17.1)
LYMPH%: 24.4 % (ref 14.0–49.0)
MCH: 31.6 pg (ref 27.2–33.4)
MCHC: 33.3 g/dL (ref 32.0–36.0)
MCV: 94.9 fL (ref 79.3–98.0)
MONO#: 1.3 10*3/uL — AB (ref 0.1–0.9)
MONO%: 22.1 % — ABNORMAL HIGH (ref 0.0–14.0)
NEUT%: 44 % (ref 39.0–75.0)
NEUTROS ABS: 2.5 10*3/uL (ref 1.5–6.5)
PLATELETS: 147 10*3/uL (ref 140–400)
RBC: 4.33 10*6/uL (ref 4.20–5.82)
RDW: 13.5 % (ref 11.0–14.6)
WBC: 5.7 10*3/uL (ref 4.0–10.3)
lymph#: 1.4 10*3/uL (ref 0.9–3.3)

## 2016-09-26 LAB — GLUCOSE, CAPILLARY: Glucose-Capillary: 120 mg/dL — ABNORMAL HIGH (ref 65–99)

## 2016-09-26 MED ORDER — FLUDEOXYGLUCOSE F - 18 (FDG) INJECTION
10.4300 | Freq: Once | INTRAVENOUS | Status: AC | PRN
  Administered 2016-09-26: 10.43 via INTRAVENOUS

## 2016-09-26 MED ORDER — SODIUM CHLORIDE 0.9% FLUSH
10.0000 mL | Freq: Once | INTRAVENOUS | Status: AC
  Administered 2016-09-26: 10 mL
  Filled 2016-09-26: qty 10

## 2016-09-26 MED ORDER — HEPARIN SOD (PORK) LOCK FLUSH 100 UNIT/ML IV SOLN
500.0000 [IU] | Freq: Once | INTRAVENOUS | Status: AC
  Administered 2016-09-26: 500 [IU]
  Filled 2016-09-26: qty 5

## 2016-09-26 NOTE — Patient Instructions (Signed)

## 2016-09-27 ENCOUNTER — Ambulatory Visit (HOSPITAL_BASED_OUTPATIENT_CLINIC_OR_DEPARTMENT_OTHER): Payer: Medicare Other | Admitting: Hematology and Oncology

## 2016-09-27 ENCOUNTER — Telehealth: Payer: Self-pay | Admitting: Hematology and Oncology

## 2016-09-27 VITALS — BP 130/52 | HR 88 | Temp 98.4°F | Resp 18 | Ht 70.0 in | Wt 212.7 lb

## 2016-09-27 DIAGNOSIS — R600 Localized edema: Secondary | ICD-10-CM

## 2016-09-27 DIAGNOSIS — C8338 Diffuse large B-cell lymphoma, lymph nodes of multiple sites: Secondary | ICD-10-CM | POA: Diagnosis not present

## 2016-09-27 NOTE — Telephone Encounter (Signed)
Scheduled appt per 6/28 los - Gave patient AVS and calender per los.  

## 2016-09-28 ENCOUNTER — Encounter: Payer: Self-pay | Admitting: Hematology and Oncology

## 2016-09-28 NOTE — Progress Notes (Signed)
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 Stable ectasia or post stenotic dilatation of the right external iliac artery measuring up to 1.5 cm. Extensive atherosclerotic disease involving the abdominal aorta with a chronic intimal flap or short segment dissection in the infrarenal abdominal aorta. Chronic occlusion of the left common iliac artery. Slowly enlarging retroperitoneal lymph nodes as described. An indolent neoplastic or lymphoproliferative process cannot be excluded      01/06/2016 Imaging    Ct abdomen and pelvis showed new extensive centrally necrotic retroperitoneal and bilateral pelvic lymphadenopathy. Differential includes lymphoproliferative condition (lymphoma/leukemia) or infectious lymphadenitis (such as due to bacterial, viral or mycobacterial etiologies). Squamous cell carcinoma metastases can have a similar appearance, although this is less likely given the location. Clinical and laboratory evaluation for a lymphoproliferative condition and tissue sampling is advised. 2. Aortic atherosclerosis. Stable 3.1 cm infrarenal abdominal aortic aneurysm      01/27/2016 Imaging    CH chest showed no acute cardiopulmonary disease. Known stable retrocrural and periaortic adenopathy. Oval density over the left neck base measuring 2.5 x 4.5 cm likely due partially to prominent internal jugular vein, although cannot exclude adenopathy in this region. Recommend neck CT with contrast. Three  vessel atherosclerotic coronary artery disease. Few small thyroid nodules with the largest measuring 1.2 cm over the left lobe. 2.4 cm left renal cyst. Sub cm right renal hypodensity unchanged and too small to characterize but likely a cyst. Minimal cholelithiasis.      01/31/2016 Imaging    ECHO showed: Left ventricle: The cavity size was normal. Systolic function was normal. The estimated ejection fraction was in the range of 60% to 65%. Wall motion was normal; there were no regional wall motion abnormalities. There was an increased relative contribution of atrial contraction to ventricular filling. Doppler parameters are consistent with abnormal left ventricular relaxation (grade 1 diastolic dysfunction).      02/07/2016 Procedure    He had imaging guided biopsy of supraclavicular lymph node      02/07/2016 Pathology Results    Accession: [id number] Core biopsies reveal diffuse areas of large atypical lymphoid cells. There are multiple cells with very large irregular nuclei or multinucleated cells. Immunohistochemistry reveals the cells are positive for CD20, CD45, bcl-2, and bcl-6. They are negative for CD10, CD30, and ALK. CD3, CD4, CD5 and CD8 reveal scattered T-cells. Ki-67 reveals an elevated proliferation rate (95%). The cells are negative for cytokeratin 7, cytokeratin 20, TTF-1, CDX-2, HMB45, SOX11, and MelanA. Overall, these findings are consistent with a diffuse large B-cell lymphoma      02/12/2016 - 02/21/2016 Hospital Admission    He was admitted to the hospital with sepsis. He received cycle 1 of chemotherapy will hospitalized      02/14/2016 Imaging    Ct abdomen and pelvis showed widespread retrocrural, retroperitoneal, and bilateral pelvic lymphadenopathy, waxing/waning when compared to recent CT, as above. Mild right hydronephrosis, likely secondary to extrinsic compression by right retroperitoneal lymphadenopathy, with  associated diminished enhancement of the right kidney.  Spleen is normal in size.      02/16/2016 Bone Marrow Biopsy    Bone marrow biopsy was negative for lymphoma involvement. Cytogenetics came back abnormal: -Y in 45% of cell lines      02/16/2016 Procedure    He had port placement      02/16/2016 - 02/21/2016 Chemotherapy    He received cycle 1 of R-EPOCH       03/08/2016 - 03/12/2016 Hospital Admission    The patient received cycle 2 of chemotherapy in the hospital      03/27/2016 PET scan    Significant decrease in size of abdominal and pelvic lymphadenopathy compared to previous CT, which shows low-grade metabolic activity. No evidence of active lymphoma within the neck or chest. 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 Continued annual follow-up by CT is recommended.       04/03/2016 - 04/07/2016 Hospital Admission    The patient received cycle 3 of chemotherapy in the hospital      04/20/2016 Procedure    He underwent successful intrathecal injection of chemotherapy without complication      2/95/6213 Pathology Results    [id number] CSF fluid negative for malignant cells      04/24/2016 - 04/28/2016 Hospital Admission    The patient received cycle 4 of chemotherapy in the hospital      05/15/2016 - 05/19/2016 Hospital Admission    He received cycle 5 of chemotherapy in the hospital      05/17/2016 Procedure    Successful intrathecal injection of methotrexate.      06/05/2016 - 06/09/2016 Hospital Admission    He is admitted to the hospital for cycle 6 of chemotherapy      06/07/2016 Procedure    Successful intrathecal chemotherapy injection      06/27/2016 PET scan    Further reduction in size and metabolic activity of lower abdominal and pelvic lymph nodes indicating response to therapy. 2. 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 3. Other imaging findings of potential clinical significance: Mucous retention cyst in the left maxillary sinus. Right mastoid effusion. Left renal cyst. Coronary,  aortic arch, and branch vessel atherosclerotic vascular disease. Aortoiliac atherosclerotic vascular disease. Prominent stool throughout the colon favors constipation. Indirect left inguinal hernia containing adipose tissue. Cholelithiasis.      06/28/2016 Procedure    Intrathecal injection of chemotherapy without complication      6/96/2952 PET scan    1. Small LEFT external iliac lymph node is increased metabolic activity compared to prior (similar to liver activity ( [place] 3). No additional hypermetabolic adenopathy in the chest, abdomen, or pelvis. Recommend attention on follow-up. 2. Normal spleen and marrow.       INTERVAL HISTORY: Please see below for problem oriented charting. He returns for further follow-up He feels well Denies recent infection No new lymphadenopathy He had persistent mild bilateral leg edema He is active.  REVIEW OF SYSTEMS:   Constitutional: Denies fevers, chills or abnormal weight loss Eyes: Denies blurriness of vision Ears, nose, mouth, throat, and face: Denies mucositis or sore throat Respiratory: Denies cough, dyspnea or wheezes Cardiovascular: Denies palpitation, chest discomfort Gastrointestinal:  Denies nausea, heartburn or change in bowel habits Skin: Denies abnormal skin rashes Lymphatics: Denies new lymphadenopathy or easy bruising Neurological:Denies numbness, tingling or new weaknesses Behavioral/Psych: Mood is stable, no new changes  All other systems were reviewed with the patient and are negative.  I have reviewed  the past medical history, past surgical history, social history and family history with the patient and they are unchanged from previous note.  ALLERGIES:  is allergic to bee venom; metformin and related; and sulfa drugs cross reactors.  MEDICATIONS:  Current Outpatient Prescriptions  Medication Sig Dispense Refill  . amLODipine (NORVASC) 10 MG tablet Take 10 mg by mouth every morning.     Marland Kitchen aspirin EC 81 MG tablet  Take 81 mg by mouth every morning.     Marland Kitchen atorvastatin (LIPITOR) 40 MG tablet Take 40 mg by mouth every evening.     . clopidogrel (PLAVIX) 75 MG tablet Take 75 mg by mouth daily.    . hydrochlorothiazide (HYDRODIURIL) 25 MG tablet Take 1 tablet (25 mg total) by mouth daily. 60 tablet 3  . lidocaine-prilocaine (EMLA) cream Apply 1 application topically as needed. (Patient taking differently: Apply 1 application topically daily as needed (port access). ) 30 g 6  . pioglitazone (ACTOS) 30 MG tablet Take 30 mg by mouth every morning.      No current facility-administered medications for this visit.     PHYSICAL EXAMINATION: ECOG PERFORMANCE STATUS: 0 - Asymptomatic  Vitals:   09/27/16 1324  BP: (!) 130/52  Pulse: 88  Resp: 18  Temp: 98.4 F (36.9 C)   Filed Weights   09/27/16 1324  Weight: 212 lb 11.2 oz (96.5 kg)    GENERAL:alert, no distress and comfortable SKIN: skin color, texture, turgor are normal, no rashes or significant lesions EYES: normal, Conjunctiva are pink and non-injected, sclera clear OROPHARYNX:no exudate, no erythema and lips, buccal mucosa, and tongue normal  NECK: supple, thyroid normal size, non-tender, without nodularity LYMPH:  no palpable lymphadenopathy in the cervical, axillary or inguinal LUNGS: clear to auscultation and percussion with normal breathing effort HEART: regular rate & rhythm and no murmurs with mild lower extremity edema ABDOMEN:abdomen soft, non-tender and normal bowel sounds Musculoskeletal:no cyanosis of digits and no clubbing  NEURO: alert & oriented x 3 with fluent speech, no focal motor/sensory deficits  LABORATORY DATA:  I have reviewed the data as listed    Component Value Date/Time   NA 142 09/26/2016 1329   K 3.9 09/26/2016 1329   CL 108 06/07/2016 0630   CO2 30 (H) 09/26/2016 1329   GLUCOSE 122 09/26/2016 1329   BUN 17.9 09/26/2016 1329   CREATININE 0.8 09/26/2016 1329   CALCIUM 9.7 09/26/2016 1329   PROT 6.4  09/26/2016 1329   ALBUMIN 3.9 09/26/2016 1329   AST 22 09/26/2016 1329   ALT 19 09/26/2016 1329   ALKPHOS 74 09/26/2016 1329   BILITOT 0.36 09/26/2016 1329   GFRNONAA >60 06/07/2016 0630   GFRAA >60 06/07/2016 0630    No results found for: SPEP, UPEP  Lab Results  Component Value Date   WBC 5.7 09/26/2016   NEUTROABS 2.5 09/26/2016   HGB 13.7 09/26/2016   HCT 41.1 09/26/2016   MCV 94.9 09/26/2016   PLT 147 09/26/2016      Chemistry      Component Value Date/Time   NA 142 09/26/2016 1329   K 3.9 09/26/2016 1329   CL 108 06/07/2016 0630   CO2 30 (H) 09/26/2016 1329   BUN 17.9 09/26/2016 1329   CREATININE 0.8 09/26/2016 1329      Component Value Date/Time   CALCIUM 9.7 09/26/2016 1329   ALKPHOS 74 09/26/2016 1329   AST 22 09/26/2016 1329   ALT 19 09/26/2016 1329   BILITOT 0.36 09/26/2016 1329  RADIOGRAPHIC STUDIES: I have personally reviewed the radiological images as listed and agreed with the findings in the report. Nm Pet Image Restag (ps) Skull Base To Thigh  Result Date: 09/27/2016 CLINICAL DATA:  Subsequent treatment strategy for diffuse large large B cell non-Hodgkin's lymphoma. EXAM: NUCLEAR MEDICINE PET SKULL BASE TO THIGH TECHNIQUE: 10.4 mCi F-18 FDG was injected intravenously. Full-ring PET imaging was performed from the skull base to thigh after the radiotracer. CT data was obtained and used for attenuation correction and anatomic localization. FASTING BLOOD GLUCOSE:  Value: 120 mg/dl COMPARISON:  PET-CT 06/27/2016 FINDINGS: NECK No hypermetabolic lymph nodes in the neck. CHEST No hypermetabolic mediastinal or hilar nodes. No suspicious pulmonary nodules on the CT scan. Mild asymmetric gynecomastia and with mild metabolic (greater on the RIGHT). ABDOMEN/PELVIS No abnormal hypermetabolic activity within the liver, pancreas, adrenal glands, or spleen. Switch no hypermetabolic retroperitoneal periaortic lymph nodes. Single hypermetabolic LEFT external iliac  lymph node measures 7 mm short axis with SUV max equal 4.0 (image 23 of the fused data set). The metabolic activity activity is increased from comparison exam (SUV max equaled 2.6) while size unchanged. No abnormal activity in the spleen.  Spleen is normal volume. SKELETON No focal hypermetabolic activity to suggest skeletal metastasis. IMPRESSION: 1. Small LEFT external iliac lymph node is increased metabolic activity compared to prior (similar to liver activity ( [place] 3). No additional hypermetabolic adenopathy in the chest, abdomen, or pelvis. Recommend attention on follow-up. 2. Normal spleen and marrow. . Electronically Signed   By: Suzy Bouchard M.D.   On: 09/27/2016 08:24    ASSESSMENT & PLAN:  Diffuse large B-cell lymphoma of lymph nodes of multiple regions [facility]) PET CT scan showed near complete response to treatment. There is a persistent left inguinal lymphadenopathy which measures only 7 mm in size For now, recommend observation.  He is completely asymptomatic. We will observe with history, physical examination and imaging in 3 months Overall, he is recovering well from recent side effects of treatment  Bilateral leg edema He has moderate bilateral leg edema secondary to fluid retention I recommend increase mobility and avoidance of excessive salt ingestion Clinically, he does not have signs or symptoms of congestive heart failure  Morbid obesity due to excess calories ([facility]) The patient is obese with cardiovascular risk factor We discussed weight loss strategies including dietary restriction and increase exercise activity The patient appears motivated to lose weight and he felt that the target weight of 199 pounds is possible before his next appointment   Orders Placed This Encounter  Procedures  . NM PET Image Restag (PS) Skull Base To Thigh    Standing Status:   Future    Standing Expiration Date:   11/27/2017    Order Specific Question:   Reason for Exam (SYMPTOM  OR  DIAGNOSIS REQUIRED)    Answer:   staging lymphoma    Order Specific Question:   Preferred imaging location?    Answer:   [facility]   All questions were answered. The patient knows to call the clinic with any problems, questions or concerns. No barriers to learning was detected. I spent 15 minutes counseling the patient face to face. The total time spent in the appointment was 20 minutes and more than 50% was on counseling and review of test results     Heath Lark, MD 09/28/2016 7:13 AM

## 2016-09-28 NOTE — Assessment & Plan Note (Signed)
The patient is obese with cardiovascular risk factor We discussed weight loss strategies including dietary restriction and increase exercise activity The patient appears motivated to lose weight and he felt that the target weight of 199 pounds is possible before his next appointment

## 2016-09-28 NOTE — Assessment & Plan Note (Signed)
PET CT scan showed near complete response to treatment. There is a persistent left inguinal lymphadenopathy which measures only 7 mm in size For now, recommend observation.  He is completely asymptomatic. We will observe with history, physical examination and imaging in 3 months Overall, he is recovering well from recent side effects of treatment

## 2016-09-28 NOTE — Assessment & Plan Note (Signed)
He has moderate bilateral leg edema secondary to fluid retention I recommend increase mobility and avoidance of excessive salt ingestion Clinically, he does not have signs or symptoms of congestive heart failure

## 2016-11-08 ENCOUNTER — Ambulatory Visit (HOSPITAL_BASED_OUTPATIENT_CLINIC_OR_DEPARTMENT_OTHER): Payer: Medicare Other

## 2016-11-08 DIAGNOSIS — C8338 Diffuse large B-cell lymphoma, lymph nodes of multiple sites: Secondary | ICD-10-CM | POA: Diagnosis not present

## 2016-11-08 DIAGNOSIS — Z452 Encounter for adjustment and management of vascular access device: Secondary | ICD-10-CM

## 2016-11-08 DIAGNOSIS — Z95828 Presence of other vascular implants and grafts: Secondary | ICD-10-CM

## 2016-11-08 MED ORDER — HEPARIN SOD (PORK) LOCK FLUSH 100 UNIT/ML IV SOLN
500.0000 [IU] | Freq: Once | INTRAVENOUS | Status: AC
  Administered 2016-11-08: 500 [IU]
  Filled 2016-11-08: qty 5

## 2016-11-08 MED ORDER — SODIUM CHLORIDE 0.9% FLUSH
10.0000 mL | Freq: Once | INTRAVENOUS | Status: AC
  Administered 2016-11-08: 10 mL
  Filled 2016-11-08: qty 10

## 2016-12-14 ENCOUNTER — Telehealth: Payer: Self-pay | Admitting: Hematology and Oncology

## 2016-12-14 NOTE — Telephone Encounter (Signed)
sw pt to confirm lab/flush appt 9/18 per sch msg

## 2016-12-18 ENCOUNTER — Other Ambulatory Visit (HOSPITAL_BASED_OUTPATIENT_CLINIC_OR_DEPARTMENT_OTHER): Payer: Medicare Other

## 2016-12-18 ENCOUNTER — Ambulatory Visit (HOSPITAL_BASED_OUTPATIENT_CLINIC_OR_DEPARTMENT_OTHER): Payer: Medicare Other

## 2016-12-18 DIAGNOSIS — C8510 Unspecified B-cell lymphoma, unspecified site: Secondary | ICD-10-CM

## 2016-12-18 DIAGNOSIS — C8338 Diffuse large B-cell lymphoma, lymph nodes of multiple sites: Secondary | ICD-10-CM

## 2016-12-18 DIAGNOSIS — Z95828 Presence of other vascular implants and grafts: Secondary | ICD-10-CM

## 2016-12-18 LAB — CBC WITH DIFFERENTIAL/PLATELET
BASO%: 1.2 % (ref 0.0–2.0)
Basophils Absolute: 0.1 10*3/uL (ref 0.0–0.1)
EOS ABS: 1.2 10*3/uL — AB (ref 0.0–0.5)
EOS%: 16.7 % — AB (ref 0.0–7.0)
HCT: 39.7 % (ref 38.4–49.9)
HGB: 13.4 g/dL (ref 13.0–17.1)
LYMPH%: 19.3 % (ref 14.0–49.0)
MCH: 31.2 pg (ref 27.2–33.4)
MCHC: 33.9 g/dL (ref 32.0–36.0)
MCV: 92.1 fL (ref 79.3–98.0)
MONO#: 1.3 10*3/uL — ABNORMAL HIGH (ref 0.1–0.9)
MONO%: 19 % — AB (ref 0.0–14.0)
NEUT%: 43.8 % (ref 39.0–75.0)
NEUTROS ABS: 3.1 10*3/uL (ref 1.5–6.5)
Platelets: 168 10*3/uL (ref 140–400)
RBC: 4.31 10*6/uL (ref 4.20–5.82)
RDW: 15.6 % — ABNORMAL HIGH (ref 11.0–14.6)
WBC: 7.1 10*3/uL (ref 4.0–10.3)
lymph#: 1.4 10*3/uL (ref 0.9–3.3)

## 2016-12-18 LAB — COMPREHENSIVE METABOLIC PANEL
ALT: 16 U/L (ref 0–55)
AST: 16 U/L (ref 5–34)
Albumin: 3.9 g/dL (ref 3.5–5.0)
Alkaline Phosphatase: 66 U/L (ref 40–150)
Anion Gap: 9 mEq/L (ref 3–11)
BILIRUBIN TOTAL: 0.34 mg/dL (ref 0.20–1.20)
BUN: 19.3 mg/dL (ref 7.0–26.0)
CO2: 28 meq/L (ref 22–29)
Calcium: 9.6 mg/dL (ref 8.4–10.4)
Chloride: 104 mEq/L (ref 98–109)
Creatinine: 1 mg/dL (ref 0.7–1.3)
EGFR: 79 mL/min/{1.73_m2} — AB (ref >90)
GLUCOSE: 125 mg/dL (ref 70–140)
POTASSIUM: 4.4 meq/L (ref 3.5–5.1)
SODIUM: 141 meq/L (ref 136–145)
TOTAL PROTEIN: 6.4 g/dL (ref 6.4–8.3)

## 2016-12-18 MED ORDER — HEPARIN SOD (PORK) LOCK FLUSH 100 UNIT/ML IV SOLN
500.0000 [IU] | Freq: Once | INTRAVENOUS | Status: AC
  Administered 2016-12-18: 500 [IU]
  Filled 2016-12-18: qty 5

## 2016-12-18 MED ORDER — SODIUM CHLORIDE 0.9% FLUSH
10.0000 mL | Freq: Once | INTRAVENOUS | Status: AC
  Administered 2016-12-18: 10 mL
  Filled 2016-12-18: qty 10

## 2016-12-19 ENCOUNTER — Ambulatory Visit (HOSPITAL_COMMUNITY)
Admission: RE | Admit: 2016-12-19 | Discharge: 2016-12-19 | Disposition: A | Discharge status: Home / Self Care | Payer: Medicare Other | Source: Ambulatory Visit | Attending: Hematology and Oncology | Admitting: Hematology and Oncology

## 2016-12-19 DIAGNOSIS — I723 Aneurysm of iliac artery: Secondary | ICD-10-CM | POA: Insufficient documentation

## 2016-12-19 DIAGNOSIS — K8020 Calculus of gallbladder without cholecystitis without obstruction: Secondary | ICD-10-CM | POA: Insufficient documentation

## 2016-12-19 DIAGNOSIS — I700 Atherosclerosis of aorta: Secondary | ICD-10-CM | POA: Insufficient documentation

## 2016-12-19 DIAGNOSIS — I2510 Atherosclerotic heart disease of native coronary artery without angina pectoris: Secondary | ICD-10-CM | POA: Insufficient documentation

## 2016-12-19 DIAGNOSIS — C8338 Diffuse large B-cell lymphoma, lymph nodes of multiple sites: Secondary | ICD-10-CM | POA: Insufficient documentation

## 2016-12-19 DIAGNOSIS — C8510 Unspecified B-cell lymphoma, unspecified site: Secondary | ICD-10-CM | POA: Diagnosis not present

## 2016-12-19 LAB — GLUCOSE, CAPILLARY: Glucose-Capillary: 125 mg/dL — ABNORMAL HIGH (ref 65–99)

## 2016-12-19 MED ORDER — FLUDEOXYGLUCOSE F - 18 (FDG) INJECTION
10.8000 | Freq: Once | INTRAVENOUS | Status: AC | PRN
  Administered 2016-12-19: 10.8 via INTRAVENOUS

## 2016-12-21 ENCOUNTER — Telehealth: Payer: Self-pay | Admitting: *Deleted

## 2016-12-21 MED ORDER — HYDROXYZINE HCL 25 MG PO TABS: 25.0000 mg | ORAL_TABLET | Freq: Four times a day (QID) | ORAL | 0 refills | Status: DC | PRN

## 2016-12-21 NOTE — Telephone Encounter (Signed)
Pt called to state onset of itching on Wednesday 9/19 after having PET scan.  Itching is body wide and with out a rash.  Pt has used benadryl with minimal benefit.   He denies any other possible exposure for symptoms.  Per review with Dr Alen Blew ( covering MD ) - order given for atarax.  Prescription sent to verified pharmacy and pt made aware.

## 2016-12-26 ENCOUNTER — Other Ambulatory Visit: Payer: Medicare Other

## 2016-12-27 ENCOUNTER — Telehealth: Payer: Self-pay | Admitting: Hematology and Oncology

## 2016-12-27 ENCOUNTER — Ambulatory Visit (HOSPITAL_BASED_OUTPATIENT_CLINIC_OR_DEPARTMENT_OTHER): Payer: Medicare Other | Admitting: Hematology and Oncology

## 2016-12-27 DIAGNOSIS — R600 Localized edema: Secondary | ICD-10-CM | POA: Diagnosis not present

## 2016-12-27 DIAGNOSIS — C8338 Diffuse large B-cell lymphoma, lymph nodes of multiple sites: Secondary | ICD-10-CM

## 2016-12-27 NOTE — Telephone Encounter (Signed)
Gave avs and calendar for November and January 2019 °

## 2016-12-28 ENCOUNTER — Encounter: Payer: Self-pay | Admitting: Hematology and Oncology

## 2016-12-28 NOTE — Progress Notes (Signed)
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 Stable ectasia or post stenotic dilatation of the right external iliac artery measuring up to 1.5 cm. Extensive atherosclerotic disease involving the abdominal aorta with a chronic intimal flap or short segment dissection in the infrarenal abdominal aorta. Chronic occlusion of the left common iliac artery. Slowly enlarging retroperitoneal lymph nodes as described. An indolent neoplastic or lymphoproliferative process cannot be excluded      01/06/2016 Imaging    Ct abdomen and pelvis showed new extensive centrally necrotic retroperitoneal and bilateral pelvic lymphadenopathy. Differential includes lymphoproliferative condition (lymphoma/leukemia) or infectious lymphadenitis (such as due to bacterial, viral or mycobacterial etiologies). Squamous cell carcinoma metastases can have a similar appearance, although this is less likely given the location. Clinical and laboratory evaluation for a lymphoproliferative condition and tissue sampling is advised. 2. Aortic atherosclerosis. Stable 3.1 cm infrarenal abdominal aortic aneurysm      01/27/2016 Imaging    CH chest showed no acute cardiopulmonary disease. Known stable retrocrural and periaortic adenopathy. Oval density over the left neck base measuring 2.5 x 4.5 cm likely due partially to prominent internal jugular vein, although cannot exclude adenopathy in this region. Recommend neck CT with contrast. Three  vessel atherosclerotic coronary artery disease. Few small thyroid nodules with the largest measuring 1.2 cm over the left lobe. 2.4 cm left renal cyst. Sub cm right renal hypodensity unchanged and too small to characterize but likely a cyst. Minimal cholelithiasis.      01/31/2016 Imaging    ECHO showed: Left ventricle: The cavity size was normal. Systolic function was normal. The estimated ejection fraction was in the range of 60% to 65%. Wall motion was normal; there were no regional wall motion abnormalities. There was an increased relative contribution of atrial contraction to ventricular filling. Doppler parameters are consistent with abnormal left ventricular relaxation (grade 1 diastolic dysfunction).      02/07/2016 Procedure    He had imaging guided biopsy of supraclavicular lymph node      02/07/2016 Pathology Results    Accession: [id number] Core biopsies reveal diffuse areas of large atypical lymphoid cells. There are multiple cells with very large irregular nuclei or multinucleated cells. Immunohistochemistry reveals the cells are positive for CD20, CD45, bcl-2, and bcl-6. They are negative for CD10, CD30, and ALK. CD3, CD4, CD5 and CD8 reveal scattered T-cells. Ki-67 reveals an elevated proliferation rate (95%). The cells are negative for cytokeratin 7, cytokeratin 20, TTF-1, CDX-2, HMB45, SOX11, and MelanA. Overall, these findings are consistent with a diffuse large B-cell lymphoma      02/12/2016 - 02/21/2016 Hospital Admission    He was admitted to the hospital with sepsis. He received cycle 1 of chemotherapy will hospitalized      02/14/2016 Imaging    Ct abdomen and pelvis showed widespread retrocrural, retroperitoneal, and bilateral pelvic lymphadenopathy, waxing/waning when compared to recent CT, as above. Mild right hydronephrosis, likely secondary to extrinsic compression by right retroperitoneal lymphadenopathy, with  associated diminished enhancement of the right kidney.  Spleen is normal in size.      02/16/2016 Bone Marrow Biopsy    Bone marrow biopsy was negative for lymphoma involvement. Cytogenetics came back abnormal: -Y in 45% of cell lines      02/16/2016 Procedure    He had port placement      02/16/2016 - 02/21/2016 Chemotherapy    He received cycle 1 of R-EPOCH       03/08/2016 - 03/12/2016 Hospital Admission    The patient received cycle 2 of chemotherapy in the hospital      03/27/2016 PET scan    Significant decrease in size of abdominal and pelvic lymphadenopathy compared to previous CT, which shows low-grade metabolic activity. No evidence of active lymphoma within the neck or chest. 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 Continued annual follow-up by CT is recommended.       04/03/2016 - 04/07/2016 Hospital Admission    The patient received cycle 3 of chemotherapy in the hospital      04/20/2016 Procedure    He underwent successful intrathecal injection of chemotherapy without complication      4/82/7078 Pathology Results    [id number] CSF fluid negative for malignant cells      04/24/2016 - 04/28/2016 Hospital Admission    The patient received cycle 4 of chemotherapy in the hospital      05/15/2016 - 05/19/2016 Hospital Admission    He received cycle 5 of chemotherapy in the hospital      05/17/2016 Procedure    Successful intrathecal injection of methotrexate.      06/05/2016 - 06/09/2016 Hospital Admission    He is admitted to the hospital for cycle 6 of chemotherapy      06/07/2016 Procedure    Successful intrathecal chemotherapy injection      06/27/2016 PET scan    Further reduction in size and metabolic activity of lower abdominal and pelvic lymph nodes indicating response to therapy. 2. 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 3. Other imaging findings of potential clinical significance: Mucous retention cyst in the left maxillary sinus. Right mastoid effusion. Left renal cyst. Coronary,  aortic arch, and branch vessel atherosclerotic vascular disease. Aortoiliac atherosclerotic vascular disease. Prominent stool throughout the colon favors constipation. Indirect left inguinal hernia containing adipose tissue. Cholelithiasis.      06/28/2016 Procedure    Intrathecal injection of chemotherapy without complication      0/01/711 PET scan    1. Small LEFT external iliac lymph node is increased metabolic activity compared to prior (similar to liver activity ( [place] 3). No additional hypermetabolic adenopathy in the chest, abdomen, or pelvis. Recommend attention on follow-up. 2. Normal spleen and marrow.      12/19/2016 PET scan    1. No abnormal hypermetabolism in the neck, chest, abdomen or pelvis. 2. Aortic atherosclerosis (ICD10-170.0). Three-vessel coronary artery calcification. 3. Right common iliac artery aneurysm. 4. Cholelithiasis.       INTERVAL HISTORY: Please see below for problem oriented charting. He returns for further follow-up He is doing well No new lymphadenopathy Denies recent infection He is active with activities of daily living Denies chest pain or shortness of breath  REVIEW OF SYSTEMS:   Constitutional: Denies fevers, chills or abnormal weight loss Eyes: Denies blurriness of vision Ears, nose, mouth, throat, and face: Denies mucositis or sore throat Respiratory: Denies cough, dyspnea or wheezes Cardiovascular: Denies palpitation, chest discomfort  Gastrointestinal:  Denies nausea, heartburn or  change in bowel habits Skin: Denies abnormal skin rashes Lymphatics: Denies new lymphadenopathy or easy bruising Neurological:Denies numbness, tingling or new weaknesses Behavioral/Psych: Mood is stable, no new changes  All other systems were reviewed with the patient and are negative.  I have reviewed the past medical history, past surgical history, social history and family history with the patient and they are unchanged from previous  note.  ALLERGIES:  is allergic to bee venom; metformin and related; and sulfa drugs cross reactors.  MEDICATIONS:  Current Outpatient Prescriptions  Medication Sig Dispense Refill  . amLODipine (NORVASC) 10 MG tablet Take 10 mg by mouth every morning.     Marland Kitchen aspirin EC 81 MG tablet Take 81 mg by mouth every morning.     Marland Kitchen atorvastatin (LIPITOR) 40 MG tablet Take 40 mg by mouth every evening.     . clopidogrel (PLAVIX) 75 MG tablet Take 75 mg by mouth daily.    . hydrochlorothiazide (HYDRODIURIL) 25 MG tablet Take 1 tablet (25 mg total) by mouth daily. 60 tablet 3  . hydrOXYzine (ATARAX/VISTARIL) 25 MG tablet Take 1 tablet (25 mg total) by mouth every 6 (six) hours as needed. Alternate with benadryl for itching 30 tablet 0  . lidocaine-prilocaine (EMLA) cream Apply 1 application topically as needed. (Patient taking differently: Apply 1 application topically daily as needed (port access). ) 30 g 6  . pioglitazone (ACTOS) 30 MG tablet Take 30 mg by mouth every morning.      No current facility-administered medications for this visit.     PHYSICAL EXAMINATION: ECOG PERFORMANCE STATUS: 1 - Symptomatic but completely ambulatory  Vitals:   12/27/16 1326  BP: (!) 132/56  Pulse: 81  Resp: 20  Temp: 97.9 F (36.6 C)  SpO2: 99%   Filed Weights   12/27/16 1326  Weight: 227 lb 14.4 oz (103.4 kg)    GENERAL:alert, no distress and comfortable SKIN: skin color, texture, turgor are normal, no rashes or significant lesions EYES: normal, Conjunctiva are pink and non-injected, sclera clear OROPHARYNX:no exudate, no erythema and lips, buccal mucosa, and tongue normal  NECK: supple, thyroid normal size, non-tender, without nodularity LYMPH:  no palpable lymphadenopathy in the cervical, axillary or inguinal LUNGS: clear to auscultation and percussion with normal breathing effort HEART: regular rate & rhythm and no murmurs with mild lower extremity edema ABDOMEN:abdomen soft, non-tender and normal  bowel sounds Musculoskeletal:no cyanosis of digits and no clubbing  NEURO: alert & oriented x 3 with fluent speech, no focal motor/sensory deficits  LABORATORY DATA:  I have reviewed the data as listed    Component Value Date/Time   NA 141 12/18/2016 1403   K 4.4 12/18/2016 1403   CL 108 06/07/2016 0630   CO2 28 12/18/2016 1403   GLUCOSE 125 12/18/2016 1403   BUN 19.3 12/18/2016 1403   CREATININE 1.0 12/18/2016 1403   CALCIUM 9.6 12/18/2016 1403   PROT 6.4 12/18/2016 1403   ALBUMIN 3.9 12/18/2016 1403   AST 16 12/18/2016 1403   ALT 16 12/18/2016 1403   ALKPHOS 66 12/18/2016 1403   BILITOT 0.34 12/18/2016 1403   GFRNONAA >60 06/07/2016 0630   GFRAA >60 06/07/2016 0630    No results found for: SPEP, UPEP  Lab Results  Component Value Date   WBC 7.1 12/18/2016   NEUTROABS 3.1 12/18/2016   HGB 13.4 12/18/2016   HCT 39.7 12/18/2016   MCV 92.1 12/18/2016   PLT 168 12/18/2016      Chemistry  Component Value Date/Time   NA 141 12/18/2016 1403   K 4.4 12/18/2016 1403   CL 108 06/07/2016 0630   CO2 28 12/18/2016 1403   BUN 19.3 12/18/2016 1403   CREATININE 1.0 12/18/2016 1403      Component Value Date/Time   CALCIUM 9.6 12/18/2016 1403   ALKPHOS 66 12/18/2016 1403   AST 16 12/18/2016 1403   ALT 16 12/18/2016 1403   BILITOT 0.34 12/18/2016 1403       RADIOGRAPHIC STUDIES: I have personally reviewed the radiological images as listed and agreed with the findings in the report. Nm Pet Image Restag (ps) Skull Base To Thigh  Result Date: 12/19/2016 CLINICAL DATA:  Subsequent treatment strategy for B-cell lymphoma. EXAM: NUCLEAR MEDICINE PET SKULL BASE TO THIGH TECHNIQUE: 10.8 mCi F-18 FDG was injected intravenously. Full-ring PET imaging was performed from the skull base to thigh after the radiotracer. CT data was obtained and used for attenuation correction and anatomic localization. FASTING BLOOD GLUCOSE:  Value: 125 mg/dl COMPARISON:  09/26/2016. FINDINGS: NECK:  No hypermetabolic lymph nodes. CT images show no acute findings. CHEST: No hypermetabolic mediastinal, hilar or axillary lymph nodes. No hypermetabolic pulmonary nodules. Right IJ Port-A-Cath terminates in the high right atrium. Atherosclerotic calcification of the arterial vasculature, including three-vessel involvement of the coronary arteries. Heart is mildly enlarged. No pericardial effusion. Lungs are clear. No pleural fluid. ABDOMEN/PELVIS: No abnormal hypermetabolism in the liver, adrenal glands, spleen or pancreas. No hypermetabolic lymph nodes. Previously seen hypermetabolic left external iliac lymph node is no longer identified. Liver is unremarkable. A small stone is seen in the gallbladder. Adrenal glands are unremarkable. Low-attenuation lesions in the kidneys measure up to 2.3 cm off the upper pole left kidney and are difficult to definitively characterize without post-contrast imaging. Spleen, pancreas, stomach and bowel are grossly unremarkable. Atherosclerotic calcification of the arterial vasculature. Right common iliac artery measures 2.7 cm. No free fluid. Bilateral inguinal hernias contain fat, left larger than right. SKELETON: No abnormal osseous hypermetabolism. IMPRESSION: 1. No abnormal hypermetabolism in the neck, chest, abdomen or pelvis. 2. Aortic atherosclerosis (ICD10-170.0). Three-vessel coronary artery calcification. 3. Right common iliac artery aneurysm. 4. Cholelithiasis. Electronically Signed   By: Lorin Picket M.D.   On: 12/19/2016 11:11    ASSESSMENT & PLAN:  Diffuse large B-cell lymphoma of lymph nodes of multiple regions [facility]) PET CT scan from 12/19/16 showed no evidence of disease. For now, recommend observation.  He is completely asymptomatic. We will observe with history, physical examination and labs only in 3 months We discussed the importance of influenza vaccination  Bilateral leg edema This is due to morbid obesity, chronic hypertension and others We  discussed the importance of weight loss program and lifestyle modification  Morbid obesity due to excess calories ([place]) The patient had multiple cardiovascular risk factors We discussed the importance of weight loss and lifestyle modification   No orders of the defined types were placed in this encounter.  All questions were answered. The patient knows to call the clinic with any problems, questions or concerns. No barriers to learning was detected. I spent 15 minutes counseling the patient face to face. The total time spent in the appointment was 20 minutes and more than 50% was on counseling and review of test results     Heath Lark, MD 12/28/2016 11:02 AM

## 2016-12-28 NOTE — Assessment & Plan Note (Signed)
PET CT scan from 12/19/16 showed no evidence of disease. For now, recommend observation.  He is completely asymptomatic. We will observe with history, physical examination and labs only in 3 months We discussed the importance of influenza vaccination

## 2016-12-28 NOTE — Assessment & Plan Note (Signed)
The patient had multiple cardiovascular risk factors We discussed the importance of weight loss and lifestyle modification

## 2016-12-28 NOTE — Assessment & Plan Note (Signed)
This is due to morbid obesity, chronic hypertension and others We discussed the importance of weight loss program and lifestyle modification

## 2017-01-16 DIAGNOSIS — Z125 Encounter for screening for malignant neoplasm of prostate: Secondary | ICD-10-CM | POA: Diagnosis not present

## 2017-01-16 DIAGNOSIS — Z23 Encounter for immunization: Secondary | ICD-10-CM | POA: Diagnosis not present

## 2017-01-16 DIAGNOSIS — E7800 Pure hypercholesterolemia, unspecified: Secondary | ICD-10-CM | POA: Diagnosis not present

## 2017-01-16 DIAGNOSIS — Z1389 Encounter for screening for other disorder: Secondary | ICD-10-CM | POA: Diagnosis not present

## 2017-01-16 DIAGNOSIS — Z9889 Other specified postprocedural states: Secondary | ICD-10-CM | POA: Diagnosis not present

## 2017-01-16 DIAGNOSIS — Z8673 Personal history of transient ischemic attack (TIA), and cerebral infarction without residual deficits: Secondary | ICD-10-CM | POA: Diagnosis not present

## 2017-01-16 DIAGNOSIS — I10 Essential (primary) hypertension: Secondary | ICD-10-CM | POA: Diagnosis not present

## 2017-01-16 DIAGNOSIS — E1151 Type 2 diabetes mellitus with diabetic peripheral angiopathy without gangrene: Secondary | ICD-10-CM | POA: Diagnosis not present

## 2017-01-16 DIAGNOSIS — I739 Peripheral vascular disease, unspecified: Secondary | ICD-10-CM | POA: Diagnosis not present

## 2017-01-16 DIAGNOSIS — E1165 Type 2 diabetes mellitus with hyperglycemia: Secondary | ICD-10-CM | POA: Diagnosis not present

## 2017-01-16 DIAGNOSIS — Z8572 Personal history of non-Hodgkin lymphomas: Secondary | ICD-10-CM | POA: Diagnosis not present

## 2017-01-16 DIAGNOSIS — I700 Atherosclerosis of aorta: Secondary | ICD-10-CM | POA: Diagnosis not present

## 2017-02-11 ENCOUNTER — Ambulatory Visit (HOSPITAL_BASED_OUTPATIENT_CLINIC_OR_DEPARTMENT_OTHER): Payer: Medicare Other

## 2017-02-11 VITALS — BP 136/68 | HR 85 | Temp 98.5°F | Resp 20

## 2017-02-11 DIAGNOSIS — Z452 Encounter for adjustment and management of vascular access device: Secondary | ICD-10-CM | POA: Diagnosis present

## 2017-02-11 DIAGNOSIS — C8338 Diffuse large B-cell lymphoma, lymph nodes of multiple sites: Secondary | ICD-10-CM | POA: Diagnosis not present

## 2017-02-11 DIAGNOSIS — Z95828 Presence of other vascular implants and grafts: Secondary | ICD-10-CM

## 2017-02-11 MED ORDER — HEPARIN SOD (PORK) LOCK FLUSH 100 UNIT/ML IV SOLN
500.0000 [IU] | Freq: Once | INTRAVENOUS | Status: AC
  Administered 2017-02-11: 500 [IU]
  Filled 2017-02-11: qty 5

## 2017-02-11 MED ORDER — SODIUM CHLORIDE 0.9% FLUSH
10.0000 mL | Freq: Once | INTRAVENOUS | Status: AC
  Administered 2017-02-11: 10 mL
  Filled 2017-02-11: qty 10

## 2017-02-11 NOTE — Patient Instructions (Signed)
Implanted Port Home Guide An implanted port is a type of central line that is placed under the skin. Central lines are used to provide IV access when treatment or nutrition needs to be given through a person's veins. Implanted ports are used for long-term IV access. An implanted port may be placed because:  You need IV medicine that would be irritating to the small veins in your hands or arms.  You need long-term IV medicines, such as antibiotics.  You need IV nutrition for a long period.  You need frequent blood draws for lab tests.  You need dialysis.  Implanted ports are usually placed in the chest area, but they can also be placed in the upper arm, the abdomen, or the leg. An implanted port has two main parts:  Reservoir. The reservoir is round and will appear as a small, raised area under your skin. The reservoir is the part where a needle is inserted to give medicines or draw blood.  Catheter. The catheter is a thin, flexible tube that extends from the reservoir. The catheter is placed into a large vein. Medicine that is inserted into the reservoir goes into the catheter and then into the vein.  How will I care for my incision site? Do not get the incision site wet. Bathe or shower as directed by your health care provider. How is my port accessed? Special steps must be taken to access the port:  Before the port is accessed, a numbing cream can be placed on the skin. This helps numb the skin over the port site.  Your health care provider uses a sterile technique to access the port. ? Your health care provider must put on a mask and sterile gloves. ? The skin over your port is cleaned carefully with an antiseptic and allowed to dry. ? The port is gently pinched between sterile gloves, and a needle is inserted into the port.  Only "non-coring" port needles should be used to access the port. Once the port is accessed, a blood return should be checked. This helps ensure that the port  is in the vein and is not clogged.  If your port needs to remain accessed for a constant infusion, a clear (transparent) bandage will be placed over the needle site. The bandage and needle will need to be changed every week, or as directed by your health care provider.  Keep the bandage covering the needle clean and dry. Do not get it wet. Follow your health care provider's instructions on how to take a shower or bath while the port is accessed.  If your port does not need to stay accessed, no bandage is needed over the port.  What is flushing? Flushing helps keep the port from getting clogged. Follow your health care provider's instructions on how and when to flush the port. Ports are usually flushed with saline solution or a medicine called heparin. The need for flushing will depend on how the port is used.  If the port is used for intermittent medicines or blood draws, the port will need to be flushed: ? After medicines have been given. ? After blood has been drawn. ? As part of routine maintenance.  If a constant infusion is running, the port may not need to be flushed.  How long will my port stay implanted? The port can stay in for as long as your health care provider thinks it is needed. When it is time for the port to come out, surgery will be   done to remove it. The procedure is similar to the one performed when the port was put in. When should I seek immediate medical care? When you have an implanted port, you should seek immediate medical care if:  You notice a bad smell coming from the incision site.  You have swelling, redness, or drainage at the incision site.  You have more swelling or pain at the port site or the surrounding area.  You have a fever that is not controlled with medicine.  This information is not intended to replace advice given to you by your health care provider. Make sure you discuss any questions you have with your health care provider. Document  Released: 03/19/2005 Document Revised: 08/25/2015 Document Reviewed: 11/24/2012 Elsevier Interactive Patient Education  2017 [employer]  

## 2017-02-13 DIAGNOSIS — Z1211 Encounter for screening for malignant neoplasm of colon: Secondary | ICD-10-CM | POA: Diagnosis not present

## 2017-04-09 ENCOUNTER — Telehealth: Payer: Self-pay | Admitting: Hematology and Oncology

## 2017-04-09 ENCOUNTER — Inpatient Hospital Stay: Payer: Medicare Other | Attending: Hematology and Oncology | Admitting: Hematology and Oncology

## 2017-04-09 ENCOUNTER — Inpatient Hospital Stay: Payer: Medicare Other

## 2017-04-09 ENCOUNTER — Encounter: Payer: Self-pay | Admitting: Hematology and Oncology

## 2017-04-09 ENCOUNTER — Other Ambulatory Visit: Payer: Self-pay | Admitting: Hematology and Oncology

## 2017-04-09 VITALS — BP 142/70 | HR 104 | Temp 98.6°F | Resp 18 | Ht 70.0 in | Wt 223.8 lb

## 2017-04-09 DIAGNOSIS — C8338 Diffuse large B-cell lymphoma, lymph nodes of multiple sites: Secondary | ICD-10-CM | POA: Diagnosis not present

## 2017-04-09 DIAGNOSIS — Z7902 Long term (current) use of antithrombotics/antiplatelets: Secondary | ICD-10-CM | POA: Diagnosis not present

## 2017-04-09 DIAGNOSIS — Z8673 Personal history of transient ischemic attack (TIA), and cerebral infarction without residual deficits: Secondary | ICD-10-CM | POA: Insufficient documentation

## 2017-04-09 DIAGNOSIS — J309 Allergic rhinitis, unspecified: Secondary | ICD-10-CM | POA: Diagnosis not present

## 2017-04-09 DIAGNOSIS — I10 Essential (primary) hypertension: Secondary | ICD-10-CM | POA: Diagnosis not present

## 2017-04-09 DIAGNOSIS — Z95828 Presence of other vascular implants and grafts: Secondary | ICD-10-CM

## 2017-04-09 LAB — CBC WITH DIFFERENTIAL/PLATELET
ABS GRANULOCYTE: 4.1 10*3/uL (ref 1.5–6.5)
Basophils Absolute: 0.1 10*3/uL (ref 0.0–0.1)
Basophils Relative: 1 %
Eosinophils Absolute: 0.6 10*3/uL — ABNORMAL HIGH (ref 0.0–0.5)
Eosinophils Relative: 8 %
HEMATOCRIT: 44 % (ref 38.4–49.9)
HEMOGLOBIN: 14.8 g/dL (ref 13.0–17.1)
LYMPHS ABS: 1.6 10*3/uL (ref 0.9–3.3)
Lymphocytes Relative: 21 %
MCH: 31.2 pg (ref 27.2–33.4)
MCHC: 33.7 g/dL (ref 32.0–36.0)
MCV: 92.7 fL (ref 79.3–98.0)
MONOS PCT: 17 %
Monocytes Absolute: 1.3 10*3/uL — ABNORMAL HIGH (ref 0.1–0.9)
NEUTROS ABS: 4.1 10*3/uL (ref 1.5–6.5)
Neutrophils Relative %: 53 %
Platelets: 167 10*3/uL (ref 140–400)
RBC: 4.74 MIL/uL (ref 4.20–5.82)
RDW: 13.8 % (ref 11.0–15.6)
WBC: 7.7 10*3/uL (ref 4.0–10.3)

## 2017-04-09 LAB — COMPREHENSIVE METABOLIC PANEL
ALK PHOS: 60 U/L (ref 40–150)
ALT: 20 U/L (ref 0–55)
ANION GAP: 8 (ref 3–11)
AST: 17 U/L (ref 5–34)
Albumin: 4.2 g/dL (ref 3.5–5.0)
BUN: 16 mg/dL (ref 7–26)
CALCIUM: 9.7 mg/dL (ref 8.4–10.4)
CO2: 28 mmol/L (ref 22–29)
Chloride: 102 mmol/L (ref 98–109)
Creatinine, Ser: 0.86 mg/dL (ref 0.70–1.30)
GFR calc non Af Amer: >60 mL/min (ref >60)
GLUCOSE: 106 mg/dL (ref 70–140)
Potassium: 4.2 mmol/L (ref 3.5–5.1)
Sodium: 138 mmol/L (ref 136–145)
Total Bilirubin: 0.5 mg/dL (ref 0.2–1.2)
Total Protein: 6.7 g/dL (ref 6.4–8.3)

## 2017-04-09 LAB — LACTATE DEHYDROGENASE: LDH: 188 U/L (ref 125–245)

## 2017-04-09 MED ORDER — SODIUM CHLORIDE 0.9% FLUSH
10.0000 mL | Freq: Once | INTRAVENOUS | Status: AC
  Administered 2017-04-09: 10 mL
  Filled 2017-04-09: qty 10

## 2017-04-09 MED ORDER — HEPARIN SOD (PORK) LOCK FLUSH 100 UNIT/ML IV SOLN
500.0000 [IU] | Freq: Once | INTRAVENOUS | Status: AC
  Administered 2017-04-09: 500 [IU]
  Filled 2017-04-09: qty 5

## 2017-04-09 NOTE — Assessment & Plan Note (Signed)
I am very proud that the patient have lost almost 10 pounds of weight due to dietary modification I encouraged him to increase activity as tolerated

## 2017-04-09 NOTE — Telephone Encounter (Signed)
Gave patient AVs and calendar of upcoming February and April appointments.

## 2017-04-09 NOTE — Assessment & Plan Note (Signed)
He has mild symptoms of cough and congestion Clinically, he does not appear to have evidence of infection I suspect it is due to allergic rhinitis especially with mild increased eosinophil count I recommend over-the-counter remedies

## 2017-04-09 NOTE — Patient Instructions (Signed)
Implanted Port Home Guide An implanted port is a type of central line that is placed under the skin. Central lines are used to provide IV access when treatment or nutrition needs to be given through a person's veins. Implanted ports are used for long-term IV access. An implanted port may be placed because:  You need IV medicine that would be irritating to the small veins in your hands or arms.  You need long-term IV medicines, such as antibiotics.  You need IV nutrition for a long period.  You need frequent blood draws for lab tests.  You need dialysis.  Implanted ports are usually placed in the chest area, but they can also be placed in the upper arm, the abdomen, or the leg. An implanted port has two main parts:  Reservoir. The reservoir is round and will appear as a small, raised area under your skin. The reservoir is the part where a needle is inserted to give medicines or draw blood.  Catheter. The catheter is a thin, flexible tube that extends from the reservoir. The catheter is placed into a large vein. Medicine that is inserted into the reservoir goes into the catheter and then into the vein.  How will I care for my incision site? Do not get the incision site wet. Bathe or shower as directed by your health care provider. How is my port accessed? Special steps must be taken to access the port:  Before the port is accessed, a numbing cream can be placed on the skin. This helps numb the skin over the port site.  Your health care provider uses a sterile technique to access the port. ? Your health care provider must put on a mask and sterile gloves. ? The skin over your port is cleaned carefully with an antiseptic and allowed to dry. ? The port is gently pinched between sterile gloves, and a needle is inserted into the port.  Only "non-coring" port needles should be used to access the port. Once the port is accessed, a blood return should be checked. This helps ensure that the port  is in the vein and is not clogged.  If your port needs to remain accessed for a constant infusion, a clear (transparent) bandage will be placed over the needle site. The bandage and needle will need to be changed every week, or as directed by your health care provider.  Keep the bandage covering the needle clean and dry. Do not get it wet. Follow your health care provider's instructions on how to take a shower or bath while the port is accessed.  If your port does not need to stay accessed, no bandage is needed over the port.  What is flushing? Flushing helps keep the port from getting clogged. Follow your health care provider's instructions on how and when to flush the port. Ports are usually flushed with saline solution or a medicine called heparin. The need for flushing will depend on how the port is used.  If the port is used for intermittent medicines or blood draws, the port will need to be flushed: ? After medicines have been given. ? After blood has been drawn. ? As part of routine maintenance.  If a constant infusion is running, the port may not need to be flushed.  How long will my port stay implanted? The port can stay in for as long as your health care provider thinks it is needed. When it is time for the port to come out, surgery will be   done to remove it. The procedure is similar to the one performed when the port was put in. When should I seek immediate medical care? When you have an implanted port, you should seek immediate medical care if:  You notice a bad smell coming from the incision site.  You have swelling, redness, or drainage at the incision site.  You have more swelling or pain at the port site or the surrounding area.  You have a fever that is not controlled with medicine.  This information is not intended to replace advice given to you by your health care provider. Make sure you discuss any questions you have with your health care provider. Document  Released: 03/19/2005 Document Revised: 08/25/2015 Document Reviewed: 11/24/2012 Elsevier Interactive Patient Education  2017 [employer]  

## 2017-04-09 NOTE — Assessment & Plan Note (Signed)
PET CT scan from 12/19/16 showed no evidence of disease. For now, recommend observation.  He is completely asymptomatic. I plan to see him in 3 months with repeat history, physical examination and blood work, along with CT imaging Today, he has no signs of cancer recurrence

## 2017-04-09 NOTE — Progress Notes (Signed)
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 Stable ectasia or post stenotic dilatation of the right external iliac artery measuring up to 1.5 cm. Extensive atherosclerotic disease involving the abdominal aorta with a chronic intimal flap or short segment dissection in the infrarenal abdominal aorta. Chronic occlusion of the left common iliac artery. Slowly enlarging retroperitoneal lymph nodes as described. An indolent neoplastic or lymphoproliferative process cannot be excluded      01/06/2016 Imaging    Ct abdomen and pelvis showed new extensive centrally necrotic retroperitoneal and bilateral pelvic lymphadenopathy. Differential includes lymphoproliferative condition (lymphoma/leukemia) or infectious lymphadenitis (such as due to bacterial, viral or mycobacterial etiologies). Squamous cell carcinoma metastases can have a similar appearance, although this is less likely given the location. Clinical and laboratory evaluation for a lymphoproliferative condition and tissue sampling is advised. 2. Aortic atherosclerosis. Stable 3.1 cm infrarenal abdominal aortic aneurysm      01/27/2016 Imaging    CH chest showed no acute cardiopulmonary disease. Known stable retrocrural and periaortic adenopathy. Oval density over the left neck base measuring 2.5 x 4.5 cm likely due partially to prominent internal jugular vein, although cannot exclude adenopathy in this region. Recommend neck CT with contrast. Three  vessel atherosclerotic coronary artery disease. Few small thyroid nodules with the largest measuring 1.2 cm over the left lobe. 2.4 cm left renal cyst. Sub cm right renal hypodensity unchanged and too small to characterize but likely a cyst. Minimal cholelithiasis.      01/31/2016 Imaging    ECHO showed: Left ventricle: The cavity size was normal. Systolic function was normal. The estimated ejection fraction was in the range of 60% to 65%. Wall motion was normal; there were no regional wall motion abnormalities. There was an increased relative contribution of atrial contraction to ventricular filling. Doppler parameters are consistent with abnormal left ventricular relaxation (grade 1 diastolic dysfunction).      02/07/2016 Procedure    He had imaging guided biopsy of supraclavicular lymph node      02/07/2016 Pathology Results    Accession: [id number] Core biopsies reveal diffuse areas of large atypical lymphoid cells. There are multiple cells with very large irregular nuclei or multinucleated cells. Immunohistochemistry reveals the cells are positive for CD20, CD45, bcl-2, and bcl-6. They are negative for CD10, CD30, and ALK. CD3, CD4, CD5 and CD8 reveal scattered T-cells. Ki-67 reveals an elevated proliferation rate (95%). The cells are negative for cytokeratin 7, cytokeratin 20, TTF-1, CDX-2, HMB45, SOX11, and MelanA. Overall, these findings are consistent with a diffuse large B-cell lymphoma      02/12/2016 - 02/21/2016 Hospital Admission    He was admitted to the hospital with sepsis. He received cycle 1 of chemotherapy will hospitalized      02/14/2016 Imaging    Ct abdomen and pelvis showed widespread retrocrural, retroperitoneal, and bilateral pelvic lymphadenopathy, waxing/waning when compared to recent CT, as above. Mild right hydronephrosis, likely secondary to extrinsic compression by right retroperitoneal lymphadenopathy, with  associated diminished enhancement of the right kidney.  Spleen is normal in size.      02/16/2016 Bone Marrow Biopsy    Bone marrow biopsy was negative for lymphoma involvement. Cytogenetics came back abnormal: -Y in 45% of cell lines      02/16/2016 Procedure    He had port placement      02/16/2016 - 02/21/2016 Chemotherapy    He received cycle 1 of R-EPOCH       03/08/2016 - 03/12/2016 Hospital Admission    The patient received cycle 2 of chemotherapy in the hospital      03/27/2016 PET scan    Significant decrease in size of abdominal and pelvic lymphadenopathy compared to previous CT, which shows low-grade metabolic activity. No evidence of active lymphoma within the neck or chest. 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 Continued annual follow-up by CT is recommended.       04/03/2016 - 04/07/2016 Hospital Admission    The patient received cycle 3 of chemotherapy in the hospital      04/20/2016 Procedure    He underwent successful intrathecal injection of chemotherapy without complication      08/02/7739 Pathology Results    [id number] CSF fluid negative for malignant cells      04/24/2016 - 04/28/2016 Hospital Admission    The patient received cycle 4 of chemotherapy in the hospital      05/15/2016 - 05/19/2016 Hospital Admission    He received cycle 5 of chemotherapy in the hospital      05/17/2016 Procedure    Successful intrathecal injection of methotrexate.      06/05/2016 - 06/09/2016 Hospital Admission    He is admitted to the hospital for cycle 6 of chemotherapy      06/07/2016 Procedure    Successful intrathecal chemotherapy injection      06/27/2016 PET scan    Further reduction in size and metabolic activity of lower abdominal and pelvic lymph nodes indicating response to therapy. 2. 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 3. Other imaging findings of potential clinical significance: Mucous retention cyst in the left maxillary sinus. Right mastoid effusion. Left renal cyst. Coronary,  aortic arch, and branch vessel atherosclerotic vascular disease. Aortoiliac atherosclerotic vascular disease. Prominent stool throughout the colon favors constipation. Indirect left inguinal hernia containing adipose tissue. Cholelithiasis.      06/28/2016 Procedure    Intrathecal injection of chemotherapy without complication      10/19/9468 PET scan    1. Small LEFT external iliac lymph node is increased metabolic activity compared to prior (similar to liver activity ( [place] 3). No additional hypermetabolic adenopathy in the chest, abdomen, or pelvis. Recommend attention on follow-up. 2. Normal spleen and marrow.      12/19/2016 PET scan    1. No abnormal hypermetabolism in the neck, chest, abdomen or pelvis. 2. Aortic atherosclerosis (ICD10-170.0). Three-vessel coronary artery calcification. 3. Right common iliac artery aneurysm. 4. Cholelithiasis.       INTERVAL HISTORY: Please see below for problem oriented charting. He returns for further follow-up He is doing well since last time I saw him He appears very motivated to lose weight and have lost almost 10 pounds of weight with dietary modification He has stopped taking diabetes medication He denies lymphadenopathy He has mild sinus congestion and cough but denies fever or chills  REVIEW OF SYSTEMS:   Constitutional: Denies fevers, chills or abnormal weight loss Eyes: Denies blurriness of vision Ears, nose, mouth, throat, and  face: Denies mucositis or sore throat Cardiovascular: Denies palpitation, chest discomfort or lower extremity swelling Gastrointestinal:  Denies nausea, heartburn or change in bowel habits Skin: Denies abnormal skin rashes Lymphatics: Denies new lymphadenopathy or easy bruising Neurological:Denies numbness, tingling or new weaknesses Behavioral/Psych: Mood is stable, no new changes  All other systems were reviewed with the patient and are negative.  I have reviewed the past medical history, past  surgical history, social history and family history with the patient and they are unchanged from previous note.  ALLERGIES:  is allergic to bee venom; metformin and related; and sulfa drugs cross reactors.  MEDICATIONS:  Current Outpatient Medications  Medication Sig Dispense Refill  . amLODipine (NORVASC) 10 MG tablet Take 10 mg by mouth every morning.     Marland Kitchen aspirin EC 81 MG tablet Take 81 mg by mouth every morning.     Marland Kitchen atorvastatin (LIPITOR) 40 MG tablet Take 40 mg by mouth every evening.     . clopidogrel (PLAVIX) 75 MG tablet Take 75 mg by mouth daily.    . hydrochlorothiazide (HYDRODIURIL) 25 MG tablet Take 1 tablet (25 mg total) by mouth daily. 60 tablet 3  . hydrOXYzine (ATARAX/VISTARIL) 25 MG tablet Take 1 tablet (25 mg total) by mouth every 6 (six) hours as needed. Alternate with benadryl for itching 30 tablet 0  . lidocaine-prilocaine (EMLA) cream Apply 1 application topically as needed. (Patient taking differently: Apply 1 application topically daily as needed (port access). ) 30 g 6  . pioglitazone (ACTOS) 30 MG tablet Take 30 mg by mouth every morning.      No current facility-administered medications for this visit.     PHYSICAL EXAMINATION: ECOG PERFORMANCE STATUS: 0 - Asymptomatic  Vitals:   04/09/17 1330  BP: (!) 142/70  Pulse: (!) 104  Resp: 18  Temp: 98.6 F (37 C)  SpO2: 100%   Filed Weights   04/09/17 1330  Weight: 223 lb 12.8 oz (101.5 kg)    GENERAL:alert, no distress and comfortable SKIN: skin color, texture, turgor are normal, no rashes or significant lesions EYES: normal, Conjunctiva are pink and non-injected, sclera clear OROPHARYNX:no exudate, no erythema and lips, buccal mucosa, and tongue normal  NECK: supple, thyroid normal size, non-tender, without nodularity LYMPH:  no palpable lymphadenopathy in the cervical, axillary or inguinal LUNGS: clear to auscultation and percussion with normal breathing effort HEART: regular rate & rhythm and no  murmurs and no lower extremity edema ABDOMEN:abdomen soft, non-tender and normal bowel sounds Musculoskeletal:no cyanosis of digits and no clubbing  NEURO: alert & oriented x 3 with fluent speech, no focal motor/sensory deficits  LABORATORY DATA:  I have reviewed the data as listed    Component Value Date/Time   NA 138 04/09/2017 1222   NA 141 12/18/2016 1403   K 4.2 04/09/2017 1222   K 4.4 12/18/2016 1403   CL 102 04/09/2017 1222   CO2 28 04/09/2017 1222   CO2 28 12/18/2016 1403   GLUCOSE 106 04/09/2017 1222   GLUCOSE 125 12/18/2016 1403   BUN 16 04/09/2017 1222   BUN 19.3 12/18/2016 1403   CREATININE 0.86 04/09/2017 1222   CREATININE 1.0 12/18/2016 1403   CALCIUM 9.7 04/09/2017 1222   CALCIUM 9.6 12/18/2016 1403   PROT 6.7 04/09/2017 1222   PROT 6.4 12/18/2016 1403   ALBUMIN 4.2 04/09/2017 1222   ALBUMIN 3.9 12/18/2016 1403   AST 17 04/09/2017 1222   AST 16 12/18/2016 1403   ALT 20 04/09/2017 1222  ALT 16 12/18/2016 1403   ALKPHOS 60 04/09/2017 1222   ALKPHOS 66 12/18/2016 1403   BILITOT 0.5 04/09/2017 1222   BILITOT 0.34 12/18/2016 1403   GFRNONAA >60 04/09/2017 1222   GFRAA >60 04/09/2017 1222    No results found for: SPEP, UPEP  Lab Results  Component Value Date   WBC 7.7 04/09/2017   NEUTROABS 4.1 04/09/2017   HGB 14.8 04/09/2017   HCT 44.0 04/09/2017   MCV 92.7 04/09/2017   PLT 167 04/09/2017      Chemistry      Component Value Date/Time   NA 138 04/09/2017 1222   NA 141 12/18/2016 1403   K 4.2 04/09/2017 1222   K 4.4 12/18/2016 1403   CL 102 04/09/2017 1222   CO2 28 04/09/2017 1222   CO2 28 12/18/2016 1403   BUN 16 04/09/2017 1222   BUN 19.3 12/18/2016 1403   CREATININE 0.86 04/09/2017 1222   CREATININE 1.0 12/18/2016 1403      Component Value Date/Time   CALCIUM 9.7 04/09/2017 1222   CALCIUM 9.6 12/18/2016 1403   ALKPHOS 60 04/09/2017 1222   ALKPHOS 66 12/18/2016 1403   AST 17 04/09/2017 1222   AST 16 12/18/2016 1403   ALT 20  04/09/2017 1222   ALT 16 12/18/2016 1403   BILITOT 0.5 04/09/2017 1222   BILITOT 0.34 12/18/2016 1403       ASSESSMENT & PLAN:  Diffuse large B-cell lymphoma of lymph nodes of multiple regions [facility]) PET CT scan from 12/19/16 showed no evidence of disease. For now, recommend observation.  He is completely asymptomatic. I plan to see him in 3 months with repeat history, physical examination and blood work, along with CT imaging Today, he has no signs of cancer recurrence  Morbid obesity due to excess calories ([place]) I am very proud that the patient have lost almost 10 pounds of weight due to dietary modification I encouraged him to increase activity as tolerated  H/O: CVA (cerebrovascular accident) The patient have history of stroke with diabetes and hypertension Since he has lost some weight, he has stopped taking diabetes medication His blood pressure is just mildly elevated and he will continue blood pressure medication adjustment through his primary care provider He will continue Plavix to prevent recurrence of stroke I encouraged him to increase exercise as tolerated and the patient appears motivated  Allergic rhinitis He has mild symptoms of cough and congestion Clinically, he does not appear to have evidence of infection I suspect it is due to allergic rhinitis especially with mild increased eosinophil count I recommend over-the-counter remedies   Orders Placed This Encounter  Procedures  . CT ABDOMEN PELVIS W CONTRAST    Standing Status:   Future    Standing Expiration Date:   04/09/2018    Order Specific Question:   If indicated for the ordered procedure, I authorize the administration of contrast media per Radiology protocol    Answer:   Yes    Order Specific Question:   Preferred imaging location?    Answer:   [facility]    Order Specific Question:   Radiology Contrast Protocol - do NOT remove file path    Answer:    [email or web address]  . CT CHEST W CONTRAST    Standing Status:   Future    Standing Expiration Date:   05/14/2018    Order Specific Question:   If indicated for the ordered procedure, I authorize the administration of contrast media per Radiology protocol  Answer:   Yes    Order Specific Question:   Preferred imaging location?    Answer:   [facility]    Order Specific Question:   Radiology Contrast Protocol - do NOT remove file path    Answer:   [email or web address]  . Comprehensive metabolic panel    Standing Status:   Future    Standing Expiration Date:   05/14/2018  . CBC with Differential/Platelet    Standing Status:   Future    Standing Expiration Date:   05/14/2018  . Lactate dehydrogenase    Standing Status:   Future    Standing Expiration Date:   05/14/2018   All questions were answered. The patient knows to call the clinic with any problems, questions or concerns. No barriers to learning was detected. I spent 15 minutes counseling the patient face to face. The total time spent in the appointment was 20 minutes and more than 50% was on counseling and review of test results     Heath Lark, MD 04/09/2017 6:24 PM

## 2017-04-09 NOTE — Assessment & Plan Note (Signed)
The patient have history of stroke with diabetes and hypertension Since he has lost some weight, he has stopped taking diabetes medication His blood pressure is just mildly elevated and he will continue blood pressure medication adjustment through his primary care provider He will continue Plavix to prevent recurrence of stroke I encouraged him to increase exercise as tolerated and the patient appears motivated

## 2017-04-15 DIAGNOSIS — I10 Essential (primary) hypertension: Secondary | ICD-10-CM | POA: Diagnosis not present

## 2017-04-15 DIAGNOSIS — R05 Cough: Secondary | ICD-10-CM | POA: Diagnosis not present

## 2017-04-15 DIAGNOSIS — C8510 Unspecified B-cell lymphoma, unspecified site: Secondary | ICD-10-CM | POA: Diagnosis not present

## 2017-04-15 DIAGNOSIS — I639 Cerebral infarction, unspecified: Secondary | ICD-10-CM | POA: Diagnosis not present

## 2017-04-15 DIAGNOSIS — E1151 Type 2 diabetes mellitus with diabetic peripheral angiopathy without gangrene: Secondary | ICD-10-CM | POA: Diagnosis not present

## 2017-04-15 DIAGNOSIS — E7800 Pure hypercholesterolemia, unspecified: Secondary | ICD-10-CM | POA: Diagnosis not present

## 2017-05-03 DIAGNOSIS — H26491 Other secondary cataract, right eye: Secondary | ICD-10-CM | POA: Diagnosis not present

## 2017-05-03 DIAGNOSIS — H47012 Ischemic optic neuropathy, left eye: Secondary | ICD-10-CM | POA: Diagnosis not present

## 2017-05-03 DIAGNOSIS — Z961 Presence of intraocular lens: Secondary | ICD-10-CM | POA: Diagnosis not present

## 2017-05-03 DIAGNOSIS — H52223 Regular astigmatism, bilateral: Secondary | ICD-10-CM | POA: Diagnosis not present

## 2017-05-03 DIAGNOSIS — H5203 Hypermetropia, bilateral: Secondary | ICD-10-CM | POA: Diagnosis not present

## 2017-05-21 ENCOUNTER — Inpatient Hospital Stay: Payer: Medicare Other | Attending: Hematology and Oncology

## 2017-05-21 DIAGNOSIS — Z95828 Presence of other vascular implants and grafts: Secondary | ICD-10-CM

## 2017-05-21 DIAGNOSIS — Z9221 Personal history of antineoplastic chemotherapy: Secondary | ICD-10-CM | POA: Diagnosis not present

## 2017-05-21 DIAGNOSIS — H26491 Other secondary cataract, right eye: Secondary | ICD-10-CM | POA: Diagnosis not present

## 2017-05-21 DIAGNOSIS — Z452 Encounter for adjustment and management of vascular access device: Secondary | ICD-10-CM | POA: Diagnosis not present

## 2017-05-21 DIAGNOSIS — C8338 Diffuse large B-cell lymphoma, lymph nodes of multiple sites: Secondary | ICD-10-CM

## 2017-05-21 DIAGNOSIS — H353130 Nonexudative age-related macular degeneration, bilateral, stage unspecified: Secondary | ICD-10-CM | POA: Diagnosis not present

## 2017-05-21 DIAGNOSIS — H02839 Dermatochalasis of unspecified eye, unspecified eyelid: Secondary | ICD-10-CM | POA: Diagnosis not present

## 2017-05-21 DIAGNOSIS — I10 Essential (primary) hypertension: Secondary | ICD-10-CM | POA: Diagnosis not present

## 2017-05-21 DIAGNOSIS — Z961 Presence of intraocular lens: Secondary | ICD-10-CM | POA: Diagnosis not present

## 2017-05-21 MED ORDER — SODIUM CHLORIDE 0.9% FLUSH
10.0000 mL | Freq: Once | INTRAVENOUS | Status: AC
  Administered 2017-05-21: 10 mL
  Filled 2017-05-21: qty 10

## 2017-05-21 MED ORDER — HEPARIN SOD (PORK) LOCK FLUSH 100 UNIT/ML IV SOLN
500.0000 [IU] | Freq: Once | INTRAVENOUS | Status: AC
  Administered 2017-05-21: 500 [IU]
  Filled 2017-05-21: qty 5

## 2017-05-30 DIAGNOSIS — H5201 Hypermetropia, right eye: Secondary | ICD-10-CM | POA: Diagnosis not present

## 2017-05-30 DIAGNOSIS — Z961 Presence of intraocular lens: Secondary | ICD-10-CM | POA: Diagnosis not present

## 2017-05-30 DIAGNOSIS — H52221 Regular astigmatism, right eye: Secondary | ICD-10-CM | POA: Diagnosis not present

## 2017-06-18 DIAGNOSIS — K090 Developmental odontogenic cysts: Secondary | ICD-10-CM | POA: Diagnosis not present

## 2017-06-19 DIAGNOSIS — K090 Developmental odontogenic cysts: Secondary | ICD-10-CM | POA: Diagnosis not present

## 2017-06-27 DIAGNOSIS — K090 Developmental odontogenic cysts: Secondary | ICD-10-CM | POA: Diagnosis not present

## 2017-07-08 ENCOUNTER — Ambulatory Visit (HOSPITAL_COMMUNITY)
Admission: RE | Admit: 2017-07-08 | Discharge: 2017-07-08 | Disposition: A | Discharge status: Home / Self Care | Payer: Medicare Other | Source: Ambulatory Visit | Attending: Hematology and Oncology | Admitting: Hematology and Oncology

## 2017-07-08 ENCOUNTER — Encounter (HOSPITAL_COMMUNITY): Payer: Self-pay

## 2017-07-08 ENCOUNTER — Inpatient Hospital Stay: Payer: Medicare Other | Attending: Hematology and Oncology

## 2017-07-08 DIAGNOSIS — I739 Peripheral vascular disease, unspecified: Secondary | ICD-10-CM | POA: Diagnosis not present

## 2017-07-08 DIAGNOSIS — R600 Localized edema: Secondary | ICD-10-CM | POA: Insufficient documentation

## 2017-07-08 DIAGNOSIS — K4090 Unilateral inguinal hernia, without obstruction or gangrene, not specified as recurrent: Secondary | ICD-10-CM | POA: Insufficient documentation

## 2017-07-08 DIAGNOSIS — I10 Essential (primary) hypertension: Secondary | ICD-10-CM | POA: Insufficient documentation

## 2017-07-08 DIAGNOSIS — C8338 Diffuse large B-cell lymphoma, lymph nodes of multiple sites: Secondary | ICD-10-CM | POA: Diagnosis not present

## 2017-07-08 DIAGNOSIS — I700 Atherosclerosis of aorta: Secondary | ICD-10-CM | POA: Insufficient documentation

## 2017-07-08 DIAGNOSIS — I2510 Atherosclerotic heart disease of native coronary artery without angina pectoris: Secondary | ICD-10-CM | POA: Diagnosis not present

## 2017-07-08 DIAGNOSIS — Z79899 Other long term (current) drug therapy: Secondary | ICD-10-CM | POA: Insufficient documentation

## 2017-07-08 DIAGNOSIS — I723 Aneurysm of iliac artery: Secondary | ICD-10-CM | POA: Insufficient documentation

## 2017-07-08 DIAGNOSIS — Z9221 Personal history of antineoplastic chemotherapy: Secondary | ICD-10-CM | POA: Diagnosis not present

## 2017-07-08 DIAGNOSIS — Z794 Long term (current) use of insulin: Secondary | ICD-10-CM | POA: Insufficient documentation

## 2017-07-08 DIAGNOSIS — Z7982 Long term (current) use of aspirin: Secondary | ICD-10-CM | POA: Insufficient documentation

## 2017-07-08 DIAGNOSIS — R918 Other nonspecific abnormal finding of lung field: Secondary | ICD-10-CM | POA: Diagnosis not present

## 2017-07-08 DIAGNOSIS — J449 Chronic obstructive pulmonary disease, unspecified: Secondary | ICD-10-CM | POA: Insufficient documentation

## 2017-07-08 DIAGNOSIS — E1165 Type 2 diabetes mellitus with hyperglycemia: Secondary | ICD-10-CM | POA: Diagnosis not present

## 2017-07-08 DIAGNOSIS — Z8572 Personal history of non-Hodgkin lymphomas: Secondary | ICD-10-CM | POA: Diagnosis not present

## 2017-07-08 HISTORY — DX: Malignant (primary) neoplasm, unspecified: C80.1

## 2017-07-08 LAB — COMPREHENSIVE METABOLIC PANEL
ALBUMIN: 4.1 g/dL (ref 3.5–5.0)
ALT: 22 U/L (ref 0–55)
ANION GAP: 10 (ref 3–11)
AST: 18 U/L (ref 5–34)
Alkaline Phosphatase: 62 U/L (ref 40–150)
BILIRUBIN TOTAL: 0.6 mg/dL (ref 0.2–1.2)
BUN: 14 mg/dL (ref 7–26)
CO2: 29 mmol/L (ref 22–29)
Calcium: 9.7 mg/dL (ref 8.4–10.4)
Chloride: 101 mmol/L (ref 98–109)
Creatinine, Ser: 0.89 mg/dL (ref 0.70–1.30)
GFR calc Af Amer: >60 mL/min (ref >60)
GLUCOSE: 155 mg/dL — AB (ref 70–140)
POTASSIUM: 5.1 mmol/L (ref 3.5–5.1)
Sodium: 140 mmol/L (ref 136–145)
TOTAL PROTEIN: 6.8 g/dL (ref 6.4–8.3)

## 2017-07-08 LAB — CBC WITH DIFFERENTIAL/PLATELET
Basophils Absolute: 0.1 10*3/uL (ref 0.0–0.1)
Basophils Relative: 1 %
Eosinophils Absolute: 0.7 10*3/uL — ABNORMAL HIGH (ref 0.0–0.5)
Eosinophils Relative: 10 %
HEMATOCRIT: 45.5 % (ref 38.4–49.9)
HEMOGLOBIN: 15.3 g/dL (ref 13.0–17.1)
LYMPHS ABS: 1.4 10*3/uL (ref 0.9–3.3)
LYMPHS PCT: 20 %
MCH: 31.4 pg (ref 27.2–33.4)
MCHC: 33.7 g/dL (ref 32.0–36.0)
MCV: 93.1 fL (ref 79.3–98.0)
MONO ABS: 1.2 10*3/uL — AB (ref 0.1–0.9)
MONOS PCT: 17 %
NEUTROS ABS: 3.6 10*3/uL (ref 1.5–6.5)
NEUTROS PCT: 52 %
Platelets: 177 10*3/uL (ref 140–400)
RBC: 4.88 MIL/uL (ref 4.20–5.82)
RDW: 14.1 % (ref 11.0–14.6)
WBC: 6.9 10*3/uL (ref 4.0–10.3)

## 2017-07-08 LAB — LACTATE DEHYDROGENASE: LDH: 186 U/L (ref 125–245)

## 2017-07-08 MED ORDER — IOHEXOL 300 MG/ML  SOLN
100.0000 mL | Freq: Once | INTRAMUSCULAR | Status: AC | PRN
  Administered 2017-07-08: 100 mL via INTRAVENOUS

## 2017-07-08 MED ORDER — IOPAMIDOL (ISOVUE-300) INJECTION 61%: 100.0000 mL | Freq: Once | INTRAVENOUS | Status: DC | PRN

## 2017-07-08 MED ORDER — HEPARIN SOD (PORK) LOCK FLUSH 100 UNIT/ML IV SOLN
INTRAVENOUS | Status: AC
  Filled 2017-07-08: qty 5

## 2017-07-09 ENCOUNTER — Telehealth: Payer: Self-pay | Admitting: Hematology and Oncology

## 2017-07-09 ENCOUNTER — Encounter: Payer: Self-pay | Admitting: Hematology and Oncology

## 2017-07-09 ENCOUNTER — Inpatient Hospital Stay (HOSPITAL_BASED_OUTPATIENT_CLINIC_OR_DEPARTMENT_OTHER): Payer: Medicare Other | Admitting: Hematology and Oncology

## 2017-07-09 DIAGNOSIS — I739 Peripheral vascular disease, unspecified: Secondary | ICD-10-CM | POA: Diagnosis not present

## 2017-07-09 DIAGNOSIS — I723 Aneurysm of iliac artery: Secondary | ICD-10-CM

## 2017-07-09 DIAGNOSIS — C8338 Diffuse large B-cell lymphoma, lymph nodes of multiple sites: Secondary | ICD-10-CM | POA: Diagnosis not present

## 2017-07-09 DIAGNOSIS — L820 Inflamed seborrheic keratosis: Secondary | ICD-10-CM | POA: Diagnosis not present

## 2017-07-09 DIAGNOSIS — E118 Type 2 diabetes mellitus with unspecified complications: Secondary | ICD-10-CM

## 2017-07-09 DIAGNOSIS — R600 Localized edema: Secondary | ICD-10-CM | POA: Diagnosis not present

## 2017-07-09 DIAGNOSIS — Z9221 Personal history of antineoplastic chemotherapy: Secondary | ICD-10-CM | POA: Diagnosis not present

## 2017-07-09 DIAGNOSIS — I10 Essential (primary) hypertension: Secondary | ICD-10-CM

## 2017-07-09 DIAGNOSIS — E119 Type 2 diabetes mellitus without complications: Secondary | ICD-10-CM

## 2017-07-09 DIAGNOSIS — J449 Chronic obstructive pulmonary disease, unspecified: Secondary | ICD-10-CM | POA: Diagnosis not present

## 2017-07-09 DIAGNOSIS — D485 Neoplasm of uncertain behavior of skin: Secondary | ICD-10-CM | POA: Diagnosis not present

## 2017-07-09 DIAGNOSIS — Z794 Long term (current) use of insulin: Secondary | ICD-10-CM | POA: Diagnosis not present

## 2017-07-09 DIAGNOSIS — E1165 Type 2 diabetes mellitus with hyperglycemia: Secondary | ICD-10-CM | POA: Diagnosis not present

## 2017-07-09 DIAGNOSIS — Z85828 Personal history of other malignant neoplasm of skin: Secondary | ICD-10-CM | POA: Diagnosis not present

## 2017-07-09 NOTE — Progress Notes (Signed)
[facility] OFFICE PROGRESS NOTE  Patient Care Team: Wenda Low, MD as PCP - General (Internal Medicine)  ASSESSMENT & PLAN:  Diffuse large B-cell lymphoma of lymph nodes of multiple regions [facility]) CT scan showed no evidence of lymphoma recurrence I plan to see him back in 6 months with repeat history, physical examination and blood work only We will also schedule port flushes to maintain port patency for at least 2 years since discontinuation of treatment  Type 2 diabetes mellitus with complication, without long-term current use of insulin ([place]) The patient has significant peripheral vascular disease His fasting blood sugar is high We discussed risk factor modification and dietary change The patient appears motivated to lose weight  Essential hypertension He has intermittent elevated blood pressure could be due to component of whitecoat hypertension He will continue his current prescription blood pressure medicine as directed  Bilateral leg edema This is chronic and stable Continue medical management  Aneurysm of iliac artery ([place]) The right iliac artery aneurysm appears slightly worse He has significant cardiovascular risk factor I recommend him to call his vascular surgeon for further follow-up and discuss whether repair is appropriate  COPD, mild ([place]) He has mild COPD with expiratory wheezes CT scan show mild abnormality but he has no signs or symptoms to suggest infection I recommend conservative management with over-the-counter antihistamine for component of allergies His blood counts show mild eosinophilia   No orders of the defined types were placed in this encounter.   INTERVAL HISTORY: Please see below for problem oriented charting. He returns for further follow-up He has gained some weight recently He has chronic bilateral lower extremity edema and has self discontinued metformin He has not follow with vascular surgeon for some time He has  mild expiratory wheezes but denies fever, chills or cough No new lymphadenopathy Denies chest pain, shortness of breath or dizziness He denies recent infection  SUMMARY OF ONCOLOGIC HISTORY: Oncology History   NCCN-IPI  Age: 72 points LDH ratio >1: 1 point ECOG >2 at presentation: 1 point [place] Stage III: 1 point  Total 5 points High intermediate risk catergory: OS 38%, PFS 54%     Diffuse large B-cell lymphoma of lymph nodes of multiple regions ([place])   03/07/2015 Imaging    Ct angiogram showed stable aneurysm of the right common iliac artery measuring up to 2.8 cm. Stable ectasia or post stenotic dilatation of the right external iliac artery measuring up to 1.5 cm. Extensive atherosclerotic disease involving the abdominal aorta with a chronic intimal flap or short segment dissection in the infrarenal abdominal aorta. Chronic occlusion of the left common iliac artery. Slowly enlarging retroperitoneal lymph nodes as described. An indolent neoplastic or lymphoproliferative process cannot be excluded      01/06/2016 Imaging    Ct abdomen and pelvis showed new extensive centrally necrotic retroperitoneal and bilateral pelvic lymphadenopathy. Differential includes lymphoproliferative condition (lymphoma/leukemia) or infectious lymphadenitis (such as due to bacterial, viral or mycobacterial etiologies). Squamous cell carcinoma metastases can have a similar appearance, although this is less likely given the location. Clinical and laboratory evaluation for a lymphoproliferative condition and tissue sampling is advised. 2. Aortic atherosclerosis. Stable 3.1 cm infrarenal abdominal aortic aneurysm      01/27/2016 Imaging    [facility] chest showed no acute cardiopulmonary disease. Known stable retrocrural and periaortic adenopathy. Oval density over the left neck base measuring 2.5 x 4.5 cm likely due partially to prominent internal jugular vein, although cannot exclude adenopathy in this  region.  Recommend neck CT with contrast. Three vessel atherosclerotic coronary artery disease. Few small thyroid nodules with the largest measuring 1.2 cm over the left lobe. 2.4 cm left renal cyst. Sub cm right renal hypodensity unchanged and too small to characterize but likely a cyst. Minimal cholelithiasis.      01/31/2016 Imaging    ECHO showed: Left ventricle: The cavity size was normal. Systolic function was normal. The estimated ejection fraction was in the range of 60% to 65%. Wall motion was normal; there were no regional wall motion abnormalities. There was an increased relative contribution of atrial contraction to ventricular filling. Doppler parameters are consistent with abnormal left ventricular relaxation (grade 1 diastolic dysfunction).      02/07/2016 Procedure    He had imaging guided biopsy of supraclavicular lymph node      02/07/2016 Pathology Results    Accession: [id number] Core biopsies reveal diffuse areas of large atypical lymphoid cells. There are multiple cells with very large irregular nuclei or multinucleated cells. Immunohistochemistry reveals the cells are positive for CD20, CD45, bcl-2, and bcl-6. They are negative for CD10, CD30, and ALK. CD3, CD4, CD5 and CD8 reveal scattered T-cells. Ki-67 reveals an elevated proliferation rate (95%). The cells are negative for cytokeratin 7, cytokeratin 20, TTF-1, CDX-2, HMB45, SOX11, and MelanA. Overall, these findings are consistent with a diffuse large B-cell lymphoma      02/12/2016 - 02/21/2016 Hospital Admission    He was admitted to the hospital with sepsis. He received cycle 1 of chemotherapy will hospitalized      02/14/2016 Imaging    Ct abdomen and pelvis showed widespread retrocrural, retroperitoneal, and bilateral pelvic lymphadenopathy, waxing/waning when compared to recent CT, as above. Mild right hydronephrosis, likely secondary to extrinsic compression by right retroperitoneal lymphadenopathy, with associated  diminished enhancement of the right kidney. Spleen is normal in size.      02/16/2016 Bone Marrow Biopsy    Bone marrow biopsy was negative for lymphoma involvement. Cytogenetics came back abnormal: -Y in 45% of cell lines      02/16/2016 Procedure    He had port placement      02/16/2016 - 02/21/2016 Chemotherapy    He received cycle 1 of R-EPOCH       03/08/2016 - 03/12/2016 Hospital Admission    The patient received cycle 2 of chemotherapy in the hospital      03/27/2016 PET scan    Significant decrease in size of abdominal and pelvic lymphadenopathy compared to previous CT, which shows low-grade metabolic activity. No evidence of active lymphoma within the neck or chest. 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 Continued annual follow-up by CT is recommended.       04/03/2016 - 04/07/2016 Hospital Admission    The patient received cycle 3 of chemotherapy in the hospital      04/20/2016 Procedure    He underwent successful intrathecal injection of chemotherapy without complication      5/57/3220 Pathology Results    [id number] CSF fluid negative for malignant cells      04/24/2016 - 04/28/2016 Hospital Admission    The patient received cycle 4 of chemotherapy in the hospital      05/15/2016 - 05/19/2016 Hospital Admission    He received cycle 5 of chemotherapy in the hospital      05/17/2016 Procedure    Successful intrathecal injection of methotrexate.      06/05/2016 - 06/09/2016 Hospital Admission    He is admitted to the hospital  for cycle 6 of chemotherapy      06/07/2016 Procedure    Successful intrathecal chemotherapy injection      06/27/2016 PET scan    Further reduction in size and metabolic activity of lower abdominal and pelvic lymph nodes indicating response to therapy. 2. 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 3. Other imaging findings of potential clinical significance: Mucous retention cyst in the left maxillary sinus. Right  mastoid effusion. Left renal cyst. Coronary, aortic arch, and branch vessel atherosclerotic vascular disease. Aortoiliac atherosclerotic vascular disease. Prominent stool throughout the colon favors constipation. Indirect left inguinal hernia containing adipose tissue. Cholelithiasis.      06/28/2016 Procedure    Intrathecal injection of chemotherapy without complication      8/36/6294 PET scan    1. Small LEFT external iliac lymph node is increased metabolic activity compared to prior (similar to liver activity ( [place] 3). No additional hypermetabolic adenopathy in the chest, abdomen, or pelvis. Recommend attention on follow-up. 2. Normal spleen and marrow.      12/19/2016 PET scan    1. No abnormal hypermetabolism in the neck, chest, abdomen or pelvis. 2. Aortic atherosclerosis (ICD10-170.0). Three-vessel coronary artery calcification. 3. Right common iliac artery aneurysm. 4. Cholelithiasis.      07/08/2017 Imaging    1. No definite findings to suggest recurrent lymphoma in the chest, abdomen or pelvis. 2. Spectrum of findings in the right lung, most evident in the right lower lobe, suggestive of very mild bronchopneumonia. 3. Aortic atherosclerosis, in addition to three-vessel coronary artery disease. Assessment for potential risk factor modification, dietary therapy or pharmacologic therapy may be warranted, if clinically indicated. 4. In addition, there is aneurysmal dilatation of the right common iliac artery which is larger than the prior study, currently measuring 3.0 x 2.8 cm. 5. Left inguinal hernia containing fat. 6. Additional incidental findings, as above.  Aortic Atherosclerosis (ICD10-I70.0).       REVIEW OF SYSTEMS:   Constitutional: Denies fevers, chills or abnormal weight loss Eyes: Denies blurriness of vision Ears, nose, mouth, throat, and face: Denies mucositis or sore throat Cardiovascular: Denies palpitation, chest discomfort Gastrointestinal:  Denies  nausea, heartburn or change in bowel habits Skin: Denies abnormal skin rashes Lymphatics: Denies new lymphadenopathy or easy bruising Neurological:Denies numbness, tingling or new weaknesses Behavioral/Psych: Mood is stable, no new changes  All other systems were reviewed with the patient and are negative.  I have reviewed the past medical history, past surgical history, social history and family history with the patient and they are unchanged from previous note.  ALLERGIES:  is allergic to bee venom; metformin and related; and sulfa drugs cross reactors.  MEDICATIONS:  Current Outpatient Medications  Medication Sig Dispense Refill  . amLODipine (NORVASC) 10 MG tablet Take 10 mg by mouth every morning.     Marland Kitchen aspirin EC 81 MG tablet Take 81 mg by mouth every morning.     Marland Kitchen atorvastatin (LIPITOR) 40 MG tablet Take 40 mg by mouth every evening.     . clopidogrel (PLAVIX) 75 MG tablet Take 75 mg by mouth daily.    . hydrochlorothiazide (HYDRODIURIL) 25 MG tablet Take 1 tablet (25 mg total) by mouth daily. 60 tablet 3  . hydrOXYzine (ATARAX/VISTARIL) 25 MG tablet Take 1 tablet (25 mg total) by mouth every 6 (six) hours as needed. Alternate with benadryl for itching 30 tablet 0  . lidocaine-prilocaine (EMLA) cream Apply 1 application topically as needed. (Patient taking differently: Apply 1 application topically  daily as needed (port access). ) 30 g 6  . pioglitazone (ACTOS) 30 MG tablet Take 30 mg by mouth every morning.      No current facility-administered medications for this visit.     PHYSICAL EXAMINATION: ECOG PERFORMANCE STATUS: 1 - Symptomatic but completely ambulatory  Vitals:   07/09/17 1333  BP: (!) 154/68  Pulse: 87  Resp: 18  Temp: 98.4 F (36.9 C)  SpO2: 99%   Filed Weights   07/09/17 1333  Weight: 229 lb 9.6 oz (104.1 kg)    GENERAL:alert, no distress and comfortable SKIN: skin color, texture, turgor are normal, no rashes or significant lesions EYES: normal,  Conjunctiva are pink and non-injected, sclera clear OROPHARYNX:no exudate, no erythema and lips, buccal mucosa, and tongue normal  NECK: supple, thyroid normal size, non-tender, without nodularity LYMPH:  no palpable lymphadenopathy in the cervical, axillary or inguinal LUNGS: Normal breathing effort.  Bilateral expiratory wheezes are noted  hEART: regular rate & rhythm and no murmurs read chronic mild bilateral lower extremity edema ABDOMEN:abdomen soft, non-tender and normal bowel sounds Musculoskeletal:no cyanosis of digits and no clubbing  NEURO: alert & oriented x 3 with fluent speech, no focal motor/sensory deficits  LABORATORY DATA:  I have reviewed the data as listed    Component Value Date/Time   NA 140 07/08/2017 0859   NA 141 12/18/2016 1403   K 5.1 07/08/2017 0859   K 4.4 12/18/2016 1403   CL 101 07/08/2017 0859   CO2 29 07/08/2017 0859   CO2 28 12/18/2016 1403   GLUCOSE 155 (H) 07/08/2017 0859   GLUCOSE 125 12/18/2016 1403   BUN 14 07/08/2017 0859   BUN 19.3 12/18/2016 1403   CREATININE 0.89 07/08/2017 0859   CREATININE 1.0 12/18/2016 1403   CALCIUM 9.7 07/08/2017 0859   CALCIUM 9.6 12/18/2016 1403   PROT 6.8 07/08/2017 0859   PROT 6.4 12/18/2016 1403   ALBUMIN 4.1 07/08/2017 0859   ALBUMIN 3.9 12/18/2016 1403   AST 18 07/08/2017 0859   AST 16 12/18/2016 1403   ALT 22 07/08/2017 0859   ALT 16 12/18/2016 1403   ALKPHOS 62 07/08/2017 0859   ALKPHOS 66 12/18/2016 1403   BILITOT 0.6 07/08/2017 0859   BILITOT 0.34 12/18/2016 1403   GFRNONAA >60 07/08/2017 0859   GFRAA >60 07/08/2017 0859    No results found for: SPEP, UPEP  Lab Results  Component Value Date   WBC 6.9 07/08/2017   NEUTROABS 3.6 07/08/2017   HGB 15.3 07/08/2017   HCT 45.5 07/08/2017   MCV 93.1 07/08/2017   PLT 177 07/08/2017      Chemistry      Component Value Date/Time   NA 140 07/08/2017 0859   NA 141 12/18/2016 1403   K 5.1 07/08/2017 0859   K 4.4 12/18/2016 1403   CL 101  07/08/2017 0859   CO2 29 07/08/2017 0859   CO2 28 12/18/2016 1403   BUN 14 07/08/2017 0859   BUN 19.3 12/18/2016 1403   CREATININE 0.89 07/08/2017 0859   CREATININE 1.0 12/18/2016 1403      Component Value Date/Time   CALCIUM 9.7 07/08/2017 0859   CALCIUM 9.6 12/18/2016 1403   ALKPHOS 62 07/08/2017 0859   ALKPHOS 66 12/18/2016 1403   AST 18 07/08/2017 0859   AST 16 12/18/2016 1403   ALT 22 07/08/2017 0859   ALT 16 12/18/2016 1403   BILITOT 0.6 07/08/2017 0859   BILITOT 0.34 12/18/2016 1403       RADIOGRAPHIC  STUDIES: I have reviewed the imaging study with the patient I have personally reviewed the radiological images as listed and agreed with the findings in the report. Ct Chest W Contrast  Result Date: 07/08/2017 CLINICAL DATA:  72 year old male with history of large B-cell lymphoma diagnosed in November 2017. Chemotherapy completed in January 2019. EXAM: CT CHEST, ABDOMEN AND PELVIS WITHOUT CONTRAST TECHNIQUE: Multidetector CT imaging of the chest, abdomen and pelvis was performed following the standard protocol without IV contrast. COMPARISON:  PET-CT 12/09/2017. CT the abdomen and pelvis 02/14/2016. Chest CT 01/27/2016. FINDINGS: CT CHEST FINDINGS Cardiovascular: Heart size is normal. There is no significant pericardial fluid, thickening or pericardial calcification. There is aortic atherosclerosis, as well as atherosclerosis of the great vessels of the mediastinum and the coronary arteries, including calcified atherosclerotic plaque in the left main, left anterior descending, left circumflex and right coronary arteries. Right internal jugular single-lumen porta cath with tip terminating in the superior aspect of the right atrium. Mediastinum/Nodes: No pathologically enlarged mediastinal or hilar lymph nodes. Esophagus is unremarkable in appearance. 1.4 cm low-attenuation nodule in the left lobe of the thyroid gland and 1.0 cm low-attenuation nodule in the isthmus of the thyroid gland,  both similar to prior examinations and nonspecific. No axillary lymphadenopathy. Lungs/Pleura: No large suspicious appearing pulmonary nodules or masses are noted. However, there are innumerable tiny micronodules scattered throughout the right lung, predominantly in the periphery of the right lower lobe in a peribronchovascular distribution where there is also some very subtle peribronchovascular ground-glass attenuation, presumably infectious or inflammatory in etiology. No confluent consolidative airspace disease. No pleural effusions. 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 No aggressive appearing lytic or blastic lesions are noted in the visualized portions of the skeleton. CT ABDOMEN PELVIS FINDINGS Hepatobiliary: No definite cystic or solid hepatic lesions. No intra or extrahepatic biliary ductal dilatation. Gallbladder is normal in appearance. Pancreas: No pancreatic mass. No pancreatic ductal dilatation. No pancreatic or peripancreatic fluid or inflammatory changes. Spleen: Normal in size and unremarkable in appearance. Adrenals/Urinary Tract: 2.9 cm exophytic simple cyst in the upper pole the left kidney, similar to the prior examination. A few other subcentimeter low-attenuation lesions in both kidneys are too small to definitively characterize, but are statistically likely to represent cysts. No hydroureteronephrosis. Urinary bladder is normal in appearance. Bilateral adrenal glands are normal in appearance. Stomach/Bowel: Normal appearance of the stomach. No pathologic dilatation of small bowel or colon. The appendix is not confidently identified and may be surgically absent. Regardless, there are no inflammatory changes noted adjacent to the cecum to suggest the presence of an acute appendicitis at this time. Vascular/Lymphatic: Aortic atherosclerosis with ectasia of the infrarenal abdominal  aorta which measures up to 2.6 x 2.4 cm. There is also increasing aneurysmal dilatation of the right common iliac artery which measures up to 3.0 x 2.9 cm and has a large burden of nonenhancing material which may represent mural thrombus and/or atheromatous plaque. No lymphadenopathy noted in the abdomen or pelvis. Reproductive: Prostate gland and seminal vesicles are unremarkable in appearance. Other: No significant volume of ascites. No pneumoperitoneum. Left inguinal hernia containing only fat. Musculoskeletal: No aggressive appearing lytic or blastic lesions are noted in the visualized portions of the skeleton. IMPRESSION: 1. No definite findings to suggest recurrent lymphoma in the chest, abdomen or pelvis. 2. Spectrum of findings in the right lung, most evident in the right lower lobe, suggestive of very  mild bronchopneumonia. 3. Aortic atherosclerosis, in addition to three-vessel coronary artery disease. Assessment for potential risk factor modification, dietary therapy or pharmacologic therapy may be warranted, if clinically indicated. 4. In addition, there is aneurysmal dilatation of the right common iliac artery which is larger than the prior study, currently measuring 3.0 x 2.8 cm. 5. Left inguinal hernia containing fat. 6. Additional incidental findings, as above. Aortic Atherosclerosis (ICD10-I70.0). Electronically Signed   By: Vinnie Langton M.D.   On: 07/08/2017 12:44   Ct Abdomen Pelvis W Contrast  Result Date: 07/08/2017 CLINICAL DATA:  72 year old male with history of large B-cell lymphoma diagnosed in November 2017. Chemotherapy completed in January 2019. EXAM: CT CHEST, ABDOMEN AND PELVIS WITHOUT CONTRAST TECHNIQUE: Multidetector CT imaging of the chest, abdomen and pelvis was performed following the standard protocol without IV contrast. COMPARISON:  PET-CT 12/09/2017. CT the abdomen and pelvis 02/14/2016. Chest CT 01/27/2016. FINDINGS: CT CHEST FINDINGS Cardiovascular: Heart size is  normal. There is no significant pericardial fluid, thickening or pericardial calcification. There is aortic atherosclerosis, as well as atherosclerosis of the great vessels of the mediastinum and the coronary arteries, including calcified atherosclerotic plaque in the left main, left anterior descending, left circumflex and right coronary arteries. Right internal jugular single-lumen porta cath with tip terminating in the superior aspect of the right atrium. Mediastinum/Nodes: No pathologically enlarged mediastinal or hilar lymph nodes. Esophagus is unremarkable in appearance. 1.4 cm low-attenuation nodule in the left lobe of the thyroid gland and 1.0 cm low-attenuation nodule in the isthmus of the thyroid gland, both similar to prior examinations and nonspecific. No axillary lymphadenopathy. Lungs/Pleura: No large suspicious appearing pulmonary nodules or masses are noted. However, there are innumerable tiny micronodules scattered throughout the right lung, predominantly in the periphery of the right lower lobe in a peribronchovascular distribution where there is also some very subtle peribronchovascular ground-glass attenuation, presumably infectious or inflammatory in etiology. No confluent consolidative airspace disease. No pleural effusions. 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 No aggressive appearing lytic or blastic lesions are noted in the visualized portions of the skeleton. CT ABDOMEN PELVIS FINDINGS Hepatobiliary: No definite cystic or solid hepatic lesions. No intra or extrahepatic biliary ductal dilatation. Gallbladder is normal in appearance. Pancreas: No pancreatic mass. No pancreatic ductal dilatation. No pancreatic or peripancreatic fluid or inflammatory changes. Spleen: Normal in size and unremarkable in appearance. Adrenals/Urinary Tract: 2.9 cm exophytic simple cyst in the upper  pole the left kidney, similar to the prior examination. A few other subcentimeter low-attenuation lesions in both kidneys are too small to definitively characterize, but are statistically likely to represent cysts. No hydroureteronephrosis. Urinary bladder is normal in appearance. Bilateral adrenal glands are normal in appearance. Stomach/Bowel: Normal appearance of the stomach. No pathologic dilatation of small bowel or colon. The appendix is not confidently identified and may be surgically absent. Regardless, there are no inflammatory changes noted adjacent to the cecum to suggest the presence of an acute appendicitis at this time. Vascular/Lymphatic: Aortic atherosclerosis with ectasia of the infrarenal abdominal aorta which measures up to 2.6 x 2.4 cm. There is also increasing aneurysmal dilatation of the right common iliac artery which measures up to 3.0 x 2.9 cm and has a large burden of nonenhancing material which may represent mural thrombus and/or atheromatous plaque. No lymphadenopathy noted in the abdomen or pelvis. Reproductive: Prostate gland and seminal vesicles are unremarkable in appearance. Other: No significant volume  of ascites. No pneumoperitoneum. Left inguinal hernia containing only fat. Musculoskeletal: No aggressive appearing lytic or blastic lesions are noted in the visualized portions of the skeleton. IMPRESSION: 1. No definite findings to suggest recurrent lymphoma in the chest, abdomen or pelvis. 2. Spectrum of findings in the right lung, most evident in the right lower lobe, suggestive of very mild bronchopneumonia. 3. Aortic atherosclerosis, in addition to three-vessel coronary artery disease. Assessment for potential risk factor modification, dietary therapy or pharmacologic therapy may be warranted, if clinically indicated. 4. In addition, there is aneurysmal dilatation of the right common iliac artery which is larger than the prior study, currently measuring 3.0 x 2.8 cm. 5. Left  inguinal hernia containing fat. 6. Additional incidental findings, as above. Aortic Atherosclerosis (ICD10-I70.0). Electronically Signed   By: Vinnie Langton M.D.   On: 07/08/2017 12:44    All questions were answered. The patient knows to call the clinic with any problems, questions or concerns. No barriers to learning was detected.  I spent 25 minutes counseling the patient face to face. The total time spent in the appointment was 30 minutes and more than 50% was on counseling and review of test results  Heath Lark, MD 07/09/2017 1:55 PM

## 2017-07-09 NOTE — Assessment & Plan Note (Signed)
The patient has significant peripheral vascular disease His fasting blood sugar is high We discussed risk factor modification and dietary change The patient appears motivated to lose weight

## 2017-07-09 NOTE — Assessment & Plan Note (Signed)
This is chronic and stable Continue medical management

## 2017-07-09 NOTE — Assessment & Plan Note (Signed)
He has intermittent elevated blood pressure could be due to component of whitecoat hypertension He will continue his current prescription blood pressure medicine as directed

## 2017-07-09 NOTE — Assessment & Plan Note (Signed)
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

## 2017-07-09 NOTE — Telephone Encounter (Signed)
Gave avs and calendar ° °

## 2017-07-09 NOTE — Assessment & Plan Note (Signed)
He has mild COPD with expiratory wheezes CT scan show mild abnormality but he has no signs or symptoms to suggest infection I recommend conservative management with over-the-counter antihistamine for component of allergies His blood counts show mild eosinophilia

## 2017-07-09 NOTE — Assessment & Plan Note (Signed)
CT scan showed no evidence of lymphoma recurrence I plan to see him back in 6 months with repeat history, physical examination and blood work only We will also schedule port flushes to maintain port patency for at least 2 years since discontinuation of treatment

## 2017-07-29 ENCOUNTER — Other Ambulatory Visit: Payer: Self-pay

## 2017-07-29 ENCOUNTER — Inpatient Hospital Stay: Payer: Medicare Other

## 2017-07-29 DIAGNOSIS — I723 Aneurysm of iliac artery: Secondary | ICD-10-CM

## 2017-07-29 DIAGNOSIS — I739 Peripheral vascular disease, unspecified: Secondary | ICD-10-CM

## 2017-07-29 DIAGNOSIS — R600 Localized edema: Secondary | ICD-10-CM | POA: Diagnosis not present

## 2017-07-29 DIAGNOSIS — C8338 Diffuse large B-cell lymphoma, lymph nodes of multiple sites: Secondary | ICD-10-CM | POA: Diagnosis not present

## 2017-07-29 DIAGNOSIS — Z8673 Personal history of transient ischemic attack (TIA), and cerebral infarction without residual deficits: Secondary | ICD-10-CM

## 2017-07-29 DIAGNOSIS — Z9221 Personal history of antineoplastic chemotherapy: Secondary | ICD-10-CM | POA: Diagnosis not present

## 2017-07-29 DIAGNOSIS — I6529 Occlusion and stenosis of unspecified carotid artery: Secondary | ICD-10-CM

## 2017-07-29 DIAGNOSIS — Z794 Long term (current) use of insulin: Secondary | ICD-10-CM | POA: Diagnosis not present

## 2017-07-29 DIAGNOSIS — E1165 Type 2 diabetes mellitus with hyperglycemia: Secondary | ICD-10-CM | POA: Diagnosis not present

## 2017-07-29 DIAGNOSIS — Z95828 Presence of other vascular implants and grafts: Secondary | ICD-10-CM

## 2017-07-29 MED ORDER — SODIUM CHLORIDE 0.9% FLUSH
10.0000 mL | Freq: Once | INTRAVENOUS | Status: AC
  Administered 2017-07-29: 10 mL
  Filled 2017-07-29: qty 10

## 2017-07-29 MED ORDER — HEPARIN SOD (PORK) LOCK FLUSH 100 UNIT/ML IV SOLN
500.0000 [IU] | Freq: Once | INTRAVENOUS | Status: AC
  Administered 2017-07-29: 500 [IU]
  Filled 2017-07-29: qty 5

## 2017-07-30 ENCOUNTER — Other Ambulatory Visit: Payer: Self-pay

## 2017-07-30 DIAGNOSIS — I739 Peripheral vascular disease, unspecified: Secondary | ICD-10-CM

## 2017-07-30 DIAGNOSIS — I6529 Occlusion and stenosis of unspecified carotid artery: Secondary | ICD-10-CM

## 2017-07-30 DIAGNOSIS — I723 Aneurysm of iliac artery: Secondary | ICD-10-CM

## 2017-07-30 DIAGNOSIS — Z8673 Personal history of transient ischemic attack (TIA), and cerebral infarction without residual deficits: Secondary | ICD-10-CM

## 2017-08-06 IMAGING — XA IR US GUIDE VASC ACCESS RIGHT
1 series · 1 of 1 positions shown · non-contrast
Comparison: none

CLINICAL DATA: Large B-cell lymphoma and need for porta cath to
begin chemotherapy.

[Series 300: line placements · 1 of 1 slices shown]
[im 1/1]
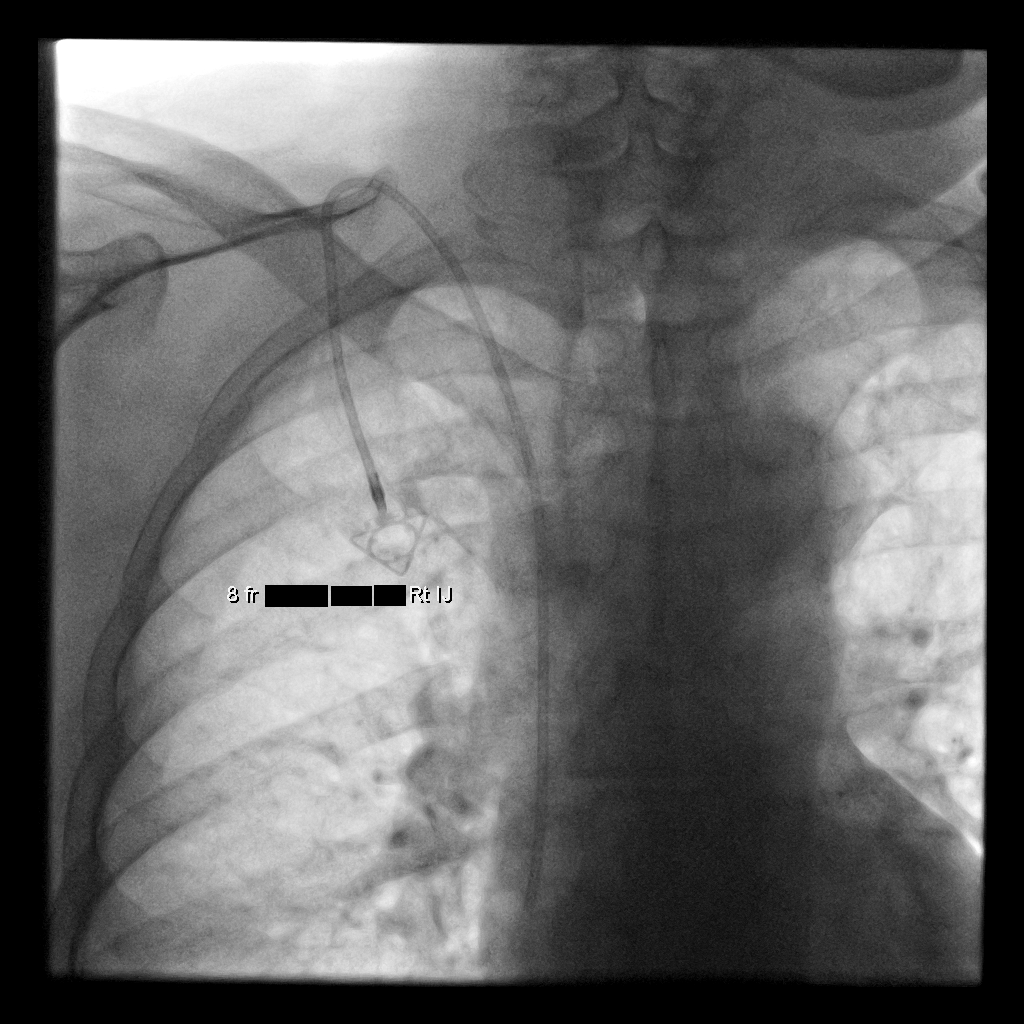

[1 of 1 positions shown; findings below may reference images not displayed]

EXAM:
IMPLANTED PORT A CATH PLACEMENT WITH ULTRASOUND AND FLUOROSCOPIC
GUIDANCE

ANESTHESIA/SEDATION:
5.0 mg IV Versed; 100 mcg IV Fentanyl

Total Moderate Sedation Time:  30 minutes

The patient's level of consciousness and physiologic status were
continuously monitored during the procedure by Radiology nursing.

Additional Medications: 2 g IV Ancef. As antibiotic prophylaxis,
Ancef was ordered pre-procedure and administered intravenously
within one hour of incision..

FLUOROSCOPY TIME:  12 seconds.  4.0 mGy.

PROCEDURE:
The procedure, risks, benefits, and alternatives were explained to
the patient. Questions regarding the procedure were encouraged and
answered. The patient understands and consents to the procedure. A
time-out was performed prior to initiating the procedure.

Ultrasound was utilized to confirm patency of the right internal
jugular vein. The right neck and chest were prepped with
chlorhexidine in a sterile fashion, and a sterile drape was applied
covering the operative field. Maximum barrier sterile technique with
sterile gowns and gloves were used for the procedure. Local
anesthesia was provided with 1% lidocaine.

After creating a small venotomy incision, a 21 gauge needle was
advanced into the right internal jugular vein under direct,
real-time ultrasound guidance. Ultrasound image documentation was
performed. After securing guidewire access, an 8 Fr dilator was
placed. A J-wire was kinked to measure appropriate catheter length.

A subcutaneous port pocket was then created along the upper chest
wall utilizing sharp and blunt dissection. Portable cautery was
utilized. The pocket was irrigated with sterile saline.

A single lumen power injectable port was chosen for placement. The 8
Fr catheter was tunneled from the port pocket site to the venotomy
incision. The port was placed in the pocket. External catheter was
trimmed to appropriate length based on guidewire measurement.

At the venotomy, an 8 Fr peel-away sheath was placed over a
guidewire. The catheter was then placed through the sheath and the
sheath removed. Final catheter positioning was confirmed and
documented with a fluoroscopic spot image. The port was accessed
with a needle and aspirated and flushed with heparinized saline. The
access needle was removed.

The venotomy and port pocket incisions were closed with subcutaneous
3-0 Monocryl and subcuticular 4-0 Vicryl. Dermabond was applied to
both incisions.

COMPLICATIONS:
COMPLICATIONS
None
FINDINGS: After catheter placement, the tip lies at the [place] junction.
The catheter aspirates normally and is ready for immediate use.
IMPRESSION: Placement of single lumen port a cath via right internal jugular
vein. The catheter tip lies at the [place] junction. A power
injectable port a cath was placed and is ready for immediate use.

## 2017-08-14 DIAGNOSIS — I700 Atherosclerosis of aorta: Secondary | ICD-10-CM | POA: Diagnosis not present

## 2017-08-14 DIAGNOSIS — I10 Essential (primary) hypertension: Secondary | ICD-10-CM | POA: Diagnosis not present

## 2017-08-14 DIAGNOSIS — I723 Aneurysm of iliac artery: Secondary | ICD-10-CM | POA: Diagnosis not present

## 2017-08-14 DIAGNOSIS — I739 Peripheral vascular disease, unspecified: Secondary | ICD-10-CM | POA: Diagnosis not present

## 2017-08-14 DIAGNOSIS — I639 Cerebral infarction, unspecified: Secondary | ICD-10-CM | POA: Diagnosis not present

## 2017-08-14 DIAGNOSIS — C8510 Unspecified B-cell lymphoma, unspecified site: Secondary | ICD-10-CM | POA: Diagnosis not present

## 2017-08-14 DIAGNOSIS — E1151 Type 2 diabetes mellitus with diabetic peripheral angiopathy without gangrene: Secondary | ICD-10-CM | POA: Diagnosis not present

## 2017-08-14 DIAGNOSIS — E7800 Pure hypercholesterolemia, unspecified: Secondary | ICD-10-CM | POA: Diagnosis not present

## 2017-08-14 DIAGNOSIS — J309 Allergic rhinitis, unspecified: Secondary | ICD-10-CM | POA: Diagnosis not present

## 2017-08-28 ENCOUNTER — Ambulatory Visit (INDEPENDENT_AMBULATORY_CARE_PROVIDER_SITE_OTHER)
Admission: RE | Admit: 2017-08-28 | Discharge: 2017-08-28 | Disposition: A | Discharge status: Home / Self Care | Payer: Medicare Other | Source: Ambulatory Visit | Attending: Vascular Surgery | Admitting: Vascular Surgery

## 2017-08-28 ENCOUNTER — Ambulatory Visit (HOSPITAL_COMMUNITY)
Admission: RE | Admit: 2017-08-28 | Discharge: 2017-08-28 | Disposition: A | Discharge status: Home / Self Care | Payer: Medicare Other | Source: Ambulatory Visit | Attending: Vascular Surgery | Admitting: Vascular Surgery

## 2017-08-28 ENCOUNTER — Encounter: Payer: Self-pay | Admitting: Physician Assistant

## 2017-08-28 ENCOUNTER — Ambulatory Visit (INDEPENDENT_AMBULATORY_CARE_PROVIDER_SITE_OTHER): Payer: Medicare Other | Admitting: Physician Assistant

## 2017-08-28 VITALS — BP 137/75 | HR 91 | Temp 97.8°F | Resp 20 | Ht 70.0 in | Wt 224.0 lb

## 2017-08-28 DIAGNOSIS — I739 Peripheral vascular disease, unspecified: Secondary | ICD-10-CM | POA: Diagnosis not present

## 2017-08-28 DIAGNOSIS — I10 Essential (primary) hypertension: Secondary | ICD-10-CM | POA: Insufficient documentation

## 2017-08-28 DIAGNOSIS — Z8673 Personal history of transient ischemic attack (TIA), and cerebral infarction without residual deficits: Secondary | ICD-10-CM | POA: Diagnosis not present

## 2017-08-28 DIAGNOSIS — E1151 Type 2 diabetes mellitus with diabetic peripheral angiopathy without gangrene: Secondary | ICD-10-CM | POA: Diagnosis not present

## 2017-08-28 DIAGNOSIS — I6523 Occlusion and stenosis of bilateral carotid arteries: Secondary | ICD-10-CM

## 2017-08-28 DIAGNOSIS — R9389 Abnormal findings on diagnostic imaging of other specified body structures: Secondary | ICD-10-CM | POA: Diagnosis not present

## 2017-08-28 DIAGNOSIS — I6529 Occlusion and stenosis of unspecified carotid artery: Secondary | ICD-10-CM | POA: Diagnosis not present

## 2017-08-28 DIAGNOSIS — I723 Aneurysm of iliac artery: Secondary | ICD-10-CM | POA: Diagnosis not present

## 2017-08-28 DIAGNOSIS — Z87891 Personal history of nicotine dependence: Secondary | ICD-10-CM | POA: Diagnosis not present

## 2017-08-28 NOTE — Progress Notes (Signed)
Established Intermittent Claudication/ Carotid artery stenosis/ iliac aneurysm   History of Present Illness   Samuel Berger is a 72 y.o. (29-Oct-1945) male who presents to go over vascular studies.  He is followed for carotid artery stenosis with history of right carotid endarterectomy in November 2013 by Dr. Kellie Simmering.  He denies any strokelike symptoms including slurring speech, changes in vision, or one-sided weakness.  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  He continues to deny claudication, rest pain, or active tissue ischemia of bilateral lower extremities.  Patient states he is active and likes to walk for exercise.  He is taking an aspirin, Plavix, and a statin daily.  Patient is also followed for known right common iliac artery aneurysm.  Patient had a CT of abdomen and pelvis with contrast on 8 April which demonstrated an increase in size from 2.8 to 3 cm right common iliac artery aneurysm.  Infrarenal abdominal aortic aneurysm 2.6 cm.  Patient denies any new or changing abdominal or back pain.  He completed his chemotherapy for B-cell lymphoma in January of this year and has no sign of recurrence per patient.  Several weeks ago patient came in person to reschedule his appointment to see a surgeon.  He was told today that he was on the PA clinic schedule.  During today's visit patient is expressing his concern and is upset about being on the PA clinic and the fact that he will have to return again to see an MD another day.  The patient's PMH, PSH, SH, and FamHx were reviewed on today's visit and are unchanged from prior visit.  Current Outpatient Medications  Medication Sig Dispense Refill  . amLODipine (NORVASC) 10 MG tablet Take 10 mg by mouth every morning.     Marland Kitchen aspirin EC 81 MG tablet Take 81 mg by mouth every morning.     Marland Kitchen atorvastatin (LIPITOR) 40 MG tablet Take 40 mg by mouth every evening.     . Cholecalciferol (VITAMIN D3) 25 MCG TABS Take 50  mcg by mouth.    . clopidogrel (PLAVIX) 75 MG tablet Take 75 mg by mouth daily.    Marland Kitchen lidocaine-prilocaine (EMLA) cream Apply 1 application topically as needed. (Patient taking differently: Apply 1 application topically daily as needed (port access). ) 30 g 6  . hydrochlorothiazide (HYDRODIURIL) 25 MG tablet Take 1 tablet (25 mg total) by mouth daily. (Patient not taking: Reported on 08/28/2017) 60 tablet 3  . hydrOXYzine (ATARAX/VISTARIL) 25 MG tablet Take 1 tablet (25 mg total) by mouth every 6 (six) hours as needed. Alternate with benadryl for itching (Patient not taking: Reported on 08/28/2017) 30 tablet 0  . pioglitazone (ACTOS) 30 MG tablet Take 30 mg by mouth every morning.      No current facility-administered medications for this visit.     On ROS today: 10 system ROS is negative unless otherwise noted in HPI  Physical Examination   Vitals:   08/28/17 1505 08/28/17 1512  BP: (!) 144/72 137/75  Pulse: 91   Resp: 20   Temp: 97.8 F (36.6 C)   TempSrc: Oral   SpO2: 93%   Weight: 224 lb (101.6 kg)   Height: 5\' 10"  (1.778 m)    Body mass index is 32.14 kg/m.  General Alert, O x 3, WD, NAD  Pulmonary Sym exp, good B air movt,   Cardiac RRR, Nl S1, S2,   Vascular Vessel Right Left  Radial Palpable Palpable  Brachial Palpable Palpable  Carotid Palpable, No Bruit Palpable, No Bruit  Aorta Not palpable N/A  Femoral Palpable Palpable  Popliteal Not palpable Not palpable  PT Not palpable Not palpable  DP Not palpable Not palpable    Gastro- intestinal soft, non-distended, non-tender to palpation,   Musculo- skeletal M/S 5/5 throughout  , Extremities without ischemic changes  , Pitting edema present: To the level of the proximal ankle bilaterally, No visible varicosities , No Lipodermatosclerosis present  Neurologic Pain and light touch intact in extremities , Motor exam as listed above    Non-Invasive Vascular imaging   ABI   R:   ABI: 0.55,   PT: mono  DP:  mono  TBI: 0.39  L:   ABI: 0.58,   PT: mono  DP: mono  TBI: 0.32   Carotid duplex demonstrates a widely patent right carotid endarterectomy site without evidence of restenosis.  Left ICA 1 to 39% stenosis  CT abdomen pelvis IMPRESSION: 1. No definite findings to suggest recurrent lymphoma in the chest, abdomen or pelvis. 2. Spectrum of findings in the right lung, most evident in the right lower lobe, suggestive of very mild bronchopneumonia. 3. Aortic atherosclerosis, in addition to three-vessel coronary artery disease. Assessment for potential risk factor modification, dietary therapy or pharmacologic therapy may be warranted, if clinically indicated. 4. In addition, there is aneurysmal dilatation of the right common iliac artery which is larger than the prior study, currently measuring 3.0 x 2.8 cm. 5. Left inguinal hernia containing fat. 6. Additional incidental findings, as above.  Aortic Atherosclerosis (ICD10-I70.0).   Electronically Signed   By: Vinnie Langton M.D.   On: 07/08/2017 12:44    Medical Decision Making   [name] is a 72 y.o. male who presents to go over imaging studies as he is followed for carotid artery stenosis, right common iliac artery aneurysm, and PAD with known occlusion of left iliac artery   Carotid duplex demonstrates widely patent surgical site in right neck and only mild stenosis of left ICA; recommended repeat carotid duplex in 2 years  ABIs are unchanged and patient denies claudication symptoms despite left iliac artery occlusion; no indication for revascularization at this time as patient is without symptoms or tissue changes  Right common iliac artery now measuring 3 cm by CT with contrast  Patient will return at his convenience to discuss conservative management versus repair of right iliac artery with next available vascular surgeon  Dagoberto Ligas, PA-C [facility] [place]:  [phone] Pager: [phone]

## 2017-09-09 ENCOUNTER — Inpatient Hospital Stay: Payer: Medicare Other | Attending: Hematology and Oncology

## 2017-09-09 DIAGNOSIS — C8338 Diffuse large B-cell lymphoma, lymph nodes of multiple sites: Secondary | ICD-10-CM

## 2017-09-09 DIAGNOSIS — Z95828 Presence of other vascular implants and grafts: Secondary | ICD-10-CM

## 2017-09-09 MED ORDER — HEPARIN SOD (PORK) LOCK FLUSH 100 UNIT/ML IV SOLN
250.0000 [IU] | Freq: Once | INTRAVENOUS | Status: AC
  Administered 2017-09-09: 250 [IU]
  Filled 2017-09-09: qty 5

## 2017-09-09 MED ORDER — SODIUM CHLORIDE 0.9% FLUSH
10.0000 mL | Freq: Once | INTRAVENOUS | Status: AC
  Administered 2017-09-09: 10 mL
  Filled 2017-09-09: qty 10

## 2017-09-15 IMAGING — CT NM PET TUM IMG INITIAL (PI) SKULL BASE T - THIGH
1 of 8 series · 1 of 25 positions shown · non-contrast
Comparison: CT on 02/14/2016

CLINICAL DATA: Initial treatment strategy for diffuse large B-cell
lymphoma. Status post chemotherapy. Evaluate treatment response.

EXAM:
NUCLEAR MEDICINE PET SKULL BASE TO THIGH
TECHNIQUE: 9.1 mCi F-18 FDG was injected intravenously. Full-ring PET imaging
was performed from the skull base to thigh after the radiotracer. CT
data was obtained and used for attenuation correction and anatomic
localization.
FASTING BLOOD GLUCOSE:  Value: 123 mg/dl

[Series 4: ct sk_thigh 5.0 b31f · axial · 5.0mm · 0.92mm/px · 1 of 235 slices shown]
[im 235/235  brain]
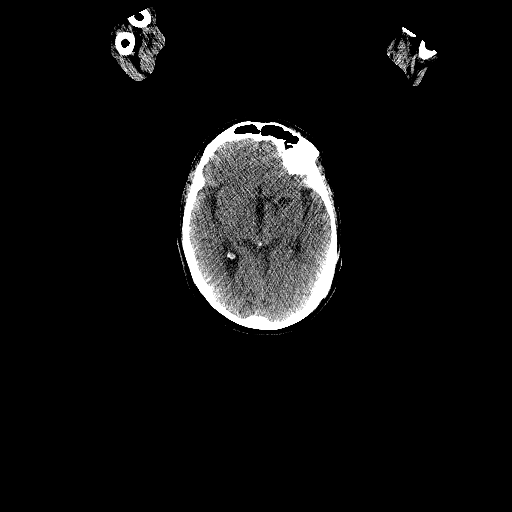

[1 of 25 positions shown; findings below may reference images not displayed]

FINDINGS: NECK

No hypermetabolic lymph nodes in the neck.

CHEST

No hypermetabolic mediastinal or hilar nodes. No suspicious
pulmonary nodules on the CT scan.

ABDOMEN/PELVIS

No abnormal hypermetabolic activity within the liver, pancreas,
adrenal glands, or spleen.

Significant decrease in abdominal and pelvic lymphadenopathy is
demonstrated since previous study. Index retroperitoneal lymph node
in the aortocaval space currently measures 11 mm on image 147/for
compared to 1.6 cm previously. This has an SUV max of 3.3.

Index centrally necrotic right common iliac lymphadenopathy 2.4 cm
on image 162/4 compared to 3.3 cm previously. This has an SUV max of
4.7.

Index lymph node in left obturator/external iliac region currently
measures 1.1 cm on image 183/4 compared to 2.9 cm previously. This
has SUV max of 2.3. No new lymphadenopathy identified.

Probable tiny calcified gallstones again noted, without evidence
cholecystitis. Right hydronephrosis has resolved since previous
study. Stable small left upper pole renal cyst. Stable 3.1 cm
aneurysm of right common iliac artery. Stable small fat containing
left inguinal hernia .

SKELETON

No focal hypermetabolic activity to suggest skeletal metastasis.
Diffusely increased bone marrow FDG uptake is seen, consistent with
treatment response.
IMPRESSION: Significant decrease in size of abdominal and pelvic lymphadenopathy
compared to previous CT, which shows low-grade metabolic activity.

No evidence of active lymphoma within the neck or chest.

Stable 3.1 cm right common iliac artery aneurysm. Continued annual
follow-up by CT is recommended. This recommendation follows ACR
consensus guidelines: White Paper of the ACR Incidental Findings

## 2017-09-25 ENCOUNTER — Encounter: Payer: Self-pay | Admitting: Vascular Surgery

## 2017-09-25 ENCOUNTER — Other Ambulatory Visit: Payer: Self-pay

## 2017-09-25 ENCOUNTER — Ambulatory Visit (INDEPENDENT_AMBULATORY_CARE_PROVIDER_SITE_OTHER): Payer: Medicare Other | Admitting: Vascular Surgery

## 2017-09-25 VITALS — BP 136/75 | HR 90 | Resp 20 | Ht 70.0 in | Wt 224.0 lb

## 2017-09-25 DIAGNOSIS — I6523 Occlusion and stenosis of bilateral carotid arteries: Secondary | ICD-10-CM

## 2017-09-25 DIAGNOSIS — I723 Aneurysm of iliac artery: Secondary | ICD-10-CM | POA: Diagnosis not present

## 2017-09-25 NOTE — Progress Notes (Signed)
Patient name: Samuel Berger MRN: [id number] DOB: 03/09/1946 Sex: male  REASON FOR VISIT:   To discuss right common iliac artery aneurysm  HPI:   ZAIDYN CLAIRE is a pleasant 72 y.o. male who was seen by the physician's assistant on 08/28/2017.  The patient had a right carotid endarterectomy in November 2013 by Dr. Kellie Simmering.  Patient has a known left iliac artery occlusion and a right common iliac artery aneurysm.  At the time of the last visit carotid duplex scan showed that the right carotid endarterectomy site was widely patent.  There is minimal disease on the left side.  A follow-up duplex scan was recommended in 2 years.  The right common iliac artery aneurysm had increased slightly in size to 3 cm and therefore the patient was set up to discuss the options here.  Patient denies abdominal or back pain.  He denies any history of stroke, TIAs, expressive or receptive aphasia, or amaurosis fugax.  He denies claudication or rest pain.  He denies any history of nonhealing ulcers of his feet.  Current Outpatient Medications  Medication Sig Dispense Refill  . amLODipine (NORVASC) 10 MG tablet Take 10 mg by mouth every morning.     Marland Kitchen atorvastatin (LIPITOR) 40 MG tablet Take 40 mg by mouth every evening.     . Cholecalciferol (VITAMIN D3) 25 MCG TABS Take 50 mcg by mouth.    . clopidogrel (PLAVIX) 75 MG tablet Take 75 mg by mouth daily.    Marland Kitchen lidocaine-prilocaine (EMLA) cream Apply 1 application topically as needed. (Patient taking differently: Apply 1 application topically daily as needed (port access). ) 30 g 6  . aspirin EC 81 MG tablet Take 81 mg by mouth every morning.     . hydrochlorothiazide (HYDRODIURIL) 25 MG tablet Take 1 tablet (25 mg total) by mouth daily. (Patient not taking: Reported on 08/28/2017) 60 tablet 3  . hydrOXYzine (ATARAX/VISTARIL) 25 MG tablet Take 1 tablet (25 mg total) by mouth every 6 (six) hours as needed. Alternate with benadryl for itching (Patient not taking:  Reported on 08/28/2017) 30 tablet 0  . pioglitazone (ACTOS) 30 MG tablet Take 30 mg by mouth every morning.      No current facility-administered medications for this visit.     REVIEW OF SYSTEMS:  [X]  denotes positive finding, [ ]  denotes negative finding Cardiac  Comments:  Chest pain or chest pressure:    Shortness of breath upon exertion:    Short of breath when lying flat:    Irregular heart rhythm:    Constitutional    Fever or chills:     PHYSICAL EXAM:   Vitals:   09/25/17 0823  BP: 136/75  Pulse: 90  Resp: 20  SpO2: 96%  Weight: 224 lb (101.6 kg)  Height: 5\' 10"  (1.778 m)    GENERAL: The patient is a well-nourished male, in no acute distress. The vital signs are documented above. CARDIOVASCULAR: There is a regular rate and rhythm. PULMONARY: There is good air exchange bilaterally without wheezing or rales. VASCULAR: On the right side he has a palpable femoral pulse.  I cannot palpate pedal pulses. On the left side I cannot palpate a femoral pulse or pedal pulses.  DATA:   CT ABDOMEN PELVIS: This shows that the right common iliac artery aneurysm measures 3 cm in maximum diameter.  MEDICAL ISSUES:   3 CM RIGHT COMMON ILIAC ARTERY ANEURYSM: This patient has an asymptomatic 3 cm right common iliac artery aneurysm.  I have explained that we would not consider elective repair of an iliac aneurysm unless it reached at least 3.5 cm in diameter.  If this did enlarge she would require open repair given his severe iliac occlusive disease and significant calcific disease.  Of note, he receives CT scans every 6 months because of a history of  and therefore I will use this to follow his iliac artery aneurysm.    STATUS POST RIGHT CAROTID ENDARTERECTOMY: His most recent carotid duplex scan looked fine with no significant carotid disease on either side.  He was set up for a follow-up duplex scan in 2 years.  He is asymptomatic.  He is on aspirin, Plavix, and a statin.  PERIPHERAL  VASCULAR DISEASE: Patient does have evidence of infrainguinal arterial occlusive disease however he is asymptomatic.  He has follow-up ABIs scheduled in 1 year.  He is on aspirin and is on a statin.  Deitra Mayo [facility] [phone]

## 2017-10-21 ENCOUNTER — Inpatient Hospital Stay: Payer: Medicare Other | Attending: Hematology and Oncology

## 2017-10-21 DIAGNOSIS — Z452 Encounter for adjustment and management of vascular access device: Secondary | ICD-10-CM | POA: Diagnosis not present

## 2017-10-21 DIAGNOSIS — Z95828 Presence of other vascular implants and grafts: Secondary | ICD-10-CM

## 2017-10-21 DIAGNOSIS — C8338 Diffuse large B-cell lymphoma, lymph nodes of multiple sites: Secondary | ICD-10-CM | POA: Diagnosis not present

## 2017-10-21 MED ORDER — HEPARIN SOD (PORK) LOCK FLUSH 100 UNIT/ML IV SOLN
500.0000 [IU] | Freq: Once | INTRAVENOUS | Status: AC
  Administered 2017-10-21: 500 [IU]
  Filled 2017-10-21: qty 5

## 2017-10-21 MED ORDER — SODIUM CHLORIDE 0.9% FLUSH
10.0000 mL | Freq: Once | INTRAVENOUS | Status: AC
  Administered 2017-10-21: 10 mL
  Filled 2017-10-21: qty 10

## 2017-10-21 NOTE — Patient Instructions (Signed)
Implanted Port Home Guide An implanted port is a type of central line that is placed under the skin. Central lines are used to provide IV access when treatment or nutrition needs to be given through a person's veins. Implanted ports are used for long-term IV access. An implanted port may be placed because:  You need IV medicine that would be irritating to the small veins in your hands or arms.  You need long-term IV medicines, such as antibiotics.  You need IV nutrition for a long period.  You need frequent blood draws for lab tests.  You need dialysis.  Implanted ports are usually placed in the chest area, but they can also be placed in the upper arm, the abdomen, or the leg. An implanted port has two main parts:  Reservoir. The reservoir is round and will appear as a small, raised area under your skin. The reservoir is the part where a needle is inserted to give medicines or draw blood.  Catheter. The catheter is a thin, flexible tube that extends from the reservoir. The catheter is placed into a large vein. Medicine that is inserted into the reservoir goes into the catheter and then into the vein.  How will I care for my incision site? Do not get the incision site wet. Bathe or shower as directed by your health care provider. How is my port accessed? Special steps must be taken to access the port:  Before the port is accessed, a numbing cream can be placed on the skin. This helps numb the skin over the port site.  Your health care provider uses a sterile technique to access the port. ? Your health care provider must put on a mask and sterile gloves. ? The skin over your port is cleaned carefully with an antiseptic and allowed to dry. ? The port is gently pinched between sterile gloves, and a needle is inserted into the port.  Only "non-coring" port needles should be used to access the port. Once the port is accessed, a blood return should be checked. This helps ensure that the port  is in the vein and is not clogged.  If your port needs to remain accessed for a constant infusion, a clear (transparent) bandage will be placed over the needle site. The bandage and needle will need to be changed every week, or as directed by your health care provider.  Keep the bandage covering the needle clean and dry. Do not get it wet. Follow your health care provider's instructions on how to take a shower or bath while the port is accessed.  If your port does not need to stay accessed, no bandage is needed over the port.  What is flushing? Flushing helps keep the port from getting clogged. Follow your health care provider's instructions on how and when to flush the port. Ports are usually flushed with saline solution or a medicine called heparin. The need for flushing will depend on how the port is used.  If the port is used for intermittent medicines or blood draws, the port will need to be flushed: ? After medicines have been given. ? After blood has been drawn. ? As part of routine maintenance.  If a constant infusion is running, the port may not need to be flushed.  How long will my port stay implanted? The port can stay in for as long as your health care provider thinks it is needed. When it is time for the port to come out, surgery will be   done to remove it. The procedure is similar to the one performed when the port was put in. When should I seek immediate medical care? When you have an implanted port, you should seek immediate medical care if:  You notice a bad smell coming from the incision site.  You have swelling, redness, or drainage at the incision site.  You have more swelling or pain at the port site or the surrounding area.  You have a fever that is not controlled with medicine.  This information is not intended to replace advice given to you by your health care provider. Make sure you discuss any questions you have with your health care provider. Document  Released: 03/19/2005 Document Revised: 08/25/2015 Document Reviewed: 11/24/2012 Elsevier Interactive Patient Education  2017 [employer]  

## 2017-11-25 ENCOUNTER — Telehealth: Payer: Self-pay | Admitting: *Deleted

## 2017-11-25 NOTE — Telephone Encounter (Signed)
"  I'm calling to receive the dates I received my last chemotherapy treatment."  Received Cycle 6 of R[facility] June 05, 2016 through June 09, 2016.  Denies further questions or needs at this time.

## 2017-11-27 ENCOUNTER — Inpatient Hospital Stay: Payer: Medicare Other | Attending: Hematology and Oncology

## 2017-11-27 DIAGNOSIS — Z95828 Presence of other vascular implants and grafts: Secondary | ICD-10-CM

## 2017-11-27 DIAGNOSIS — Z452 Encounter for adjustment and management of vascular access device: Secondary | ICD-10-CM | POA: Diagnosis not present

## 2017-11-27 DIAGNOSIS — C8338 Diffuse large B-cell lymphoma, lymph nodes of multiple sites: Secondary | ICD-10-CM | POA: Diagnosis not present

## 2017-11-27 MED ORDER — SODIUM CHLORIDE 0.9% FLUSH
10.0000 mL | Freq: Once | INTRAVENOUS | Status: AC
  Administered 2017-11-27: 10 mL
  Filled 2017-11-27: qty 10

## 2017-11-27 MED ORDER — HEPARIN SOD (PORK) LOCK FLUSH 100 UNIT/ML IV SOLN
500.0000 [IU] | Freq: Once | INTRAVENOUS | Status: AC
  Administered 2017-11-27: 500 [IU]
  Filled 2017-11-27: qty 5

## 2017-12-26 ENCOUNTER — Other Ambulatory Visit: Payer: Self-pay | Admitting: Hematology and Oncology

## 2017-12-26 ENCOUNTER — Telehealth: Payer: Self-pay

## 2017-12-26 DIAGNOSIS — C8338 Diffuse large B-cell lymphoma, lymph nodes of multiple sites: Secondary | ICD-10-CM

## 2017-12-26 NOTE — Telephone Encounter (Signed)
He called and left a message regarding upcoming appt on 10/14 to see Dr. Alvy Bimler. Will he need a scan before appt?

## 2017-12-26 NOTE — Telephone Encounter (Signed)
No, based on last normal scans, labs only unless he has new symtpoms

## 2017-12-26 NOTE — Telephone Encounter (Signed)
Called and given below message. He Verbalized understanding. He reports no new symptoms.Marland Kitchen

## 2018-01-13 ENCOUNTER — Encounter: Payer: Self-pay | Admitting: Hematology and Oncology

## 2018-01-13 ENCOUNTER — Inpatient Hospital Stay (HOSPITAL_BASED_OUTPATIENT_CLINIC_OR_DEPARTMENT_OTHER): Payer: Medicare Other | Admitting: Hematology and Oncology

## 2018-01-13 ENCOUNTER — Inpatient Hospital Stay: Payer: Medicare Other

## 2018-01-13 ENCOUNTER — Inpatient Hospital Stay: Payer: Medicare Other | Attending: Hematology and Oncology

## 2018-01-13 ENCOUNTER — Telehealth: Payer: Self-pay | Admitting: Hematology and Oncology

## 2018-01-13 VITALS — BP 158/66 | HR 78 | Temp 98.0°F | Resp 18 | Ht 70.0 in | Wt 224.4 lb

## 2018-01-13 DIAGNOSIS — Z9221 Personal history of antineoplastic chemotherapy: Secondary | ICD-10-CM | POA: Diagnosis not present

## 2018-01-13 DIAGNOSIS — C8338 Diffuse large B-cell lymphoma, lymph nodes of multiple sites: Secondary | ICD-10-CM

## 2018-01-13 DIAGNOSIS — Z79899 Other long term (current) drug therapy: Secondary | ICD-10-CM | POA: Insufficient documentation

## 2018-01-13 DIAGNOSIS — Z95828 Presence of other vascular implants and grafts: Secondary | ICD-10-CM

## 2018-01-13 DIAGNOSIS — Z7982 Long term (current) use of aspirin: Secondary | ICD-10-CM

## 2018-01-13 LAB — COMPREHENSIVE METABOLIC PANEL
ALBUMIN: 4.4 g/dL (ref 3.5–5.0)
ALT: 22 U/L (ref 0–44)
ANION GAP: 11 (ref 5–15)
AST: 19 U/L (ref 15–41)
Alkaline Phosphatase: 52 U/L (ref 38–126)
BUN: 17 mg/dL (ref 8–23)
CHLORIDE: 101 mmol/L (ref 98–111)
CO2: 28 mmol/L (ref 22–32)
Calcium: 9.6 mg/dL (ref 8.9–10.3)
Creatinine, Ser: 0.82 mg/dL (ref 0.61–1.24)
Glucose, Bld: 177 mg/dL — ABNORMAL HIGH (ref 70–99)
POTASSIUM: 4.2 mmol/L (ref 3.5–5.1)
SODIUM: 140 mmol/L (ref 135–145)
Total Bilirubin: 0.8 mg/dL (ref 0.3–1.2)
Total Protein: 6.8 g/dL (ref 6.5–8.1)

## 2018-01-13 LAB — CBC WITH DIFFERENTIAL/PLATELET
ABS IMMATURE GRANULOCYTES: 0.03 10*3/uL (ref 0.00–0.07)
BASOS PCT: 1 %
Basophils Absolute: 0.1 10*3/uL (ref 0.0–0.1)
Eosinophils Absolute: 0.6 10*3/uL — ABNORMAL HIGH (ref 0.0–0.5)
Eosinophils Relative: 7 %
HEMATOCRIT: 44.4 % (ref 39.0–52.0)
Hemoglobin: 15.2 g/dL (ref 13.0–17.0)
IMMATURE GRANULOCYTES: 0 %
Lymphocytes Relative: 20 %
Lymphs Abs: 1.6 10*3/uL (ref 0.7–4.0)
MCH: 31.2 pg (ref 26.0–34.0)
MCHC: 34.2 g/dL (ref 30.0–36.0)
MCV: 91.2 fL (ref 80.0–100.0)
MONO ABS: 1.2 10*3/uL — AB (ref 0.1–1.0)
MONOS PCT: 15 %
NEUTROS PCT: 57 %
Neutro Abs: 4.6 10*3/uL (ref 1.7–7.7)
Platelets: 180 10*3/uL (ref 150–400)
RBC: 4.87 MIL/uL (ref 4.22–5.81)
RDW: 12.8 % (ref 11.5–15.5)
WBC: 8.1 10*3/uL (ref 4.0–10.5)
nRBC: 0 % (ref 0.0–0.2)

## 2018-01-13 LAB — LACTATE DEHYDROGENASE: LDH: 168 U/L (ref 98–192)

## 2018-01-13 MED ORDER — HEPARIN SOD (PORK) LOCK FLUSH 100 UNIT/ML IV SOLN
500.0000 [IU] | Freq: Once | INTRAVENOUS | Status: AC
  Administered 2018-01-13: 500 [IU]
  Filled 2018-01-13: qty 5

## 2018-01-13 MED ORDER — SODIUM CHLORIDE 0.9% FLUSH
10.0000 mL | Freq: Once | INTRAVENOUS | Status: AC
  Administered 2018-01-13: 10 mL
  Filled 2018-01-13: qty 10

## 2018-01-13 NOTE — Progress Notes (Signed)
[place] OFFICE PROGRESS NOTE  Patient Care Team: Wenda Low, MD as PCP - General (Internal Medicine)  ASSESSMENT & PLAN:  Diffuse large B-cell lymphoma of lymph nodes of multiple regions [facility]) CT scan showed no evidence of lymphoma recurrence I plan to see him back in 6 months with repeat history, physical examination, imaging study and blood work We will also schedule port flushes to maintain port patency for at least 2 years since discontinuation of treatment  Morbid obesity due to excess calories [facility]) The patient have multiple cardiovascular risk factors We discussed the importance of dietary modification and weight loss and he appears motivated   Orders Placed This Encounter  Procedures  . CT ABDOMEN PELVIS W CONTRAST    Standing Status:   Future    Standing Expiration Date:   01/14/2019    Order Specific Question:   If indicated for the ordered procedure, I authorize the administration of contrast media per Radiology protocol    Answer:   Yes    Order Specific Question:   Preferred imaging location?    Answer:   [facility]    Order Specific Question:   Radiology Contrast Protocol - do NOT remove file path    Answer:   \\[email or web address].pdf  . CT CHEST W CONTRAST    Standing Status:   Future    Standing Expiration Date:   01/14/2019    Order Specific Question:   If indicated for the ordered procedure, I authorize the administration of contrast media per Radiology protocol    Answer:   Yes    Order Specific Question:   Preferred imaging location?    Answer:   [facility]    Order Specific Question:   Radiology Contrast Protocol - do NOT remove file path    Answer:   \\[email or web address].pdf  . Comprehensive metabolic panel    Standing Status:   Future    Standing Expiration Date:   02/17/2019  . CBC with Differential/Platelet    Standing Status:   Future    Standing Expiration Date:    02/17/2019  . Lactate dehydrogenase    Standing Status:   Future    Standing Expiration Date:   01/14/2019    INTERVAL HISTORY: Please see below for problem oriented charting. 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 Stable ectasia or post stenotic dilatation of the right external iliac artery measuring up to 1.5 cm. Extensive atherosclerotic disease involving the abdominal aorta with a chronic intimal flap or short segment dissection in the infrarenal abdominal aorta. Chronic occlusion of the left common iliac artery. Slowly enlarging retroperitoneal lymph nodes as described. An indolent neoplastic or lymphoproliferative process cannot be excluded    01/06/2016 Imaging    Ct abdomen and pelvis showed new extensive centrally necrotic retroperitoneal and bilateral pelvic lymphadenopathy. Differential includes lymphoproliferative condition (lymphoma/leukemia) or infectious lymphadenitis (such as due to bacterial, viral or mycobacterial etiologies). Squamous cell carcinoma  metastases can have a similar appearance, although this is less likely given the location. Clinical and laboratory evaluation for a lymphoproliferative condition and tissue sampling is advised. 2. Aortic atherosclerosis. Stable 3.1 cm infrarenal abdominal aortic aneurysm    01/27/2016 Imaging    CH chest showed no acute cardiopulmonary disease. Known stable retrocrural and periaortic adenopathy. Oval density over  the left neck base measuring 2.5 x 4.5 cm likely due partially to prominent internal jugular vein, although cannot exclude adenopathy in this region. Recommend neck CT with contrast. Three vessel atherosclerotic coronary artery disease. Few small thyroid nodules with the largest measuring 1.2 cm over the left lobe. 2.4 cm left renal cyst. Sub cm right renal hypodensity unchanged and too small to characterize but likely a cyst. Minimal cholelithiasis.    01/31/2016 Imaging    ECHO showed: Left ventricle: The cavity size was normal. Systolic function was normal. The estimated ejection fraction was in the range of 60% to 65%. Wall motion was normal; there were no regional wall motion abnormalities. There was an increased relative contribution of atrial contraction to ventricular filling. Doppler parameters are consistent with abnormal left ventricular relaxation (grade 1 diastolic dysfunction).    02/07/2016 Procedure    He had imaging guided biopsy of supraclavicular lymph node    02/07/2016 Pathology Results    Accession: [id number] Core biopsies reveal diffuse areas of large atypical lymphoid cells. There are multiple cells with very large irregular nuclei or multinucleated cells. Immunohistochemistry reveals the cells are positive for CD20, CD45, bcl-2, and bcl-6. They are negative for CD10, CD30, and ALK. CD3, CD4, CD5 and CD8 reveal scattered T-cells. Ki-67 reveals an elevated proliferation rate (95%). The cells are negative for cytokeratin 7, cytokeratin 20, TTF-1, CDX-2, HMB45, SOX11, and MelanA. Overall, these findings are consistent with a diffuse large B-cell lymphoma    02/12/2016 - 02/21/2016 Hospital Admission    He was admitted to the hospital with sepsis. He received cycle 1 of chemotherapy will hospitalized    02/14/2016 Imaging    Ct abdomen and pelvis showed widespread retrocrural, retroperitoneal, and bilateral pelvic lymphadenopathy, waxing/waning when compared to recent CT, as above.  Mild right hydronephrosis, likely secondary to extrinsic compression by right retroperitoneal lymphadenopathy, with associated diminished enhancement of the right kidney. Spleen is normal in size.    02/16/2016 Bone Marrow Biopsy    Bone marrow biopsy was negative for lymphoma involvement. Cytogenetics came back abnormal: -Y in 45% of cell lines    02/16/2016 Procedure    He had port placement    02/16/2016 - 02/21/2016 Chemotherapy    He received cycle 1 of R-EPOCH     03/08/2016 - 03/12/2016 Hospital Admission    The patient received cycle 2 of chemotherapy in the hospital    03/27/2016 PET scan    Significant decrease in size of abdominal and pelvic lymphadenopathy compared to previous CT, which shows low-grade metabolic activity. No evidence of active lymphoma within the neck or chest. 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 Continued annual follow-up by CT is recommended.     04/03/2016 - 04/07/2016 Hospital Admission    The patient received cycle 3 of chemotherapy in the hospital    04/20/2016 Procedure    He underwent successful intrathecal injection of chemotherapy without complication    1/44/3154 Pathology Results    [id number] CSF fluid negative for malignant cells    04/24/2016 - 04/28/2016 Hospital Admission    The patient received cycle 4 of chemotherapy in the hospital  05/15/2016 - 05/19/2016 Hospital Admission    He received cycle 5 of chemotherapy in the hospital    05/17/2016 Procedure    Successful intrathecal injection of methotrexate.    06/05/2016 - 06/09/2016 Hospital Admission    He is admitted to the hospital for cycle 6 of chemotherapy    06/07/2016 Procedure    Successful intrathecal chemotherapy injection    06/27/2016 PET scan    Further reduction in size and metabolic activity of lower abdominal and pelvic lymph nodes indicating response to therapy. 2. 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 3. Other imaging findings of  potential clinical significance: Mucous retention cyst in the left maxillary sinus. Right mastoid effusion. Left renal cyst. Coronary, aortic arch, and branch vessel atherosclerotic vascular disease. Aortoiliac atherosclerotic vascular disease. Prominent stool throughout the colon favors constipation. Indirect left inguinal hernia containing adipose tissue. Cholelithiasis.    06/28/2016 Procedure    Intrathecal injection of chemotherapy without complication    11/17/5629 PET scan    1. Small LEFT external iliac lymph node is increased metabolic activity compared to prior (similar to liver activity ( [place] 3). No additional hypermetabolic adenopathy in the chest, abdomen, or pelvis. Recommend attention on follow-up. 2. Normal spleen and marrow.    12/19/2016 PET scan    1. No abnormal hypermetabolism in the neck, chest, abdomen or pelvis. 2. Aortic atherosclerosis (ICD10-170.0). Three-vessel coronary artery calcification. 3. Right common iliac artery aneurysm. 4. Cholelithiasis.    07/08/2017 Imaging    1. No definite findings to suggest recurrent lymphoma in the chest, abdomen or pelvis. 2. Spectrum of findings in the right lung, most evident in the right lower lobe, suggestive of very mild bronchopneumonia. 3. Aortic atherosclerosis, in addition to three-vessel coronary artery disease. Assessment for potential risk factor modification, dietary therapy or pharmacologic therapy may be warranted, if clinically indicated. 4. In addition, there is aneurysmal dilatation of the right common iliac artery which is larger than the prior study, currently measuring 3.0 x 2.8 cm. 5. Left inguinal hernia containing fat. 6. Additional incidental findings, as above.  Aortic Atherosclerosis (ICD10-I70.0).     REVIEW OF SYSTEMS:   Constitutional: Denies fevers, chills or abnormal weight loss Eyes: Denies blurriness of vision Ears, nose, mouth, throat, and face: Denies mucositis or sore  throat Respiratory: Denies cough, dyspnea or wheezes Cardiovascular: Denies palpitation, chest discomfort or lower extremity swelling Gastrointestinal:  Denies nausea, heartburn or change in bowel habits Skin: Denies abnormal skin rashes Lymphatics: Denies new lymphadenopathy or easy bruising Neurological:Denies numbness, tingling or new weaknesses Behavioral/Psych: Mood is stable, no new changes  All other systems were reviewed with the patient and are negative.  I have reviewed the past medical history, past surgical history, social history and family history with the patient and they are unchanged from previous note.  ALLERGIES:  is allergic to bee venom; metformin and related; and sulfa drugs cross reactors.  MEDICATIONS:  Current Outpatient Medications  Medication Sig Dispense Refill  . amLODipine (NORVASC) 10 MG tablet Take 10 mg by mouth every morning.     Marland Kitchen aspirin EC 81 MG tablet Take 81 mg by mouth every morning.     Marland Kitchen atorvastatin (LIPITOR) 40 MG tablet Take 40 mg by mouth every evening.     . Cholecalciferol (VITAMIN D3) 25 MCG TABS Take 50 mcg by mouth.    . clopidogrel (PLAVIX) 75 MG tablet Take 75 mg by mouth daily.    Marland Kitchen lidocaine-prilocaine (EMLA) cream Apply 1 application  topically as needed. (Patient taking differently: Apply 1 application topically daily as needed (port access). ) 30 g 6   No current facility-administered medications for this visit.     PHYSICAL EXAMINATION: ECOG PERFORMANCE STATUS: 0 - Asymptomatic  Vitals:   01/13/18 1154  BP: (!) 158/66  Pulse: 78  Resp: 18  Temp: 98 F (36.7 C)  SpO2: 95%   Filed Weights   01/13/18 1154  Weight: 224 lb 6.4 oz (101.8 kg)    GENERAL:alert, no distress and comfortable SKIN: skin color, texture, turgor are normal, no rashes or significant lesions EYES: normal, Conjunctiva are pink and non-injected, sclera clear OROPHARYNX:no exudate, no erythema and lips, buccal mucosa, and tongue normal  NECK:  supple, thyroid normal size, non-tender, without nodularity LYMPH:  no palpable lymphadenopathy in the cervical, axillary or inguinal LUNGS: clear to auscultation and percussion with normal breathing effort HEART: regular rate & rhythm and no murmurs and no lower extremity edema ABDOMEN:abdomen soft, non-tender and normal bowel sounds Musculoskeletal:no cyanosis of digits and no clubbing  NEURO: alert & oriented x 3 with fluent speech, no focal motor/sensory deficits  LABORATORY DATA:  I have reviewed the data as listed    Component Value Date/Time   NA 140 01/13/2018 1132   NA 141 12/18/2016 1403   K 4.2 01/13/2018 1132   K 4.4 12/18/2016 1403   CL 101 01/13/2018 1132   CO2 28 01/13/2018 1132   CO2 28 12/18/2016 1403   GLUCOSE 177 (H) 01/13/2018 1132   GLUCOSE 125 12/18/2016 1403   BUN 17 01/13/2018 1132   BUN 19.3 12/18/2016 1403   CREATININE 0.82 01/13/2018 1132   CREATININE 1.0 12/18/2016 1403   CALCIUM 9.6 01/13/2018 1132   CALCIUM 9.6 12/18/2016 1403   PROT 6.8 01/13/2018 1132   PROT 6.4 12/18/2016 1403   ALBUMIN 4.4 01/13/2018 1132   ALBUMIN 3.9 12/18/2016 1403   AST 19 01/13/2018 1132   AST 16 12/18/2016 1403   ALT 22 01/13/2018 1132   ALT 16 12/18/2016 1403   ALKPHOS 52 01/13/2018 1132   ALKPHOS 66 12/18/2016 1403   BILITOT 0.8 01/13/2018 1132   BILITOT 0.34 12/18/2016 1403   GFRNONAA >60 01/13/2018 1132   GFRAA >60 01/13/2018 1132    No results found for: SPEP, UPEP  Lab Results  Component Value Date   WBC 8.1 01/13/2018   NEUTROABS 4.6 01/13/2018   HGB 15.2 01/13/2018   HCT 44.4 01/13/2018   MCV 91.2 01/13/2018   PLT 180 01/13/2018      Chemistry      Component Value Date/Time   NA 140 01/13/2018 1132   NA 141 12/18/2016 1403   K 4.2 01/13/2018 1132   K 4.4 12/18/2016 1403   CL 101 01/13/2018 1132   CO2 28 01/13/2018 1132   CO2 28 12/18/2016 1403   BUN 17 01/13/2018 1132   BUN 19.3 12/18/2016 1403   CREATININE 0.82 01/13/2018 1132    CREATININE 1.0 12/18/2016 1403      Component Value Date/Time   CALCIUM 9.6 01/13/2018 1132   CALCIUM 9.6 12/18/2016 1403   ALKPHOS 52 01/13/2018 1132   ALKPHOS 66 12/18/2016 1403   AST 19 01/13/2018 1132   AST 16 12/18/2016 1403   ALT 22 01/13/2018 1132   ALT 16 12/18/2016 1403   BILITOT 0.8 01/13/2018 1132   BILITOT 0.34 12/18/2016 1403      All questions were answered. The patient knows to call the clinic with any problems, questions  or concerns. No barriers to learning was detected.  I spent 10 minutes counseling the patient face to face. The total time spent in the appointment was 15 minutes and more than 50% was on counseling and review of test results  Heath Lark, MD 01/13/2018 2:42 PM

## 2018-01-13 NOTE — Assessment & Plan Note (Signed)
The patient have multiple cardiovascular risk factors We discussed the importance of dietary modification and weight loss and he appears motivated

## 2018-01-13 NOTE — Telephone Encounter (Signed)
Gave avs and calendar ° °

## 2018-01-13 NOTE — Assessment & Plan Note (Signed)
CT scan showed no evidence of lymphoma recurrence I plan to see him back in 6 months with repeat history, physical examination, imaging study and blood work We will also schedule port flushes to maintain port patency for at least 2 years since discontinuation of treatment

## 2018-01-14 ENCOUNTER — Telehealth: Payer: Self-pay | Admitting: Hematology and Oncology

## 2018-01-14 NOTE — Telephone Encounter (Signed)
Appts added and LMVM for patient with date/time per 10/14 sch msg

## 2018-02-04 ENCOUNTER — Telehealth: Payer: Self-pay | Admitting: Vascular Surgery

## 2018-02-04 NOTE — Telephone Encounter (Signed)
-----   Message from Penni Homans, RN sent at 01/29/2018  3:37 PM EDT ----- I take that as he wants him to have the AAA duplex in Dec. Does that help? If you are still not comfortable reply back or ask him while he is here today. Thanks! ----- Message ----- From: Buelah Manis Sent: 01/29/2018   3:13 PM EDT To: Penni Homans, RN  Not really sure how to sch this pt?  ----- Message ----- From: Angelia Mould, MD Sent: 01/27/2018   8:28 AM EDT To: Buelah Manis  He should have a Duplex in Dec. AAA Thanks CD ----- Message ----- From: Buelah Manis Sent: 01/22/2018  11:22 AM EDT To: Angelia Mould, MD  Pt came in and wanted to know if he needed to sch a AAA scan now or if he can wait til April 2020 per Dr Alvy Bimler?

## 2018-02-04 NOTE — Telephone Encounter (Signed)
sch appt spk to pt mld ltr 03-19-18 11am AAA 1145 am f/u MD

## 2018-02-11 ENCOUNTER — Other Ambulatory Visit: Payer: Self-pay

## 2018-02-11 DIAGNOSIS — I714 Abdominal aortic aneurysm, without rupture, unspecified: Secondary | ICD-10-CM

## 2018-02-11 DIAGNOSIS — I723 Aneurysm of iliac artery: Secondary | ICD-10-CM

## 2018-02-13 ENCOUNTER — Telehealth: Payer: Self-pay

## 2018-02-13 NOTE — Telephone Encounter (Signed)
Patient's wife Samuel Berger called in regards to husband Samuel Berger), He has not been feeling well and he has no appetite.  They want to be seen to make sure everything is alright.  Appt for lab and Lucianne Lei in [facility] scheduled.

## 2018-02-14 ENCOUNTER — Inpatient Hospital Stay (HOSPITAL_BASED_OUTPATIENT_CLINIC_OR_DEPARTMENT_OTHER): Payer: Medicare Other | Admitting: Medical

## 2018-02-14 ENCOUNTER — Encounter: Payer: Medicare Other | Admitting: Vascular Surgery

## 2018-02-14 ENCOUNTER — Other Ambulatory Visit: Payer: Self-pay | Admitting: Medical

## 2018-02-14 ENCOUNTER — Inpatient Hospital Stay: Payer: Medicare Other | Attending: Hematology and Oncology

## 2018-02-14 ENCOUNTER — Ambulatory Visit (HOSPITAL_COMMUNITY)
Admission: RE | Admit: 2018-02-14 | Discharge: 2018-02-14 | Disposition: A | Discharge status: Home / Self Care | Payer: Medicare Other | Source: Ambulatory Visit | Attending: Medical | Admitting: Medical

## 2018-02-14 VITALS — BP 118/50 | HR 102 | Temp 98.5°F | Resp 18 | Ht 70.0 in | Wt 223.3 lb

## 2018-02-14 DIAGNOSIS — R590 Localized enlarged lymph nodes: Secondary | ICD-10-CM

## 2018-02-14 DIAGNOSIS — Z79899 Other long term (current) drug therapy: Secondary | ICD-10-CM

## 2018-02-14 DIAGNOSIS — R220 Localized swelling, mass and lump, head: Secondary | ICD-10-CM | POA: Insufficient documentation

## 2018-02-14 DIAGNOSIS — E785 Hyperlipidemia, unspecified: Secondary | ICD-10-CM

## 2018-02-14 DIAGNOSIS — C8338 Diffuse large B-cell lymphoma, lymph nodes of multiple sites: Secondary | ICD-10-CM

## 2018-02-14 DIAGNOSIS — E119 Type 2 diabetes mellitus without complications: Secondary | ICD-10-CM

## 2018-02-14 DIAGNOSIS — K219 Gastro-esophageal reflux disease without esophagitis: Secondary | ICD-10-CM

## 2018-02-14 DIAGNOSIS — R630 Anorexia: Secondary | ICD-10-CM | POA: Insufficient documentation

## 2018-02-14 DIAGNOSIS — R221 Localized swelling, mass and lump, neck: Secondary | ICD-10-CM

## 2018-02-14 DIAGNOSIS — R5383 Other fatigue: Secondary | ICD-10-CM | POA: Insufficient documentation

## 2018-02-14 DIAGNOSIS — K1370 Unspecified lesions of oral mucosa: Secondary | ICD-10-CM | POA: Diagnosis not present

## 2018-02-14 DIAGNOSIS — R400 Somnolence: Secondary | ICD-10-CM | POA: Insufficient documentation

## 2018-02-14 DIAGNOSIS — I10 Essential (primary) hypertension: Secondary | ICD-10-CM

## 2018-02-14 DIAGNOSIS — Z8673 Personal history of transient ischemic attack (TIA), and cerebral infarction without residual deficits: Secondary | ICD-10-CM

## 2018-02-14 DIAGNOSIS — Z452 Encounter for adjustment and management of vascular access device: Secondary | ICD-10-CM | POA: Insufficient documentation

## 2018-02-14 LAB — COMPREHENSIVE METABOLIC PANEL
ALK PHOS: 65 U/L (ref 38–126)
ALT: 23 U/L (ref 0–44)
AST: 26 U/L (ref 15–41)
Albumin: 3.8 g/dL (ref 3.5–5.0)
Anion gap: 9 (ref 5–15)
BUN: 17 mg/dL (ref 8–23)
CALCIUM: 9.6 mg/dL (ref 8.9–10.3)
CO2: 26 mmol/L (ref 22–32)
CREATININE: 1.05 mg/dL (ref 0.61–1.24)
Chloride: 100 mmol/L (ref 98–111)
Glucose, Bld: 148 mg/dL — ABNORMAL HIGH (ref 70–99)
Potassium: 4.6 mmol/L (ref 3.5–5.1)
Sodium: 135 mmol/L (ref 135–145)
TOTAL PROTEIN: 7 g/dL (ref 6.5–8.1)
Total Bilirubin: 1.1 mg/dL (ref 0.3–1.2)

## 2018-02-14 LAB — CBC WITH DIFFERENTIAL/PLATELET
ABS IMMATURE GRANULOCYTES: 0.05 10*3/uL (ref 0.00–0.07)
BASOS PCT: 1 %
Basophils Absolute: 0.1 10*3/uL (ref 0.0–0.1)
EOS PCT: 1 %
Eosinophils Absolute: 0.1 10*3/uL (ref 0.0–0.5)
HCT: 44.5 % (ref 39.0–52.0)
Hemoglobin: 15 g/dL (ref 13.0–17.0)
Immature Granulocytes: 1 %
LYMPHS PCT: 13 %
Lymphs Abs: 1.4 10*3/uL (ref 0.7–4.0)
MCH: 30.7 pg (ref 26.0–34.0)
MCHC: 33.7 g/dL (ref 30.0–36.0)
MCV: 91.2 fL (ref 80.0–100.0)
MONO ABS: 2.5 10*3/uL — AB (ref 0.1–1.0)
MONOS PCT: 23 %
NEUTROS ABS: 6.9 10*3/uL (ref 1.7–7.7)
Neutrophils Relative %: 61 %
Platelets: 171 10*3/uL (ref 150–400)
RBC: 4.88 MIL/uL (ref 4.22–5.81)
RDW: 12.9 % (ref 11.5–15.5)
WBC: 11 10*3/uL — AB (ref 4.0–10.5)
nRBC: 0 % (ref 0.0–0.2)

## 2018-02-14 LAB — LACTATE DEHYDROGENASE: LDH: 195 U/L — AB (ref 98–192)

## 2018-02-14 MED ORDER — AMOXICILLIN-POT CLAVULANATE 875-125 MG PO TABS: 1.0000 | ORAL_TABLET | Freq: Two times a day (BID) | ORAL | 0 refills | Status: DC

## 2018-02-14 MED ORDER — IOHEXOL 300 MG/ML  SOLN
100.0000 mL | Freq: Once | INTRAMUSCULAR | Status: AC | PRN
  Administered 2018-02-14: 100 mL via INTRAVENOUS

## 2018-02-14 MED ORDER — HEPARIN SOD (PORK) LOCK FLUSH 100 UNIT/ML IV SOLN
500.0000 [IU] | Freq: Once | INTRAVENOUS | Status: AC
  Administered 2018-02-14: 500 [IU] via INTRAVENOUS

## 2018-02-14 MED ORDER — SODIUM CHLORIDE (PF) 0.9 % IJ SOLN
INTRAMUSCULAR | Status: AC
  Filled 2018-02-14: qty 50

## 2018-02-14 MED ORDER — HEPARIN SOD (PORK) LOCK FLUSH 100 UNIT/ML IV SOLN
INTRAVENOUS | Status: AC
  Administered 2018-02-14: 500 [IU] via INTRAVENOUS
  Filled 2018-02-14: qty 5

## 2018-02-14 NOTE — Patient Instructions (Signed)
Implanted Port Home Guide An implanted port is a type of central line that is placed under the skin. Central lines are used to provide IV access when treatment or nutrition needs to be given through a person's veins. Implanted ports are used for long-term IV access. An implanted port may be placed because:  You need IV medicine that would be irritating to the small veins in your hands or arms.  You need long-term IV medicines, such as antibiotics.  You need IV nutrition for a long period.  You need frequent blood draws for lab tests.  You need dialysis.  Implanted ports are usually placed in the chest area, but they can also be placed in the upper arm, the abdomen, or the leg. An implanted port has two main parts:  Reservoir. The reservoir is round and will appear as a small, raised area under your skin. The reservoir is the part where a needle is inserted to give medicines or draw blood.  Catheter. The catheter is a thin, flexible tube that extends from the reservoir. The catheter is placed into a large vein. Medicine that is inserted into the reservoir goes into the catheter and then into the vein.  How will I care for my incision site? Do not get the incision site wet. Bathe or shower as directed by your health care provider. How is my port accessed? Special steps must be taken to access the port:  Before the port is accessed, a numbing cream can be placed on the skin. This helps numb the skin over the port site.  Your health care provider uses a sterile technique to access the port. ? Your health care provider must put on a mask and sterile gloves. ? The skin over your port is cleaned carefully with an antiseptic and allowed to dry. ? The port is gently pinched between sterile gloves, and a needle is inserted into the port.  Only "non-coring" port needles should be used to access the port. Once the port is accessed, a blood return should be checked. This helps ensure that the port  is in the vein and is not clogged.  If your port needs to remain accessed for a constant infusion, a clear (transparent) bandage will be placed over the needle site. The bandage and needle will need to be changed every week, or as directed by your health care provider.  Keep the bandage covering the needle clean and dry. Do not get it wet. Follow your health care provider's instructions on how to take a shower or bath while the port is accessed.  If your port does not need to stay accessed, no bandage is needed over the port.  What is flushing? Flushing helps keep the port from getting clogged. Follow your health care provider's instructions on how and when to flush the port. Ports are usually flushed with saline solution or a medicine called heparin. The need for flushing will depend on how the port is used.  If the port is used for intermittent medicines or blood draws, the port will need to be flushed: ? After medicines have been given. ? After blood has been drawn. ? As part of routine maintenance.  If a constant infusion is running, the port may not need to be flushed.  How long will my port stay implanted? The port can stay in for as long as your health care provider thinks it is needed. When it is time for the port to come out, surgery will be   done to remove it. The procedure is similar to the one performed when the port was put in. When should I seek immediate medical care? When you have an implanted port, you should seek immediate medical care if:  You notice a bad smell coming from the incision site.  You have swelling, redness, or drainage at the incision site.  You have more swelling or pain at the port site or the surrounding area.  You have a fever that is not controlled with medicine.  This information is not intended to replace advice given to you by your health care provider. Make sure you discuss any questions you have with your health care provider. Document  Released: 03/19/2005 Document Revised: 08/25/2015 Document Reviewed: 11/24/2012 Elsevier Interactive Patient Education  2017 [employer]  

## 2018-02-14 NOTE — Progress Notes (Signed)
Pt presents with gen fatigue and anorexia.  Denies N/V/D or changes in bowel/bladder.  Afebrile.  Currently on Amoxicillin per his dentist d/t swelling and reported "blockage in my sinuses".  Swelling without redness or tenderness on R side of jaw.  Denies trouble swallowing or breathing.  No rash.

## 2018-02-14 NOTE — Progress Notes (Signed)
These results were called to Luisa Dago and were reviewed with him . His were answered. He expressed understanding. Abx changed to Augmentin 875-125 PO BID x 7 days.

## 2018-02-14 NOTE — Progress Notes (Signed)
These results were called to Samuel Berger and were reviewed with him . His were answered. He expressed understanding. Abx changed to Augmentin 875-125 PO BID x 7 days.

## 2018-02-18 DIAGNOSIS — Z125 Encounter for screening for malignant neoplasm of prostate: Secondary | ICD-10-CM | POA: Diagnosis not present

## 2018-02-18 DIAGNOSIS — I779 Disorder of arteries and arterioles, unspecified: Secondary | ICD-10-CM | POA: Diagnosis not present

## 2018-02-18 DIAGNOSIS — E7800 Pure hypercholesterolemia, unspecified: Secondary | ICD-10-CM | POA: Diagnosis not present

## 2018-02-18 DIAGNOSIS — I639 Cerebral infarction, unspecified: Secondary | ICD-10-CM | POA: Diagnosis not present

## 2018-02-18 DIAGNOSIS — I723 Aneurysm of iliac artery: Secondary | ICD-10-CM | POA: Diagnosis not present

## 2018-02-18 DIAGNOSIS — J449 Chronic obstructive pulmonary disease, unspecified: Secondary | ICD-10-CM | POA: Diagnosis not present

## 2018-02-18 DIAGNOSIS — Z0000 Encounter for general adult medical examination without abnormal findings: Secondary | ICD-10-CM | POA: Diagnosis not present

## 2018-02-18 DIAGNOSIS — C8510 Unspecified B-cell lymphoma, unspecified site: Secondary | ICD-10-CM | POA: Diagnosis not present

## 2018-02-18 DIAGNOSIS — Z1159 Encounter for screening for other viral diseases: Secondary | ICD-10-CM | POA: Diagnosis not present

## 2018-02-18 DIAGNOSIS — E1151 Type 2 diabetes mellitus with diabetic peripheral angiopathy without gangrene: Secondary | ICD-10-CM | POA: Diagnosis not present

## 2018-02-18 DIAGNOSIS — Z1389 Encounter for screening for other disorder: Secondary | ICD-10-CM | POA: Diagnosis not present

## 2018-02-18 DIAGNOSIS — I739 Peripheral vascular disease, unspecified: Secondary | ICD-10-CM | POA: Diagnosis not present

## 2018-02-18 DIAGNOSIS — I700 Atherosclerosis of aorta: Secondary | ICD-10-CM | POA: Diagnosis not present

## 2018-02-18 DIAGNOSIS — Z23 Encounter for immunization: Secondary | ICD-10-CM | POA: Diagnosis not present

## 2018-02-18 DIAGNOSIS — I10 Essential (primary) hypertension: Secondary | ICD-10-CM | POA: Diagnosis not present

## 2018-02-19 NOTE — Progress Notes (Signed)
[facility] Progress Note   Samuel Berger [id number] 1945/10/09 72 y.o.  [name] is managed by Dr. Heath Lark  Actively treated with chemotherapy/immunotherapy: no   Assessment: Plan:    Oral lesion - Plan: CT MAXILLOFACIAL W CONTRAST, CT Soft Tissue Neck W Contrast  Localized swelling, mass, and lump of head - Plan: CT MAXILLOFACIAL W CONTRAST, CT Soft Tissue Neck W Contrast  Localized swelling, mass or lump of neck - Plan: CT MAXILLOFACIAL W CONTRAST, CT Soft Tissue Neck W Contrast  Anterior cervical lymphadenopathy - Plan: CT MAXILLOFACIAL W CONTRAST, CT Soft Tissue Neck W Contrast   Right facial and neck swelling with anterior cervical lymphadenopathy and a right posterior buccal mucosal lesion: The patient was referred for a CT scan of the face and neck.  He will also contact his dentist.  He has been instructed to stop taking amoxicillin and has been placed on Augmentin.  CT scan of the face and neck showed the following:  1. Asymmetric soft tissue swelling with inflammatory stranding involving the right face as above, consistent with acute infection/cellulitis. Cellulitic infections at this location are often odontogenic in origin, and note is made of dehiscence of the alveolar ridge of the right first maxillary molar, which could serve as a source of infection. No discrete abscess or drainable fluid collection identified. 2. Mild asymmetric prominence of subcentimeter right cervical lymph nodes, favored to be reactive in nature. No pathologically enlarged adenopathy within the neck. 3. Chronic right-sided paranasal sinus disease.  Please see After Visit Summary for patient specific instructions.  Future Appointments  Date Time Provider [place]  02/24/2018 11:30 AM [facility] [place] None  03/19/2018 11:00 AM [facility] - [facility]  03/19/2018 11:45 AM Angelia Mould, MD [facility]  04/07/2018 11:30 AM [facility]  [place] [facility] None  05/19/2018 11:30 AM [facility] [place] [facility] None  06/30/2018 11:30 AM [facility] [place] [facility] None  07/28/2018 11:00 AM [facility] LAB 3 [facility] None  07/29/2018  3:00 PM Heath Lark, MD [facility] None    Orders Placed This Encounter  Procedures  . CT MAXILLOFACIAL W CONTRAST  . CT Soft Tissue Neck W Contrast       Subjective:   Patient ID:  Samuel Berger is a 72 y.o. (DOB June 02, 1945) male.  Chief Complaint:  Chief Complaint  Patient presents with  . Fatigue    HPI Samuel Berger is a 72 year old male with a history of a diffuse large B-cell lymphoma who has been followed by Dr. Simeon Craft such.  The patient has been followed conservatively for over 2 years since discontinuation of treatment.  He presents to the office today with a one-week history of anorexia, fatigue, increased somnolence, and right facial swelling.  He had a temperature of 100 yesterday.  He was seen by his dentist and placed on amoxicillin on Wednesday.  He denies nausea, vomiting, diarrhea, constipation, chills, chest pain, and shortness of breath.  Medications: I have reviewed the patient's current medications.  Allergies:  Allergies  Allergen Reactions  . Bee Venom Hives  . Metformin And Related     dizziness  . Sulfa Drugs Cross Reactors Other (See Comments)    Unknown    Past Medical History:  Diagnosis Date  . 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 Carotid artery occlusion   . Dyslipidemia   . GERD (gastroesophageal reflux disease)   . Hepatitis    ? age  15 or 16  . Hyperlipidemia   . Hypertension   . Iliac artery aneurysm, right ([place])   . large b cell lymphoma dx'd 02/2016  . Stroke ([place])    01-05-2012   [id number]  . Type II or unspecified type diabetes mellitus without mention of complication, not stated as uncontrolled     Past Surgical History:  Procedure Laterality Date  . basket procedure    . CAROTID ENDARTERECTOMY    . ENDARTERECTOMY   02/08/2012   Procedure: ENDARTERECTOMY CAROTID;  Surgeon: Mal Misty, MD;  Location: [facility];  Service: Vascular;  Laterality: Right;  . IR GENERIC HISTORICAL  02/07/2016   IR US GUIDE BX ASP/DRAIN 02/07/2016 Greggory Keen, MD [name]  . IR GENERIC HISTORICAL  02/16/2016   IR FLUORO GUIDE PORT INSERTION RIGHT 02/16/2016 Aletta Edouard, MD [name]  . IR GENERIC HISTORICAL  02/16/2016   IR US GUIDE VASC ACCESS RIGHT 02/16/2016 Aletta Edouard, MD [name]  . KIDNEY STONE SURGERY     removal  . PATCH ANGIOPLASTY  02/08/2012   Procedure: PATCH ANGIOPLASTY;  Surgeon: Mal Misty, MD;  Location: [facility];  Service: Vascular;  Laterality: Right;  with dacron patch angioplasty  . TEE WITHOUT CARDIOVERSION  01/08/2012   Procedure: TRANSESOPHAGEAL ECHOCARDIOGRAM (TEE);  Surgeon: Lelon Perla, MD;  Location[facility] ENDOSCOPY;  Service: Cardiovascular;  Laterality: N/A;  . TONSILLECTOMY      Family History  Problem Relation Age of Onset  . Diabetes Mother   . Lung cancer Father     Social History   Socioeconomic History  . Marital status: Married    Spouse name: anne  . Number of children: 2  . Years of education: college  . Highest education level: Not on file  Occupational History  . Occupation: retired  Scientific laboratory technician  . Financial resource strain: Not on file  . Food insecurity:    Worry: Not on file    Inability: Not on file  . Transportation needs:    Medical: Not on file    Non-medical: Not on file  Tobacco Use  . Smoking status: Former Smoker    Packs/day: 0.50    Years: 30.00    Pack years: 15.00    Types: Cigars    Last attempt to quit: 01/05/2012    Years since quitting: 6.1  . Smokeless tobacco: Former Network engineer and Sexual Activity  . Alcohol use: Yes    Alcohol/week: 3.0 standard drinks    Types: 1 Glasses of wine, 1 Cans of beer, 1 Shots of liquor per week    Comment: quit 2 months..Patient drinks coffee daily.OCCASIONAL DRINKER  . Drug use: No    . Sexual activity: Not Currently  Lifestyle  . Physical activity:    Days per week: Not on file    Minutes per session: Not on file  . Stress: Not on file  Relationships  . Social connections:    Talks on phone: Not on file    Gets together: Not on file    Attends religious service: Not on file    Active member of club or organization: Not on file    Attends meetings of clubs or organizations: Not on file    Relationship status: Not on file  . Intimate partner violence:    Fear of current or ex partner: Not on file    Emotionally abused: Not on file    Physically abused: Not on file    Forced  sexual activity: Not on file  Other Topics Concern  . Not on file  Social History Narrative  . Not on file    Past Medical History, Surgical history, Social history, and Family history were reviewed and updated as appropriate.   Please see review of systems for further details on the patient's review from today.   Review of Systems:  Review of Systems  Constitutional: Positive for appetite change, fatigue and fever. Negative for chills, diaphoresis and unexpected weight change.  HENT: Negative for trouble swallowing.        Right facial and neck swelling.  Respiratory: Negative for cough, chest tightness and shortness of breath.   Cardiovascular: Negative for chest pain and palpitations.  Gastrointestinal: Negative for abdominal pain, constipation, diarrhea, nausea and vomiting.  Neurological: Negative for dizziness and headaches.  Psychiatric/Behavioral: Positive for sleep disturbance (Increased somnolence).    Objective:   Physical Exam:  BP (!) 118/50 (BP Location: Right Arm, Patient Position: Sitting)   Pulse (!) 102   Temp 98.5 F (36.9 C) (Oral)   Resp 18   Ht 5\' 10"  (1.778 m)   Wt 223 lb 4.8 oz (101.3 kg)   SpO2 97%   BMI 32.04 kg/m  ECOG: 0  Physical Exam  Constitutional: No distress.  HENT:  Head: Atraumatic.    Mouth/Throat:    Cardiovascular: Normal  rate, regular rhythm and normal heart sounds. Exam reveals no gallop and no friction rub.  No murmur heard. Pulmonary/Chest: Effort normal and breath sounds normal. No respiratory distress. He has no wheezes. He has no rales.  Neurological: He is alert.  Skin: Skin is warm and dry. No rash noted. He is not diaphoretic. No erythema.    Lab Review:     Component Value Date/Time   NA 135 02/14/2018 1019   NA 141 12/18/2016 1403   K 4.6 02/14/2018 1019   K 4.4 12/18/2016 1403   CL 100 02/14/2018 1019   CO2 26 02/14/2018 1019   CO2 28 12/18/2016 1403   GLUCOSE 148 (H) 02/14/2018 1019   GLUCOSE 125 12/18/2016 1403   BUN 17 02/14/2018 1019   BUN 19.3 12/18/2016 1403   CREATININE 1.05 02/14/2018 1019   CREATININE 1.0 12/18/2016 1403   CALCIUM 9.6 02/14/2018 1019   CALCIUM 9.6 12/18/2016 1403   PROT 7.0 02/14/2018 1019   PROT 6.4 12/18/2016 1403   ALBUMIN 3.8 02/14/2018 1019   ALBUMIN 3.9 12/18/2016 1403   AST 26 02/14/2018 1019   AST 16 12/18/2016 1403   ALT 23 02/14/2018 1019   ALT 16 12/18/2016 1403   ALKPHOS 65 02/14/2018 1019   ALKPHOS 66 12/18/2016 1403   BILITOT 1.1 02/14/2018 1019   BILITOT 0.34 12/18/2016 1403   GFRNONAA >60 02/14/2018 1019   GFRAA >60 02/14/2018 1019       Component Value Date/Time   WBC 11.0 (H) 02/14/2018 1019   RBC 4.88 02/14/2018 1019   HGB 15.0 02/14/2018 1019   HGB 13.4 12/18/2016 1403   HCT 44.5 02/14/2018 1019   HCT 39.7 12/18/2016 1403   PLT 171 02/14/2018 1019   PLT 168 12/18/2016 1403   MCV 91.2 02/14/2018 1019   MCV 92.1 12/18/2016 1403   MCH 30.7 02/14/2018 1019   MCHC 33.7 02/14/2018 1019   RDW 12.9 02/14/2018 1019   RDW 15.6 (H) 12/18/2016 1403   LYMPHSABS 1.4 02/14/2018 1019   LYMPHSABS 1.4 12/18/2016 1403   MONOABS 2.5 (H) 02/14/2018 1019   MONOABS 1.3 (H) 12/18/2016 1403  EOSABS 0.1 02/14/2018 1019   EOSABS 1.2 (H) 12/18/2016 1403   BASOSABS 0.1 02/14/2018 1019   BASOSABS 0.1 12/18/2016 1403    -------------------------------  Imaging from last 24 hours (if applicable):  Radiology interpretation: Ct Soft Tissue Neck W Contrast  Result Date: 02/14/2018 CLINICAL DATA:  Initial evaluation for acute swelling about the right jaw for 1 day. History of lymphoma. EXAM: CT NECK WITH CONTRAST CT MAXILLOFACIAL WITH CONTRAST TECHNIQUE: Multidetector CT imaging of the face and neck was performed following the standard protocol with intravenous contrast. CONTRAST:  183mL OMNIPAQUE IOHEXOL 300 MG/ML  SOLN COMPARISON:  None. FINDINGS: Pharynx and larynx: Examination technically limited by motion artifact. Within normal limits without mass lesion or loculated fluid collection. Oropharynx and nasopharynx within normal limits. Minimal induration within the right parapharyngeal fat related to the inflammatory process within the right face. No retropharyngeal collection. Epiglottis within normal limits. Vallecula largely effaced by the lingual tonsils and not well assessed. Mild asymmetric fullness of the right hypopharynx and supraglottic larynx due to the inflammatory process within the right neck. Remainder of the hypopharynx and supraglottic larynx otherwise unremarkable. True cords not well assessed on this motion degraded exam. Subglottic airway clear. Salivary glands: Salivary glands including the parotid and submandibular glands are within normal limits. Thyroid: 19 mm hypodense heterogeneous left thyroid nodule present (series 4, image 88). Additional 12 mm nodule at the thyroid isthmus just to the right of midline (series 4, image 89). Lymph nodes: Mild asymmetric prominence of a few right-sided level II/III nodes noted measuring up to 9-10 mm, likely reactive. No other pathologically enlarged lymph nodes identified within the neck. Few scattered surgical clips noted within the right neck. Vascular: Normal intravascular enhancement seen throughout the neck. Moderate atherosclerotic change noted throughout  the major arterial vasculature. Internal jugular veins patent. Right-sided central venous catheter partially visualized. Limited intracranial: Visualized intracranial contents demonstrate no acute finding. Age related cerebral atrophy noted. Remote lacunar infarcts noted at the bilateral basal ganglia. Visualized orbits: Globes and orbital soft tissues demonstrate no acute finding. Patient status post bilateral ocular lens replacement. Mastoids and visualized paranasal sinuses: Moderate mucosal thickening throughout the right maxillary sinus, right ethmoidal air cells, and right sphenoid sinus, chronic in appearance. Small left maxillary sinus retention cyst noted. No air-fluid level to suggest acute sinusitis. Small right mastoid effusion, chronic in appearance. Middle ear cavities are well pneumatized and free of fluid. Skeleton: No acute osseous abnormality. No discrete lytic or blastic osseous lesions. Moderate cervical spondylolysis noted at C5-6 and C6-7. Upper chest: Partially visualized upper chest demonstrates no acute finding. Visualized lungs are clear. Other: There is asymmetric soft tissue swelling with inflammatory stranding involving the right face, primarily involving the right masticator space. Extension of inflammatory stranding into the right submandibular and parapharyngeal spaces, with mild extension into the right parotid space. Asymmetric thickening of the right platysmas. Overlying skin thickening. Findings concerning for acute infection/cellulitis. No discrete abscess or other drainable fluid collection identified. Note made of dehiscence of the underlying alveolar ridge adjacent to the right first maxillary molar along its lingual aspect (series 4, image 33). No adjacent odontogenic abscess. Facial infection/is these are often odontogenic in origin. IMPRESSION: 1. Asymmetric soft tissue swelling with inflammatory stranding involving the right face as above, consistent with acute  infection/cellulitis. Cellulitic infections at this location are often odontogenic in origin, and note is made of dehiscence of the alveolar ridge of the right first maxillary molar, which could serve as a source of infection.  No discrete abscess or drainable fluid collection identified. 2. Mild asymmetric prominence of subcentimeter right cervical lymph nodes, favored to be reactive in nature. No pathologically enlarged adenopathy within the neck. 3. Chronic right-sided paranasal sinus disease as above. Electronically Signed   By: Jeannine Boga M.D.   On: 02/14/2018 14:33   Ct Maxillofacial W Contrast  Result Date: 02/14/2018 CLINICAL DATA:  Initial evaluation for acute swelling about the right jaw for 1 day. History of lymphoma. EXAM: CT NECK WITH CONTRAST CT MAXILLOFACIAL WITH CONTRAST TECHNIQUE: Multidetector CT imaging of the face and neck was performed following the standard protocol with intravenous contrast. CONTRAST:  147mL OMNIPAQUE IOHEXOL 300 MG/ML  SOLN COMPARISON:  None. FINDINGS: Pharynx and larynx: Examination technically limited by motion artifact. Within normal limits without mass lesion or loculated fluid collection. Oropharynx and nasopharynx within normal limits. Minimal induration within the right parapharyngeal fat related to the inflammatory process within the right face. No retropharyngeal collection. Epiglottis within normal limits. Vallecula largely effaced by the lingual tonsils and not well assessed. Mild asymmetric fullness of the right hypopharynx and supraglottic larynx due to the inflammatory process within the right neck. Remainder of the hypopharynx and supraglottic larynx otherwise unremarkable. True cords not well assessed on this motion degraded exam. Subglottic airway clear. Salivary glands: Salivary glands including the parotid and submandibular glands are within normal limits. Thyroid: 19 mm hypodense heterogeneous left thyroid nodule present (series 4, image 88).  Additional 12 mm nodule at the thyroid isthmus just to the right of midline (series 4, image 89). Lymph nodes: Mild asymmetric prominence of a few right-sided level II/III nodes noted measuring up to 9-10 mm, likely reactive. No other pathologically enlarged lymph nodes identified within the neck. Few scattered surgical clips noted within the right neck. Vascular: Normal intravascular enhancement seen throughout the neck. Moderate atherosclerotic change noted throughout the major arterial vasculature. Internal jugular veins patent. Right-sided central venous catheter partially visualized. Limited intracranial: Visualized intracranial contents demonstrate no acute finding. Age related cerebral atrophy noted. Remote lacunar infarcts noted at the bilateral basal ganglia. Visualized orbits: Globes and orbital soft tissues demonstrate no acute finding. Patient status post bilateral ocular lens replacement. Mastoids and visualized paranasal sinuses: Moderate mucosal thickening throughout the right maxillary sinus, right ethmoidal air cells, and right sphenoid sinus, chronic in appearance. Small left maxillary sinus retention cyst noted. No air-fluid level to suggest acute sinusitis. Small right mastoid effusion, chronic in appearance. Middle ear cavities are well pneumatized and free of fluid. Skeleton: No acute osseous abnormality. No discrete lytic or blastic osseous lesions. Moderate cervical spondylolysis noted at C5-6 and C6-7. Upper chest: Partially visualized upper chest demonstrates no acute finding. Visualized lungs are clear. Other: There is asymmetric soft tissue swelling with inflammatory stranding involving the right face, primarily involving the right masticator space. Extension of inflammatory stranding into the right submandibular and parapharyngeal spaces, with mild extension into the right parotid space. Asymmetric thickening of the right platysmas. Overlying skin thickening. Findings concerning for  acute infection/cellulitis. No discrete abscess or other drainable fluid collection identified. Note made of dehiscence of the underlying alveolar ridge adjacent to the right first maxillary molar along its lingual aspect (series 4, image 33). No adjacent odontogenic abscess. Facial infection/is these are often odontogenic in origin. IMPRESSION: 1. Asymmetric soft tissue swelling with inflammatory stranding involving the right face as above, consistent with acute infection/cellulitis. Cellulitic infections at this location are often odontogenic in origin, and note is made of dehiscence of the alveolar  ridge of the right first maxillary molar, which could serve as a source of infection. No discrete abscess or drainable fluid collection identified. 2. Mild asymmetric prominence of subcentimeter right cervical lymph nodes, favored to be reactive in nature. No pathologically enlarged adenopathy within the neck. 3. Chronic right-sided paranasal sinus disease as above. Electronically Signed   By: Jeannine Boga M.D.   On: 02/14/2018 14:33

## 2018-02-24 ENCOUNTER — Inpatient Hospital Stay: Payer: Medicare Other

## 2018-02-24 DIAGNOSIS — C8338 Diffuse large B-cell lymphoma, lymph nodes of multiple sites: Secondary | ICD-10-CM

## 2018-02-24 DIAGNOSIS — R5383 Other fatigue: Secondary | ICD-10-CM | POA: Diagnosis not present

## 2018-02-24 DIAGNOSIS — R220 Localized swelling, mass and lump, head: Secondary | ICD-10-CM | POA: Diagnosis not present

## 2018-02-24 DIAGNOSIS — R400 Somnolence: Secondary | ICD-10-CM | POA: Diagnosis not present

## 2018-02-24 DIAGNOSIS — Z452 Encounter for adjustment and management of vascular access device: Secondary | ICD-10-CM | POA: Diagnosis not present

## 2018-02-24 DIAGNOSIS — R630 Anorexia: Secondary | ICD-10-CM | POA: Diagnosis not present

## 2018-02-24 DIAGNOSIS — Z95828 Presence of other vascular implants and grafts: Secondary | ICD-10-CM

## 2018-02-24 MED ORDER — HEPARIN SOD (PORK) LOCK FLUSH 100 UNIT/ML IV SOLN
500.0000 [IU] | Freq: Once | INTRAVENOUS | Status: AC
  Administered 2018-02-24: 500 [IU]
  Filled 2018-02-24: qty 5

## 2018-02-24 MED ORDER — SODIUM CHLORIDE 0.9% FLUSH
10.0000 mL | Freq: Once | INTRAVENOUS | Status: AC
  Administered 2018-02-24: 10 mL
  Filled 2018-02-24: qty 10

## 2018-02-24 NOTE — Patient Instructions (Signed)
Implanted Port Home Guide An implanted port is a type of central line that is placed under the skin. Central lines are used to provide IV access when treatment or nutrition needs to be given through a person's veins. Implanted ports are used for long-term IV access. An implanted port may be placed because:  You need IV medicine that would be irritating to the small veins in your hands or arms.  You need long-term IV medicines, such as antibiotics.  You need IV nutrition for a long period.  You need frequent blood draws for lab tests.  You need dialysis.  Implanted ports are usually placed in the chest area, but they can also be placed in the upper arm, the abdomen, or the leg. An implanted port has two main parts:  Reservoir. The reservoir is round and will appear as a small, raised area under your skin. The reservoir is the part where a needle is inserted to give medicines or draw blood.  Catheter. The catheter is a thin, flexible tube that extends from the reservoir. The catheter is placed into a large vein. Medicine that is inserted into the reservoir goes into the catheter and then into the vein.  How will I care for my incision site? Do not get the incision site wet. Bathe or shower as directed by your health care provider. How is my port accessed? Special steps must be taken to access the port:  Before the port is accessed, a numbing cream can be placed on the skin. This helps numb the skin over the port site.  Your health care provider uses a sterile technique to access the port. ? Your health care provider must put on a mask and sterile gloves. ? The skin over your port is cleaned carefully with an antiseptic and allowed to dry. ? The port is gently pinched between sterile gloves, and a needle is inserted into the port.  Only "non-coring" port needles should be used to access the port. Once the port is accessed, a blood return should be checked. This helps ensure that the port  is in the vein and is not clogged.  If your port needs to remain accessed for a constant infusion, a clear (transparent) bandage will be placed over the needle site. The bandage and needle will need to be changed every week, or as directed by your health care provider.  Keep the bandage covering the needle clean and dry. Do not get it wet. Follow your health care provider's instructions on how to take a shower or bath while the port is accessed.  If your port does not need to stay accessed, no bandage is needed over the port.  What is flushing? Flushing helps keep the port from getting clogged. Follow your health care provider's instructions on how and when to flush the port. Ports are usually flushed with saline solution or a medicine called heparin. The need for flushing will depend on how the port is used.  If the port is used for intermittent medicines or blood draws, the port will need to be flushed: ? After medicines have been given. ? After blood has been drawn. ? As part of routine maintenance.  If a constant infusion is running, the port may not need to be flushed.  How long will my port stay implanted? The port can stay in for as long as your health care provider thinks it is needed. When it is time for the port to come out, surgery will be   done to remove it. The procedure is similar to the one performed when the port was put in. When should I seek immediate medical care? When you have an implanted port, you should seek immediate medical care if:  You notice a bad smell coming from the incision site.  You have swelling, redness, or drainage at the incision site.  You have more swelling or pain at the port site or the surrounding area.  You have a fever that is not controlled with medicine.  This information is not intended to replace advice given to you by your health care provider. Make sure you discuss any questions you have with your health care provider. Document  Released: 03/19/2005 Document Revised: 08/25/2015 Document Reviewed: 11/24/2012 Elsevier Interactive Patient Education  2017 [employer]  

## 2018-03-03 ENCOUNTER — Telehealth: Payer: Self-pay | Admitting: *Deleted

## 2018-03-03 NOTE — Telephone Encounter (Signed)
-----   Message from Heath Lark, MD sent at 03/03/2018  6:28 AM EST ----- Regarding: how is he doing? Is the facial swelling gone?

## 2018-03-03 NOTE — Telephone Encounter (Signed)
Pt states facial swelling is gone. Everything is good.

## 2018-03-19 ENCOUNTER — Ambulatory Visit (HOSPITAL_COMMUNITY)
Admission: RE | Admit: 2018-03-19 | Discharge: 2018-03-19 | Disposition: A | Discharge status: Home / Self Care | Payer: Medicare Other | Source: Ambulatory Visit | Attending: Vascular Surgery | Admitting: Vascular Surgery

## 2018-03-19 ENCOUNTER — Other Ambulatory Visit: Payer: Self-pay

## 2018-03-19 ENCOUNTER — Inpatient Hospital Stay (HOSPITAL_COMMUNITY): Admission: RE | Admit: 2018-03-19 | Payer: Medicare Other | Source: Ambulatory Visit

## 2018-03-19 ENCOUNTER — Ambulatory Visit: Payer: Medicare Other | Admitting: Vascular Surgery

## 2018-03-19 ENCOUNTER — Ambulatory Visit (INDEPENDENT_AMBULATORY_CARE_PROVIDER_SITE_OTHER): Payer: Medicare Other | Admitting: Vascular Surgery

## 2018-03-19 ENCOUNTER — Encounter: Payer: Self-pay | Admitting: Vascular Surgery

## 2018-03-19 VITALS — BP 114/66 | HR 80 | Temp 97.3°F | Resp 20 | Ht 70.0 in | Wt 223.0 lb

## 2018-03-19 DIAGNOSIS — I714 Abdominal aortic aneurysm, without rupture, unspecified: Secondary | ICD-10-CM

## 2018-03-19 DIAGNOSIS — I723 Aneurysm of iliac artery: Secondary | ICD-10-CM

## 2018-03-19 DIAGNOSIS — I6523 Occlusion and stenosis of bilateral carotid arteries: Secondary | ICD-10-CM

## 2018-03-19 NOTE — Progress Notes (Signed)
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  HPI:   Samuel Berger is a pleasant 72 y.o. male who I last saw on 09/25/2017.  This patient had a right carotid endarterectomy in 2013 by Dr. Kellie Simmering.  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  At the time of his last visit the right common iliac artery aneurysm measured 3.0 cm in maximum diameter.  This was based on a CT of the abdomen and pelvis.  I explained we would only consider repair if this enlarged significantly.  This would likely require open repair given his severe iliac occlusive disease and significant calcific disease.  In addition the patient is status post right carotid endarterectomy.  He was on aspirin and Plavix at the time.  He already is scheduled for a follow-up carotid duplex scan is most recent duplex scan showed no evidence of recurrent disease.  Since I saw him last, he denies any history of stroke, TIAs, expressive or receptive aphasia, or amaurosis fugax.  He denies any claudication, rest pain, or nonhealing ulcers.  He remains fairly active and plays golf.  He is not a smoker.  His blood pressures been under good control.  He is on aspirin, Plavix, and a statin.  Past Medical History:  Diagnosis Date  . Aneurysm artery, iliac ([place])   . Carotid artery occlusion   . Dyslipidemia   . GERD (gastroesophageal reflux disease)   . Hepatitis    ? age 78 or 20  . Hyperlipidemia   . Hypertension   . Iliac artery aneurysm, right ([place])   . large b cell lymphoma dx'd 02/2016  . Stroke ([place])    01-05-2012   [id number]  . Type II or unspecified type diabetes mellitus without mention of complication, not stated as uncontrolled     Family History  Problem Relation Age of Onset  . Diabetes Mother   . Lung cancer Father     SOCIAL HISTORY: Social History   Tobacco Use  . Smoking  status: Former Smoker    Packs/day: 0.50    Years: 30.00    Pack years: 15.00    Types: Cigars    Last attempt to quit: 01/05/2012    Years since quitting: 6.2  . Smokeless tobacco: Former Network engineer Use Topics  . Alcohol use: Yes    Alcohol/week: 3.0 standard drinks    Types: 1 Glasses of wine, 1 Cans of beer, 1 Shots of liquor per week    Comment: quit 2 months..Patient drinks coffee daily.OCCASIONAL DRINKER    Allergies  Allergen Reactions  . Bee Venom Hives  . Metformin And Related     dizziness  . Sulfa Drugs Cross Reactors Other (See Comments)    Unknown    Current Outpatient Medications  Medication Sig Dispense Refill  . amLODipine (NORVASC) 10 MG tablet Take 10 mg by mouth every morning.     Marland Kitchen atorvastatin (LIPITOR) 40 MG tablet Take 40 mg by mouth every evening.     . Cholecalciferol (VITAMIN D3) 25 MCG TABS Take 50 mcg by mouth.    . clopidogrel (PLAVIX) 75 MG tablet Take 75 mg by mouth daily.    Marland Kitchen lidocaine-prilocaine (EMLA) cream Apply 1 application topically as needed. (Patient taking differently: Apply 1 application topically daily as needed (port access). ) 30 g 6  . lisinopril (  PRINIVIL,ZESTRIL) 5 MG tablet     . metFORMIN (GLUCOPHAGE) 500 MG tablet Take 500 mg by mouth 2 (two) times daily with a meal.    . aspirin EC 81 MG tablet Take 81 mg by mouth every morning.      No current facility-administered medications for this visit.     REVIEW OF SYSTEMS:  [X]  denotes positive finding, [ ]  denotes negative finding Cardiac  Comments:  Chest pain or chest pressure:    Shortness of breath upon exertion:    Short of breath when lying flat:    Irregular heart rhythm:        Vascular    Pain in calf, thigh, or hip brought on by ambulation:    Pain in feet at night that wakes you up from your sleep:     Blood clot in your veins:    Leg swelling:         Pulmonary    Oxygen at home:    Productive cough:     Wheezing:         Neurologic    Sudden  weakness in arms or legs:     Sudden numbness in arms or legs:     Sudden onset of difficulty speaking or slurred speech:    Temporary loss of vision in one eye:     Problems with dizziness:         Gastrointestinal    Blood in stool:     Vomited blood:         Genitourinary    Burning when urinating:     Blood in urine:        Psychiatric    Major depression:         Hematologic    Bleeding problems:    Problems with blood clotting too easily:        Skin    Rashes or ulcers:        Constitutional    Fever or chills:     PHYSICAL EXAM:   Vitals:   03/19/18 1006  BP: 114/66  Pulse: 80  Resp: 20  Temp: (!) 97.3 F (36.3 C)  SpO2: 97%  Weight: 223 lb (101.2 kg)  Height: 5\' 10"  (1.778 m)    GENERAL: The patient is a well-nourished male, in no acute distress. The vital signs are documented above. CARDIAC: There is a regular rate and rhythm.  VASCULAR: I do not detect carotid bruits. He on the right side he has a palpable femoral pulse.  I cannot palpate pedal pulses. On the left side I cannot palpate a femoral pulse.  I cannot palpate pedal pulses. He has no significant lower extremity swelling. PULMONARY: There is good air exchange bilaterally without wheezing or rales. ABDOMEN: Soft and non-tender with normal pitched bowel sounds.  MUSCULOSKELETAL: There are no major deformities or cyanosis. NEUROLOGIC: No focal weakness or paresthesias are detected. SKIN: There are no ulcers or rashes noted. PSYCHIATRIC: The patient has a normal affect.  DATA:    DUPLEX AORTOILIAC: His aortoiliac duplex shows that the maximum diameter of his infrarenal aorta is 3 cm.  The right common iliac artery measures 3.1 cm in maximum diameter.  The left common iliac artery measures 1.3 cm in maximum diameter.  MEDICAL ISSUES:   3.1 CM RIGHT COMMON ILIAC ARTERY ANEURYSM: His right common iliac artery aneurysm has not changed in size.  He is not a smoker.  His blood pressures been under  good control.  I recommended a follow-up duplex scan in 6 months and I will see him back at that time.  I would only consider repair if this enlarged significantly.  He would likely require open repair given his severe aortoiliac disease.  He has a known left common iliac artery occlusion.   STATUS POST RIGHT CAROTID ENDARTERECTOMY: He will be due for his routine follow-up duplex of his carotids in 6 months so I have scheduled this at the same time as his aortic duplex.  Deitra Mayo [facility] [phone]

## 2018-04-07 ENCOUNTER — Inpatient Hospital Stay: Payer: Medicare Other | Attending: Hematology and Oncology

## 2018-04-07 DIAGNOSIS — Z95828 Presence of other vascular implants and grafts: Secondary | ICD-10-CM

## 2018-04-07 DIAGNOSIS — C8338 Diffuse large B-cell lymphoma, lymph nodes of multiple sites: Secondary | ICD-10-CM | POA: Diagnosis not present

## 2018-04-07 MED ORDER — HEPARIN SOD (PORK) LOCK FLUSH 100 UNIT/ML IV SOLN
250.0000 [IU] | Freq: Once | INTRAVENOUS | Status: AC
  Administered 2018-04-07: 250 [IU]
  Filled 2018-04-07: qty 5

## 2018-04-07 MED ORDER — SODIUM CHLORIDE 0.9% FLUSH
10.0000 mL | Freq: Once | INTRAVENOUS | Status: AC
  Administered 2018-04-07: 10 mL
  Filled 2018-04-07: qty 10

## 2018-04-15 DIAGNOSIS — Z1211 Encounter for screening for malignant neoplasm of colon: Secondary | ICD-10-CM | POA: Diagnosis not present

## 2018-05-19 ENCOUNTER — Inpatient Hospital Stay: Payer: Medicare Other | Attending: Hematology and Oncology

## 2018-05-19 DIAGNOSIS — Z452 Encounter for adjustment and management of vascular access device: Secondary | ICD-10-CM | POA: Diagnosis not present

## 2018-05-19 DIAGNOSIS — C8338 Diffuse large B-cell lymphoma, lymph nodes of multiple sites: Secondary | ICD-10-CM | POA: Insufficient documentation

## 2018-05-19 DIAGNOSIS — Z95828 Presence of other vascular implants and grafts: Secondary | ICD-10-CM

## 2018-05-19 MED ORDER — SODIUM CHLORIDE 0.9% FLUSH
10.0000 mL | Freq: Once | INTRAVENOUS | Status: AC
  Administered 2018-05-19: 10 mL
  Filled 2018-05-19: qty 10

## 2018-05-19 MED ORDER — HEPARIN SOD (PORK) LOCK FLUSH 100 UNIT/ML IV SOLN
500.0000 [IU] | Freq: Once | INTRAVENOUS | Status: AC
  Administered 2018-05-19: 500 [IU]
  Filled 2018-05-19: qty 5

## 2018-06-17 DIAGNOSIS — Z961 Presence of intraocular lens: Secondary | ICD-10-CM | POA: Diagnosis not present

## 2018-06-17 DIAGNOSIS — H5203 Hypermetropia, bilateral: Secondary | ICD-10-CM | POA: Diagnosis not present

## 2018-06-17 DIAGNOSIS — Z85828 Personal history of other malignant neoplasm of skin: Secondary | ICD-10-CM | POA: Diagnosis not present

## 2018-06-17 DIAGNOSIS — E119 Type 2 diabetes mellitus without complications: Secondary | ICD-10-CM | POA: Diagnosis not present

## 2018-06-17 DIAGNOSIS — L821 Other seborrheic keratosis: Secondary | ICD-10-CM | POA: Diagnosis not present

## 2018-06-17 DIAGNOSIS — H52223 Regular astigmatism, bilateral: Secondary | ICD-10-CM | POA: Diagnosis not present

## 2018-06-30 ENCOUNTER — Inpatient Hospital Stay: Payer: Medicare Other | Attending: Hematology and Oncology

## 2018-06-30 ENCOUNTER — Other Ambulatory Visit: Payer: Self-pay

## 2018-06-30 DIAGNOSIS — C8338 Diffuse large B-cell lymphoma, lymph nodes of multiple sites: Secondary | ICD-10-CM | POA: Diagnosis not present

## 2018-06-30 DIAGNOSIS — Z9221 Personal history of antineoplastic chemotherapy: Secondary | ICD-10-CM | POA: Diagnosis not present

## 2018-06-30 DIAGNOSIS — Z95828 Presence of other vascular implants and grafts: Secondary | ICD-10-CM

## 2018-06-30 DIAGNOSIS — Z452 Encounter for adjustment and management of vascular access device: Secondary | ICD-10-CM | POA: Diagnosis not present

## 2018-06-30 MED ORDER — SODIUM CHLORIDE 0.9% FLUSH
10.0000 mL | Freq: Once | INTRAVENOUS | Status: AC
  Administered 2018-06-30: 10 mL
  Filled 2018-06-30: qty 10

## 2018-06-30 MED ORDER — HEPARIN SOD (PORK) LOCK FLUSH 100 UNIT/ML IV SOLN
500.0000 [IU] | Freq: Once | INTRAVENOUS | Status: AC
  Administered 2018-06-30: 500 [IU]
  Filled 2018-06-30: qty 5

## 2018-07-03 ENCOUNTER — Telehealth: Payer: Self-pay

## 2018-07-03 NOTE — Telephone Encounter (Signed)
-----   Message from Heath Lark, MD sent at 07/03/2018  7:55 AM EDT ----- Regarding: appt for 4/28: can he come in earlier at 34

## 2018-07-03 NOTE — Telephone Encounter (Signed)
Called and gave message to wife. She changed appt time to 1030.

## 2018-07-16 ENCOUNTER — Telehealth: Payer: Self-pay

## 2018-07-16 NOTE — Telephone Encounter (Signed)
He called and left a message to call him.  Called back. He is requesting earlier lab appt on 4/27, scan has been moved to [facility].  Scheduling message sent to move lab to 1030 on 4/27.

## 2018-07-28 ENCOUNTER — Ambulatory Visit (HOSPITAL_COMMUNITY)
Admission: RE | Admit: 2018-07-28 | Discharge: 2018-07-28 | Disposition: A | Discharge status: Home / Self Care | Payer: Medicare Other | Source: Ambulatory Visit | Attending: Hematology and Oncology | Admitting: Hematology and Oncology

## 2018-07-28 ENCOUNTER — Ambulatory Visit (HOSPITAL_COMMUNITY): Admission: RE | Admit: 2018-07-28 | Payer: Medicare Other | Source: Ambulatory Visit

## 2018-07-28 ENCOUNTER — Inpatient Hospital Stay: Payer: Medicare Other

## 2018-07-28 ENCOUNTER — Inpatient Hospital Stay: Payer: Medicare Other | Attending: Hematology and Oncology

## 2018-07-28 ENCOUNTER — Other Ambulatory Visit: Payer: Self-pay

## 2018-07-28 DIAGNOSIS — C8338 Diffuse large B-cell lymphoma, lymph nodes of multiple sites: Secondary | ICD-10-CM | POA: Diagnosis not present

## 2018-07-28 DIAGNOSIS — I723 Aneurysm of iliac artery: Secondary | ICD-10-CM | POA: Insufficient documentation

## 2018-07-28 DIAGNOSIS — C8332 Diffuse large B-cell lymphoma, intrathoracic lymph nodes: Secondary | ICD-10-CM | POA: Diagnosis not present

## 2018-07-28 DIAGNOSIS — Z9221 Personal history of antineoplastic chemotherapy: Secondary | ICD-10-CM | POA: Diagnosis not present

## 2018-07-28 DIAGNOSIS — C8333 Diffuse large B-cell lymphoma, intra-abdominal lymph nodes: Secondary | ICD-10-CM | POA: Diagnosis not present

## 2018-07-28 LAB — CMP (CANCER CENTER ONLY)
ALT: 33 U/L (ref 0–44)
AST: 22 U/L (ref 15–41)
Albumin: 4.4 g/dL (ref 3.5–5.0)
Alkaline Phosphatase: 78 U/L (ref 38–126)
Anion gap: 12 (ref 5–15)
BUN: 11 mg/dL (ref 8–23)
CO2: 27 mmol/L (ref 22–32)
Calcium: 9.6 mg/dL (ref 8.9–10.3)
Chloride: 101 mmol/L (ref 98–111)
Creatinine: 0.82 mg/dL (ref 0.61–1.24)
GFR, Est AFR Am: >60 mL/min (ref >60)
GFR, Estimated: >60 mL/min (ref >60)
Glucose, Bld: 116 mg/dL — ABNORMAL HIGH (ref 70–99)
Potassium: 4.4 mmol/L (ref 3.5–5.1)
Sodium: 140 mmol/L (ref 135–145)
Total Bilirubin: 0.5 mg/dL (ref 0.3–1.2)
Total Protein: 7.2 g/dL (ref 6.5–8.1)

## 2018-07-28 LAB — CBC WITH DIFFERENTIAL (CANCER CENTER ONLY)
Abs Immature Granulocytes: 0.05 10*3/uL (ref 0.00–0.07)
Basophils Absolute: 0.1 10*3/uL (ref 0.0–0.1)
Basophils Relative: 1 %
Eosinophils Absolute: 0.5 10*3/uL (ref 0.0–0.5)
Eosinophils Relative: 6 %
HCT: 47.3 % (ref 39.0–52.0)
Hemoglobin: 15.7 g/dL (ref 13.0–17.0)
Immature Granulocytes: 1 %
Lymphocytes Relative: 21 %
Lymphs Abs: 1.8 10*3/uL (ref 0.7–4.0)
MCH: 30.2 pg (ref 26.0–34.0)
MCHC: 33.2 g/dL (ref 30.0–36.0)
MCV: 91 fL (ref 80.0–100.0)
Monocytes Absolute: 1.3 10*3/uL — ABNORMAL HIGH (ref 0.1–1.0)
Monocytes Relative: 14 %
Neutro Abs: 5.2 10*3/uL (ref 1.7–7.7)
Neutrophils Relative %: 57 %
Platelet Count: 201 10*3/uL (ref 150–400)
RBC: 5.2 MIL/uL (ref 4.22–5.81)
RDW: 13.3 % (ref 11.5–15.5)
WBC Count: 8.9 10*3/uL (ref 4.0–10.5)
nRBC: 0 % (ref 0.0–0.2)

## 2018-07-28 LAB — LACTATE DEHYDROGENASE: LDH: 151 U/L (ref 98–192)

## 2018-07-28 MED ORDER — IOHEXOL 300 MG/ML  SOLN
100.0000 mL | Freq: Once | INTRAMUSCULAR | Status: AC | PRN
  Administered 2018-07-28: 100 mL via INTRAVENOUS

## 2018-07-28 MED ORDER — SODIUM CHLORIDE (PF) 0.9 % IJ SOLN
INTRAMUSCULAR | Status: AC
  Filled 2018-07-28: qty 50

## 2018-07-28 MED ORDER — HEPARIN SOD (PORK) LOCK FLUSH 100 UNIT/ML IV SOLN
INTRAVENOUS | Status: AC
  Filled 2018-07-28: qty 5

## 2018-07-28 MED ORDER — HEPARIN SOD (PORK) LOCK FLUSH 100 UNIT/ML IV SOLN
500.0000 [IU] | Freq: Once | INTRAVENOUS | Status: AC
  Administered 2018-07-28: 500 [IU] via INTRAVENOUS

## 2018-07-29 ENCOUNTER — Ambulatory Visit: Payer: Medicare Other | Admitting: Hematology and Oncology

## 2018-07-29 ENCOUNTER — Other Ambulatory Visit: Payer: Self-pay

## 2018-07-29 ENCOUNTER — Inpatient Hospital Stay (HOSPITAL_BASED_OUTPATIENT_CLINIC_OR_DEPARTMENT_OTHER): Payer: Medicare Other | Admitting: Hematology and Oncology

## 2018-07-29 ENCOUNTER — Encounter: Payer: Self-pay | Admitting: Hematology and Oncology

## 2018-07-29 VITALS — BP 129/49 | HR 84 | Temp 98.0°F | Resp 18 | Ht 70.0 in | Wt 220.0 lb

## 2018-07-29 DIAGNOSIS — Z9221 Personal history of antineoplastic chemotherapy: Secondary | ICD-10-CM | POA: Diagnosis not present

## 2018-07-29 DIAGNOSIS — I723 Aneurysm of iliac artery: Secondary | ICD-10-CM | POA: Diagnosis not present

## 2018-07-29 DIAGNOSIS — C8338 Diffuse large B-cell lymphoma, lymph nodes of multiple sites: Secondary | ICD-10-CM

## 2018-07-29 NOTE — Assessment & Plan Note (Signed)
He has no signs of cancer recurrence I will see him back in 6 months for blood work and exam only I will get the port removed

## 2018-07-29 NOTE — Progress Notes (Signed)
[facility] OFFICE PROGRESS NOTE  Patient Care Team: Wenda Low, MD as PCP - General (Internal Medicine)  ASSESSMENT & PLAN:  Diffuse large B-cell lymphoma of lymph nodes of multiple regions [facility]) He has no signs of cancer recurrence I will see him back in 6 months for blood work and exam only I will get the port removed  Aneurysm of iliac artery ([place]) His aneurysm is stable He will continue close follow-up with vascular surgery We discussed risk factor modification  Morbid obesity due to excess calories ([place]) We have extensive discussion about lifestyle modification and risk factor modification He is attempting to lose weight The goal would be to lose 5 more pounds before I see him back   Orders Placed This Encounter  Procedures  . IR REMOVAL TUN ACCESS W/ PORT W/O FL MOD SED    Standing Status:   Future    Standing Expiration Date:   09/28/2019    Order Specific Question:   Reason for exam:    Answer:   no need port    Order Specific Question:   Preferred Imaging Location?    Answer:   [facility]    INTERVAL HISTORY: Please see below for problem oriented charting. He returns for further follow-up He feels well No recent infection, fever or chills No new lymphadenopathy He has lost some weight since last time I saw him  SUMMARY OF ONCOLOGIC HISTORY: Oncology History   NCCN-IPI  Age: 89 points LDH ratio >1: 1 point ECOG >2 at presentation: 1 point [place] Stage III: 1 point  Total 5 points High intermediate risk catergory: OS 38%, PFS 54%     Diffuse large B-cell lymphoma of lymph nodes of multiple regions ([place])   03/07/2015 Imaging    Ct angiogram showed stable aneurysm of the right common iliac artery measuring up to 2.8 cm. Stable ectasia or post stenotic dilatation of the right external iliac artery measuring up to 1.5 cm. Extensive atherosclerotic disease involving the abdominal aorta with a chronic intimal flap or short segment  dissection in the infrarenal abdominal aorta. Chronic occlusion of the left common iliac artery. Slowly enlarging retroperitoneal lymph nodes as described. An indolent neoplastic or lymphoproliferative process cannot be excluded    01/06/2016 Imaging    Ct abdomen and pelvis showed new extensive centrally necrotic retroperitoneal and bilateral pelvic lymphadenopathy. Differential includes lymphoproliferative condition (lymphoma/leukemia) or infectious lymphadenitis (such as due to bacterial, viral or mycobacterial etiologies). Squamous cell carcinoma metastases can have a similar appearance, although this is less likely given the location. Clinical and laboratory evaluation for a lymphoproliferative condition and tissue sampling is advised. 2. Aortic atherosclerosis. Stable 3.1 cm infrarenal abdominal aortic aneurysm    01/27/2016 Imaging    [facility] chest showed no acute cardiopulmonary disease. Known stable retrocrural and periaortic adenopathy. Oval density over the left neck base measuring 2.5 x 4.5 cm likely due partially to prominent internal jugular vein, although cannot exclude adenopathy in this region. Recommend neck CT with contrast. Three vessel atherosclerotic coronary artery disease. Few small thyroid nodules with the largest measuring 1.2 cm over the left lobe. 2.4 cm left renal cyst. Sub cm right renal hypodensity unchanged and too small to characterize but likely a cyst. Minimal cholelithiasis.    01/31/2016 Imaging    ECHO showed: Left ventricle: The cavity size was normal. Systolic function was normal. The estimated ejection fraction was in the range of 60% to 65%. Wall motion was normal; there were no  regional wall motion abnormalities. There was an increased relative contribution of atrial contraction to ventricular filling. Doppler parameters are consistent with abnormal left ventricular relaxation (grade 1 diastolic dysfunction).    02/07/2016 Procedure    He had imaging guided biopsy of  supraclavicular lymph node    02/07/2016 Pathology Results    Accession: [id number] Core biopsies reveal diffuse areas of large atypical lymphoid cells. There are multiple cells with very large irregular nuclei or multinucleated cells. Immunohistochemistry reveals the cells are positive for CD20, CD45, bcl-2, and bcl-6. They are negative for CD10, CD30, and ALK. CD3, CD4, CD5 and CD8 reveal scattered T-cells. Ki-67 reveals an elevated proliferation rate (95%). The cells are negative for cytokeratin 7, cytokeratin 20, TTF-1, CDX-2, HMB45, SOX11, and MelanA. Overall, these findings are consistent with a diffuse large B-cell lymphoma    02/12/2016 - 02/21/2016 Hospital Admission    He was admitted to the hospital with sepsis. He received cycle 1 of chemotherapy will hospitalized    02/14/2016 Imaging    Ct abdomen and pelvis showed widespread retrocrural, retroperitoneal, and bilateral pelvic lymphadenopathy, waxing/waning when compared to recent CT, as above. Mild right hydronephrosis, likely secondary to extrinsic compression by right retroperitoneal lymphadenopathy, with associated diminished enhancement of the right kidney. Spleen is normal in size.    02/16/2016 Bone Marrow Biopsy    Bone marrow biopsy was negative for lymphoma involvement. Cytogenetics came back abnormal: -Y in 45% of cell lines    02/16/2016 Procedure    He had port placement    02/16/2016 - 02/21/2016 Chemotherapy    He received cycle 1 of R-EPOCH     03/08/2016 - 03/12/2016 Hospital Admission    The patient received cycle 2 of chemotherapy in the hospital    03/27/2016 PET scan    Significant decrease in size of abdominal and pelvic lymphadenopathy compared to previous CT, which shows low-grade metabolic activity. No evidence of active lymphoma within the neck or chest. 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 Continued annual follow-up by CT is recommended.     04/03/2016 - 04/07/2016 Hospital Admission    The  patient received cycle 3 of chemotherapy in the hospital    04/20/2016 Procedure    He underwent successful intrathecal injection of chemotherapy without complication    9/41/7408 Pathology Results    [id number] CSF fluid negative for malignant cells    04/24/2016 - 04/28/2016 Hospital Admission    The patient received cycle 4 of chemotherapy in the hospital    05/15/2016 - 05/19/2016 Hospital Admission    He received cycle 5 of chemotherapy in the hospital    05/17/2016 Procedure    Successful intrathecal injection of methotrexate.    06/05/2016 - 06/09/2016 Hospital Admission    He is admitted to the hospital for cycle 6 of chemotherapy    06/07/2016 Procedure    Successful intrathecal chemotherapy injection    06/27/2016 PET scan    Further reduction in size and metabolic activity of lower abdominal and pelvic lymph nodes indicating response to therapy. 2. 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 3. Other imaging findings of potential clinical significance: Mucous retention cyst in the left maxillary sinus. Right mastoid effusion. Left renal cyst. Coronary, aortic arch, and branch vessel atherosclerotic vascular disease. Aortoiliac atherosclerotic vascular disease. Prominent stool throughout the colon favors constipation. Indirect left inguinal hernia containing adipose tissue. Cholelithiasis.    06/28/2016 Procedure    Intrathecal injection of chemotherapy without complication    6/31/4970  PET scan    1. Small LEFT external iliac lymph node is increased metabolic activity compared to prior (similar to liver activity ( [place] 3). No additional hypermetabolic adenopathy in the chest, abdomen, or pelvis. Recommend attention on follow-up. 2. Normal spleen and marrow.    12/19/2016 PET scan    1. No abnormal hypermetabolism in the neck, chest, abdomen or pelvis. 2. Aortic atherosclerosis (ICD10-170.0). Three-vessel coronary artery calcification. 3. Right common iliac artery  aneurysm. 4. Cholelithiasis.    07/08/2017 Imaging    1. No definite findings to suggest recurrent lymphoma in the chest, abdomen or pelvis. 2. Spectrum of findings in the right lung, most evident in the right lower lobe, suggestive of very mild bronchopneumonia. 3. Aortic atherosclerosis, in addition to three-vessel coronary artery disease. Assessment for potential risk factor modification, dietary therapy or pharmacologic therapy may be warranted, if clinically indicated. 4. In addition, there is aneurysmal dilatation of the right common iliac artery which is larger than the prior study, currently measuring 3.0 x 2.8 cm. 5. Left inguinal hernia containing fat. 6. Additional incidental findings, as above.  Aortic Atherosclerosis (ICD10-I70.0).    02/14/2018 Imaging    1. Asymmetric soft tissue swelling with inflammatory stranding involving the right face as above, consistent with acute infection/cellulitis. Cellulitic infections at this location are often odontogenic in origin, and note is made of dehiscence of the alveolar ridge of the right first maxillary molar, which could serve as a source of infection. No discrete abscess or drainable fluid collection identified. 2. Mild asymmetric prominence of subcentimeter right cervical lymph nodes, favored to be reactive in nature. No pathologically enlarged adenopathy within the neck. 3. Chronic right-sided paranasal sinus disease as above.    07/28/2018 Imaging    1. No current findings of active lymphoma. 2. Right common iliac artery aneurysm, 2.8 cm in diameter, surveillance suggested. 3. Other imaging findings of potential clinical significance: Aortic Atherosclerosis (ICD10-I70.0). Coronary atherosclerosis. Left thyroid nodule, present but not hypermetabolic on prior PET-CT hence likely benign. Aortic valve calcification. Gynecomastia. Faint chronic nodularity in the right upper lobe, felt to be benign. Airway thickening is present, suggesting  bronchitis or reactive airways disease. Borderline appearance for constipation. Bilateral direct inguinal hernias contain adipose tissue. Lumbar impingement at several levels due to spondylosis and degenerative disc disease.     REVIEW OF SYSTEMS:   Constitutional: Denies fevers, chills or abnormal weight loss Eyes: Denies blurriness of vision Ears, nose, mouth, throat, and face: Denies mucositis or sore throat Respiratory: Denies cough, dyspnea or wheezes Cardiovascular: Denies palpitation, chest discomfort or lower extremity swelling Gastrointestinal:  Denies nausea, heartburn or change in bowel habits Skin: Denies abnormal skin rashes Lymphatics: Denies new lymphadenopathy or easy bruising Neurological:Denies numbness, tingling or new weaknesses Behavioral/Psych: Mood is stable, no new changes  All other systems were reviewed with the patient and are negative.  I have reviewed the past medical history, past surgical history, social history and family history with the patient and they are unchanged from previous note.  ALLERGIES:  is allergic to bee venom; metformin and related; and sulfa drugs cross reactors.  MEDICATIONS:  Current Outpatient Medications  Medication Sig Dispense Refill  . amLODipine (NORVASC) 10 MG tablet Take 10 mg by mouth every morning.     Marland Kitchen atorvastatin (LIPITOR) 40 MG tablet Take 40 mg by mouth every evening.     . Cholecalciferol (VITAMIN D3) 25 MCG TABS Take 50 mcg by mouth.    . clopidogrel (PLAVIX) 75 MG tablet Take  75 mg by mouth daily.    Marland Kitchen lisinopril (PRINIVIL,ZESTRIL) 5 MG tablet     . metFORMIN (GLUCOPHAGE) 500 MG tablet Take 500 mg by mouth 2 (two) times daily with a meal.     No current facility-administered medications for this visit.     PHYSICAL EXAMINATION: ECOG PERFORMANCE STATUS: 1 - Symptomatic but completely ambulatory  Vitals:   07/29/18 1103  BP: (!) 129/49  Pulse: 84  Resp: 18  Temp: 98 F (36.7 C)  SpO2: 97%   Filed  Weights   07/29/18 1103  Weight: 220 lb (99.8 kg)    GENERAL:alert, no distress and comfortable Musculoskeletal:no cyanosis of digits and no clubbing  NEURO: alert & oriented x 3 with fluent speech, no focal motor/sensory deficits  LABORATORY DATA:  I have reviewed the data as listed    Component Value Date/Time   NA 140 07/28/2018 1057   NA 141 12/18/2016 1403   K 4.4 07/28/2018 1057   K 4.4 12/18/2016 1403   CL 101 07/28/2018 1057   CO2 27 07/28/2018 1057   CO2 28 12/18/2016 1403   GLUCOSE 116 (H) 07/28/2018 1057   GLUCOSE 125 12/18/2016 1403   BUN 11 07/28/2018 1057   BUN 19.3 12/18/2016 1403   CREATININE 0.82 07/28/2018 1057   CREATININE 1.0 12/18/2016 1403   CALCIUM 9.6 07/28/2018 1057   CALCIUM 9.6 12/18/2016 1403   PROT 7.2 07/28/2018 1057   PROT 6.4 12/18/2016 1403   ALBUMIN 4.4 07/28/2018 1057   ALBUMIN 3.9 12/18/2016 1403   AST 22 07/28/2018 1057   AST 16 12/18/2016 1403   ALT 33 07/28/2018 1057   ALT 16 12/18/2016 1403   ALKPHOS 78 07/28/2018 1057   ALKPHOS 66 12/18/2016 1403   BILITOT 0.5 07/28/2018 1057   BILITOT 0.34 12/18/2016 1403   GFRNONAA >60 07/28/2018 1057   GFRAA >60 07/28/2018 1057    No results found for: SPEP, UPEP  Lab Results  Component Value Date   WBC 8.9 07/28/2018   NEUTROABS 5.2 07/28/2018   HGB 15.7 07/28/2018   HCT 47.3 07/28/2018   MCV 91.0 07/28/2018   PLT 201 07/28/2018      Chemistry      Component Value Date/Time   NA 140 07/28/2018 1057   NA 141 12/18/2016 1403   K 4.4 07/28/2018 1057   K 4.4 12/18/2016 1403   CL 101 07/28/2018 1057   CO2 27 07/28/2018 1057   CO2 28 12/18/2016 1403   BUN 11 07/28/2018 1057   BUN 19.3 12/18/2016 1403   CREATININE 0.82 07/28/2018 1057   CREATININE 1.0 12/18/2016 1403      Component Value Date/Time   CALCIUM 9.6 07/28/2018 1057   CALCIUM 9.6 12/18/2016 1403   ALKPHOS 78 07/28/2018 1057   ALKPHOS 66 12/18/2016 1403   AST 22 07/28/2018 1057   AST 16 12/18/2016 1403   ALT  33 07/28/2018 1057   ALT 16 12/18/2016 1403   BILITOT 0.5 07/28/2018 1057   BILITOT 0.34 12/18/2016 1403       RADIOGRAPHIC STUDIES: I have reviewed multiple imaging studies with the patient  I have personally reviewed the radiological images as listed and agreed with the findings in the report. Ct Chest W Contrast  Result Date: 07/28/2018 CLINICAL DATA:  Restaging of large B-cell lymphoma EXAM: CT CHEST, ABDOMEN, AND PELVIS WITH CONTRAST TECHNIQUE: Multidetector CT imaging of the chest, abdomen and pelvis was performed following the standard protocol during bolus administration of intravenous contrast. CONTRAST:  165m OMNIPAQUE IOHEXOL 300 MG/ML  SOLN COMPARISON:  07/08/2017 FINDINGS: CT CHEST FINDINGS Cardiovascular: Coronary, aortic arch, and branch vessel atherosclerotic vascular disease. Right Port-A-Cath tip: Cavoatrial junction. Aortic valve calcifications noted. Mediastinum/Nodes: 1.4 cm hypodense lesion of the left thyroid lobe inferiorly, no change from prior. No pathologic adenopathy noted.  Gynecomastia is present. Lungs/Pleura: Mild biapical pleuroparenchymal scarring. Faint chronic nodularity in the right upper lobe likely sequela from prior infectious or inflammatory process. Mild bilateral airway thickening. Musculoskeletal: Suspected enchondroma in the proximal metaphysis of the right humerus. Mild thoracic kyphosis. CT ABDOMEN PELVIS FINDINGS Hepatobiliary: Unremarkable.  The gallbladder appears normal. Pancreas: Unremarkable Spleen: Unremarkable.  No splenomegaly. Adrenals/Urinary Tract: The adrenal glands appear normal. 3.1 cm fluid density lesion of the left kidney upper pole compatible with cyst. Small hypodense lesion of the right kidney lower pole anteriorly on image 74/2 is technically too small to characterize although statistically likely to be a cyst. Stomach/Bowel: Borderline prominence of stool in the distal colon, otherwise unremarkable. Vascular/Lymphatic: Aortoiliac  atherosclerotic vascular disease. Right common iliac artery aneurysm 2.8 cm in diameter with some mural thrombus. No significant pathologic adenopathy. Reproductive: Unremarkable Other: No supplemental non-categorized findings. Musculoskeletal: Bilateral direct inguinal hernias contain adipose tissue. Lumbar spondylosis and degenerative disc disease resulting in impingement at L3-4 and L4-5, and possibly the right neural foramen at L2-3. IMPRESSION: 1. No current findings of active lymphoma. 2. Right common iliac artery aneurysm, 2.8 cm in diameter, surveillance suggested. 3. Other imaging findings of potential clinical significance: Aortic Atherosclerosis (ICD10-I70.0). Coronary atherosclerosis. Left thyroid nodule, present but not hypermetabolic on prior PET-CT hence likely benign. Aortic valve calcification. Gynecomastia. Faint chronic nodularity in the right upper lobe, felt to be benign. Airway thickening is present, suggesting bronchitis or reactive airways disease. Borderline appearance for constipation. Bilateral direct inguinal hernias contain adipose tissue. Lumbar impingement at several levels due to spondylosis and degenerative disc disease. Electronically Signed   By: WVan ClinesM.D.   On: 07/28/2018 15:56   Ct Abdomen Pelvis W Contrast  Result Date: 07/28/2018 CLINICAL DATA:  Restaging of large B-cell lymphoma EXAM: CT CHEST, ABDOMEN, AND PELVIS WITH CONTRAST TECHNIQUE: Multidetector CT imaging of the chest, abdomen and pelvis was performed following the standard protocol during bolus administration of intravenous contrast. CONTRAST:  1043mOMNIPAQUE IOHEXOL 300 MG/ML  SOLN COMPARISON:  07/08/2017 FINDINGS: CT CHEST FINDINGS Cardiovascular: Coronary, aortic arch, and branch vessel atherosclerotic vascular disease. Right Port-A-Cath tip: Cavoatrial junction. Aortic valve calcifications noted. Mediastinum/Nodes: 1.4 cm hypodense lesion of the left thyroid lobe inferiorly, no change from  prior. No pathologic adenopathy noted.  Gynecomastia is present. Lungs/Pleura: Mild biapical pleuroparenchymal scarring. Faint chronic nodularity in the right upper lobe likely sequela from prior infectious or inflammatory process. Mild bilateral airway thickening. Musculoskeletal: Suspected enchondroma in the proximal metaphysis of the right humerus. Mild thoracic kyphosis. CT ABDOMEN PELVIS FINDINGS Hepatobiliary: Unremarkable.  The gallbladder appears normal. Pancreas: Unremarkable Spleen: Unremarkable.  No splenomegaly. Adrenals/Urinary Tract: The adrenal glands appear normal. 3.1 cm fluid density lesion of the left kidney upper pole compatible with cyst. Small hypodense lesion of the right kidney lower pole anteriorly on image 74/2 is technically too small to characterize although statistically likely to be a cyst. Stomach/Bowel: Borderline prominence of stool in the distal colon, otherwise unremarkable. Vascular/Lymphatic: Aortoiliac atherosclerotic vascular disease. Right common iliac artery aneurysm 2.8 cm in diameter with some mural thrombus. No significant pathologic adenopathy. Reproductive: Unremarkable Other: No supplemental non-categorized findings. Musculoskeletal: Bilateral direct inguinal hernias contain adipose tissue. Lumbar  spondylosis and degenerative disc disease resulting in impingement at L3-4 and L4-5, and possibly the right neural foramen at L2-3. IMPRESSION: 1. No current findings of active lymphoma. 2. Right common iliac artery aneurysm, 2.8 cm in diameter, surveillance suggested. 3. Other imaging findings of potential clinical significance: Aortic Atherosclerosis (ICD10-I70.0). Coronary atherosclerosis. Left thyroid nodule, present but not hypermetabolic on prior PET-CT hence likely benign. Aortic valve calcification. Gynecomastia. Faint chronic nodularity in the right upper lobe, felt to be benign. Airway thickening is present, suggesting bronchitis or reactive airways disease.  Borderline appearance for constipation. Bilateral direct inguinal hernias contain adipose tissue. Lumbar impingement at several levels due to spondylosis and degenerative disc disease. Electronically Signed   By: Van Clines M.D.   On: 07/28/2018 15:56    All questions were answered. The patient knows to call the clinic with any problems, questions or concerns. No barriers to learning was detected.  I spent 15 minutes counseling the patient face to face. The total time spent in the appointment was 20 minutes and more than 50% was on counseling and review of test results  Heath Lark, MD 07/29/2018 11:21 AM

## 2018-07-29 NOTE — Assessment & Plan Note (Signed)
We have extensive discussion about lifestyle modification and risk factor modification He is attempting to lose weight The goal would be to lose 5 more pounds before I see him back

## 2018-07-29 NOTE — Assessment & Plan Note (Signed)
His aneurysm is stable He will continue close follow-up with vascular surgery We discussed risk factor modification

## 2018-08-18 DIAGNOSIS — I10 Essential (primary) hypertension: Secondary | ICD-10-CM | POA: Diagnosis not present

## 2018-08-18 DIAGNOSIS — E7800 Pure hypercholesterolemia, unspecified: Secondary | ICD-10-CM | POA: Diagnosis not present

## 2018-08-18 DIAGNOSIS — I700 Atherosclerosis of aorta: Secondary | ICD-10-CM | POA: Diagnosis not present

## 2018-08-18 DIAGNOSIS — Z7984 Long term (current) use of oral hypoglycemic drugs: Secondary | ICD-10-CM | POA: Diagnosis not present

## 2018-08-18 DIAGNOSIS — I739 Peripheral vascular disease, unspecified: Secondary | ICD-10-CM | POA: Diagnosis not present

## 2018-08-18 DIAGNOSIS — I639 Cerebral infarction, unspecified: Secondary | ICD-10-CM | POA: Diagnosis not present

## 2018-08-18 DIAGNOSIS — J449 Chronic obstructive pulmonary disease, unspecified: Secondary | ICD-10-CM | POA: Diagnosis not present

## 2018-08-18 DIAGNOSIS — C8510 Unspecified B-cell lymphoma, unspecified site: Secondary | ICD-10-CM | POA: Diagnosis not present

## 2018-08-18 DIAGNOSIS — E1151 Type 2 diabetes mellitus with diabetic peripheral angiopathy without gangrene: Secondary | ICD-10-CM | POA: Diagnosis not present

## 2018-08-18 DIAGNOSIS — I723 Aneurysm of iliac artery: Secondary | ICD-10-CM | POA: Diagnosis not present

## 2018-08-27 DIAGNOSIS — Z7984 Long term (current) use of oral hypoglycemic drugs: Secondary | ICD-10-CM | POA: Diagnosis not present

## 2018-08-27 DIAGNOSIS — E1151 Type 2 diabetes mellitus with diabetic peripheral angiopathy without gangrene: Secondary | ICD-10-CM | POA: Diagnosis not present

## 2018-09-08 ENCOUNTER — Ambulatory Visit (HOSPITAL_COMMUNITY): Payer: Medicare Other

## 2018-09-08 ENCOUNTER — Other Ambulatory Visit (HOSPITAL_COMMUNITY): Payer: Medicare Other

## 2018-09-15 ENCOUNTER — Other Ambulatory Visit: Payer: Self-pay | Admitting: Physician Assistant

## 2018-09-16 ENCOUNTER — Encounter (HOSPITAL_COMMUNITY): Payer: Self-pay | Admitting: Interventional Radiology

## 2018-09-16 ENCOUNTER — Other Ambulatory Visit: Payer: Self-pay

## 2018-09-16 ENCOUNTER — Ambulatory Visit (HOSPITAL_COMMUNITY)
Admission: RE | Admit: 2018-09-16 | Discharge: 2018-09-16 | Disposition: A | Discharge status: Home / Self Care | Payer: Medicare Other | Source: Ambulatory Visit | Attending: Hematology and Oncology | Admitting: Hematology and Oncology

## 2018-09-16 DIAGNOSIS — C8338 Diffuse large B-cell lymphoma, lymph nodes of multiple sites: Secondary | ICD-10-CM

## 2018-09-16 DIAGNOSIS — Z452 Encounter for adjustment and management of vascular access device: Secondary | ICD-10-CM | POA: Diagnosis not present

## 2018-09-16 DIAGNOSIS — C8330 Diffuse large B-cell lymphoma, unspecified site: Secondary | ICD-10-CM | POA: Insufficient documentation

## 2018-09-16 DIAGNOSIS — C8510 Unspecified B-cell lymphoma, unspecified site: Secondary | ICD-10-CM | POA: Diagnosis not present

## 2018-09-16 HISTORY — PX: IR REMOVAL TUN ACCESS W/ PORT W/O FL MOD SED: IMG2290

## 2018-09-16 LAB — CBC
HCT: 45.7 % (ref 39.0–52.0)
Hemoglobin: 15.4 g/dL (ref 13.0–17.0)
MCH: 30.9 pg (ref 26.0–34.0)
MCHC: 33.7 g/dL (ref 30.0–36.0)
MCV: 91.6 fL (ref 80.0–100.0)
Platelets: 180 10*3/uL (ref 150–400)
RBC: 4.99 MIL/uL (ref 4.22–5.81)
RDW: 13.4 % (ref 11.5–15.5)
WBC: 9.6 10*3/uL (ref 4.0–10.5)
nRBC: 0 % (ref 0.0–0.2)

## 2018-09-16 LAB — APTT: aPTT: 27 seconds (ref 24–36)

## 2018-09-16 LAB — PROTIME-INR
INR: 0.9 (ref 0.8–1.2)
Prothrombin Time: 11.7 seconds (ref 11.4–15.2)

## 2018-09-16 MED ORDER — CEFAZOLIN SODIUM-DEXTROSE 2-4 GM/100ML-% IV SOLN
2.0000 g | Freq: Once | INTRAVENOUS | Status: AC
  Administered 2018-09-16: 2 g via INTRAVENOUS

## 2018-09-16 MED ORDER — LIDOCAINE HCL 1 % IJ SOLN
INTRAMUSCULAR | Status: DC | PRN
  Administered 2018-09-16: 10 mL

## 2018-09-16 MED ORDER — LIDOCAINE HCL 1 % IJ SOLN
INTRAMUSCULAR | Status: AC
  Filled 2018-09-16: qty 20

## 2018-09-16 MED ORDER — CEFAZOLIN SODIUM-DEXTROSE 2-4 GM/100ML-% IV SOLN
INTRAVENOUS | Status: AC
  Filled 2018-09-16: qty 100

## 2018-09-16 MED ORDER — SODIUM CHLORIDE 0.9 % IV SOLN
INTRAVENOUS | Status: DC
  Administered 2018-09-16: 13:00:00 via INTRAVENOUS

## 2018-09-16 NOTE — Procedures (Signed)
Interventional Radiology Procedure Note  Procedure: Port removal  Complications: None  Estimated Blood Loss: < 10 mL  Findings: Right chest port removed in entirety. Wound closed.  Orlin Kann T. Glennda Weatherholtz, M.D Pager:  [phone]    

## 2018-09-16 NOTE — Progress Notes (Signed)
Patient ID: Samuel Berger, male   DOB: [birth year], 73 y.o.   MRN: [id number] Patient presents to IR department today for Port-A-Cath removal.  He has a history of diffuse large B-cell lymphoma and has completed treatment.  Details/risks of procedure, including but not limited to, internal bleeding, infection, injury to adjacent structures discussed with patient with his understanding and consent. He does not wish to receive IV conscious sedation for the procedure.

## 2018-09-16 NOTE — Discharge Instructions (Signed)
Im[place]ed [place], Care After  This sheet gives you information about how to care for yourself after your procedure. Your health care provider may also give you more specific instructions. If you have problems or questions, contact your health care provider.  What can I expect after the procedure?  After the procedure, it is common to have:   Soreness or pain near your incision.   Some swelling or bruising near your incision.  Follow these instructions at home:  Medicines   Take over-the-counter and prescription medicines only as told by your health care provider.   If you were prescribed an antibiotic medicine, take it as told by your health care provider. Do not stop taking the antibiotic even if you start to feel better.  Bathing   Do not take baths, swim, or use a hot tub until your health care provider approves. Ask your health care provider if you can take showers. You may only be allowed to take sponge baths.  Incision care     Follow instructions from your health care provider about how to take care of your incision. Make sure you:  ? Wash your hands with soap and water before you change your bandage (dressing). If soap and water are not available, use hand sanitizer.  ? Change your dressing as told by your health care provider.  ? Keep your dressing dry.  ? Leave stitches (sutures), skin glue, or adhesive strips in place. These skin closures may need to stay in place for 2 weeks or longer. If adhesive strip edges start to loosen and curl up, you may trim the loose edges. Do not remove adhesive strips completely unless your health care provider tells you to do that.   Check your incision area every day for signs of infection. Check for:  ? More redness, swelling, or pain.  ? More fluid or blood.  ? Warmth.  ? Pus or a bad smell.  Driving     Do not drive for 24 hours if you were given a medicine to help you relax (sedative) during your procedure.   If you did not receive a sedative, ask  your health care provider when it is safe to drive.  Activity   Return to your normal activities as told by your health care provider. Ask your health care provider what activities are safe for you.   Do not lift anything that is heavier than 10 lb (4.5 kg), or the limit that you are told, until your health care provider says that it is safe.   Do not do activities that involve lifting your arms over your head.  General instructions   Do not use any products that contain nicotine or tobacco, such as cigarettes and e-cigarettes. These can delay healing. If you need help quitting, ask your health care provider.   Keep all follow-up visits as told by your health care provider. This is important.  Contact a health care provider if:   You have more redness, swelling, or pain around your incision.   You have more fluid or blood coming from your incision.   Your incision feels warm to the touch.   You have pus or a bad smell coming from your incision.   You have pain that is not relieved by your pain medicine.  Get help right away if you have:   A fever or chills.   Chest pain.   Difficulty breathing.  Summary   After the procedure, it is common to   have pain, soreness, swelling, or bruising near your incision.   If you were prescribed an antibiotic medicine, take it as told by your health care provider. Do not stop taking the antibiotic even if you start to feel better.   Do not drive for 24 hours if you were given a sedative during your procedure.   Return to your normal activities as told by your health care provider. Ask your health care provider what activities are safe for you.  This information is not intended to replace advice given to you by your health care provider. Make sure you discuss any questions you have with your health care provider.  Document Released: 02/28/2015 Document Revised: 05/02/2017 Document Reviewed: 05/02/2017  Elsevier Interactive Patient Education  2019 [employer]

## 2018-09-16 NOTE — Progress Notes (Signed)
Confirmed with pt he is receiving no sedation today.  He drove himself and will drive home after the procedure is complete.

## 2018-09-18 ENCOUNTER — Other Ambulatory Visit: Payer: Self-pay

## 2018-09-18 DIAGNOSIS — I714 Abdominal aortic aneurysm, without rupture, unspecified: Secondary | ICD-10-CM

## 2018-09-18 DIAGNOSIS — I739 Peripheral vascular disease, unspecified: Secondary | ICD-10-CM

## 2018-09-18 DIAGNOSIS — I723 Aneurysm of iliac artery: Secondary | ICD-10-CM

## 2018-09-18 DIAGNOSIS — I6523 Occlusion and stenosis of bilateral carotid arteries: Secondary | ICD-10-CM

## 2018-09-24 ENCOUNTER — Ambulatory Visit (HOSPITAL_COMMUNITY)
Admission: RE | Admit: 2018-09-24 | Discharge: 2018-09-24 | Disposition: A | Discharge status: Home / Self Care | Payer: Medicare Other | Source: Ambulatory Visit | Attending: Family | Admitting: Family

## 2018-09-24 ENCOUNTER — Ambulatory Visit (INDEPENDENT_AMBULATORY_CARE_PROVIDER_SITE_OTHER)
Admission: RE | Admit: 2018-09-24 | Discharge: 2018-09-24 | Disposition: A | Discharge status: Home / Self Care | Payer: Medicare Other | Source: Ambulatory Visit | Attending: Family | Admitting: Family

## 2018-09-24 ENCOUNTER — Ambulatory Visit (INDEPENDENT_AMBULATORY_CARE_PROVIDER_SITE_OTHER): Payer: Medicare Other | Admitting: Vascular Surgery

## 2018-09-24 ENCOUNTER — Other Ambulatory Visit: Payer: Self-pay

## 2018-09-24 ENCOUNTER — Encounter: Payer: Self-pay | Admitting: Vascular Surgery

## 2018-09-24 VITALS — BP 141/71 | HR 77 | Temp 97.7°F | Resp 20 | Ht 70.0 in | Wt 219.0 lb

## 2018-09-24 DIAGNOSIS — I739 Peripheral vascular disease, unspecified: Secondary | ICD-10-CM | POA: Insufficient documentation

## 2018-09-24 DIAGNOSIS — I723 Aneurysm of iliac artery: Secondary | ICD-10-CM

## 2018-09-24 DIAGNOSIS — I714 Abdominal aortic aneurysm, without rupture, unspecified: Secondary | ICD-10-CM

## 2018-09-24 DIAGNOSIS — I6523 Occlusion and stenosis of bilateral carotid arteries: Secondary | ICD-10-CM

## 2018-09-24 DIAGNOSIS — I6521 Occlusion and stenosis of right carotid artery: Secondary | ICD-10-CM | POA: Diagnosis not present

## 2018-09-24 NOTE — Progress Notes (Signed)
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  HPI:   Samuel Berger is a pleasant 73 y.o. male who I last saw on 03/19/2018.  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  At the time of his last visit duplex scan showed that the infrarenal aorta measured 3 cm in maximum diameter.  The right common iliac artery measured 3.1 cm in maximum diameter.  The left common iliac artery measured 1.3 cm in maximum diameter.   Since I saw him last, he denies any abdominal pain or back pain.  He had been treated for lymphoma in the past but has gotten good reports and is doing well from that standpoint.  Patient has had previous right carotid endarterectomy in 2013 by Dr. Kellie Simmering.  He has had no history of stroke, TIAs, expressive or receptive aphasia, or amaurosis fugax.  He is on Plavix and is on a statin.  There have otherwise been no significant changes to his medical history.  Past Medical History:  Diagnosis Date  . Aneurysm artery, iliac ([place])   . Carotid artery occlusion   . Dyslipidemia   . GERD (gastroesophageal reflux disease)   . Hepatitis    ? age 9 or 89  . Hyperlipidemia   . Hypertension   . Iliac artery aneurysm, right ([place])   . large b cell lymphoma dx'd 02/2016  . Stroke ([place])    01-05-2012   [phone]  . Type II or unspecified type diabetes mellitus without mention of complication, not stated as uncontrolled     Family History  Problem Relation Age of Onset  . Diabetes Mother   . Lung cancer Father     SOCIAL HISTORY: Social History   Tobacco Use  . Smoking status: Former Smoker    Packs/day: 0.50    Years: 30.00    Pack years: 15.00    Types: Cigars    Quit date: 01/05/2012    Years since quitting: 6.7  . Smokeless tobacco: Former Network engineer Use  Topics  . Alcohol use: Yes    Alcohol/week: 3.0 standard drinks    Types: 1 Glasses of wine, 1 Cans of beer, 1 Shots of liquor per week    Comment: quit 2 months..Patient drinks coffee daily.OCCASIONAL DRINKER    Allergies  Allergen Reactions  . Bee Venom Hives  . Sulfa Drugs Cross Reactors Other (See Comments)    Unknown    Current Outpatient Medications  Medication Sig Dispense Refill  . amLODipine (NORVASC) 10 MG tablet Take 10 mg by mouth every morning.     Marland Kitchen atorvastatin (LIPITOR) 40 MG tablet Take 40 mg by mouth every evening.     . Cholecalciferol (VITAMIN D3) 25 MCG TABS Take 50 mcg by mouth.    . clopidogrel (PLAVIX) 75 MG tablet Take 75 mg by mouth daily.    Marland Kitchen lisinopril (PRINIVIL,ZESTRIL) 5 MG tablet daily.     . metFORMIN (GLUCOPHAGE) 500 MG tablet Take 500 mg by mouth 2 (two) times daily with a meal.     No current facility-administered medications for this visit.     REVIEW OF SYSTEMS:  [X]  denotes positive finding, [ ]  denotes negative finding Cardiac  Comments:  Chest pain or chest pressure:    Shortness of breath upon exertion:  Short of breath when lying flat:    Irregular heart rhythm:        Vascular    Pain in calf, thigh, or hip brought on by ambulation:    Pain in feet at night that wakes you up from your sleep:     Blood clot in your veins:    Leg swelling:         Pulmonary    Oxygen at home:    Productive cough:     Wheezing:         Neurologic    Sudden weakness in arms or legs:     Sudden numbness in arms or legs:     Sudden onset of difficulty speaking or slurred speech:    Temporary loss of vision in one eye:     Problems with dizziness:         Gastrointestinal    Blood in stool:     Vomited blood:         Genitourinary    Burning when urinating:     Blood in urine:        Psychiatric    Major depression:         Hematologic    Bleeding problems:    Problems with blood clotting too easily:        Skin    Rashes or  ulcers:        Constitutional    Fever or chills:     PHYSICAL EXAM:   Vitals:   09/24/18 1033 09/24/18 1052  BP: (!) 142/71 (!) 141/71  Pulse: 77   Resp: 20   Temp: 97.7 F (36.5 C)   SpO2: 96%   Weight: 219 lb (99.3 kg)   Height: 5\' 10"  (1.778 m)    Body mass index is 31.42 kg/m.   GENERAL: The patient is a well-nourished male, in no acute distress. The vital signs are documented above. CARDIAC: There is a regular rate and rhythm.  VASCULAR: I do not detect carotid bruits. He has palpable femoral pulses. Both feet are warm and well-perfused. He has no significant lower extremity swelling. It is difficult to assess his aneurysm because of his size. PULMONARY: There is good air exchange bilaterally without wheezing or rales. ABDOMEN: Soft and non-tender with normal pitched bowel sounds.  MUSCULOSKELETAL: There are no major deformities or cyanosis. NEUROLOGIC: No focal weakness or paresthesias are detected. SKIN: There are no ulcers or rashes noted. PSYCHIATRIC: The patient has a normal affect.  DATA:    DUPLEX ABDOMINAL AORTA: I have independently interpreted the duplex of the abdominal aorta.  The maximum diameter of the aorta is noted to be 2.4 cm.  The right common iliac artery measures 3.1 cm in maximum diameter.  Thus there is been no significant change since the previous study in December.  CAROTID DUPLEX: I have independently interpreted the carotid duplex scan today.  On the right side where he had previous right carotid endarterectomy, the right carotid endarterectomy site is widely patent without evidence of restenosis.  On the left side there is no significant stenosis noted.    Both vertebral arteries are patent with antegrade flow.  CT ABDOMEN PELVIS: This patient did have a CT abdomen pelvis on 07/28/2018 which showed that the diameter of the aneurysm was 2.8 cm.  This was for a follow-up study of lymphoma there was no evidence of recurrent lymphoma.   MEDICAL ISSUES:   3.1 CM RIGHT COMMON ILIAC ARTERY ANEURYSM: This  patient's right common iliac artery aneurysm has been stable in size.  I explained that we would likely not consider elective repair unless her enlarged to 3-1/2 to 4 cm.  Based on my review of the CT scan I think it could potentially be addressed from an endovascular option.  He does have some calcific disease in his aorta and iliac arteries.  We might potentially have to embolize the right hypogastric artery and extend down to the external on the right.  He would obviously need preoperative arteriography.  Fortunately he is not a smoker.  He quit many years ago.  His blood pressures been under good control.  I ordered a follow-up duplex in 1 year and I will see him at that time.  STATUS POST RIGHT CAROTID ENDARTERECTOMY: The patient has no evidence of recurrent disease on the right and has no disease on the left.  I have ordered a follow-up carotid duplex scan in 1 year and I will see him at that time.  He is on Plavix and is on a statin.  He is not a smoker.  Deitra Mayo [facility] [phone]

## 2019-01-07 DIAGNOSIS — Z23 Encounter for immunization: Secondary | ICD-10-CM | POA: Diagnosis not present

## 2019-01-30 ENCOUNTER — Inpatient Hospital Stay: Payer: Medicare Other | Attending: Hematology and Oncology

## 2019-01-30 ENCOUNTER — Other Ambulatory Visit: Payer: Self-pay

## 2019-01-30 ENCOUNTER — Inpatient Hospital Stay (HOSPITAL_BASED_OUTPATIENT_CLINIC_OR_DEPARTMENT_OTHER): Payer: Medicare Other | Admitting: Hematology and Oncology

## 2019-01-30 ENCOUNTER — Encounter: Payer: Self-pay | Admitting: Hematology and Oncology

## 2019-01-30 ENCOUNTER — Telehealth: Payer: Self-pay | Admitting: Hematology and Oncology

## 2019-01-30 DIAGNOSIS — Z79899 Other long term (current) drug therapy: Secondary | ICD-10-CM | POA: Insufficient documentation

## 2019-01-30 DIAGNOSIS — Z9221 Personal history of antineoplastic chemotherapy: Secondary | ICD-10-CM | POA: Diagnosis not present

## 2019-01-30 DIAGNOSIS — I6523 Occlusion and stenosis of bilateral carotid arteries: Secondary | ICD-10-CM

## 2019-01-30 DIAGNOSIS — Z7984 Long term (current) use of oral hypoglycemic drugs: Secondary | ICD-10-CM | POA: Diagnosis not present

## 2019-01-30 DIAGNOSIS — C8338 Diffuse large B-cell lymphoma, lymph nodes of multiple sites: Secondary | ICD-10-CM

## 2019-01-30 LAB — COMPREHENSIVE METABOLIC PANEL
ALT: 18 U/L (ref 0–44)
AST: 15 U/L (ref 15–41)
Albumin: 4.1 g/dL (ref 3.5–5.0)
Alkaline Phosphatase: 54 U/L (ref 38–126)
Anion gap: 11 (ref 5–15)
BUN: 16 mg/dL (ref 8–23)
CO2: 26 mmol/L (ref 22–32)
Calcium: 9.3 mg/dL (ref 8.9–10.3)
Chloride: 103 mmol/L (ref 98–111)
Creatinine, Ser: 0.8 mg/dL (ref 0.61–1.24)
GFR calc Af Amer: >60 mL/min (ref >60)
GFR calc non Af Amer: >60 mL/min (ref >60)
Glucose, Bld: 119 mg/dL — ABNORMAL HIGH (ref 70–99)
Potassium: 4.2 mmol/L (ref 3.5–5.1)
Sodium: 140 mmol/L (ref 135–145)
Total Bilirubin: 0.6 mg/dL (ref 0.3–1.2)
Total Protein: 6.6 g/dL (ref 6.5–8.1)

## 2019-01-30 LAB — CBC WITH DIFFERENTIAL/PLATELET
Abs Immature Granulocytes: 0.02 10*3/uL (ref 0.00–0.07)
Basophils Absolute: 0.1 10*3/uL (ref 0.0–0.1)
Basophils Relative: 1 %
Eosinophils Absolute: 0.5 10*3/uL (ref 0.0–0.5)
Eosinophils Relative: 7 %
HCT: 44.8 % (ref 39.0–52.0)
Hemoglobin: 15.4 g/dL (ref 13.0–17.0)
Immature Granulocytes: 0 %
Lymphocytes Relative: 23 %
Lymphs Abs: 1.8 10*3/uL (ref 0.7–4.0)
MCH: 30.3 pg (ref 26.0–34.0)
MCHC: 34.4 g/dL (ref 30.0–36.0)
MCV: 88.2 fL (ref 80.0–100.0)
Monocytes Absolute: 1.2 10*3/uL — ABNORMAL HIGH (ref 0.1–1.0)
Monocytes Relative: 16 %
Neutro Abs: 4.1 10*3/uL (ref 1.7–7.7)
Neutrophils Relative %: 53 %
Platelets: 172 10*3/uL (ref 150–400)
RBC: 5.08 MIL/uL (ref 4.22–5.81)
RDW: 13.2 % (ref 11.5–15.5)
WBC: 7.9 10*3/uL (ref 4.0–10.5)
nRBC: 0 % (ref 0.0–0.2)

## 2019-01-30 NOTE — Assessment & Plan Note (Signed)
He has no signs or symptoms to suggest cancer recurrence The patient is 2-1/2 years out from treatment Based on current guidelines, I do not recommend surveillance imaging study I will follow him clinically in about 9 months with history, physical examination and blood work only

## 2019-01-30 NOTE — Telephone Encounter (Signed)
Scheduled appt per 10/30 sch message - mailed reminder letter with appt date and time

## 2019-01-30 NOTE — Assessment & Plan Note (Signed)
The patient is motivated and has lost some weight since last time I saw him I continue to encourage him to modify diet along with lifestyle modification He is planning to get his weight down to about 200 pounds by his next visit

## 2019-01-30 NOTE — Progress Notes (Signed)
[place] OFFICE PROGRESS NOTE  Patient Care Team: Wenda Low, MD as PCP - General (Internal Medicine)  ASSESSMENT & PLAN:  Diffuse large B-cell lymphoma of lymph nodes of multiple regions [facility] [place]) He has no signs or symptoms to suggest cancer recurrence The patient is 2-1/2 years out from treatment Based on current guidelines, I do not recommend surveillance imaging study I will follow him clinically in about 9 months with history, physical examination and blood work only  Morbid obesity due to excess calories [facility]) The patient is motivated and has lost some weight since last time I saw him I continue to encourage him to modify diet along with lifestyle modification He is planning to get his weight down to about 200 pounds by his next visit   No orders of the defined types were placed in this encounter.   INTERVAL HISTORY: Please see below for problem oriented charting. 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 Stable ectasia or post stenotic dilatation of the right external iliac artery measuring up to 1.5 cm. Extensive atherosclerotic disease involving the abdominal aorta with a chronic intimal flap or short segment dissection in the infrarenal abdominal aorta. Chronic occlusion of the left common iliac artery. Slowly enlarging retroperitoneal lymph nodes as  described. An indolent neoplastic or lymphoproliferative process cannot be excluded   01/06/2016 Imaging   Ct abdomen and pelvis showed new extensive centrally necrotic retroperitoneal and bilateral pelvic lymphadenopathy. Differential includes lymphoproliferative condition (lymphoma/leukemia) or infectious lymphadenitis (such as due to bacterial, viral or mycobacterial etiologies). Squamous cell carcinoma metastases can have a similar appearance, although this is less likely given the location. Clinical and laboratory evaluation for a lymphoproliferative condition and tissue sampling is advised. 2. Aortic atherosclerosis. Stable 3.1 cm infrarenal abdominal aortic aneurysm   01/27/2016 Imaging   CH chest showed no acute cardiopulmonary disease. Known stable retrocrural and periaortic adenopathy. Oval density over the left neck base measuring 2.5 x 4.5 cm likely due partially to prominent internal jugular vein, although cannot exclude adenopathy in this region. Recommend neck CT with contrast. Three vessel atherosclerotic coronary artery disease. Few small thyroid nodules with the largest measuring 1.2 cm over the left lobe. 2.4 cm left renal cyst. Sub cm right renal hypodensity unchanged and too small to characterize but likely a cyst. Minimal cholelithiasis.   01/31/2016 Imaging   ECHO showed: Left ventricle: The cavity size was normal. Systolic function was normal. The estimated ejection fraction was in the range of 60% to 65%. Wall motion was normal; there were no regional wall motion abnormalities. There was an increased relative contribution of atrial contraction to ventricular filling. Doppler parameters are consistent with abnormal left ventricular relaxation (grade 1 diastolic dysfunction).   02/07/2016 Procedure   He had imaging guided biopsy of supraclavicular lymph node   02/07/2016 Pathology Results   Accession: [id number] Core biopsies reveal diffuse areas of large  atypical lymphoid cells.  There are multiple cells with very large irregular nuclei or multinucleated cells. Immunohistochemistry reveals the cells are positive for CD20, CD45, bcl-2, and bcl-6. They are negative for CD10, CD30, and ALK. CD3, CD4, CD5 and CD8 reveal scattered T-cells. Ki-67 reveals an elevated proliferation rate (95%). The cells are negative for cytokeratin 7, cytokeratin 20, TTF-1, CDX-2, HMB45, SOX11, and MelanA. 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 He received cycle 1 of chemotherapy will hospitalized   02/14/2016 Imaging   Ct abdomen and pelvis showed widespread retrocrural, retroperitoneal, and bilateral pelvic lymphadenopathy, waxing/waning when compared to recent CT, as above. Mild right hydronephrosis, likely secondary to extrinsic compression by right retroperitoneal lymphadenopathy, with associated diminished enhancement of the right kidney. Spleen is normal in size.   02/16/2016 Bone Marrow Biopsy   Bone marrow biopsy was negative for lymphoma involvement. Cytogenetics came back abnormal: -Y in 45% of cell lines   02/16/2016 Procedure   He had port placement   02/16/2016 - 02/21/2016 Chemotherapy   He received cycle 1 of R-EPOCH    03/08/2016 - 03/12/2016 Hospital Admission   The patient received cycle 2 of chemotherapy in the hospital   03/27/2016 PET scan   Significant decrease in size of abdominal and pelvic lymphadenopathy compared to previous CT, which shows low-grade metabolic activity. No evidence of active lymphoma within the neck or chest. 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 Continued annual follow-up by CT is recommended.    04/03/2016 - 04/07/2016 Hospital Admission   The patient received cycle 3 of chemotherapy in the hospital   04/20/2016 Procedure   He underwent successful intrathecal injection of chemotherapy without complication    1/47/0929 Pathology Results   [id number] CSF fluid negative for malignant cells   04/24/2016 - 04/28/2016 Hospital Admission   The patient received cycle 4 of chemotherapy in the hospital   05/15/2016 - 05/19/2016 Hospital Admission   He received cycle 5 of chemotherapy in the hospital   05/17/2016 Procedure   Successful intrathecal injection of methotrexate.   06/05/2016 - 06/09/2016 Hospital Admission   He is admitted to the hospital for cycle 6 of chemotherapy   06/07/2016 Procedure   Successful intrathecal chemotherapy injection   06/27/2016 PET scan   Further reduction in size and metabolic activity of lower abdominal and pelvic lymph nodes indicating response to therapy. 2. 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 3. Other imaging findings of potential clinical significance: Mucous retention cyst in the left maxillary sinus. Right mastoid effusion. Left renal cyst. Coronary, aortic arch, and branch vessel atherosclerotic vascular disease. Aortoiliac atherosclerotic vascular disease. Prominent stool throughout the colon favors constipation. Indirect left inguinal hernia containing adipose tissue. Cholelithiasis.   06/28/2016 Procedure   Intrathecal injection of chemotherapy without complication   3/70/9643 PET scan   1. Small LEFT external iliac lymph node is increased metabolic activity compared to prior (similar to liver activity ( [place] 3). No additional hypermetabolic adenopathy in the chest, abdomen, or pelvis. Recommend attention on follow-up. 2. Normal spleen and marrow.   12/19/2016 PET scan   1. No abnormal hypermetabolism in the neck, chest, abdomen or pelvis. 2. Aortic atherosclerosis (ICD10-170.0). Three-vessel coronary artery calcification. 3. Right common iliac artery aneurysm. 4. Cholelithiasis.   07/08/2017 Imaging   1. No definite findings to suggest recurrent lymphoma in the chest, abdomen or pelvis. 2.  Spectrum of findings in the right lung, most  evident in the right lower lobe, suggestive of very mild bronchopneumonia. 3. Aortic atherosclerosis, in addition to three-vessel coronary artery disease. Assessment for potential risk factor modification, dietary therapy or pharmacologic therapy may be warranted, if clinically indicated. 4. In addition, there is aneurysmal dilatation of the right common iliac artery which is larger than the prior study, currently measuring 3.0 x 2.8 cm. 5. Left inguinal hernia containing fat. 6. Additional incidental findings, as above.  Aortic Atherosclerosis (ICD10-I70.0).   02/14/2018 Imaging   1. Asymmetric soft tissue swelling with inflammatory stranding involving the right face as above, consistent with acute infection/cellulitis. Cellulitic infections at this location are often odontogenic in origin, and note is made of dehiscence of the alveolar ridge of the right first maxillary molar, which could serve as a source of infection. No discrete abscess or drainable fluid collection identified. 2. Mild asymmetric prominence of subcentimeter right cervical lymph nodes, favored to be reactive in nature. No pathologically enlarged adenopathy within the neck. 3. Chronic right-sided paranasal sinus disease as above.   07/28/2018 Imaging   1. No current findings of active lymphoma. 2. Right common iliac artery aneurysm, 2.8 cm in diameter, surveillance suggested. 3. Other imaging findings of potential clinical significance: Aortic Atherosclerosis (ICD10-I70.0). Coronary atherosclerosis. Left thyroid nodule, present but not hypermetabolic on prior PET-CT hence likely benign. Aortic valve calcification. Gynecomastia. Faint chronic nodularity in the right upper lobe, felt to be benign. Airway thickening is present, suggesting bronchitis or reactive airways disease. Borderline appearance for constipation. Bilateral direct inguinal hernias contain adipose tissue. Lumbar impingement at several levels due to spondylosis and  degenerative disc disease.   09/16/2018 Procedure   Removal of implanted Port-A-Cath utilizing sharp and blunt dissection. The procedure was uncomplicated.     REVIEW OF SYSTEMS:   Constitutional: Denies fevers, chills or abnormal weight loss Eyes: Denies blurriness of vision Ears, nose, mouth, throat, and face: Denies mucositis or sore throat Respiratory: Denies cough, dyspnea or wheezes Cardiovascular: Denies palpitation, chest discomfort or lower extremity swelling Gastrointestinal:  Denies nausea, heartburn or change in bowel habits Skin: Denies abnormal skin rashes Lymphatics: Denies new lymphadenopathy or easy bruising Neurological:Denies numbness, tingling or new weaknesses Behavioral/Psych: Mood is stable, no new changes  All other systems were reviewed with the patient and are negative.  I have reviewed the past medical history, past surgical history, social history and family history with the patient and they are unchanged from previous note.  ALLERGIES:  is allergic to bee venom and sulfa drugs cross reactors.  MEDICATIONS:  Current Outpatient Medications  Medication Sig Dispense Refill  . amLODipine (NORVASC) 10 MG tablet Take 10 mg by mouth every morning.     Marland Kitchen atorvastatin (LIPITOR) 40 MG tablet Take 40 mg by mouth every evening.     . Cholecalciferol (VITAMIN D3) 25 MCG TABS Take 50 mcg by mouth.    . clopidogrel (PLAVIX) 75 MG tablet Take 75 mg by mouth daily.    Marland Kitchen lisinopril (PRINIVIL,ZESTRIL) 5 MG tablet daily.     . metFORMIN (GLUCOPHAGE) 500 MG tablet Take 500 mg by mouth 2 (two) times daily with a meal.     No current facility-administered medications for this visit.     PHYSICAL EXAMINATION: ECOG PERFORMANCE STATUS: 0 - Asymptomatic  Vitals:   01/30/19 1057  BP: (!) 145/65  Pulse: 75  Resp: 18  Temp: 98.7 F (37.1 C)  SpO2: 97%   Filed Weights   01/30/19  1057  Weight: 214 lb (97.1 kg)    GENERAL:alert, no distress and comfortable SKIN: skin  color, texture, turgor are normal, no rashes or significant lesions EYES: normal, Conjunctiva are pink and non-injected, sclera clear OROPHARYNX:no exudate, no erythema and lips, buccal mucosa, and tongue normal  NECK: supple, thyroid normal size, non-tender, without nodularity LYMPH:  no palpable lymphadenopathy in the cervical, axillary or inguinal LUNGS: clear to auscultation and percussion with normal breathing effort HEART: regular rate & rhythm and no murmurs and no lower extremity edema ABDOMEN:abdomen soft, non-tender and normal bowel sounds Musculoskeletal:no cyanosis of digits and no clubbing  NEURO: alert & oriented x 3 with fluent speech, no focal motor/sensory deficits  LABORATORY DATA:  I have reviewed the data as listed    Component Value Date/Time   NA 140 01/30/2019 1034   NA 141 12/18/2016 1403   K 4.2 01/30/2019 1034   K 4.4 12/18/2016 1403   CL 103 01/30/2019 1034   CO2 26 01/30/2019 1034   CO2 28 12/18/2016 1403   GLUCOSE 119 (H) 01/30/2019 1034   GLUCOSE 125 12/18/2016 1403   BUN 16 01/30/2019 1034   BUN 19.3 12/18/2016 1403   CREATININE 0.80 01/30/2019 1034   CREATININE 0.82 07/28/2018 1057   CREATININE 1.0 12/18/2016 1403   CALCIUM 9.3 01/30/2019 1034   CALCIUM 9.6 12/18/2016 1403   PROT 6.6 01/30/2019 1034   PROT 6.4 12/18/2016 1403   ALBUMIN 4.1 01/30/2019 1034   ALBUMIN 3.9 12/18/2016 1403   AST 15 01/30/2019 1034   AST 22 07/28/2018 1057   AST 16 12/18/2016 1403   ALT 18 01/30/2019 1034   ALT 33 07/28/2018 1057   ALT 16 12/18/2016 1403   ALKPHOS 54 01/30/2019 1034   ALKPHOS 66 12/18/2016 1403   BILITOT 0.6 01/30/2019 1034   BILITOT 0.5 07/28/2018 1057   BILITOT 0.34 12/18/2016 1403   GFRNONAA >60 01/30/2019 1034   GFRNONAA >60 07/28/2018 1057   GFRAA >60 01/30/2019 1034   GFRAA >60 07/28/2018 1057    No results found for: SPEP, UPEP  Lab Results  Component Value Date   WBC 7.9 01/30/2019   NEUTROABS 4.1 01/30/2019   HGB 15.4  01/30/2019   HCT 44.8 01/30/2019   MCV 88.2 01/30/2019   PLT 172 01/30/2019      Chemistry      Component Value Date/Time   NA 140 01/30/2019 1034   NA 141 12/18/2016 1403   K 4.2 01/30/2019 1034   K 4.4 12/18/2016 1403   CL 103 01/30/2019 1034   CO2 26 01/30/2019 1034   CO2 28 12/18/2016 1403   BUN 16 01/30/2019 1034   BUN 19.3 12/18/2016 1403   CREATININE 0.80 01/30/2019 1034   CREATININE 0.82 07/28/2018 1057   CREATININE 1.0 12/18/2016 1403      Component Value Date/Time   CALCIUM 9.3 01/30/2019 1034   CALCIUM 9.6 12/18/2016 1403   ALKPHOS 54 01/30/2019 1034   ALKPHOS 66 12/18/2016 1403   AST 15 01/30/2019 1034   AST 22 07/28/2018 1057   AST 16 12/18/2016 1403   ALT 18 01/30/2019 1034   ALT 33 07/28/2018 1057   ALT 16 12/18/2016 1403   BILITOT 0.6 01/30/2019 1034   BILITOT 0.5 07/28/2018 1057   BILITOT 0.34 12/18/2016 1403      All questions were answered. The patient knows to call the clinic with any problems, questions or concerns. No barriers to learning was detected.  I spent 10 minutes counseling  the patient face to face. The total time spent in the appointment was 15 minutes and more than 50% was on counseling and review of test results  Heath Lark, MD 01/30/2019 11:54 AM

## 2019-02-06 ENCOUNTER — Telehealth: Payer: Self-pay | Admitting: Hematology and Oncology

## 2019-02-06 NOTE — Telephone Encounter (Signed)
Returned patient's phone call regarding rescheduling an appointment, per patient's request 07/06 appointment has been moved to 07/09.

## 2019-03-04 DIAGNOSIS — I700 Atherosclerosis of aorta: Secondary | ICD-10-CM | POA: Diagnosis not present

## 2019-03-04 DIAGNOSIS — I639 Cerebral infarction, unspecified: Secondary | ICD-10-CM | POA: Diagnosis not present

## 2019-03-04 DIAGNOSIS — I10 Essential (primary) hypertension: Secondary | ICD-10-CM | POA: Diagnosis not present

## 2019-03-04 DIAGNOSIS — Z23 Encounter for immunization: Secondary | ICD-10-CM | POA: Diagnosis not present

## 2019-03-04 DIAGNOSIS — Z0000 Encounter for general adult medical examination without abnormal findings: Secondary | ICD-10-CM | POA: Diagnosis not present

## 2019-03-04 DIAGNOSIS — I723 Aneurysm of iliac artery: Secondary | ICD-10-CM | POA: Diagnosis not present

## 2019-03-04 DIAGNOSIS — I739 Peripheral vascular disease, unspecified: Secondary | ICD-10-CM | POA: Diagnosis not present

## 2019-03-04 DIAGNOSIS — Z1389 Encounter for screening for other disorder: Secondary | ICD-10-CM | POA: Diagnosis not present

## 2019-03-04 DIAGNOSIS — C8510 Unspecified B-cell lymphoma, unspecified site: Secondary | ICD-10-CM | POA: Diagnosis not present

## 2019-03-04 DIAGNOSIS — N400 Enlarged prostate without lower urinary tract symptoms: Secondary | ICD-10-CM | POA: Diagnosis not present

## 2019-03-04 DIAGNOSIS — I779 Disorder of arteries and arterioles, unspecified: Secondary | ICD-10-CM | POA: Diagnosis not present

## 2019-03-04 DIAGNOSIS — E7800 Pure hypercholesterolemia, unspecified: Secondary | ICD-10-CM | POA: Diagnosis not present

## 2019-03-04 DIAGNOSIS — J449 Chronic obstructive pulmonary disease, unspecified: Secondary | ICD-10-CM | POA: Diagnosis not present

## 2019-03-04 DIAGNOSIS — E1151 Type 2 diabetes mellitus with diabetic peripheral angiopathy without gangrene: Secondary | ICD-10-CM | POA: Diagnosis not present

## 2019-04-29 ENCOUNTER — Ambulatory Visit: Payer: Medicare Other

## 2019-05-08 ENCOUNTER — Ambulatory Visit: Payer: Medicare Other | Attending: Internal Medicine

## 2019-05-08 DIAGNOSIS — Z23 Encounter for immunization: Secondary | ICD-10-CM | POA: Insufficient documentation

## 2019-05-08 NOTE — Progress Notes (Signed)
   Covid-19 Vaccination Clinic  Name:  Samuel Berger    MRN: [id number] DOB: Oct 02, 1945  05/08/2019  [name] was observed post Covid-19 immunization for 15 minutes without incidence. He was provided with Vaccine Information Sheet and instruction to access the V-Safe system.   [name] was instructed to call 911 with any severe reactions post vaccine: Marland Kitchen Difficulty breathing  . Swelling of your face and throat  . A fast heartbeat  . A bad rash all over your body  . Dizziness and weakness    Immunizations Administered    Name Date Dose VIS Date Route   Pfizer COVID-19 Vaccine 05/08/2019  9:03 AM 0.3 mL 03/13/2019 Intramuscular   Manufacturer: [place]   Lot: [id number]   [place]: [id number]

## 2019-06-03 ENCOUNTER — Ambulatory Visit: Payer: Medicare Other | Attending: Internal Medicine

## 2019-06-03 DIAGNOSIS — Z23 Encounter for immunization: Secondary | ICD-10-CM | POA: Insufficient documentation

## 2019-06-03 NOTE — Progress Notes (Signed)
   Covid-19 Vaccination Clinic  Name:  Samuel Berger    MRN: [id number] DOB: 1945-12-19  06/03/2019  [name] was observed post Covid-19 immunization for 15 minutes without incident. He was provided with Vaccine Information Sheet and instruction to access the V-Safe system.   [name] was instructed to call 911 with any severe reactions post vaccine: Marland Kitchen Difficulty breathing  . Swelling of face and throat  . A fast heartbeat  . A bad rash all over body  . Dizziness and weakness   Immunizations Administered    Name Date Dose VIS Date Route   Pfizer COVID-19 Vaccine 06/03/2019  8:24 AM 0.3 mL 03/13/2019 Intramuscular   Manufacturer: [place]   Lot: [id number]   [place]: [id number]

## 2019-07-01 DIAGNOSIS — Z7984 Long term (current) use of oral hypoglycemic drugs: Secondary | ICD-10-CM | POA: Diagnosis not present

## 2019-07-01 DIAGNOSIS — E119 Type 2 diabetes mellitus without complications: Secondary | ICD-10-CM | POA: Diagnosis not present

## 2019-07-01 DIAGNOSIS — E11319 Type 2 diabetes mellitus with unspecified diabetic retinopathy without macular edema: Secondary | ICD-10-CM | POA: Diagnosis not present

## 2019-07-28 ENCOUNTER — Telehealth: Payer: Self-pay

## 2019-07-28 ENCOUNTER — Other Ambulatory Visit: Payer: Self-pay | Admitting: Hematology and Oncology

## 2019-07-28 DIAGNOSIS — C8338 Diffuse large B-cell lymphoma, lymph nodes of multiple sites: Secondary | ICD-10-CM

## 2019-07-28 NOTE — Telephone Encounter (Signed)
Called and left a message asking him to call the office back regarding scheduling CT scan.

## 2019-07-28 NOTE — Telephone Encounter (Signed)
Called and scheduled CT Scan for 5/5, NPO for 4 hours prior to scan. Drink first bottle at 1030 and 2nd bottle at 1130. Lab appt 1130 prior to scan. Given appt with Dr. Alvy Bimler for 5/6 at [facility]. He verbalized understanding.

## 2019-08-05 ENCOUNTER — Inpatient Hospital Stay: Payer: Medicare Other | Attending: Hematology and Oncology

## 2019-08-05 ENCOUNTER — Other Ambulatory Visit: Payer: Self-pay

## 2019-08-05 ENCOUNTER — Ambulatory Visit (HOSPITAL_COMMUNITY)
Admission: RE | Admit: 2019-08-05 | Discharge: 2019-08-05 | Disposition: A | Discharge status: Home / Self Care | Payer: Medicare Other | Source: Ambulatory Visit | Attending: Hematology and Oncology | Admitting: Hematology and Oncology

## 2019-08-05 ENCOUNTER — Encounter (HOSPITAL_COMMUNITY): Payer: Self-pay

## 2019-08-05 DIAGNOSIS — E119 Type 2 diabetes mellitus without complications: Secondary | ICD-10-CM | POA: Insufficient documentation

## 2019-08-05 DIAGNOSIS — Z79899 Other long term (current) drug therapy: Secondary | ICD-10-CM | POA: Diagnosis not present

## 2019-08-05 DIAGNOSIS — C8338 Diffuse large B-cell lymphoma, lymph nodes of multiple sites: Secondary | ICD-10-CM | POA: Diagnosis not present

## 2019-08-05 DIAGNOSIS — C8330 Diffuse large B-cell lymphoma, unspecified site: Secondary | ICD-10-CM | POA: Diagnosis not present

## 2019-08-05 DIAGNOSIS — Z7984 Long term (current) use of oral hypoglycemic drugs: Secondary | ICD-10-CM | POA: Insufficient documentation

## 2019-08-05 DIAGNOSIS — I2510 Atherosclerotic heart disease of native coronary artery without angina pectoris: Secondary | ICD-10-CM | POA: Diagnosis not present

## 2019-08-05 DIAGNOSIS — Z9221 Personal history of antineoplastic chemotherapy: Secondary | ICD-10-CM | POA: Insufficient documentation

## 2019-08-05 LAB — COMPREHENSIVE METABOLIC PANEL
ALT: 21 U/L (ref 0–44)
AST: 21 U/L (ref 15–41)
Albumin: 4.6 g/dL (ref 3.5–5.0)
Alkaline Phosphatase: 64 U/L (ref 38–126)
Anion gap: 11 (ref 5–15)
BUN: 17 mg/dL (ref 8–23)
CO2: 29 mmol/L (ref 22–32)
Calcium: 9.8 mg/dL (ref 8.9–10.3)
Chloride: 100 mmol/L (ref 98–111)
Creatinine, Ser: 0.85 mg/dL (ref 0.61–1.24)
GFR calc Af Amer: >60 mL/min (ref >60)
GFR calc non Af Amer: >60 mL/min (ref >60)
Glucose, Bld: 99 mg/dL (ref 70–99)
Potassium: 4.2 mmol/L (ref 3.5–5.1)
Sodium: 140 mmol/L (ref 135–145)
Total Bilirubin: 0.5 mg/dL (ref 0.3–1.2)
Total Protein: 7.4 g/dL (ref 6.5–8.1)

## 2019-08-05 LAB — CBC WITH DIFFERENTIAL/PLATELET
Abs Immature Granulocytes: 0.05 10*3/uL (ref 0.00–0.07)
Basophils Absolute: 0.1 10*3/uL (ref 0.0–0.1)
Basophils Relative: 1 %
Eosinophils Absolute: 0.6 10*3/uL — ABNORMAL HIGH (ref 0.0–0.5)
Eosinophils Relative: 6 %
HCT: 49.9 % (ref 39.0–52.0)
Hemoglobin: 16.5 g/dL (ref 13.0–17.0)
Immature Granulocytes: 1 %
Lymphocytes Relative: 23 %
Lymphs Abs: 2.1 10*3/uL (ref 0.7–4.0)
MCH: 30.2 pg (ref 26.0–34.0)
MCHC: 33.1 g/dL (ref 30.0–36.0)
MCV: 91.2 fL (ref 80.0–100.0)
Monocytes Absolute: 1.2 10*3/uL — ABNORMAL HIGH (ref 0.1–1.0)
Monocytes Relative: 13 %
Neutro Abs: 5.4 10*3/uL (ref 1.7–7.7)
Neutrophils Relative %: 56 %
Platelets: 195 10*3/uL (ref 150–400)
RBC: 5.47 MIL/uL (ref 4.22–5.81)
RDW: 13.2 % (ref 11.5–15.5)
WBC: 9.4 10*3/uL (ref 4.0–10.5)
nRBC: 0 % (ref 0.0–0.2)

## 2019-08-05 LAB — LACTATE DEHYDROGENASE: LDH: 184 U/L (ref 98–192)

## 2019-08-05 MED ORDER — IOHEXOL 300 MG/ML  SOLN
100.0000 mL | Freq: Once | INTRAMUSCULAR | Status: AC | PRN
  Administered 2019-08-05: 100 mL via INTRAVENOUS

## 2019-08-06 ENCOUNTER — Inpatient Hospital Stay (HOSPITAL_BASED_OUTPATIENT_CLINIC_OR_DEPARTMENT_OTHER): Payer: Medicare Other | Admitting: Hematology and Oncology

## 2019-08-06 ENCOUNTER — Other Ambulatory Visit: Payer: Self-pay

## 2019-08-06 ENCOUNTER — Telehealth: Payer: Self-pay

## 2019-08-06 ENCOUNTER — Encounter: Payer: Self-pay | Admitting: Hematology and Oncology

## 2019-08-06 ENCOUNTER — Telehealth: Payer: Self-pay | Admitting: Hematology and Oncology

## 2019-08-06 VITALS — BP 146/67 | HR 82 | Temp 97.8°F | Resp 18 | Ht 70.0 in | Wt 217.2 lb

## 2019-08-06 DIAGNOSIS — I2510 Atherosclerotic heart disease of native coronary artery without angina pectoris: Secondary | ICD-10-CM | POA: Diagnosis not present

## 2019-08-06 DIAGNOSIS — Z7984 Long term (current) use of oral hypoglycemic drugs: Secondary | ICD-10-CM | POA: Diagnosis not present

## 2019-08-06 DIAGNOSIS — C8338 Diffuse large B-cell lymphoma, lymph nodes of multiple sites: Secondary | ICD-10-CM

## 2019-08-06 DIAGNOSIS — I739 Peripheral vascular disease, unspecified: Secondary | ICD-10-CM | POA: Diagnosis not present

## 2019-08-06 DIAGNOSIS — Z9221 Personal history of antineoplastic chemotherapy: Secondary | ICD-10-CM | POA: Diagnosis not present

## 2019-08-06 DIAGNOSIS — E119 Type 2 diabetes mellitus without complications: Secondary | ICD-10-CM | POA: Diagnosis not present

## 2019-08-06 NOTE — Telephone Encounter (Signed)
Scheduled appts per 5/6 sch msg. Left voicemail with appt date and time.  

## 2019-08-06 NOTE — Telephone Encounter (Signed)
Called and left below message. Ask him to call for questions. 

## 2019-08-06 NOTE — Assessment & Plan Note (Signed)
We have extensive discussions about the importance of eating healthy and dietary modification The patient appears interested to lose some weight before I see him back I encouraged him to do so  He is aware that diabetes and obesity are linked with risk of lymphoma

## 2019-08-06 NOTE — Progress Notes (Signed)
[place] OFFICE PROGRESS NOTE  Patient Care Team: Wenda Low, MD as PCP - General (Internal Medicine)  ASSESSMENT & PLAN:  Diffuse large B-cell lymphoma of lymph nodes of multiple regions [facility]) He is doing well clinically He has no signs or symptoms to suggest cancer recurrence CT scan showed no evidence of disease I recommend discontinuation of surveillance imaging I will follow him clinically once a year He is in agreement He is educated to watch out for signs and symptoms of cancer recurrence  Morbid obesity due to excess calories ([place]) We have extensive discussions about the importance of eating healthy and dietary modification The patient appears interested to lose some weight before I see him back I encouraged him to do so  He is aware that diabetes and obesity are linked with risk of lymphoma  PAD (peripheral artery disease) (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 CBC with Differential/Platelet    Standing Status:   Future    Standing Expiration Date:   09/09/2020  . Comprehensive metabolic panel    Standing Status:   Future    Standing Expiration Date:   09/09/2020  . Lactate dehydrogenase    Standing Status:   Future    Standing Expiration Date:   08/05/2020    All questions were answered. The patient knows to call the clinic with any problems, questions or concerns. The total time spent in the appointment was 20 minutes encounter with patients including review of chart and various tests results, discussions about plan of care and coordination of care plan   Heath Lark, MD 08/06/2019 10:28 AM  INTERVAL HISTORY: Please see below for problem oriented charting. 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 Stable ectasia or post stenotic dilatation of the right external iliac artery measuring up to 1.5 cm. Extensive atherosclerotic disease involving the abdominal aorta with a chronic intimal flap or short segment dissection in the infrarenal abdominal aorta. Chronic occlusion of the left common iliac artery. Slowly enlarging retroperitoneal lymph nodes as described. An indolent neoplastic or lymphoproliferative process cannot be excluded   01/06/2016 Imaging   Ct abdomen and pelvis showed new extensive centrally necrotic retroperitoneal and bilateral pelvic lymphadenopathy. Differential includes lymphoproliferative condition (lymphoma/leukemia) or infectious lymphadenitis (such as due to bacterial, viral or mycobacterial etiologies). Squamous cell carcinoma metastases can have a similar appearance, although this is less likely given the location. Clinical and laboratory evaluation for a lymphoproliferative condition and tissue sampling is advised. 2. Aortic atherosclerosis. Stable 3.1 cm infrarenal abdominal aortic aneurysm   01/27/2016 Imaging   CH chest showed no acute  cardiopulmonary disease. Known stable retrocrural and periaortic adenopathy. Oval density over the left neck base measuring 2.5 x 4.5 cm likely due partially to prominent internal jugular vein, although cannot exclude  adenopathy in this region. Recommend neck CT with contrast. Three vessel atherosclerotic coronary artery disease. Few small thyroid nodules with the largest measuring 1.2 cm over the left lobe. 2.4 cm left renal cyst. Sub cm right renal hypodensity unchanged and too small to characterize but likely a cyst. Minimal cholelithiasis.   01/31/2016 Imaging   ECHO showed: Left ventricle: The cavity size was normal. Systolic function was normal. The estimated ejection fraction was in the range of 60% to 65%. Wall motion was normal; there were no regional wall motion abnormalities. There was an increased relative contribution of atrial contraction to ventricular filling. Doppler parameters are consistent with abnormal left ventricular relaxation (grade 1 diastolic dysfunction).   02/07/2016 Procedure   He had imaging guided biopsy of supraclavicular lymph node   02/07/2016 Pathology Results   Accession: [id number] Core biopsies reveal diffuse areas of large atypical lymphoid cells. There are multiple cells with very large irregular nuclei or multinucleated cells. Immunohistochemistry reveals the cells are positive for CD20, CD45, bcl-2, and bcl-6. They are negative for CD10, CD30, and ALK. CD3, CD4, CD5 and CD8 reveal scattered T-cells. Ki-67 reveals an elevated proliferation rate (95%). The cells are negative for cytokeratin 7, cytokeratin 20, TTF-1, CDX-2, HMB45, SOX11, and MelanA. 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 He received cycle 1 of chemotherapy will hospitalized   02/14/2016 Imaging   Ct abdomen and pelvis showed widespread retrocrural, retroperitoneal, and bilateral pelvic lymphadenopathy, waxing/waning when compared to recent CT, as above. Mild right hydronephrosis, likely secondary to extrinsic compression by right retroperitoneal lymphadenopathy, with associated  diminished enhancement of the right kidney. Spleen is normal in size.   02/16/2016 Bone Marrow Biopsy   Bone marrow biopsy was negative for lymphoma involvement. Cytogenetics came back abnormal: -Y in 45% of cell lines   02/16/2016 Procedure   He had port placement   02/16/2016 - 02/21/2016 Chemotherapy   He received cycle 1 of R-EPOCH    03/08/2016 - 03/12/2016 Hospital Admission   The patient received cycle 2 of chemotherapy in the hospital   03/27/2016 PET scan   Significant decrease in size of abdominal and pelvic lymphadenopathy compared to previous CT, which shows low-grade metabolic activity. No evidence of active lymphoma within the neck or chest. 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 Continued annual follow-up by CT is recommended.    04/03/2016 - 04/07/2016 Hospital Admission   The patient received cycle 3 of chemotherapy in the hospital   04/20/2016 Procedure   He underwent successful intrathecal injection of chemotherapy without complication   4/56/2563 Pathology Results   [id number] CSF fluid negative for malignant cells   04/24/2016 - 04/28/2016 Hospital Admission   The patient received cycle 4 of chemotherapy in the hospital   05/15/2016 - 05/19/2016 Hospital Admission   He received cycle 5 of chemotherapy in the hospital   05/17/2016 Procedure   Successful intrathecal injection of methotrexate.   06/05/2016 - 06/09/2016 Hospital Admission   He is admitted to the hospital for cycle 6 of chemotherapy   06/07/2016 Procedure   Successful intrathecal chemotherapy injection   06/27/2016 PET scan   Further reduction in size and metabolic activity of lower abdominal and pelvic  lymph nodes indicating response to therapy. 2. 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 3. Other imaging findings of potential clinical significance: Mucous retention cyst in the left maxillary sinus. Right mastoid effusion. Left renal cyst. Coronary, aortic arch, and branch  vessel atherosclerotic vascular disease. Aortoiliac atherosclerotic vascular disease. Prominent stool throughout the colon favors constipation. Indirect left inguinal hernia containing adipose tissue. Cholelithiasis.   06/28/2016 Procedure   Intrathecal injection of chemotherapy without complication   8/37/2902 PET scan   1. Small LEFT external iliac lymph node is increased metabolic activity compared to prior (similar to liver activity ( [place] 3). No additional hypermetabolic adenopathy in the chest, abdomen, or pelvis. Recommend attention on follow-up. 2. Normal spleen and marrow.   12/19/2016 PET scan   1. No abnormal hypermetabolism in the neck, chest, abdomen or pelvis. 2. Aortic atherosclerosis (ICD10-170.0). Three-vessel coronary artery calcification. 3. Right common iliac artery aneurysm. 4. Cholelithiasis.   07/08/2017 Imaging   1. No definite findings to suggest recurrent lymphoma in the chest, abdomen or pelvis. 2. Spectrum of findings in the right lung, most evident in the right lower lobe, suggestive of very mild bronchopneumonia. 3. Aortic atherosclerosis, in addition to three-vessel coronary artery disease. Assessment for potential risk factor modification, dietary therapy or pharmacologic therapy may be warranted, if clinically indicated. 4. In addition, there is aneurysmal dilatation of the right common iliac artery which is larger than the prior study, currently measuring 3.0 x 2.8 cm. 5. Left inguinal hernia containing fat. 6. Additional incidental findings, as above.  Aortic Atherosclerosis (ICD10-I70.0).   02/14/2018 Imaging   1. Asymmetric soft tissue swelling with inflammatory stranding involving the right face as above, consistent with acute infection/cellulitis. Cellulitic infections at this location are often odontogenic in origin, and note is made of dehiscence of the alveolar ridge of the right first maxillary molar, which could serve as a source of infection.  No discrete abscess or drainable fluid collection identified. 2. Mild asymmetric prominence of subcentimeter right cervical lymph nodes, favored to be reactive in nature. No pathologically enlarged adenopathy within the neck. 3. Chronic right-sided paranasal sinus disease as above.   07/28/2018 Imaging   1. No current findings of active lymphoma. 2. Right common iliac artery aneurysm, 2.8 cm in diameter, surveillance suggested. 3. Other imaging findings of potential clinical significance: Aortic Atherosclerosis (ICD10-I70.0). Coronary atherosclerosis. Left thyroid nodule, present but not hypermetabolic on prior PET-CT hence likely benign. Aortic valve calcification. Gynecomastia. Faint chronic nodularity in the right upper lobe, felt to be benign. Airway thickening is present, suggesting bronchitis or reactive airways disease. Borderline appearance for constipation. Bilateral direct inguinal hernias contain adipose tissue. Lumbar impingement at several levels due to spondylosis and degenerative disc disease.   09/16/2018 Procedure   Removal of implanted Port-A-Cath utilizing sharp and blunt dissection. The procedure was uncomplicated.   08/05/2019 Imaging   No suspicious lymphadenopathy in the chest, abdomen, or pelvis.   Spleen is normal in size.   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 Continued attention on follow-up is suggested.   Additional stable ancillary findings as above     REVIEW OF SYSTEMS:   Constitutional: Denies fevers, chills or abnormal weight loss Eyes: Denies blurriness of vision Ears, nose, mouth, throat, and face: Denies mucositis or sore throat Respiratory: Denies cough, dyspnea or wheezes Cardiovascular: Denies palpitation, chest discomfort or lower extremity swelling Gastrointestinal:  Denies nausea, heartburn or change in bowel habits Skin: Denies abnormal skin rashes Lymphatics: Denies new lymphadenopathy or  easy bruising Neurological:Denies numbness,  tingling or new weaknesses Behavioral/Psych: Mood is stable, no new changes  All other systems were reviewed with the patient and are negative.  I have reviewed the past medical history, past surgical history, social history and family history with the patient and they are unchanged from previous note.  ALLERGIES:  is allergic to bee venom and sulfa drugs cross reactors.  MEDICATIONS:  Current Outpatient Medications  Medication Sig Dispense Refill  . magnesium oxide (MAG-OX) 400 MG tablet Take 400 mg by mouth 2 (two) times daily.    Marland Kitchen amLODipine (NORVASC) 10 MG tablet Take 10 mg by mouth every morning.     Marland Kitchen atorvastatin (LIPITOR) 40 MG tablet Take 40 mg by mouth every evening.     . Cholecalciferol (VITAMIN D3) 25 MCG TABS Take 50 mcg by mouth.    . clopidogrel (PLAVIX) 75 MG tablet Take 75 mg by mouth daily.    Marland Kitchen lisinopril (PRINIVIL,ZESTRIL) 5 MG tablet daily.     . metFORMIN (GLUCOPHAGE) 500 MG tablet Take 500 mg by mouth 2 (two) times daily with a meal.     No current facility-administered medications for this visit.    PHYSICAL EXAMINATION: ECOG PERFORMANCE STATUS: 0 - Asymptomatic  Vitals:   08/06/19 0847  BP: (!) 146/67  Pulse: 82  Resp: 18  Temp: 97.8 F (36.6 C)  SpO2: 97%   Filed Weights   08/06/19 0847  Weight: 217 lb 3.2 oz (98.5 kg)    GENERAL:alert, no distress and comfortable  LABORATORY DATA:  I have reviewed the data as listed    Component Value Date/Time   NA 140 08/05/2019 1146   NA 141 12/18/2016 1403   K 4.2 08/05/2019 1146   K 4.4 12/18/2016 1403   CL 100 08/05/2019 1146   CO2 29 08/05/2019 1146   CO2 28 12/18/2016 1403   GLUCOSE 99 08/05/2019 1146   GLUCOSE 125 12/18/2016 1403   BUN 17 08/05/2019 1146   BUN 19.3 12/18/2016 1403   CREATININE 0.85 08/05/2019 1146   CREATININE 0.82 07/28/2018 1057   CREATININE 1.0 12/18/2016 1403   CALCIUM 9.8 08/05/2019 1146   CALCIUM 9.6 12/18/2016 1403   PROT 7.4 08/05/2019 1146   PROT 6.4  12/18/2016 1403   ALBUMIN 4.6 08/05/2019 1146   ALBUMIN 3.9 12/18/2016 1403   AST 21 08/05/2019 1146   AST 22 07/28/2018 1057   AST 16 12/18/2016 1403   ALT 21 08/05/2019 1146   ALT 33 07/28/2018 1057   ALT 16 12/18/2016 1403   ALKPHOS 64 08/05/2019 1146   ALKPHOS 66 12/18/2016 1403   BILITOT 0.5 08/05/2019 1146   BILITOT 0.5 07/28/2018 1057   BILITOT 0.34 12/18/2016 1403   GFRNONAA >60 08/05/2019 1146   GFRNONAA >60 07/28/2018 1057   GFRAA >60 08/05/2019 1146   GFRAA >60 07/28/2018 1057    No results found for: SPEP, UPEP  Lab Results  Component Value Date   WBC 9.4 08/05/2019   NEUTROABS 5.4 08/05/2019   HGB 16.5 08/05/2019   HCT 49.9 08/05/2019   MCV 91.2 08/05/2019   PLT 195 08/05/2019      Chemistry      Component Value Date/Time   NA 140 08/05/2019 1146   NA 141 12/18/2016 1403   K 4.2 08/05/2019 1146   K 4.4 12/18/2016 1403   CL 100 08/05/2019 1146   CO2 29 08/05/2019 1146   CO2 28 12/18/2016 1403   BUN 17 08/05/2019 1146  BUN 19.3 12/18/2016 1403   CREATININE 0.85 08/05/2019 1146   CREATININE 0.82 07/28/2018 1057   CREATININE 1.0 12/18/2016 1403      Component Value Date/Time   CALCIUM 9.8 08/05/2019 1146   CALCIUM 9.6 12/18/2016 1403   ALKPHOS 64 08/05/2019 1146   ALKPHOS 66 12/18/2016 1403   AST 21 08/05/2019 1146   AST 22 07/28/2018 1057   AST 16 12/18/2016 1403   ALT 21 08/05/2019 1146   ALT 33 07/28/2018 1057   ALT 16 12/18/2016 1403   BILITOT 0.5 08/05/2019 1146   BILITOT 0.5 07/28/2018 1057   BILITOT 0.34 12/18/2016 1403       RADIOGRAPHIC STUDIES: I have reviewed CT imaging with the patient I have personally reviewed the radiological images as listed and agreed with the findings in the report. CT CHEST W CONTRAST  Result Date: 08/05/2019 CLINICAL DATA:  Large B-cell lymphoma, diagnosed 4 years ago, chemotherapy complete. EXAM: CT CHEST, ABDOMEN, AND PELVIS WITH CONTRAST TECHNIQUE: Multidetector CT imaging of the chest, abdomen and  pelvis was performed following the standard protocol during bolus administration of intravenous contrast. CONTRAST:  128m OMNIPAQUE IOHEXOL 300 MG/ML  SOLN COMPARISON:  07/28/2018 FINDINGS: CT CHEST FINDINGS Cardiovascular: Heart is normal in size.  No pericardial effusion. No evidence of thoracic aortic aneurysm. Atherosclerotic calcifications of the aortic arch. Three vessel coronary atherosclerosis. Mediastinum/Nodes: No suspicious mediastinal, hilar, or axillary lymphadenopathy. Small left axillary nodes measuring up to 5 mm short axis, within normal limits. Left thyroid nodules measuring up to 11 mm short axis, within normal limits. Not clinically significant; no follow-up imaging recommended (ref: J Am Coll Radiol. 2015 Feb;12(2): 143-50). Lungs/Pleura: Mild biapical pleural-parenchymal scarring. 3 mm subpleural nodule in the right upper lobe (series 4/image 28), benign. Additional 3 mm subpleural nodule along the right minor fissure (series 4/image 54), benign. No new/suspicious pulmonary nodules. No focal consolidation. No pleural effusion or pneumothorax. Musculoskeletal: Mild degenerative changes of the thoracic spine. CT ABDOMEN PELVIS FINDINGS Hepatobiliary: Liver is within normal limits. Gallbladder is unremarkable. No intrahepatic or extrahepatic ductal dilatation. Pancreas: Within normal limits. Spleen: Spleen is normal in size. Adrenals/Urinary Tract: Adrenal glands are within normal limits. Bilateral renal cysts, measuring up to 2.9 cm in the medial left upper pole (series 2/image 62), benign. No hydronephrosis. Bladder is within normal limits. Stomach/Bowel: Stomach is within normal limits. No evidence of bowel obstruction. Normal appendix (series 2/image 100). Mild left colonic stool burden. No colonic wall thickening or inflammatory changes. Vascular/Lymphatic: No evidence of abdominal aortic aneurysm. 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 Atherosclerotic calcifications of the  abdominal aorta and branch vessels. No suspicious abdominopelvic lymphadenopathy. Reproductive: Prostate is unremarkable. Other: No abdominopelvic ascites. Moderate fat containing left inguinal hernia, unchanged. Mild fat within the right inguinal canal. Musculoskeletal: Mild degenerative changes of the lumbar spine. IMPRESSION: No suspicious lymphadenopathy in the chest, abdomen, or pelvis. Spleen is normal in size. 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 Continued attention on follow-up is suggested. Additional stable ancillary findings as above. Electronically Signed   By: SJulian HyM.D.   On: 08/05/2019 14:29   CT ABDOMEN PELVIS W CONTRAST  Result Date: 08/05/2019 CLINICAL DATA:  Large B-cell lymphoma, diagnosed 4 years ago, chemotherapy complete. EXAM: CT CHEST, ABDOMEN, AND PELVIS WITH CONTRAST TECHNIQUE: Multidetector CT imaging of the chest, abdomen and pelvis was performed following the standard protocol during bolus administration of intravenous contrast. CONTRAST:  1059mOMNIPAQUE IOHEXOL 300 MG/ML  SOLN COMPARISON:  07/28/2018 FINDINGS:  CT CHEST FINDINGS Cardiovascular: Heart is normal in size.  No pericardial effusion. No evidence of thoracic aortic aneurysm. Atherosclerotic calcifications of the aortic arch. Three vessel coronary atherosclerosis. Mediastinum/Nodes: No suspicious mediastinal, hilar, or axillary lymphadenopathy. Small left axillary nodes measuring up to 5 mm short axis, within normal limits. Left thyroid nodules measuring up to 11 mm short axis, within normal limits. Not clinically significant; no follow-up imaging recommended (ref: J Am Coll Radiol. 2015 Feb;12(2): 143-50). Lungs/Pleura: Mild biapical pleural-parenchymal scarring. 3 mm subpleural nodule in the right upper lobe (series 4/image 28), benign. Additional 3 mm subpleural nodule along the right minor fissure (series 4/image 54), benign. No new/suspicious pulmonary nodules. No focal consolidation. No  pleural effusion or pneumothorax. Musculoskeletal: Mild degenerative changes of the thoracic spine. CT ABDOMEN PELVIS FINDINGS Hepatobiliary: Liver is within normal limits. Gallbladder is unremarkable. No intrahepatic or extrahepatic ductal dilatation. Pancreas: Within normal limits. Spleen: Spleen is normal in size. Adrenals/Urinary Tract: Adrenal glands are within normal limits. Bilateral renal cysts, measuring up to 2.9 cm in the medial left upper pole (series 2/image 62), benign. No hydronephrosis. Bladder is within normal limits. Stomach/Bowel: Stomach is within normal limits. No evidence of bowel obstruction. Normal appendix (series 2/image 100). Mild left colonic stool burden. No colonic wall thickening or inflammatory changes. Vascular/Lymphatic: No evidence of abdominal aortic aneurysm. 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 Atherosclerotic calcifications of the abdominal aorta and branch vessels. No suspicious abdominopelvic lymphadenopathy. Reproductive: Prostate is unremarkable. Other: No abdominopelvic ascites. Moderate fat containing left inguinal hernia, unchanged. Mild fat within the right inguinal canal. Musculoskeletal: Mild degenerative changes of the lumbar spine. IMPRESSION: No suspicious lymphadenopathy in the chest, abdomen, or pelvis. Spleen is normal in size. 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 Continued attention on follow-up is suggested. Additional stable ancillary findings as above. Electronically Signed   By: Julian Hy M.D.   On: 08/05/2019 14:29

## 2019-08-06 NOTE — Assessment & Plan Note (Signed)
He is doing well clinically He has no signs or symptoms to suggest cancer recurrence CT scan showed no evidence of disease I recommend discontinuation of surveillance imaging I will follow him clinically once a year He is in agreement He is educated to watch out for signs and symptoms of cancer recurrence

## 2019-08-06 NOTE — Assessment & Plan Note (Signed)
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

## 2019-08-06 NOTE — Telephone Encounter (Signed)
-----   Message from Heath Lark, MD sent at 08/06/2019 10:25 AM EDT ----- Regarding: forgot to let him know I am cancelling appt in July and see him next year as discussed

## 2019-08-17 ENCOUNTER — Telehealth: Payer: Self-pay

## 2019-08-17 NOTE — Telephone Encounter (Signed)
Received TC from patient requesting that recent labs and scan results be faxed over to Dr Marchia Bond office. Fax sent to [phone] and came back as complete. Copy of fax placed in basket on nurses desk.

## 2019-09-15 ENCOUNTER — Other Ambulatory Visit: Payer: Self-pay | Admitting: *Deleted

## 2019-09-15 DIAGNOSIS — I6523 Occlusion and stenosis of bilateral carotid arteries: Secondary | ICD-10-CM

## 2019-09-15 DIAGNOSIS — I714 Abdominal aortic aneurysm, without rupture, unspecified: Secondary | ICD-10-CM

## 2019-09-23 ENCOUNTER — Ambulatory Visit (INDEPENDENT_AMBULATORY_CARE_PROVIDER_SITE_OTHER)
Admission: RE | Admit: 2019-09-23 | Discharge: 2019-09-23 | Disposition: A | Discharge status: Home / Self Care | Payer: Medicare Other | Source: Ambulatory Visit | Attending: Vascular Surgery | Admitting: Vascular Surgery

## 2019-09-23 ENCOUNTER — Encounter: Payer: Self-pay | Admitting: Vascular Surgery

## 2019-09-23 ENCOUNTER — Encounter (HOSPITAL_COMMUNITY): Payer: Self-pay

## 2019-09-23 ENCOUNTER — Ambulatory Visit (INDEPENDENT_AMBULATORY_CARE_PROVIDER_SITE_OTHER): Payer: Medicare Other | Admitting: Vascular Surgery

## 2019-09-23 ENCOUNTER — Ambulatory Visit (HOSPITAL_COMMUNITY)
Admission: RE | Admit: 2019-09-23 | Discharge: 2019-09-23 | Disposition: A | Discharge status: Home / Self Care | Payer: Medicare Other | Source: Ambulatory Visit | Attending: Vascular Surgery | Admitting: Vascular Surgery

## 2019-09-23 ENCOUNTER — Other Ambulatory Visit: Payer: Self-pay

## 2019-09-23 VITALS — BP 142/78 | HR 76 | Temp 97.8°F | Resp 20 | Ht 70.0 in | Wt 216.6 lb

## 2019-09-23 DIAGNOSIS — I6523 Occlusion and stenosis of bilateral carotid arteries: Secondary | ICD-10-CM | POA: Diagnosis present

## 2019-09-23 DIAGNOSIS — I714 Abdominal aortic aneurysm, without rupture, unspecified: Secondary | ICD-10-CM

## 2019-09-23 DIAGNOSIS — I739 Peripheral vascular disease, unspecified: Secondary | ICD-10-CM

## 2019-09-23 NOTE — Progress Notes (Signed)
REASON FOR VISIT:   Follow-up of carotid disease and right common iliac artery aneurysm  MEDICAL ISSUES:   STATUS POST RIGHT CAROTID ENDARTERECTOMY: The patient underwent right carotid endarterectomy in 2013.  His right carotid endarterectomy site is widely patent.  He has no significant stenosis on the left.  He is asymptomatic.  He is on Plavix and is on a statin.  I ordered a follow-up carotid duplex scan in 1 year and I will see him back at that time.  He knows to call sooner if he has problems.  RIGHT COMMON ILIAC ARTERY ANEURYSM: His right common iliac artery aneurysm is stable in size at 3.1 cm.  His CT scan showed no significant change in the size of his aneurysm.  For this reason we did not do an ultrasound as originally scheduled.  He is not a smoker.  His blood pressures under good control.  We would only consider addressing his iliac aneurysm if this enlarge significantly.  As per my previous note if this did enlarge we could probably address this with an endovascular approach.   HPI:   Samuel Berger is a pleasant 74 y.o. male who underwent a right carotid endarterectomy in 2013.  He comes in for routine follow-up visit.  We have also been following a 3.1 cm right common iliac artery aneurysm.  Since I saw him last, he denies any history of stroke, TIAs, expressive or receptive aphasia, or amaurosis fugax.  He is on Plavix and his statin.  He is not a smoker. He has a known 3.1 cm right common iliac artery aneurysm.  On his most recent visit over a year ago the abdominal aorta measured 3.0 cm in maximum diameter of the right common iliac artery measured 3.1 cm in maximum diameter.  He was scheduled for ultrasound today but had a CT scan on 08/05/2019 the results are discussed below.  He denies any abdominal pain or back pain.   Past Medical History:  Diagnosis Date  . Aneurysm artery, iliac ([place])   . Carotid artery occlusion   . Dyslipidemia   . GERD (gastroesophageal  reflux disease)   . Hepatitis    ? age 21 or 4  . Hyperlipidemia   . Hypertension   . Iliac artery aneurysm, right ([place])   . large b cell lymphoma dx'd 02/2016  . Stroke ([place])    01-05-2012   [id number]  . Type II or unspecified type diabetes mellitus without mention of complication, not stated as uncontrolled     Family History  Problem Relation Age of Onset  . Diabetes Mother   . Lung cancer Father     SOCIAL HISTORY: Social History   Tobacco Use  . Smoking status: Former Smoker    Packs/day: 0.50    Years: 30.00    Pack years: 15.00    Types: Cigars    Quit date: 01/05/2012    Years since quitting: 7.7  . Smokeless tobacco: Former Network engineer Use Topics  . Alcohol use: Yes    Alcohol/week: 3.0 standard drinks    Types: 1 Glasses of wine, 1 Cans of beer, 1 Shots of liquor per week    Comment: quit 2 months..Patient drinks coffee daily.OCCASIONAL DRINKER    Allergies  Allergen Reactions  . Bee Venom Hives  . Sulfa Drugs Cross Reactors Other (See Comments)    Unknown    Current Outpatient Medications  Medication Sig Dispense Refill  . amLODipine (NORVASC) 10 MG tablet  Take 10 mg by mouth every morning.     Marland Kitchen atorvastatin (LIPITOR) 40 MG tablet Take 40 mg by mouth every evening.     . Cholecalciferol (VITAMIN D3) 25 MCG TABS Take 50 mcg by mouth.    . clopidogrel (PLAVIX) 75 MG tablet Take 75 mg by mouth daily.    Marland Kitchen lisinopril (PRINIVIL,ZESTRIL) 5 MG tablet daily.     . magnesium oxide (MAG-OX) 400 MG tablet Take 400 mg by mouth 2 (two) times daily.    . metFORMIN (GLUCOPHAGE) 500 MG tablet Take 500 mg by mouth 2 (two) times daily with a meal.     No current facility-administered medications for this visit.    REVIEW OF SYSTEMS:  [X]  denotes positive finding, [ ]  denotes negative finding Cardiac  Comments:  Chest pain or chest pressure:    Shortness of breath upon exertion:    Short of breath when lying flat:    Irregular heart rhythm:        Vascular     Pain in calf, thigh, or hip brought on by ambulation:    Pain in feet at night that wakes you up from your sleep:     Blood clot in your veins:    Leg swelling:         Pulmonary    Oxygen at home:    Productive cough:     Wheezing:         Neurologic    Sudden weakness in arms or legs:     Sudden numbness in arms or legs:     Sudden onset of difficulty speaking or slurred speech:    Temporary loss of vision in one eye:     Problems with dizziness:         Gastrointestinal    Blood in stool:     Vomited blood:         Genitourinary    Burning when urinating:     Blood in urine:        Psychiatric    Major depression:         Hematologic    Bleeding problems:    Problems with blood clotting too easily:        Skin    Rashes or ulcers:        Constitutional    Fever or chills:     PHYSICAL EXAM:   Vitals:   09/23/19 0958 09/23/19 1002  BP: (!) 148/82 (!) 142/78  Pulse: 76   Resp: 20   Temp: 97.8 F (36.6 C)   SpO2: 96%   Weight: 216 lb 9.6 oz (98.2 kg)   Height: 5\' 10"  (1.778 m)     GENERAL: The patient is a well-nourished male, in no acute distress. The vital signs are documented above. CARDIAC: There is a regular rate and rhythm.  VASCULAR: I do not detect carotid bruits. I cannot palpate pedal pulses however both feet are warm and well perfused. He has no significant lower extremity swelling.  He does have some hyperpigmentation bilaterally consistent with chronic venous insufficiency. PULMONARY: There is good air exchange bilaterally without wheezing or rales. ABDOMEN: Soft and non-tender with normal pitched bowel sounds.  I cannot palpate his aneurysm. MUSCULOSKELETAL: There are no major deformities or cyanosis. NEUROLOGIC: No focal weakness or paresthesias are detected. SKIN: There are no ulcers or rashes noted. PSYCHIATRIC: The patient has a normal affect.  DATA:    CAROTID DUPLEX: I have independently interpreted his carotid duplex  scan  today.  He has a less than 39% carotid stenosis bilaterally.  There is no evidence of recurrent carotid stenosis on the right.  Both vertebral arteries are patent with antegrade flow.  CT ABDOMEN PELVIS: I reviewed his CT abdomen pelvis that was done on 08/05/2019.  This shows by my measurement that the right common iliac artery aneurysm is 3.0 cm in maximum diameter.  There is been no change in size over the last year.  Deitra Mayo [facility] [phone]

## 2019-10-06 ENCOUNTER — Ambulatory Visit: Payer: Medicare Other | Admitting: Hematology and Oncology

## 2019-10-06 ENCOUNTER — Other Ambulatory Visit: Payer: Medicare Other

## 2019-10-09 ENCOUNTER — Other Ambulatory Visit: Payer: Medicare Other

## 2019-10-09 ENCOUNTER — Ambulatory Visit: Payer: Medicare Other | Admitting: Hematology and Oncology

## 2019-12-31 DIAGNOSIS — Z23 Encounter for immunization: Secondary | ICD-10-CM | POA: Diagnosis not present

## 2020-01-05 DIAGNOSIS — Z20822 Contact with and (suspected) exposure to covid-19: Secondary | ICD-10-CM | POA: Diagnosis not present

## 2020-01-05 DIAGNOSIS — I4510 Unspecified right bundle-branch block: Secondary | ICD-10-CM | POA: Diagnosis not present

## 2020-01-05 DIAGNOSIS — R059 Cough, unspecified: Secondary | ICD-10-CM | POA: Diagnosis not present

## 2020-01-05 DIAGNOSIS — R0600 Dyspnea, unspecified: Secondary | ICD-10-CM | POA: Diagnosis not present

## 2020-01-13 DIAGNOSIS — I10 Essential (primary) hypertension: Secondary | ICD-10-CM | POA: Diagnosis not present

## 2020-01-13 DIAGNOSIS — E1151 Type 2 diabetes mellitus with diabetic peripheral angiopathy without gangrene: Secondary | ICD-10-CM | POA: Diagnosis not present

## 2020-01-13 DIAGNOSIS — J449 Chronic obstructive pulmonary disease, unspecified: Secondary | ICD-10-CM | POA: Diagnosis not present

## 2020-01-13 DIAGNOSIS — E7800 Pure hypercholesterolemia, unspecified: Secondary | ICD-10-CM | POA: Diagnosis not present

## 2020-02-03 DIAGNOSIS — Z23 Encounter for immunization: Secondary | ICD-10-CM | POA: Diagnosis not present

## 2020-03-08 DIAGNOSIS — I739 Peripheral vascular disease, unspecified: Secondary | ICD-10-CM | POA: Diagnosis not present

## 2020-03-08 DIAGNOSIS — Z0000 Encounter for general adult medical examination without abnormal findings: Secondary | ICD-10-CM | POA: Diagnosis not present

## 2020-03-08 DIAGNOSIS — I723 Aneurysm of iliac artery: Secondary | ICD-10-CM | POA: Diagnosis not present

## 2020-03-08 DIAGNOSIS — Z7984 Long term (current) use of oral hypoglycemic drugs: Secondary | ICD-10-CM | POA: Diagnosis not present

## 2020-03-08 DIAGNOSIS — E1151 Type 2 diabetes mellitus with diabetic peripheral angiopathy without gangrene: Secondary | ICD-10-CM | POA: Diagnosis not present

## 2020-03-08 DIAGNOSIS — I779 Disorder of arteries and arterioles, unspecified: Secondary | ICD-10-CM | POA: Diagnosis not present

## 2020-03-08 DIAGNOSIS — C8510 Unspecified B-cell lymphoma, unspecified site: Secondary | ICD-10-CM | POA: Diagnosis not present

## 2020-03-08 DIAGNOSIS — I10 Essential (primary) hypertension: Secondary | ICD-10-CM | POA: Diagnosis not present

## 2020-03-08 DIAGNOSIS — J449 Chronic obstructive pulmonary disease, unspecified: Secondary | ICD-10-CM | POA: Diagnosis not present

## 2020-03-08 DIAGNOSIS — Z1389 Encounter for screening for other disorder: Secondary | ICD-10-CM | POA: Diagnosis not present

## 2020-03-08 DIAGNOSIS — E7800 Pure hypercholesterolemia, unspecified: Secondary | ICD-10-CM | POA: Diagnosis not present

## 2020-03-08 DIAGNOSIS — I700 Atherosclerosis of aorta: Secondary | ICD-10-CM | POA: Diagnosis not present

## 2020-03-14 DIAGNOSIS — E1151 Type 2 diabetes mellitus with diabetic peripheral angiopathy without gangrene: Secondary | ICD-10-CM | POA: Diagnosis not present

## 2020-03-14 DIAGNOSIS — N400 Enlarged prostate without lower urinary tract symptoms: Secondary | ICD-10-CM | POA: Diagnosis not present

## 2020-03-14 DIAGNOSIS — I10 Essential (primary) hypertension: Secondary | ICD-10-CM | POA: Diagnosis not present

## 2020-03-14 DIAGNOSIS — E7800 Pure hypercholesterolemia, unspecified: Secondary | ICD-10-CM | POA: Diagnosis not present

## 2020-03-14 DIAGNOSIS — I639 Cerebral infarction, unspecified: Secondary | ICD-10-CM | POA: Diagnosis not present

## 2020-03-14 DIAGNOSIS — J449 Chronic obstructive pulmonary disease, unspecified: Secondary | ICD-10-CM | POA: Diagnosis not present

## 2020-04-29 DIAGNOSIS — M25512 Pain in left shoulder: Secondary | ICD-10-CM | POA: Diagnosis not present

## 2020-05-04 DIAGNOSIS — M25512 Pain in left shoulder: Secondary | ICD-10-CM | POA: Diagnosis not present

## 2020-05-06 DIAGNOSIS — M75122 Complete rotator cuff tear or rupture of left shoulder, not specified as traumatic: Secondary | ICD-10-CM | POA: Diagnosis not present

## 2020-05-17 DIAGNOSIS — E7800 Pure hypercholesterolemia, unspecified: Secondary | ICD-10-CM | POA: Diagnosis not present

## 2020-05-17 DIAGNOSIS — M75122 Complete rotator cuff tear or rupture of left shoulder, not specified as traumatic: Secondary | ICD-10-CM | POA: Diagnosis not present

## 2020-05-17 DIAGNOSIS — I10 Essential (primary) hypertension: Secondary | ICD-10-CM | POA: Diagnosis not present

## 2020-05-17 DIAGNOSIS — I639 Cerebral infarction, unspecified: Secondary | ICD-10-CM | POA: Diagnosis not present

## 2020-05-17 DIAGNOSIS — M25612 Stiffness of left shoulder, not elsewhere classified: Secondary | ICD-10-CM | POA: Diagnosis not present

## 2020-05-17 DIAGNOSIS — N400 Enlarged prostate without lower urinary tract symptoms: Secondary | ICD-10-CM | POA: Diagnosis not present

## 2020-05-17 DIAGNOSIS — E1151 Type 2 diabetes mellitus with diabetic peripheral angiopathy without gangrene: Secondary | ICD-10-CM | POA: Diagnosis not present

## 2020-05-17 DIAGNOSIS — R531 Weakness: Secondary | ICD-10-CM | POA: Diagnosis not present

## 2020-05-17 DIAGNOSIS — J449 Chronic obstructive pulmonary disease, unspecified: Secondary | ICD-10-CM | POA: Diagnosis not present

## 2020-05-24 DIAGNOSIS — M25612 Stiffness of left shoulder, not elsewhere classified: Secondary | ICD-10-CM | POA: Diagnosis not present

## 2020-05-24 DIAGNOSIS — R531 Weakness: Secondary | ICD-10-CM | POA: Diagnosis not present

## 2020-05-24 DIAGNOSIS — M75122 Complete rotator cuff tear or rupture of left shoulder, not specified as traumatic: Secondary | ICD-10-CM | POA: Diagnosis not present

## 2020-05-26 DIAGNOSIS — R531 Weakness: Secondary | ICD-10-CM | POA: Diagnosis not present

## 2020-05-26 DIAGNOSIS — M75122 Complete rotator cuff tear or rupture of left shoulder, not specified as traumatic: Secondary | ICD-10-CM | POA: Diagnosis not present

## 2020-05-26 DIAGNOSIS — M25612 Stiffness of left shoulder, not elsewhere classified: Secondary | ICD-10-CM | POA: Diagnosis not present

## 2020-05-31 DIAGNOSIS — R531 Weakness: Secondary | ICD-10-CM | POA: Diagnosis not present

## 2020-05-31 DIAGNOSIS — M25612 Stiffness of left shoulder, not elsewhere classified: Secondary | ICD-10-CM | POA: Diagnosis not present

## 2020-05-31 DIAGNOSIS — M75122 Complete rotator cuff tear or rupture of left shoulder, not specified as traumatic: Secondary | ICD-10-CM | POA: Diagnosis not present

## 2020-06-02 DIAGNOSIS — M75122 Complete rotator cuff tear or rupture of left shoulder, not specified as traumatic: Secondary | ICD-10-CM | POA: Diagnosis not present

## 2020-06-02 DIAGNOSIS — R531 Weakness: Secondary | ICD-10-CM | POA: Diagnosis not present

## 2020-06-02 DIAGNOSIS — M25612 Stiffness of left shoulder, not elsewhere classified: Secondary | ICD-10-CM | POA: Diagnosis not present

## 2020-06-07 DIAGNOSIS — M75122 Complete rotator cuff tear or rupture of left shoulder, not specified as traumatic: Secondary | ICD-10-CM | POA: Diagnosis not present

## 2020-06-07 DIAGNOSIS — M25612 Stiffness of left shoulder, not elsewhere classified: Secondary | ICD-10-CM | POA: Diagnosis not present

## 2020-06-07 DIAGNOSIS — R531 Weakness: Secondary | ICD-10-CM | POA: Diagnosis not present

## 2020-06-09 DIAGNOSIS — R531 Weakness: Secondary | ICD-10-CM | POA: Diagnosis not present

## 2020-06-09 DIAGNOSIS — M75122 Complete rotator cuff tear or rupture of left shoulder, not specified as traumatic: Secondary | ICD-10-CM | POA: Diagnosis not present

## 2020-06-09 DIAGNOSIS — M25612 Stiffness of left shoulder, not elsewhere classified: Secondary | ICD-10-CM | POA: Diagnosis not present

## 2020-06-13 DIAGNOSIS — M75122 Complete rotator cuff tear or rupture of left shoulder, not specified as traumatic: Secondary | ICD-10-CM | POA: Diagnosis not present

## 2020-06-13 DIAGNOSIS — M25612 Stiffness of left shoulder, not elsewhere classified: Secondary | ICD-10-CM | POA: Diagnosis not present

## 2020-06-13 DIAGNOSIS — M25512 Pain in left shoulder: Secondary | ICD-10-CM | POA: Diagnosis not present

## 2020-06-13 DIAGNOSIS — R531 Weakness: Secondary | ICD-10-CM | POA: Diagnosis not present

## 2020-06-14 DIAGNOSIS — I639 Cerebral infarction, unspecified: Secondary | ICD-10-CM | POA: Diagnosis not present

## 2020-06-14 DIAGNOSIS — I10 Essential (primary) hypertension: Secondary | ICD-10-CM | POA: Diagnosis not present

## 2020-06-14 DIAGNOSIS — E7800 Pure hypercholesterolemia, unspecified: Secondary | ICD-10-CM | POA: Diagnosis not present

## 2020-06-14 DIAGNOSIS — E1151 Type 2 diabetes mellitus with diabetic peripheral angiopathy without gangrene: Secondary | ICD-10-CM | POA: Diagnosis not present

## 2020-06-14 DIAGNOSIS — N400 Enlarged prostate without lower urinary tract symptoms: Secondary | ICD-10-CM | POA: Diagnosis not present

## 2020-06-14 DIAGNOSIS — J449 Chronic obstructive pulmonary disease, unspecified: Secondary | ICD-10-CM | POA: Diagnosis not present

## 2020-06-16 DIAGNOSIS — M75122 Complete rotator cuff tear or rupture of left shoulder, not specified as traumatic: Secondary | ICD-10-CM | POA: Diagnosis not present

## 2020-06-16 DIAGNOSIS — M25612 Stiffness of left shoulder, not elsewhere classified: Secondary | ICD-10-CM | POA: Diagnosis not present

## 2020-06-16 DIAGNOSIS — R531 Weakness: Secondary | ICD-10-CM | POA: Diagnosis not present

## 2020-06-28 DIAGNOSIS — R531 Weakness: Secondary | ICD-10-CM | POA: Diagnosis not present

## 2020-06-28 DIAGNOSIS — M75122 Complete rotator cuff tear or rupture of left shoulder, not specified as traumatic: Secondary | ICD-10-CM | POA: Diagnosis not present

## 2020-06-28 DIAGNOSIS — M25612 Stiffness of left shoulder, not elsewhere classified: Secondary | ICD-10-CM | POA: Diagnosis not present

## 2020-06-30 DIAGNOSIS — R531 Weakness: Secondary | ICD-10-CM | POA: Diagnosis not present

## 2020-06-30 DIAGNOSIS — M75122 Complete rotator cuff tear or rupture of left shoulder, not specified as traumatic: Secondary | ICD-10-CM | POA: Diagnosis not present

## 2020-06-30 DIAGNOSIS — M25612 Stiffness of left shoulder, not elsewhere classified: Secondary | ICD-10-CM | POA: Diagnosis not present

## 2020-07-05 DIAGNOSIS — I10 Essential (primary) hypertension: Secondary | ICD-10-CM | POA: Diagnosis not present

## 2020-07-05 DIAGNOSIS — H2513 Age-related nuclear cataract, bilateral: Secondary | ICD-10-CM | POA: Diagnosis not present

## 2020-07-05 DIAGNOSIS — Z9849 Cataract extraction status, unspecified eye: Secondary | ICD-10-CM | POA: Diagnosis not present

## 2020-07-05 DIAGNOSIS — M75122 Complete rotator cuff tear or rupture of left shoulder, not specified as traumatic: Secondary | ICD-10-CM | POA: Diagnosis not present

## 2020-07-05 DIAGNOSIS — H35033 Hypertensive retinopathy, bilateral: Secondary | ICD-10-CM | POA: Diagnosis not present

## 2020-07-05 DIAGNOSIS — H5203 Hypermetropia, bilateral: Secondary | ICD-10-CM | POA: Diagnosis not present

## 2020-07-05 DIAGNOSIS — M25612 Stiffness of left shoulder, not elsewhere classified: Secondary | ICD-10-CM | POA: Diagnosis not present

## 2020-07-05 DIAGNOSIS — H524 Presbyopia: Secondary | ICD-10-CM | POA: Diagnosis not present

## 2020-07-05 DIAGNOSIS — Z961 Presence of intraocular lens: Secondary | ICD-10-CM | POA: Diagnosis not present

## 2020-07-05 DIAGNOSIS — H52223 Regular astigmatism, bilateral: Secondary | ICD-10-CM | POA: Diagnosis not present

## 2020-07-05 DIAGNOSIS — R531 Weakness: Secondary | ICD-10-CM | POA: Diagnosis not present

## 2020-07-07 DIAGNOSIS — M25612 Stiffness of left shoulder, not elsewhere classified: Secondary | ICD-10-CM | POA: Diagnosis not present

## 2020-07-07 DIAGNOSIS — M75122 Complete rotator cuff tear or rupture of left shoulder, not specified as traumatic: Secondary | ICD-10-CM | POA: Diagnosis not present

## 2020-07-07 DIAGNOSIS — R531 Weakness: Secondary | ICD-10-CM | POA: Diagnosis not present

## 2020-07-12 DIAGNOSIS — M25612 Stiffness of left shoulder, not elsewhere classified: Secondary | ICD-10-CM | POA: Diagnosis not present

## 2020-07-12 DIAGNOSIS — R531 Weakness: Secondary | ICD-10-CM | POA: Diagnosis not present

## 2020-07-12 DIAGNOSIS — M75122 Complete rotator cuff tear or rupture of left shoulder, not specified as traumatic: Secondary | ICD-10-CM | POA: Diagnosis not present

## 2020-07-15 DIAGNOSIS — E7800 Pure hypercholesterolemia, unspecified: Secondary | ICD-10-CM | POA: Diagnosis not present

## 2020-07-15 DIAGNOSIS — E1151 Type 2 diabetes mellitus with diabetic peripheral angiopathy without gangrene: Secondary | ICD-10-CM | POA: Diagnosis not present

## 2020-07-15 DIAGNOSIS — I10 Essential (primary) hypertension: Secondary | ICD-10-CM | POA: Diagnosis not present

## 2020-07-15 DIAGNOSIS — N400 Enlarged prostate without lower urinary tract symptoms: Secondary | ICD-10-CM | POA: Diagnosis not present

## 2020-07-15 DIAGNOSIS — J449 Chronic obstructive pulmonary disease, unspecified: Secondary | ICD-10-CM | POA: Diagnosis not present

## 2020-07-15 DIAGNOSIS — I639 Cerebral infarction, unspecified: Secondary | ICD-10-CM | POA: Diagnosis not present

## 2020-07-19 DIAGNOSIS — M25612 Stiffness of left shoulder, not elsewhere classified: Secondary | ICD-10-CM | POA: Diagnosis not present

## 2020-07-19 DIAGNOSIS — M75122 Complete rotator cuff tear or rupture of left shoulder, not specified as traumatic: Secondary | ICD-10-CM | POA: Diagnosis not present

## 2020-07-19 DIAGNOSIS — R531 Weakness: Secondary | ICD-10-CM | POA: Diagnosis not present

## 2020-07-21 DIAGNOSIS — M75122 Complete rotator cuff tear or rupture of left shoulder, not specified as traumatic: Secondary | ICD-10-CM | POA: Diagnosis not present

## 2020-07-21 DIAGNOSIS — M25612 Stiffness of left shoulder, not elsewhere classified: Secondary | ICD-10-CM | POA: Diagnosis not present

## 2020-07-21 DIAGNOSIS — R531 Weakness: Secondary | ICD-10-CM | POA: Diagnosis not present

## 2020-08-04 ENCOUNTER — Telehealth: Payer: Self-pay | Admitting: Hematology and Oncology

## 2020-08-04 ENCOUNTER — Other Ambulatory Visit: Payer: Self-pay

## 2020-08-04 ENCOUNTER — Inpatient Hospital Stay (HOSPITAL_BASED_OUTPATIENT_CLINIC_OR_DEPARTMENT_OTHER): Payer: Medicare HMO | Admitting: Hematology and Oncology

## 2020-08-04 ENCOUNTER — Encounter: Payer: Self-pay | Admitting: Hematology and Oncology

## 2020-08-04 ENCOUNTER — Inpatient Hospital Stay: Payer: Medicare HMO | Attending: Hematology and Oncology

## 2020-08-04 DIAGNOSIS — E119 Type 2 diabetes mellitus without complications: Secondary | ICD-10-CM | POA: Insufficient documentation

## 2020-08-04 DIAGNOSIS — Z79899 Other long term (current) drug therapy: Secondary | ICD-10-CM | POA: Diagnosis not present

## 2020-08-04 DIAGNOSIS — Z8572 Personal history of non-Hodgkin lymphomas: Secondary | ICD-10-CM | POA: Diagnosis not present

## 2020-08-04 DIAGNOSIS — Z794 Long term (current) use of insulin: Secondary | ICD-10-CM | POA: Diagnosis not present

## 2020-08-04 DIAGNOSIS — C8338 Diffuse large B-cell lymphoma, lymph nodes of multiple sites: Secondary | ICD-10-CM

## 2020-08-04 DIAGNOSIS — Z9221 Personal history of antineoplastic chemotherapy: Secondary | ICD-10-CM | POA: Insufficient documentation

## 2020-08-04 DIAGNOSIS — Z7984 Long term (current) use of oral hypoglycemic drugs: Secondary | ICD-10-CM | POA: Insufficient documentation

## 2020-08-04 DIAGNOSIS — E118 Type 2 diabetes mellitus with unspecified complications: Secondary | ICD-10-CM

## 2020-08-04 LAB — COMPREHENSIVE METABOLIC PANEL
ALT: 14 U/L (ref 0–44)
AST: 16 U/L (ref 15–41)
Albumin: 4.2 g/dL (ref 3.5–5.0)
Alkaline Phosphatase: 62 U/L (ref 38–126)
Anion gap: 11 (ref 5–15)
BUN: 17 mg/dL (ref 8–23)
CO2: 28 mmol/L (ref 22–32)
Calcium: 9.3 mg/dL (ref 8.9–10.3)
Chloride: 102 mmol/L (ref 98–111)
Creatinine, Ser: 0.92 mg/dL (ref 0.61–1.24)
GFR, Estimated: >60 mL/min (ref >60)
Glucose, Bld: 131 mg/dL — ABNORMAL HIGH (ref 70–99)
Potassium: 4.4 mmol/L (ref 3.5–5.1)
Sodium: 141 mmol/L (ref 135–145)
Total Bilirubin: 0.5 mg/dL (ref 0.3–1.2)
Total Protein: 6.9 g/dL (ref 6.5–8.1)

## 2020-08-04 LAB — CBC WITH DIFFERENTIAL/PLATELET
Abs Immature Granulocytes: 0.06 10*3/uL (ref 0.00–0.07)
Basophils Absolute: 0.1 10*3/uL (ref 0.0–0.1)
Basophils Relative: 1 %
Eosinophils Absolute: 0.6 10*3/uL — ABNORMAL HIGH (ref 0.0–0.5)
Eosinophils Relative: 6 %
HCT: 44.5 % (ref 39.0–52.0)
Hemoglobin: 15.2 g/dL (ref 13.0–17.0)
Immature Granulocytes: 1 %
Lymphocytes Relative: 23 %
Lymphs Abs: 2 10*3/uL (ref 0.7–4.0)
MCH: 30.4 pg (ref 26.0–34.0)
MCHC: 34.2 g/dL (ref 30.0–36.0)
MCV: 89 fL (ref 80.0–100.0)
Monocytes Absolute: 1.2 10*3/uL — ABNORMAL HIGH (ref 0.1–1.0)
Monocytes Relative: 14 %
Neutro Abs: 4.8 10*3/uL (ref 1.7–7.7)
Neutrophils Relative %: 55 %
Platelets: 199 10*3/uL (ref 150–400)
RBC: 5 MIL/uL (ref 4.22–5.81)
RDW: 12.9 % (ref 11.5–15.5)
WBC: 8.7 10*3/uL (ref 4.0–10.5)
nRBC: 0 % (ref 0.0–0.2)

## 2020-08-04 LAB — LACTATE DEHYDROGENASE: LDH: 175 U/L (ref 98–192)

## 2020-08-04 NOTE — Assessment & Plan Note (Signed)
He is doing well clinically He has no signs or symptoms to suggest cancer recurrence His last CT scan in 2021 showed no evidence of disease I recommend discontinuation of surveillance imaging I will follow him clinically once a year He is in agreement He is educated to watch out for signs and symptoms of cancer recurrence

## 2020-08-04 NOTE — Assessment & Plan Note (Signed)
We discussed the importance of dietary modification and weight loss  

## 2020-08-04 NOTE — Progress Notes (Signed)
[place] OFFICE PROGRESS NOTE  Patient Care Team: Wenda Low, MD as PCP - General (Internal Medicine)  ASSESSMENT & PLAN:  Diffuse large B-cell lymphoma of lymph nodes of multiple regions [facility]) He is doing well clinically He has no signs or symptoms to suggest cancer recurrence His last CT scan in 2021 showed no evidence of disease I recommend discontinuation of surveillance imaging I will follow him clinically once a year He is in agreement He is educated to watch out for signs and symptoms of cancer recurrence  Type 2 diabetes mellitus with complication, without long-term current use of insulin ([place]) We discussed the importance of dietary modification and weight loss  Morbid obesity due to excess calories ([place]) Only that the patient has class I obesity, he has signs of gynecomastia, reflective of possible fatty liver disease The patient has moderate alcohol intake and I advised him to quit drinking, exercise more and to attempt aggressive dietary modification and weight loss   No orders of the defined types were placed in this encounter.   All questions were answered. The patient knows to call the clinic with any problems, questions or concerns. The total time spent in the appointment was 20 minutes encounter with patients including review of chart and various tests results, discussions about plan of care and coordination of care plan   Heath Lark, MD 08/04/2020 10:02 AM  INTERVAL HISTORY: Please see below for problem oriented charting. He returns for further follow-up He has no lymphadenopathy Denies recent infection, fever or chills He follows with dermatologist closely and wears a hat when he goes out  SUMMARY OF ONCOLOGIC HISTORY: Oncology History Overview Note  NCCN-IPI  Age: 46 points LDH ratio >1: 1 point ECOG >2 at presentation: 1 point [place] Stage III: 1 point  Total 5 points High intermediate risk catergory: OS 38%, PFS 54%    Diffuse large B-cell lymphoma of lymph nodes of multiple regions ([place])  03/07/2015 Imaging   Ct angiogram showed stable aneurysm of the right common iliac artery measuring up to 2.8 cm. Stable ectasia or post stenotic dilatation of the right external iliac artery measuring up to 1.5 cm. Extensive atherosclerotic disease involving the abdominal aorta with a chronic intimal flap or short segment dissection in the infrarenal abdominal aorta. Chronic occlusion of the left common iliac artery. Slowly enlarging retroperitoneal lymph nodes as described. An indolent neoplastic or lymphoproliferative process cannot be excluded   01/06/2016 Imaging   Ct abdomen and pelvis showed new extensive centrally necrotic retroperitoneal and bilateral pelvic lymphadenopathy. Differential includes lymphoproliferative condition (lymphoma/leukemia) or infectious lymphadenitis (such as due to bacterial, viral or mycobacterial etiologies). Squamous cell carcinoma metastases can have a similar appearance, although this is less likely given the location. Clinical and laboratory evaluation for a lymphoproliferative condition and tissue sampling is advised. 2. Aortic atherosclerosis. Stable 3.1 cm infrarenal abdominal aortic aneurysm   01/27/2016 Imaging   CH chest showed no acute cardiopulmonary disease. Known stable retrocrural and periaortic adenopathy. Oval density over the left neck base measuring 2.5 x 4.5 cm likely due partially to prominent internal jugular vein, although cannot exclude adenopathy in this region. Recommend neck CT with contrast. Three vessel atherosclerotic coronary artery disease. Few small thyroid nodules with the largest measuring 1.2 cm over the left lobe. 2.4 cm left renal cyst. Sub cm right renal hypodensity unchanged and too small to characterize but likely a cyst. Minimal cholelithiasis.   01/31/2016 Imaging   ECHO showed: Left ventricle: The cavity  size was normal. Systolic function was normal. The  estimated ejection fraction was in the range of 60% to 65%. Wall motion was normal; there were no regional wall motion abnormalities. There was an increased relative contribution of atrial contraction to ventricular filling. Doppler parameters are consistent with abnormal left ventricular relaxation (grade 1 diastolic dysfunction).   02/07/2016 Procedure   He had imaging guided biopsy of supraclavicular lymph node   02/07/2016 Pathology Results   Accession: [id number] Core biopsies reveal diffuse areas of large atypical lymphoid cells. There are multiple cells with very large irregular nuclei or multinucleated cells. Immunohistochemistry reveals the cells are positive for CD20, CD45, bcl-2, and bcl-6. They are negative for CD10, CD30, and ALK. CD3, CD4, CD5 and CD8 reveal scattered T-cells. Ki-67 reveals an elevated proliferation rate (95%). The cells are negative for cytokeratin 7, cytokeratin 20, TTF-1, CDX-2, HMB45, SOX11, and MelanA. 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 He received cycle 1 of chemotherapy will hospitalized   02/14/2016 Imaging   Ct abdomen and pelvis showed widespread retrocrural, retroperitoneal, and bilateral pelvic lymphadenopathy, waxing/waning when compared to recent CT, as above. Mild right hydronephrosis, likely secondary to extrinsic compression by right retroperitoneal lymphadenopathy, with associated diminished enhancement of the right kidney. Spleen is normal in size.   02/16/2016 Bone Marrow Biopsy   Bone marrow biopsy was negative for lymphoma involvement. Cytogenetics came back abnormal: -Y in 45% of cell lines   02/16/2016 Procedure   He had port placement   02/16/2016 - 02/21/2016 Chemotherapy   He received cycle 1 of R-EPOCH    03/08/2016 - 03/12/2016 Hospital Admission   The patient received cycle 2 of chemotherapy in the  hospital   03/27/2016 PET scan   Significant decrease in size of abdominal and pelvic lymphadenopathy compared to previous CT, which shows low-grade metabolic activity. No evidence of active lymphoma within the neck or chest. 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 Continued annual follow-up by CT is recommended.    04/03/2016 - 04/07/2016 Hospital Admission   The patient received cycle 3 of chemotherapy in the hospital   04/20/2016 Procedure   He underwent successful intrathecal injection of chemotherapy without complication   10/01/6376 Pathology Results   [id number] CSF fluid negative for malignant cells   04/24/2016 - 04/28/2016 Hospital Admission   The patient received cycle 4 of chemotherapy in the hospital   05/15/2016 - 05/19/2016 Hospital Admission   He received cycle 5 of chemotherapy in the hospital   05/17/2016 Procedure   Successful intrathecal injection of methotrexate.   06/05/2016 - 06/09/2016 Hospital Admission   He is admitted to the hospital for cycle 6 of chemotherapy   06/07/2016 Procedure   Successful intrathecal chemotherapy injection   06/27/2016 PET scan   Further reduction in size and metabolic activity of lower abdominal and pelvic lymph nodes indicating response to therapy. 2. 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 3. Other imaging findings of potential clinical significance: Mucous retention cyst in the left maxillary sinus. Right mastoid effusion. Left renal cyst. Coronary, aortic arch, and branch vessel atherosclerotic vascular disease. Aortoiliac atherosclerotic vascular disease. Prominent stool throughout the colon favors constipation. Indirect left inguinal hernia containing adipose tissue. Cholelithiasis.   06/28/2016 Procedure   Intrathecal injection of chemotherapy without complication   7/41/2878 PET scan   1. Small LEFT external iliac lymph node is increased  metabolic activity compared to prior (similar to liver activity (  [place] 3). No additional hypermetabolic adenopathy in the chest, abdomen, or pelvis. Recommend attention on follow-up. 2. Normal spleen and marrow.   12/19/2016 PET scan   1. No abnormal hypermetabolism in the neck, chest, abdomen or pelvis. 2. Aortic atherosclerosis (ICD10-170.0). Three-vessel coronary artery calcification. 3. Right common iliac artery aneurysm. 4. Cholelithiasis.   07/08/2017 Imaging   1. No definite findings to suggest recurrent lymphoma in the chest, abdomen or pelvis. 2. Spectrum of findings in the right lung, most evident in the right lower lobe, suggestive of very mild bronchopneumonia. 3. Aortic atherosclerosis, in addition to three-vessel coronary artery disease. Assessment for potential risk factor modification, dietary therapy or pharmacologic therapy may be warranted, if clinically indicated. 4. In addition, there is aneurysmal dilatation of the right common iliac artery which is larger than the prior study, currently measuring 3.0 x 2.8 cm. 5. Left inguinal hernia containing fat. 6. Additional incidental findings, as above.  Aortic Atherosclerosis (ICD10-I70.0).   02/14/2018 Imaging   1. Asymmetric soft tissue swelling with inflammatory stranding involving the right face as above, consistent with acute infection/cellulitis. Cellulitic infections at this location are often odontogenic in origin, and note is made of dehiscence of the alveolar ridge of the right first maxillary molar, which could serve as a source of infection. No discrete abscess or drainable fluid collection identified. 2. Mild asymmetric prominence of subcentimeter right cervical lymph nodes, favored to be reactive in nature. No pathologically enlarged adenopathy within the neck. 3. Chronic right-sided paranasal sinus disease as above.   07/28/2018 Imaging   1. No current findings of active lymphoma. 2. Right common iliac artery aneurysm, 2.8 cm in diameter, surveillance suggested. 3. Other  imaging findings of potential clinical significance: Aortic Atherosclerosis (ICD10-I70.0). Coronary atherosclerosis. Left thyroid nodule, present but not hypermetabolic on prior PET-CT hence likely benign. Aortic valve calcification. Gynecomastia. Faint chronic nodularity in the right upper lobe, felt to be benign. Airway thickening is present, suggesting bronchitis or reactive airways disease. Borderline appearance for constipation. Bilateral direct inguinal hernias contain adipose tissue. Lumbar impingement at several levels due to spondylosis and degenerative disc disease.   09/16/2018 Procedure   Removal of implanted Port-A-Cath utilizing sharp and blunt dissection. The procedure was uncomplicated.   08/05/2019 Imaging   No suspicious lymphadenopathy in the chest, abdomen, or pelvis.   Spleen is normal in size.   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 Continued attention on follow-up is suggested.   Additional stable ancillary findings as above     REVIEW OF SYSTEMS:   Constitutional: Denies fevers, chills or abnormal weight loss Eyes: Denies blurriness of vision Ears, nose, mouth, throat, and face: Denies mucositis or sore throat Respiratory: Denies cough, dyspnea or wheezes Cardiovascular: Denies palpitation, chest discomfort or lower extremity swelling Gastrointestinal:  Denies nausea, heartburn or change in bowel habits Skin: Denies abnormal skin rashes Lymphatics: Denies new lymphadenopathy or easy bruising Neurological:Denies numbness, tingling or new weaknesses Behavioral/Psych: Mood is stable, no new changes  All other systems were reviewed with the patient and are negative.  I have reviewed the past medical history, past surgical history, social history and family history with the patient and they are unchanged from previous note.  ALLERGIES:  is allergic to bee venom and sulfa drugs cross reactors.  MEDICATIONS:  Current Outpatient Medications  Medication Sig  Dispense Refill  . amLODipine (NORVASC) 10 MG tablet Take 10 mg by mouth every morning.     Marland Kitchen  atorvastatin (LIPITOR) 40 MG tablet Take 40 mg by mouth every evening.     . Cholecalciferol (VITAMIN D3) 25 MCG TABS Take 50 mcg by mouth.    . clopidogrel (PLAVIX) 75 MG tablet Take 75 mg by mouth daily.    Marland Kitchen lisinopril (PRINIVIL,ZESTRIL) 5 MG tablet daily.     . magnesium oxide (MAG-OX) 400 MG tablet Take 400 mg by mouth 2 (two) times daily.    . metFORMIN (GLUCOPHAGE) 500 MG tablet Take 500 mg by mouth 2 (two) times daily with a meal.     No current facility-administered medications for this visit.    PHYSICAL EXAMINATION: ECOG PERFORMANCE STATUS: 0 - Asymptomatic  Vitals:   08/04/20 0914  BP: 137/68  Pulse: 82  Resp: 18  Temp: 98 F (36.7 C)  SpO2: 95%   Filed Weights   08/04/20 0914  Weight: 220 lb (99.8 kg)    GENERAL:alert, no distress and comfortable SKIN: skin color, texture, turgor are normal, no rashes or significant lesions EYES: normal, Conjunctiva are pink and non-injected, sclera clear OROPHARYNX:no exudate, no erythema and lips, buccal mucosa, and tongue normal  NECK: supple, thyroid normal size, non-tender, without nodularity LYMPH:  no palpable lymphadenopathy in the cervical, axillary or inguinal LUNGS: clear to auscultation and percussion with normal breathing effort HEART: regular rate & rhythm and no murmurs and no lower extremity edema ABDOMEN:abdomen soft, non-tender and normal bowel sounds Musculoskeletal:no cyanosis of digits and no clubbing.  Noted gynecomastia  NEURO: alert & oriented x 3 with fluent speech, no focal motor/sensory deficits  LABORATORY DATA:  I have reviewed the data as listed    Component Value Date/Time   NA 141 08/04/2020 0847   NA 141 12/18/2016 1403   K 4.4 08/04/2020 0847   K 4.4 12/18/2016 1403   CL 102 08/04/2020 0847   CO2 28 08/04/2020 0847   CO2 28 12/18/2016 1403   GLUCOSE 131 (H) 08/04/2020 0847   GLUCOSE 125  12/18/2016 1403   BUN 17 08/04/2020 0847   BUN 19.3 12/18/2016 1403   CREATININE 0.92 08/04/2020 0847   CREATININE 0.82 07/28/2018 1057   CREATININE 1.0 12/18/2016 1403   CALCIUM 9.3 08/04/2020 0847   CALCIUM 9.6 12/18/2016 1403   PROT 6.9 08/04/2020 0847   PROT 6.4 12/18/2016 1403   ALBUMIN 4.2 08/04/2020 0847   ALBUMIN 3.9 12/18/2016 1403   AST 16 08/04/2020 0847   AST 22 07/28/2018 1057   AST 16 12/18/2016 1403   ALT 14 08/04/2020 0847   ALT 33 07/28/2018 1057   ALT 16 12/18/2016 1403   ALKPHOS 62 08/04/2020 0847   ALKPHOS 66 12/18/2016 1403   BILITOT 0.5 08/04/2020 0847   BILITOT 0.5 07/28/2018 1057   BILITOT 0.34 12/18/2016 1403   GFRNONAA >60 08/04/2020 0847   GFRNONAA >60 07/28/2018 1057   GFRAA >60 08/05/2019 1146   GFRAA >60 07/28/2018 1057    No results found for: SPEP, UPEP  Lab Results  Component Value Date   WBC 8.7 08/04/2020   NEUTROABS 4.8 08/04/2020   HGB 15.2 08/04/2020   HCT 44.5 08/04/2020   MCV 89.0 08/04/2020   PLT 199 08/04/2020      Chemistry      Component Value Date/Time   NA 141 08/04/2020 0847   NA 141 12/18/2016 1403   K 4.4 08/04/2020 0847   K 4.4 12/18/2016 1403   CL 102 08/04/2020 0847   CO2 28 08/04/2020 0847   CO2 28 12/18/2016 1403  BUN 17 08/04/2020 0847   BUN 19.3 12/18/2016 1403   CREATININE 0.92 08/04/2020 0847   CREATININE 0.82 07/28/2018 1057   CREATININE 1.0 12/18/2016 1403      Component Value Date/Time   CALCIUM 9.3 08/04/2020 0847   CALCIUM 9.6 12/18/2016 1403   ALKPHOS 62 08/04/2020 0847   ALKPHOS 66 12/18/2016 1403   AST 16 08/04/2020 0847   AST 22 07/28/2018 1057   AST 16 12/18/2016 1403   ALT 14 08/04/2020 0847   ALT 33 07/28/2018 1057   ALT 16 12/18/2016 1403   BILITOT 0.5 08/04/2020 0847   BILITOT 0.5 07/28/2018 1057   BILITOT 0.34 12/18/2016 1403

## 2020-08-04 NOTE — Telephone Encounter (Signed)
Scheduled per 5/5 sch msg. Called and spoke with pt confirmed 5/5 appts

## 2020-08-04 NOTE — Assessment & Plan Note (Signed)
Only that the patient has class I obesity, he has signs of gynecomastia, reflective of possible fatty liver disease The patient has moderate alcohol intake and I advised him to quit drinking, exercise more and to attempt aggressive dietary modification and weight loss

## 2020-08-05 ENCOUNTER — Other Ambulatory Visit: Payer: Medicare Other

## 2020-08-05 ENCOUNTER — Ambulatory Visit: Payer: Medicare Other | Admitting: Hematology and Oncology

## 2020-08-17 ENCOUNTER — Other Ambulatory Visit: Payer: Self-pay

## 2020-08-17 DIAGNOSIS — I6523 Occlusion and stenosis of bilateral carotid arteries: Secondary | ICD-10-CM

## 2020-09-05 DIAGNOSIS — I779 Disorder of arteries and arterioles, unspecified: Secondary | ICD-10-CM | POA: Diagnosis not present

## 2020-09-05 DIAGNOSIS — E1151 Type 2 diabetes mellitus with diabetic peripheral angiopathy without gangrene: Secondary | ICD-10-CM | POA: Diagnosis not present

## 2020-09-05 DIAGNOSIS — I700 Atherosclerosis of aorta: Secondary | ICD-10-CM | POA: Diagnosis not present

## 2020-09-05 DIAGNOSIS — Z7984 Long term (current) use of oral hypoglycemic drugs: Secondary | ICD-10-CM | POA: Diagnosis not present

## 2020-09-05 DIAGNOSIS — J449 Chronic obstructive pulmonary disease, unspecified: Secondary | ICD-10-CM | POA: Diagnosis not present

## 2020-09-05 DIAGNOSIS — E7800 Pure hypercholesterolemia, unspecified: Secondary | ICD-10-CM | POA: Diagnosis not present

## 2020-09-05 DIAGNOSIS — I739 Peripheral vascular disease, unspecified: Secondary | ICD-10-CM | POA: Diagnosis not present

## 2020-09-05 DIAGNOSIS — I10 Essential (primary) hypertension: Secondary | ICD-10-CM | POA: Diagnosis not present

## 2020-09-05 DIAGNOSIS — I639 Cerebral infarction, unspecified: Secondary | ICD-10-CM | POA: Diagnosis not present

## 2020-09-05 DIAGNOSIS — C8510 Unspecified B-cell lymphoma, unspecified site: Secondary | ICD-10-CM | POA: Diagnosis not present

## 2020-09-14 DIAGNOSIS — E7800 Pure hypercholesterolemia, unspecified: Secondary | ICD-10-CM | POA: Diagnosis not present

## 2020-09-14 DIAGNOSIS — I10 Essential (primary) hypertension: Secondary | ICD-10-CM | POA: Diagnosis not present

## 2020-09-14 DIAGNOSIS — N400 Enlarged prostate without lower urinary tract symptoms: Secondary | ICD-10-CM | POA: Diagnosis not present

## 2020-09-14 DIAGNOSIS — E1151 Type 2 diabetes mellitus with diabetic peripheral angiopathy without gangrene: Secondary | ICD-10-CM | POA: Diagnosis not present

## 2020-09-14 DIAGNOSIS — J449 Chronic obstructive pulmonary disease, unspecified: Secondary | ICD-10-CM | POA: Diagnosis not present

## 2020-09-14 DIAGNOSIS — I639 Cerebral infarction, unspecified: Secondary | ICD-10-CM | POA: Diagnosis not present

## 2020-09-28 ENCOUNTER — Ambulatory Visit: Payer: Medicare HMO | Admitting: Vascular Surgery

## 2020-09-28 ENCOUNTER — Ambulatory Visit (HOSPITAL_COMMUNITY)
Admission: RE | Admit: 2020-09-28 | Discharge: 2020-09-28 | Disposition: A | Discharge status: Home / Self Care | Payer: Medicare HMO | Source: Ambulatory Visit | Attending: Vascular Surgery | Admitting: Vascular Surgery

## 2020-09-28 ENCOUNTER — Other Ambulatory Visit: Payer: Self-pay

## 2020-09-28 ENCOUNTER — Encounter: Payer: Self-pay | Admitting: Vascular Surgery

## 2020-09-28 VITALS — BP 143/73 | HR 78 | Temp 98.2°F | Resp 20 | Ht 70.0 in | Wt 219.0 lb

## 2020-09-28 DIAGNOSIS — I723 Aneurysm of iliac artery: Secondary | ICD-10-CM | POA: Diagnosis not present

## 2020-09-28 DIAGNOSIS — I6521 Occlusion and stenosis of right carotid artery: Secondary | ICD-10-CM

## 2020-09-28 DIAGNOSIS — I6523 Occlusion and stenosis of bilateral carotid arteries: Secondary | ICD-10-CM

## 2020-09-28 NOTE — Progress Notes (Signed)
REASON FOR VISIT:   Follow-up of carotid disease and right common iliac artery aneurysm  MEDICAL ISSUES:   S/P RIGHT CAROTID ENDARTERECTOMY: The patient has no evidence of recurrent carotid stenosis on the right and no significant left carotid stenosis.  He is on Plavix and a statin.  He is not a smoker.  I ordered a follow-up carotid duplex scan in 1 year and I will see him back at that time.  He knows to call sooner if he has problems.  RIGHT COMMON ILIAC ARTERY ANEURYSM: The patient's most recent CT scan showed that the maximum diameter of his right common iliac artery was 2.7 cm.  He did not have an abdominal aortic aneurysm.  I will arrange for a duplex of his abdominal aorta and common iliac arteries in 1 year when he returns for his follow-up carotid duplex scan next year.   HPI:   Samuel Berger is a pleasant 75 y.o. male who I last saw on 09/23/2019.  He had undergone a right carotid endarterectomy in 2013.  He was coming in for yearly follow-up visit.  His right carotid endarterectomy site was widely patent.  He had no significant stenosis on the left.  He had a CT scan prior to that visit which showed the maximum diameter of his right common iliac artery was 3.1 cm.  He comes in for routine follow-up visit.  Since I saw him last, he denies any history of stroke, TIAs, expressive or receptive aphasia, or amaurosis fugax.  He denies any significant abdominal pain or back pain.  He did tear his rotator cuff on the left side.  This is limiting his ability to play golf somewhat.   Past Medical History:  Diagnosis Date   Aneurysm artery, iliac (HCC)    Carotid artery occlusion    Dyslipidemia    GERD (gastroesophageal reflux disease)    Hepatitis    ? age 85 or 44   Hyperlipidemia    Hypertension    Iliac artery aneurysm, right ([place])    large b cell lymphoma dx'd 02/2016   Stroke ([place])    01-05-2012   02-2012   Type II or unspecified type diabetes mellitus without  mention of complication, not stated as uncontrolled     Family History  Problem Relation Age of Onset   Diabetes Mother    Lung cancer Father     SOCIAL HISTORY: Social History   Tobacco Use   Smoking status: Former    Packs/day: 0.50    Years: 30.00    Pack years: 15.00    Types: Cigars, Cigarettes    Quit date: 01/05/2012    Years since quitting: 8.7   Smokeless tobacco: Former  Substance Use Topics   Alcohol use: Yes    Alcohol/week: 3.0 standard drinks    Types: 1 Glasses of wine, 1 Cans of beer, 1 Shots of liquor per week    Comment: quit 2 months..Patient drinks coffee daily.OCCASIONAL DRINKER    Allergies  Allergen Reactions   Bee Venom Hives   Sulfa Drugs Cross Reactors Other (See Comments)    Unknown    Current Outpatient Medications  Medication Sig Dispense Refill   amLODipine (NORVASC) 10 MG tablet Take 10 mg by mouth every morning.      atorvastatin (LIPITOR) 40 MG tablet Take 40 mg by mouth every evening.      Cholecalciferol (VITAMIN D3) 25 MCG TABS Take 50 mcg by mouth.     clopidogrel (  PLAVIX) 75 MG tablet Take 75 mg by mouth daily.     lisinopril (PRINIVIL,ZESTRIL) 5 MG tablet daily.      magnesium oxide (MAG-OX) 400 MG tablet Take 400 mg by mouth 2 (two) times daily.     metFORMIN (GLUCOPHAGE) 500 MG tablet Take 500 mg by mouth 2 (two) times daily with a meal.     No current facility-administered medications for this visit.    REVIEW OF SYSTEMS:  [X]  denotes positive finding, [ ]  denotes negative finding Cardiac  Comments:  Chest pain or chest pressure:    Shortness of breath upon exertion:    Short of breath when lying flat:    Irregular heart rhythm:        Vascular    Pain in calf, thigh, or hip brought on by ambulation:    Pain in feet at night that wakes you up from your sleep:     Blood clot in your veins:    Leg swelling:         Pulmonary    Oxygen at home:    Productive cough:     Wheezing:         Neurologic    Sudden  weakness in arms or legs:     Sudden numbness in arms or legs:     Sudden onset of difficulty speaking or slurred speech:    Temporary loss of vision in one eye:     Problems with dizziness:         Gastrointestinal    Blood in stool:     Vomited blood:         Genitourinary    Burning when urinating:     Blood in urine:        Psychiatric    Major depression:         Hematologic    Bleeding problems:    Problems with blood clotting too easily:        Skin    Rashes or ulcers:        Constitutional    Fever or chills:     PHYSICAL EXAM:   Vitals:   09/28/20 1026 09/28/20 1029  BP: 138/75 (!) 143/73  Pulse: 78   Resp: 20   Temp: 98.2 F (36.8 C)   Weight: 219 lb (99.3 kg)   Height: 5\' 10"  (1.778 m)     GENERAL: The patient is a well-nourished male, in no acute distress. The vital signs are documented above. CARDIAC: There is a regular rate and rhythm.  VASCULAR: I do not detect carotid bruits. He has palpable femoral pulses. Both feet are warm and well-perfused. PULMONARY: There is good air exchange bilaterally without wheezing or rales. ABDOMEN: Soft and non-tender with normal pitched bowel sounds.  MUSCULOSKELETAL: There are no major deformities or cyanosis. NEUROLOGIC: No focal weakness or paresthesias are detected. SKIN: There are no ulcers or rashes noted. PSYCHIATRIC: The patient has a normal affect.  DATA:    CAROTID DUPLEX: I have independently interpreted his carotid duplex scan today.  On the right side there is a less than 39% stenosis.  The right vertebral artery has antegrade flow.  On the left side there is a less than 39% stenosis.  The left vertebral artery has antegrade flow.  Deitra Mayo [facility] [phone]

## 2020-11-13 DIAGNOSIS — E1151 Type 2 diabetes mellitus with diabetic peripheral angiopathy without gangrene: Secondary | ICD-10-CM | POA: Diagnosis not present

## 2020-11-13 DIAGNOSIS — E7800 Pure hypercholesterolemia, unspecified: Secondary | ICD-10-CM | POA: Diagnosis not present

## 2020-11-13 DIAGNOSIS — I639 Cerebral infarction, unspecified: Secondary | ICD-10-CM | POA: Diagnosis not present

## 2020-11-13 DIAGNOSIS — N400 Enlarged prostate without lower urinary tract symptoms: Secondary | ICD-10-CM | POA: Diagnosis not present

## 2020-11-13 DIAGNOSIS — I10 Essential (primary) hypertension: Secondary | ICD-10-CM | POA: Diagnosis not present

## 2020-11-13 DIAGNOSIS — J449 Chronic obstructive pulmonary disease, unspecified: Secondary | ICD-10-CM | POA: Diagnosis not present

## 2021-01-16 DIAGNOSIS — E1151 Type 2 diabetes mellitus with diabetic peripheral angiopathy without gangrene: Secondary | ICD-10-CM | POA: Diagnosis not present

## 2021-01-16 DIAGNOSIS — I639 Cerebral infarction, unspecified: Secondary | ICD-10-CM | POA: Diagnosis not present

## 2021-01-16 DIAGNOSIS — E7800 Pure hypercholesterolemia, unspecified: Secondary | ICD-10-CM | POA: Diagnosis not present

## 2021-01-16 DIAGNOSIS — N400 Enlarged prostate without lower urinary tract symptoms: Secondary | ICD-10-CM | POA: Diagnosis not present

## 2021-01-16 DIAGNOSIS — I10 Essential (primary) hypertension: Secondary | ICD-10-CM | POA: Diagnosis not present

## 2021-01-16 DIAGNOSIS — J449 Chronic obstructive pulmonary disease, unspecified: Secondary | ICD-10-CM | POA: Diagnosis not present

## 2021-03-15 ENCOUNTER — Ambulatory Visit
Admission: RE | Admit: 2021-03-15 | Discharge: 2021-03-15 | Disposition: A | Discharge status: Home / Self Care | Payer: Medicare HMO | Source: Ambulatory Visit | Attending: Internal Medicine | Admitting: Internal Medicine

## 2021-03-15 ENCOUNTER — Other Ambulatory Visit: Payer: Self-pay | Admitting: Internal Medicine

## 2021-03-15 DIAGNOSIS — C8510 Unspecified B-cell lymphoma, unspecified site: Secondary | ICD-10-CM | POA: Diagnosis not present

## 2021-03-15 DIAGNOSIS — J449 Chronic obstructive pulmonary disease, unspecified: Secondary | ICD-10-CM | POA: Diagnosis not present

## 2021-03-15 DIAGNOSIS — Z0000 Encounter for general adult medical examination without abnormal findings: Secondary | ICD-10-CM | POA: Diagnosis not present

## 2021-03-15 DIAGNOSIS — E1151 Type 2 diabetes mellitus with diabetic peripheral angiopathy without gangrene: Secondary | ICD-10-CM | POA: Diagnosis not present

## 2021-03-15 DIAGNOSIS — I10 Essential (primary) hypertension: Secondary | ICD-10-CM | POA: Diagnosis not present

## 2021-03-15 DIAGNOSIS — E7800 Pure hypercholesterolemia, unspecified: Secondary | ICD-10-CM | POA: Diagnosis not present

## 2021-03-15 DIAGNOSIS — I739 Peripheral vascular disease, unspecified: Secondary | ICD-10-CM | POA: Diagnosis not present

## 2021-03-15 DIAGNOSIS — I723 Aneurysm of iliac artery: Secondary | ICD-10-CM | POA: Diagnosis not present

## 2021-03-15 DIAGNOSIS — I700 Atherosclerosis of aorta: Secondary | ICD-10-CM | POA: Diagnosis not present

## 2021-03-15 DIAGNOSIS — N400 Enlarged prostate without lower urinary tract symptoms: Secondary | ICD-10-CM | POA: Diagnosis not present

## 2021-03-15 DIAGNOSIS — R0989 Other specified symptoms and signs involving the circulatory and respiratory systems: Secondary | ICD-10-CM | POA: Diagnosis not present

## 2021-05-09 ENCOUNTER — Other Ambulatory Visit: Payer: Self-pay

## 2021-05-09 DIAGNOSIS — I739 Peripheral vascular disease, unspecified: Secondary | ICD-10-CM

## 2021-05-10 ENCOUNTER — Ambulatory Visit (HOSPITAL_COMMUNITY)
Admission: RE | Admit: 2021-05-10 | Discharge: 2021-05-10 | Disposition: A | Discharge status: Home / Self Care | Payer: Medicare HMO | Source: Ambulatory Visit | Attending: Vascular Surgery | Admitting: Vascular Surgery

## 2021-05-10 ENCOUNTER — Other Ambulatory Visit: Payer: Self-pay

## 2021-05-10 ENCOUNTER — Encounter: Payer: Self-pay | Admitting: Vascular Surgery

## 2021-05-10 ENCOUNTER — Ambulatory Visit: Payer: Medicare HMO | Admitting: Vascular Surgery

## 2021-05-10 VITALS — BP 145/75 | HR 81 | Temp 98.2°F | Resp 16 | Ht 71.0 in | Wt 220.0 lb

## 2021-05-10 DIAGNOSIS — I739 Peripheral vascular disease, unspecified: Secondary | ICD-10-CM | POA: Insufficient documentation

## 2021-05-10 DIAGNOSIS — I872 Venous insufficiency (chronic) (peripheral): Secondary | ICD-10-CM

## 2021-05-10 DIAGNOSIS — M7989 Other specified soft tissue disorders: Secondary | ICD-10-CM

## 2021-05-10 NOTE — Progress Notes (Signed)
REASON FOR VISIT:   Left lower extremity swelling for 2 months.  MEDICAL ISSUES:   CHRONIC VENOUS INSUFFICIENCY: This patient has bilateral lower extremity swelling which is worse on the left side.  I think this is secondary to chronic venous insufficiency.  He has hyperpigmentation consistent with CEAP C4.  He has deep venous reflux on the left but no significant superficial venous reflux.  We have discussed the importance of intermittent leg elevation and the proper positioning for this.  In addition I recommended that he wear a knee-high compression stockings with a gradient of 15 to 20 mmHg.  I encouraged him to avoid prolonged sitting and standing.  We discussed the importance of exercise specifically walking and water aerobics.  We also discussed the importance of maintaining healthy weight.  Currently he is not a candidate for laser ablation as he has no significant superficial venous reflux.  I will plan on seeing him back in June when he comes in for his routine follow-up disease related to his carotids and right common iliac artery aneurysm.  STATUS POST RIGHT CAROTID ENDARTERECTOMY: At the time of his most recent follow-up visit his right carotid endarterectomy site was widely patent.  He had no significant stenosis on the left.  He is due for a follow-up carotid duplex scan in June of this year.  RIGHT COMMON ILIAC ARTERY ANEURYSM: We are also following him with a small 2.7 cm right common iliac artery aneurysm.  He is due for a follow-up duplex scan in June this year.  HPI:   Samuel Berger is a pleasant 76 y.o. male who I last saw on 09/28/2020 for follow-up of his carotid disease and right common iliac artery aneurysm.  He is undergone a previous right carotid endarterectomy in 2013.  At his most recent visit there was no evidence of recurrent stenosis on the right and no significant left carotid stenosis.  He was set up for a 1 year follow-up visit.  In addition we are following  him with a small right common iliac artery aneurysm which measured 2.7 cm in maximum diameter.  He was scheduled for a follow-up duplex scan in 1 year.  Approximately 1 to 2 months ago he developed increasing swelling in the left leg.  He was not having significant pain with this.  He comes in for further evaluation.  The patient denies any previous history of DVT.  He has had no previous venous procedures.  He does not wear compression stockings.  He does occasionally elevate his legs.  The swelling is not especially symptomatic.  He denies significant aching pain or heaviness.  The swelling progresses during the day.  Past Medical History:  Diagnosis Date   Aneurysm artery, iliac (HCC)    Carotid artery occlusion    Dyslipidemia    GERD (gastroesophageal reflux disease)    Hepatitis    ? age 16 or 1   Hyperlipidemia    Hypertension    Iliac artery aneurysm, right ([place])    large b cell lymphoma dx'd 02/2016   Stroke ([place])    01-05-2012   02-2012   Type II or unspecified type diabetes mellitus without mention of complication, not stated as uncontrolled     Family History  Problem Relation Age of Onset   Diabetes Mother    Lung cancer Father     SOCIAL HISTORY: Social History   Tobacco Use   Smoking status: Former    Packs/day: 0.50  Years: 30.00    Pack years: 15.00    Types: Cigars, Cigarettes    Quit date: 01/05/2012    Years since quitting: 9.3   Smokeless tobacco: Former  Substance Use Topics   Alcohol use: Yes    Alcohol/week: 3.0 standard drinks    Types: 1 Glasses of wine, 1 Cans of beer, 1 Shots of liquor per week    Comment: quit 2 months..Patient drinks coffee daily.OCCASIONAL DRINKER    Allergies  Allergen Reactions   Bee Venom Hives   Sulfa Drugs Cross Reactors Other (See Comments)    Unknown    Current Outpatient Medications  Medication Sig Dispense Refill   amLODipine (NORVASC) 10 MG tablet Take 10 mg by mouth every morning.      atorvastatin  (LIPITOR) 40 MG tablet Take 40 mg by mouth every evening.      Cholecalciferol (VITAMIN D3) 25 MCG TABS Take 50 mcg by mouth.     clopidogrel (PLAVIX) 75 MG tablet Take 75 mg by mouth daily.     lisinopril (PRINIVIL,ZESTRIL) 5 MG tablet daily.      magnesium oxide (MAG-OX) 400 MG tablet Take 400 mg by mouth 2 (two) times daily.     metFORMIN (GLUCOPHAGE) 500 MG tablet Take 500 mg by mouth 2 (two) times daily with a meal.     No current facility-administered medications for this visit.    REVIEW OF SYSTEMS:  [X]  denotes positive finding, [ ]  denotes negative finding Cardiac  Comments:  Chest pain or chest pressure:    Shortness of breath upon exertion:    Short of breath when lying flat:    Irregular heart rhythm:        Vascular    Pain in calf, thigh, or hip brought on by ambulation:    Pain in feet at night that wakes you up from your sleep:     Blood clot in your veins:    Leg swelling:         Pulmonary    Oxygen at home:    Productive cough:     Wheezing:         Neurologic    Sudden weakness in arms or legs:     Sudden numbness in arms or legs:     Sudden onset of difficulty speaking or slurred speech:    Temporary loss of vision in one eye:     Problems with dizziness:         Gastrointestinal    Blood in stool:     Vomited blood:         Genitourinary    Burning when urinating:     Blood in urine:        Psychiatric    Major depression:         Hematologic    Bleeding problems:    Problems with blood clotting too easily:        Skin    Rashes or ulcers:        Constitutional    Fever or chills:     PHYSICAL EXAM:   Vitals:   05/10/21 1347  BP: (!) 145/75  Pulse: 81  Resp: 16  Temp: 98.2 F (36.8 C)  TempSrc: Temporal  SpO2: 97%  Weight: 220 lb (99.8 kg)  Height: 5\' 11"  (1.803 m)    GENERAL: The patient is a well-nourished male, in no acute distress. The vital signs are documented above. CARDIAC: There is a regular rate and rhythm.  VASCULAR: I do not detect carotid bruits. He has palpable femoral pulses.  I cannot palpate pedal pulses however both feet are warm and well-perfused. He has mild bilateral lower extremity swelling which is more significant on the left side.  He has hyperpigmentation bilaterally consistent with chronic venous insufficiency.   PULMONARY: There is good air exchange bilaterally without wheezing or rales. ABDOMEN: Soft and non-tender with normal pitched bowel sounds.  MUSCULOSKELETAL: There are no major deformities or cyanosis. NEUROLOGIC: No focal weakness or paresthesias are detected. SKIN: There are no ulcers or rashes noted. PSYCHIATRIC: The patient has a normal affect.  DATA:    VENOUS DUPLEX: I have independently interpreted his venous duplex scan today.  This was of the left lower extremity only.  There was no evidence of DVT on the left.  There was deep venous reflux involving the common femoral vein.  There was no significant superficial venous reflux.   Deitra Mayo [facility] [phone]

## 2021-05-18 DIAGNOSIS — M7989 Other specified soft tissue disorders: Secondary | ICD-10-CM

## 2021-06-15 DIAGNOSIS — B079 Viral wart, unspecified: Secondary | ICD-10-CM | POA: Diagnosis not present

## 2021-06-15 DIAGNOSIS — B078 Other viral warts: Secondary | ICD-10-CM | POA: Diagnosis not present

## 2021-06-15 DIAGNOSIS — Z85828 Personal history of other malignant neoplasm of skin: Secondary | ICD-10-CM | POA: Diagnosis not present

## 2021-08-02 ENCOUNTER — Other Ambulatory Visit: Payer: Self-pay

## 2021-08-02 DIAGNOSIS — C8338 Diffuse large B-cell lymphoma, lymph nodes of multiple sites: Secondary | ICD-10-CM

## 2021-08-03 DIAGNOSIS — I10 Essential (primary) hypertension: Secondary | ICD-10-CM | POA: Diagnosis not present

## 2021-08-03 DIAGNOSIS — H5203 Hypermetropia, bilateral: Secondary | ICD-10-CM | POA: Diagnosis not present

## 2021-08-04 ENCOUNTER — Encounter: Payer: Self-pay | Admitting: Hematology and Oncology

## 2021-08-04 ENCOUNTER — Inpatient Hospital Stay: Payer: Medicare HMO | Attending: Hematology and Oncology | Admitting: Hematology and Oncology

## 2021-08-04 ENCOUNTER — Inpatient Hospital Stay: Payer: Medicare HMO

## 2021-08-04 ENCOUNTER — Other Ambulatory Visit: Payer: Self-pay

## 2021-08-04 VITALS — BP 145/72 | HR 93 | Temp 97.7°F | Resp 18 | Ht 71.0 in | Wt 213.0 lb

## 2021-08-04 DIAGNOSIS — E1151 Type 2 diabetes mellitus with diabetic peripheral angiopathy without gangrene: Secondary | ICD-10-CM | POA: Diagnosis not present

## 2021-08-04 DIAGNOSIS — Z8572 Personal history of non-Hodgkin lymphomas: Secondary | ICD-10-CM

## 2021-08-04 DIAGNOSIS — E118 Type 2 diabetes mellitus with unspecified complications: Secondary | ICD-10-CM

## 2021-08-04 DIAGNOSIS — I10 Essential (primary) hypertension: Secondary | ICD-10-CM | POA: Diagnosis not present

## 2021-08-04 DIAGNOSIS — C8338 Diffuse large B-cell lymphoma, lymph nodes of multiple sites: Secondary | ICD-10-CM

## 2021-08-04 DIAGNOSIS — R600 Localized edema: Secondary | ICD-10-CM | POA: Diagnosis not present

## 2021-08-04 DIAGNOSIS — J449 Chronic obstructive pulmonary disease, unspecified: Secondary | ICD-10-CM | POA: Diagnosis not present

## 2021-08-04 DIAGNOSIS — E7800 Pure hypercholesterolemia, unspecified: Secondary | ICD-10-CM | POA: Diagnosis not present

## 2021-08-04 LAB — CBC WITH DIFFERENTIAL (CANCER CENTER ONLY)
Abs Immature Granulocytes: 0.02 10*3/uL (ref 0.00–0.07)
Basophils Absolute: 0.1 10*3/uL (ref 0.0–0.1)
Basophils Relative: 1 %
Eosinophils Absolute: 0.6 10*3/uL — ABNORMAL HIGH (ref 0.0–0.5)
Eosinophils Relative: 8 %
HCT: 48.7 % (ref 39.0–52.0)
Hemoglobin: 16.5 g/dL (ref 13.0–17.0)
Immature Granulocytes: 0 %
Lymphocytes Relative: 24 %
Lymphs Abs: 1.8 10*3/uL (ref 0.7–4.0)
MCH: 30 pg (ref 26.0–34.0)
MCHC: 33.9 g/dL (ref 30.0–36.0)
MCV: 88.5 fL (ref 80.0–100.0)
Monocytes Absolute: 1.1 10*3/uL — ABNORMAL HIGH (ref 0.1–1.0)
Monocytes Relative: 14 %
Neutro Abs: 4.1 10*3/uL (ref 1.7–7.7)
Neutrophils Relative %: 53 %
Platelet Count: 203 10*3/uL (ref 150–400)
RBC: 5.5 MIL/uL (ref 4.22–5.81)
RDW: 13 % (ref 11.5–15.5)
WBC Count: 7.7 10*3/uL (ref 4.0–10.5)
nRBC: 0 % (ref 0.0–0.2)

## 2021-08-04 LAB — CMP (CANCER CENTER ONLY)
ALT: 15 U/L (ref 0–44)
AST: 15 U/L (ref 15–41)
Albumin: 4.2 g/dL (ref 3.5–5.0)
Alkaline Phosphatase: 55 U/L (ref 38–126)
Anion gap: 7 (ref 5–15)
BUN: 18 mg/dL (ref 8–23)
CO2: 30 mmol/L (ref 22–32)
Calcium: 9.3 mg/dL (ref 8.9–10.3)
Chloride: 102 mmol/L (ref 98–111)
Creatinine: 0.89 mg/dL (ref 0.61–1.24)
GFR, Estimated: >60 mL/min (ref >60)
Glucose, Bld: 152 mg/dL — ABNORMAL HIGH (ref 70–99)
Potassium: 4.4 mmol/L (ref 3.5–5.1)
Sodium: 139 mmol/L (ref 135–145)
Total Bilirubin: 0.7 mg/dL (ref 0.3–1.2)
Total Protein: 6.8 g/dL (ref 6.5–8.1)

## 2021-08-04 LAB — LACTATE DEHYDROGENASE: LDH: 130 U/L (ref 98–192)

## 2021-08-04 NOTE — Assessment & Plan Note (Signed)
He has chronic bilateral lower extremity edema due to peripheral vascular disease and veins ?He will continue close monitoring and follow-up with vascular surgery ?

## 2021-08-04 NOTE — Assessment & Plan Note (Signed)
Clinically, he has no signs or symptoms to suggest cancer recurrence ?His examination and blood work were normal ?I do not recommend routine surveillance imaging study ?I will see him again in a year for further follow-up ?

## 2021-08-04 NOTE — Progress Notes (Signed)
[place] ?OFFICE PROGRESS NOTE ? ?Patient Care Team: ?Wenda Low, MD as PCP - General (Internal Medicine) ? ?ASSESSMENT & PLAN:  ?History of lymphoma ?Clinically, he has no signs or symptoms to suggest cancer recurrence ?His examination and blood work were normal ?I do not recommend routine surveillance imaging study ?I will see him again in a year for further follow-up ? ?Type 2 diabetes mellitus with complication, without long-term current use of insulin ([place]) ?The patient has significant cardiovascular risk factors ?He is aware about association between diabetes and cancer ?He is actively trying to modify his diet and lifestyle and has lost some weight ?I recommend the patient to continue his best effort ? ?Bilateral leg edema ?He has chronic bilateral lower extremity edema due to peripheral vascular disease and veins ?He will continue close monitoring and follow-up with vascular surgery ? ?Orders Placed This Encounter  ?Procedures  ? CBC with Differential ([place] Only)  ?  Standing Status:   Future  ?  Standing Expiration Date:   08/05/2022  ? CMP ([place] only)  ?  Standing Status:   Future  ?  Standing Expiration Date:   08/05/2022  ? ? ?All questions were answered. The patient knows to call the clinic with any problems, questions or concerns. ?The total time spent in the appointment was 20 minutes encounter with patients including review of chart and various tests results, discussions about plan of care and coordination of care plan ?  ?Heath Lark, MD ?08/04/2021 1:33 PM ? ?INTERVAL HISTORY: ?Please see below for problem oriented charting. ?he returns for surveillance follow-up for history of lymphoma ?He is doing well ?He has lost 7 pounds since last time I saw him ?No new lymphadenopathy ?No recent infection ? ?REVIEW OF SYSTEMS:   ?Constitutional: Denies fevers, chills or abnormal weight loss ?Eyes: Denies blurriness of vision ?Ears, nose, mouth, throat, and face: Denies  mucositis or sore throat ?Respiratory: Denies cough, dyspnea or wheezes ?Cardiovascular: Denies palpitation, chest discomfort ?Gastrointestinal:  Denies nausea, heartburn or change in bowel habits ?Skin: Denies abnormal skin rashes ?Lymphatics: Denies new lymphadenopathy or easy bruising ?Neurological:Denies numbness, tingling or new weaknesses ?Behavioral/Psych: Mood is stable, no new changes  ?All other systems were reviewed with the patient and are negative. ? ?I have reviewed the past medical history, past surgical history, social history and family history with the patient and they are unchanged from previous note. ? ?ALLERGIES:  is allergic to bee venom and sulfa drugs cross reactors. ? ?MEDICATIONS:  ?Current Outpatient Medications  ?Medication Sig Dispense Refill  ? amLODipine (NORVASC) 10 MG tablet Take 10 mg by mouth every morning.     ? atorvastatin (LIPITOR) 40 MG tablet Take 40 mg by mouth every evening.     ? Cholecalciferol (VITAMIN D3) 25 MCG TABS Take 50 mcg by mouth.    ? clopidogrel (PLAVIX) 75 MG tablet Take 75 mg by mouth daily.    ? lisinopril (PRINIVIL,ZESTRIL) 5 MG tablet daily.     ? magnesium oxide (MAG-OX) 400 MG tablet Take 400 mg by mouth 2 (two) times daily.    ? metFORMIN (GLUCOPHAGE) 500 MG tablet Take 500 mg by mouth 2 (two) times daily with a meal.    ? ?No current facility-administered medications for this visit.  ? ? ?SUMMARY OF ONCOLOGIC HISTORY: ?Oncology History Overview Note  ?NCCN-IPI ? ?Age: 33 points ?LDH ratio >1: 1 point ?ECOG >2 at presentation: 1 point ?[place] Stage III: 1 point ? ?Total  5 points ?High intermediate risk catergory: OS 38%, PFS 54% ? ?  ?History of lymphoma  ?03/07/2015 Imaging  ? Ct angiogram showed stable aneurysm of the right common iliac artery measuring up to 2.8 cm. Stable ectasia or post stenotic dilatation of the right external iliac artery measuring up to 1.5 cm. Extensive atherosclerotic disease involving the abdominal aorta with a chronic  intimal flap or short segment dissection in the infrarenal abdominal aorta. Chronic occlusion of the left common iliac artery. Slowly enlarging retroperitoneal lymph nodes as described. An indolent neoplastic or lymphoproliferative process cannot be excluded ? ?  ?01/06/2016 Imaging  ? Ct abdomen and pelvis showed new extensive centrally necrotic retroperitoneal and bilateral pelvic lymphadenopathy. Differential includes lymphoproliferative condition (lymphoma/leukemia) or infectious lymphadenitis (such as due to bacterial, viral or mycobacterial etiologies). Squamous cell carcinoma metastases can have a similar appearance, although this is less likely given the location. Clinical and laboratory evaluation for a lymphoproliferative condition and tissue sampling is advised. 2. Aortic atherosclerosis. Stable 3.1 cm infrarenal abdominal aortic ?aneurysm ? ?  ?01/27/2016 Imaging  ? [place] chest showed no acute cardiopulmonary disease. Known stable retrocrural and periaortic adenopathy. Oval density over the left neck base measuring 2.5 x 4.5 cm likely due partially to prominent internal jugular vein, although cannot exclude adenopathy in this region. Recommend neck CT with contrast. Three vessel atherosclerotic coronary artery disease. Few small thyroid nodules with the largest measuring 1.2 cm over the left lobe. 2.4 cm left renal cyst. Sub cm right renal hypodensity unchanged and too small to characterize but likely a cyst. Minimal cholelithiasis. ? ?  ?01/31/2016 Imaging  ? ECHO showed: Left ventricle: The cavity size was normal. Systolic function was normal. The estimated ejection fraction was in the range of 60% to 65%. Wall motion was normal; there were no regional wall motion abnormalities. There was an increased relative contribution of atrial contraction to ventricular filling. Doppler parameters are consistent with abnormal left ventricular relaxation (grade 1 diastolic dysfunction). ? ?  ?02/07/2016 Procedure  ? He  had imaging guided biopsy of supraclavicular lymph node ? ?  ?02/07/2016 Pathology Results  ? Accession: [id number] Core biopsies reveal diffuse areas of large atypical lymphoid cells. There are multiple cells with very large irregular nuclei or multinucleated cells. Immunohistochemistry reveals the cells are positive for CD20, CD45, bcl-2, and bcl-6. They are negative for CD10, CD30, and ALK. CD3, CD4, CD5 and CD8 reveal scattered T-cells. Ki-67 reveals an elevated proliferation rate (95%). The cells are negative for cytokeratin 7, cytokeratin 20, TTF-1, CDX-2, HMB45, SOX11, and MelanA. 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 ?  ?02/12/2016 - 02/21/2016 Hospital Admission  ? He was admitted to the hospital with sepsis. He received cycle 1 of chemotherapy will hospitalized ? ?  ?02/14/2016 Imaging  ? Ct abdomen and pelvis showed widespread retrocrural, retroperitoneal, and bilateral pelvic lymphadenopathy, waxing/waning when compared to recent CT, as above. Mild right hydronephrosis, likely secondary to extrinsic compression by right retroperitoneal lymphadenopathy, with associated diminished enhancement of the right kidney. Spleen is normal in size.  ? ?  ?02/16/2016 Bone Marrow Biopsy  ? Bone marrow biopsy was negative for lymphoma involvement. Cytogenetics came back abnormal: -Y in 45% of cell lines ? ?  ?02/16/2016 Procedure  ? He had port placement ? ?  ?02/16/2016 - 02/21/2016 Chemotherapy  ? He received cycle 1 of R-EPOCH ? ? ?  ?03/08/2016 - 03/12/2016 Hospital Admission  ? The patient received cycle 2 of chemotherapy  in the hospital ? ?  ?03/27/2016 PET scan  ? Significant decrease in size of abdominal and pelvic lymphadenopathy compared to previous CT, which shows low-grade metabolic activity. No evidence of active lymphoma within the neck or chest. 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 Continued annual follow-up by CT is recommended.  ? ?  ?04/03/2016 -  04/07/2016 Hospital Admission  ? The patient received cycle 3 of chemotherapy in the hospital ? ?  ?04/20/2016 Procedure  ? He underwent successful intrathecal injection of chemotherapy without complication ? ?  ?3/94/3

## 2021-08-04 NOTE — Assessment & Plan Note (Signed)
The patient has significant cardiovascular risk factors ?He is aware about association between diabetes and cancer ?He is actively trying to modify his diet and lifestyle and has lost some weight ?I recommend the patient to continue his best effort ?

## 2021-08-15 DIAGNOSIS — Z0100 Encounter for examination of eyes and vision without abnormal findings: Secondary | ICD-10-CM | POA: Diagnosis not present

## 2021-09-06 DIAGNOSIS — C8510 Unspecified B-cell lymphoma, unspecified site: Secondary | ICD-10-CM | POA: Diagnosis not present

## 2021-09-06 DIAGNOSIS — E1151 Type 2 diabetes mellitus with diabetic peripheral angiopathy without gangrene: Secondary | ICD-10-CM | POA: Diagnosis not present

## 2021-09-06 DIAGNOSIS — I723 Aneurysm of iliac artery: Secondary | ICD-10-CM | POA: Diagnosis not present

## 2021-09-06 DIAGNOSIS — I700 Atherosclerosis of aorta: Secondary | ICD-10-CM | POA: Diagnosis not present

## 2021-09-06 DIAGNOSIS — E7800 Pure hypercholesterolemia, unspecified: Secondary | ICD-10-CM | POA: Diagnosis not present

## 2021-09-06 DIAGNOSIS — I739 Peripheral vascular disease, unspecified: Secondary | ICD-10-CM | POA: Diagnosis not present

## 2021-09-06 DIAGNOSIS — I10 Essential (primary) hypertension: Secondary | ICD-10-CM | POA: Diagnosis not present

## 2021-09-06 DIAGNOSIS — N400 Enlarged prostate without lower urinary tract symptoms: Secondary | ICD-10-CM | POA: Diagnosis not present

## 2021-09-06 DIAGNOSIS — J449 Chronic obstructive pulmonary disease, unspecified: Secondary | ICD-10-CM | POA: Diagnosis not present

## 2021-11-03 DIAGNOSIS — M25512 Pain in left shoulder: Secondary | ICD-10-CM | POA: Diagnosis not present

## 2021-12-14 NOTE — Patient Instructions (Signed)
DUE TO COVID-19 ONLY TWO VISITORS  (aged 77 and older)  ARE ALLOWED TO COME WITH YOU AND STAY IN THE WAITING ROOM ONLY DURING PRE OP AND PROCEDURE.   **NO VISITORS ARE ALLOWED IN THE SHORT STAY AREA OR RECOVERY ROOM!!**  IF YOU WILL BE ADMITTED INTO THE HOSPITAL YOU ARE ALLOWED ONLY FOUR SUPPORT PEOPLE DURING VISITATION HOURS ONLY (7 AM -8PM)   The support person(s) must pass our screening, gel in and out, and wear a mask at all times, including in the patient's room. Patients must also wear a mask when staff or their support person are in the room. Visitors GUEST BADGE MUST BE WORN VISIBLY  One adult visitor may remain with you overnight and MUST be in the room by 8 P.M.     Your procedure is scheduled on: 12/28/21   Report to [facility] Main Entrance    Report to admitting at: 5:15 AM   Call this number if you have problems the morning of surgery [phone]   Do not eat food :After Midnight.   After Midnight you may have the following liquids until: 4:30 AM DAY OF SURGERY  Water Black Coffee (sugar ok, NO MILK/CREAM OR CREAMERS)  Tea (sugar ok, NO MILK/CREAM OR CREAMERS) regular and decaf                             Plain Jell-O (NO RED)                                           Fruit ices (not with fruit pulp, NO RED)                                     Popsicles (NO RED)                                                                  Juice: apple, WHITE grape, WHITE cranberry Sports drinks like Gatorade (NO RED)              Drink G2 drink AT: 4:30 AM the day of surgery.       The day of surgery:  Drink ONE (1) Pre-Surgery Clear Ensure or G2 at AM the morning of surgery. Drink in one sitting. Do not sip.  This drink was given to you during your hospital  pre-op appointment visit. Nothing else to drink after completing the  Pre-Surgery Clear Ensure or G2.          If you have questions, please contact your surgeon's office.    Oral Hygiene is also important  to reduce your risk of infection.                                    Remember - BRUSH YOUR TEETH THE MORNING OF SURGERY WITH YOUR REGULAR TOOTHPASTE   Do NOT smoke after Midnight   Take these medicines the morning of surgery with A SIP  OF WATER: amlodipine.Inhalers as usual.  How to Manage Your Diabetes Before and After Surgery  Why is it important to control my blood sugar before and after surgery? Improving blood sugar levels before and after surgery helps healing and can limit problems. A way of improving blood sugar control is eating a healthy diet by:  Eating less sugar and carbohydrates  Increasing activity/exercise  Talking with your doctor about reaching your blood sugar goals High blood sugars (greater than 180 mg/dL) can raise your risk of infections and slow your recovery, so you will need to focus on controlling your diabetes during the weeks before surgery. Make sure that the doctor who takes care of your diabetes knows about your planned surgery including the date and location.  How do I manage my blood sugar before surgery? Check your blood sugar at least 4 times a day, starting 2 days before surgery, to make sure that the level is not too high or low. Check your blood sugar the morning of your surgery when you wake up and every 2 hours until you get to the Short Stay unit. If your blood sugar is less than 70 mg/dL, you will need to treat for low blood sugar: Do not take insulin. Treat a low blood sugar (less than 70 mg/dL) with  cup of clear juice (cranberry or apple), 4 glucose tablets, OR glucose gel. Recheck blood sugar in 15 minutes after treatment (to make sure it is greater than 70 mg/dL). If your blood sugar is not greater than 70 mg/dL on recheck, call [phone] for further instructions. Report your blood sugar to the short stay nurse when you get to Short Stay.  If you are admitted to the hospital after surgery: Your blood sugar will be checked by the staff  and you will probably be given insulin after surgery (instead of oral diabetes medicines) to make sure you have good blood sugar levels. The goal for blood sugar control after surgery is 80-180 mg/dL.   WHAT DO I DO ABOUT MY DIABETES MEDICATION?  Do not take oral diabetes medicines (pills) the morning of surgery.  THE DAY BEFORE SURGERY, take metformin as usual.      THE MORNING OF SURGERY, DO NOT TAKE ANY ORAL DIABETIC MEDICATIONS DAY OF YOUR SURGERY  Bring CPAP mask and tubing day of surgery.                              You may not have any metal on your body including hair pins, jewelry, and body piercing             Do not wear lotions, powders, perfumes/cologne, or deodorant              Men may shave face and neck.   Do not bring valuables to the hospital. [place].   Contacts, dentures or bridgework may not be worn into surgery.   Bring small overnight bag day of surgery.   DO NOT [place]. PHARMACY WILL DISPENSE MEDICATIONS LISTED ON YOUR MEDICATION LIST TO YOU DURING YOUR ADMISSION [place]!    Patients discharged on the day of surgery will not be allowed to drive home.  Someone NEEDS to stay with you for the first 24 hours after anesthesia.   Special Instructions: Bring a  copy of your healthcare power of attorney and living will documents         the day of surgery if you haven't scanned them before.              Please read over the following fact sheets you were given: IF YOU HAVE QUESTIONS ABOUT YOUR PRE-OP INSTRUCTIONS PLEASE CALL [phone]     [facility] - Preparing for Surgery Before surgery, you can play an important role.  Because skin is not sterile, your skin needs to be as free of germs as possible.  You can reduce the number of germs on your skin by washing with CHG (chlorahexidine gluconate) soap before surgery.  CHG is an antiseptic cleaner which kills germs  and bonds with the skin to continue killing germs even after washing. Please DO NOT use if you have an allergy to CHG or antibacterial soaps.  If your skin becomes reddened/irritated stop using the CHG and inform your nurse when you arrive at Short Stay. Do not shave (including legs and underarms) for at least 48 hours prior to the first CHG shower.  You may shave your face/neck. Please follow these instructions carefully:  1.  Shower with CHG Soap the night before surgery and the  morning of Surgery.  2.  If you choose to wash your hair, wash your hair first as usual with your  normal  shampoo.  3.  After you shampoo, rinse your hair and body thoroughly to remove the  shampoo.                           4.  Use CHG as you would any other liquid soap.  You can apply chg directly  to the skin and wash                       Gently with a scrungie or clean washcloth.  5.  Apply the CHG Soap to your body ONLY FROM THE NECK DOWN.   Do not use on face/ open                           Wound or open sores. Avoid contact with eyes, ears mouth and genitals (private parts).                       Wash face,  Genitals (private parts) with your normal soap.             6.  Wash thoroughly, paying special attention to the area where your surgery  will be performed.  7.  Thoroughly rinse your body with warm water from the neck down.  8.  DO NOT shower/wash with your normal soap after using and rinsing off  the CHG Soap.                9.  Pat yourself dry with a clean towel.            10.  Wear clean pajamas.            11.  Place clean sheets on your bed the night of your first shower and do not  sleep with pets. Day of Surgery : Do not apply any lotions/deodorants the morning of surgery.  Please wear clean clothes to the hospital/surgery center.  FAILURE TO FOLLOW THESE INSTRUCTIONS MAY RESULT IN THE CANCELLATION OF  YOUR SURGERY PATIENT SIGNATURE_________________________________  NURSE  SIGNATURE__________________________________  ________________________________________________________________________ [facility]- Preparing for Total Shoulder Arthroplasty    Before surgery, you can play an important role. Because skin is not sterile, your skin needs to be as free of germs as possible. You can reduce the number of germs on your skin by using the following products. Benzoyl Peroxide Gel Reduces the number of germs present on the skin Applied twice a day to shoulder area starting two days before surgery    ==================================================================  Please follow these instructions carefully:  BENZOYL PEROXIDE 5% GEL  Please do not use if you have an allergy to benzoyl peroxide.   If your skin becomes reddened/irritated stop using the benzoyl peroxide.  Starting two days before surgery, apply as follows: Apply benzoyl peroxide in the morning and at night. Apply after taking a shower. If you are not taking a shower clean entire shoulder front, back, and side along with the armpit with a clean wet washcloth.  Place a quarter-sized dollop on your shoulder and rub in thoroughly, making sure to cover the front, back, and side of your shoulder, along with the armpit.   2 days before ____ AM   ____ PM              1 day before ____ AM   ____ PM                         Do this twice a day for two days.  (Last application is the night before surgery, AFTER using the CHG soap as described below).  Do NOT apply benzoyl peroxide gel on the day of surgery.   Incentive Spirometer  An incentive spirometer is a tool that can help keep your lungs clear and active. This tool measures how well you are filling your lungs with each breath. Taking long deep breaths may help reverse or decrease the chance of developing breathing (pulmonary) problems (especially infection) following: A long period of time when you are unable to move or be active. BEFORE THE PROCEDURE   If the spirometer includes an indicator to show your best effort, your nurse or respiratory therapist will set it to a desired goal. If possible, sit up straight or lean slightly forward. Try not to slouch. Hold the incentive spirometer in an upright position. INSTRUCTIONS FOR USE  Sit on the edge of your bed if possible, or sit up as far as you can in bed or on a chair. Hold the incentive spirometer in an upright position. Breathe out normally. Place the mouthpiece in your mouth and seal your lips tightly around it. Breathe in slowly and as deeply as possible, raising the piston or the ball toward the top of the column. Hold your breath for 3-5 seconds or for as long as possible. Allow the piston or ball to fall to the bottom of the column. Remove the mouthpiece from your mouth and breathe out normally. Rest for a few seconds and repeat Steps 1 through 7 at least 10 times every 1-2 hours when you are awake. Take your time and take a few normal breaths between deep breaths. The spirometer may include an indicator to show your best effort. Use the indicator as a goal to work toward during each repetition. After each set of 10 deep breaths, practice coughing to be sure your lungs are clear. If you have an incision (the cut made at the time of surgery), support your incision  when coughing by placing a pillow or rolled up towels firmly against it. Once you are able to get out of bed, walk around indoors and cough well. You may stop using the incentive spirometer when instructed by your caregiver.  RISKS AND COMPLICATIONS Take your time so you do not get dizzy or light-headed. If you are in pain, you may need to take or ask for pain medication before doing incentive spirometry. It is harder to take a deep breath if you are having pain. AFTER USE Rest and breathe slowly and easily. It can be helpful to keep track of a log of your progress. Your caregiver can provide you with a simple table to help  with this. If you are using the spirometer at home, follow these instructions: [place] IF:  You are having difficultly using the spirometer. You have trouble using the spirometer as often as instructed. Your pain medication is not giving enough relief while using the spirometer. You develop fever of 100.5 F (38.1 C) or higher. SEEK IMMEDIATE MEDICAL CARE IF:  You cough up bloody sputum that had not been present before. You develop fever of 102 F (38.9 C) or greater. You develop worsening pain at or near the incision site. MAKE SURE YOU:  Understand these instructions. Will watch your condition. Will get help right away if you are not doing well or get worse. Document Released: 07/30/2006 Document Revised: 06/11/2011 Document Reviewed: 09/30/2006 [facility] Patient Information 2014 [place], [place].   ________________________________________________________________________

## 2021-12-15 ENCOUNTER — Encounter (HOSPITAL_COMMUNITY): Payer: Self-pay

## 2021-12-15 ENCOUNTER — Other Ambulatory Visit: Payer: Self-pay

## 2021-12-15 ENCOUNTER — Encounter (HOSPITAL_COMMUNITY)
Admission: RE | Admit: 2021-12-15 | Discharge: 2021-12-15 | Disposition: A | Discharge status: Home / Self Care | Payer: Medicare HMO | Source: Ambulatory Visit | Attending: Orthopedic Surgery | Admitting: Orthopedic Surgery

## 2021-12-15 VITALS — BP 142/70 | HR 67 | Temp 97.8°F | Ht 71.0 in | Wt 222.0 lb

## 2021-12-15 DIAGNOSIS — E118 Type 2 diabetes mellitus with unspecified complications: Secondary | ICD-10-CM | POA: Insufficient documentation

## 2021-12-15 DIAGNOSIS — Z01818 Encounter for other preprocedural examination: Secondary | ICD-10-CM | POA: Diagnosis not present

## 2021-12-15 DIAGNOSIS — I10 Essential (primary) hypertension: Secondary | ICD-10-CM | POA: Diagnosis not present

## 2021-12-15 LAB — CBC
HCT: 46.5 % (ref 39.0–52.0)
Hemoglobin: 15.4 g/dL (ref 13.0–17.0)
MCH: 30.6 pg (ref 26.0–34.0)
MCHC: 33.1 g/dL (ref 30.0–36.0)
MCV: 92.4 fL (ref 80.0–100.0)
Platelets: 187 10*3/uL (ref 150–400)
RBC: 5.03 MIL/uL (ref 4.22–5.81)
RDW: 13.5 % (ref 11.5–15.5)
WBC: 7.6 10*3/uL (ref 4.0–10.5)
nRBC: 0 % (ref 0.0–0.2)

## 2021-12-15 LAB — BASIC METABOLIC PANEL
Anion gap: 7 (ref 5–15)
BUN: 20 mg/dL (ref 8–23)
CO2: 29 mmol/L (ref 22–32)
Calcium: 9.4 mg/dL (ref 8.9–10.3)
Chloride: 103 mmol/L (ref 98–111)
Creatinine, Ser: 0.9 mg/dL (ref 0.61–1.24)
GFR, Estimated: >60 mL/min (ref >60)
Glucose, Bld: 149 mg/dL — ABNORMAL HIGH (ref 70–99)
Potassium: 4.6 mmol/L (ref 3.5–5.1)
Sodium: 139 mmol/L (ref 135–145)

## 2021-12-15 LAB — GLUCOSE, CAPILLARY: Glucose-Capillary: 155 mg/dL — ABNORMAL HIGH (ref 70–99)

## 2021-12-15 LAB — HEMOGLOBIN A1C
Hgb A1c MFr Bld: 6.6 % — ABNORMAL HIGH (ref 4.8–5.6)
Mean Plasma Glucose: 142.72 mg/dL

## 2021-12-15 LAB — SURGICAL PCR SCREEN
MRSA, PCR: NEGATIVE
Staphylococcus aureus: NEGATIVE

## 2021-12-15 NOTE — Progress Notes (Addendum)
For Short Stay: [place] appointment date: Date of COVID positive in last 23 days:  Bowel Prep reminder:   For Anesthesia: PCP - Dr. Rennie Natter. : Clearance: 11/06/21: Chart. Cardiologist -   Chest x-ray - 03/16/21 EKG -  Stress Test -  ECHO - 01/31/16 Cardiac Cath -  Pacemaker/ICD device last checked: Pacemaker orders received: Device Rep notified:  Spinal Cord Stimulator:  Sleep Study -  CPAP -   Fasting Blood Sugar - N/A Checks Blood Sugar __0___ times a day Date and result of last Hgb A1c-  Blood Thinner Instructions: Plavix: can be hold 7 days before surgery.No instructions yet,pt. Was advised to call MD. For instructions. Aspirin Instructions: Last Dose:  Activity level: Can go up a flight of stairs and activities of daily living without stopping and without chest pain and/or shortness of breath   Able to exercise without chest pain and/or shortness of breath   Unable to go up a flight of stairs without chest pain and/or shortness of breath     Anesthesia review: Hx: HTN,DIA 2, Stroke.  Patient denies shortness of breath, fever, cough and chest pain at PAT appointment   Patient verbalized understanding of instructions that were given to them at the PAT appointment. Patient was also instructed that they will need to review over the PAT instructions again at home before surgery.

## 2021-12-18 NOTE — Progress Notes (Addendum)
Anesthesia Chart Review   Case: [id number] Date/Time: 12/28/21 0715   Procedure: REVERSE SHOULDER ARTHROPLASTY (Left: Shoulder) - 136mn   Anesthesia type: General   Pre-op diagnosis: Left shoulder rotator cuff tear arthropathy   [name]   Surgeons: SJustice Britain MD       DISCUSSION:76 y.o. former smoker with h/o HTN, DM II, PVD, iliac artery aneurysm, s/p right carotid endarterectomy, Stroke 2013, Large B cell lymphoma, left shoulder rotator cuff tear scheduled for above procedure 12/28/2021 with Dr. KJustice Britain   Pt advised to hold Plavix 5 days prior to procedure.   Pt last seen by vascular surgeon 05/10/21.  Per OV note pt due for repeat carotid duplex in June of this year.  Vascular surgeon input requested, will need repeat carotid duplex.   Addendum 12/22/2021:  Pt seen by vascular 12/21/2021. Stable at this visit.   Carotis [place]/21/2023 with 1-39% stenosis bilaterally.  VS: BP (!) 142/70   Pulse 67   Temp 36.6 C (Oral)   Ht '5\' 11"'$  (1.803 m)   Wt 100.7 kg   SpO2 93%   BMI 30.96 kg/m   PROVIDERS: HWenda Low MD is PCP   DDeitra Mayo MD is Vascular Surgeon LABS: Labs reviewed: Acceptable for surgery. (all labs ordered are listed, but only abnormal results are displayed)  Labs Reviewed  HEMOGLOBIN A1C - Abnormal; Notable for the following components:      Result Value   Hgb A1c MFr Bld 6.6 (*)    All other components within normal limits  BASIC METABOLIC PANEL - Abnormal; Notable for the following components:   Glucose, Bld 149 (*)    All other components within normal limits  GLUCOSE, CAPILLARY - Abnormal; Notable for the following components:   Glucose-Capillary 155 (*)    All other components within normal limits  SURGICAL PCR SCREEN  CBC     IMAGES: VAS [place] 12/21/2021 Summary:  Right Carotid: Velocities in the right ICA are consistent with a 1-39%  stenosis.   Left Carotid: Velocities in the left ICA are  consistent with a 1-39%  stenosis.   Vertebrals:  Bilateral vertebral arteries demonstrate antegrade flow, left               atypical.  Subclavians: Normal flow hemodynamics were seen in bilateral subclavian               arteries.   VAS [place] Duplex 12/21/2021 Summary:  Abdominal Aorta: No evidence of an abdominal aortic aneurysm was  visualized (based on limited visualization).  There is evidence of abnormal dilation of the Right Common Iliac artery.   EKG:   CV: Echo 01/31/2016 - Left ventricle: The cavity size was normal. Systolic function was    normal. The estimated ejection fraction was in the range of 60%    to 65%. Wall motion was normal; there were no regional wall    motion abnormalities. There was an increased relative    contribution of atrial contraction to ventricular filling.    Doppler parameters are consistent with abnormal left ventricular    relaxation (grade 1 diastolic dysfunction).  - Aortic valve: Trileaflet; mildly thickened, mildly calcified    leaflets.  - Tricuspid valve: There was trivial regurgitation Past Medical History:  Diagnosis Date   Aneurysm artery, iliac (HCC)    Carotid artery occlusion    Dyslipidemia    GERD (gastroesophageal reflux disease)    Hepatitis    ? age  15 or 16   Hyperlipidemia    Hypertension    Iliac artery aneurysm, right ([place])    large b cell lymphoma dx'd 02/2016   Stroke ([place])    01-05-2012   02-2012   Type II or unspecified type diabetes mellitus without mention of complication, not stated as uncontrolled     Past Surgical History:  Procedure Laterality Date   basket procedure     CAROTID ENDARTERECTOMY     ENDARTERECTOMY  02/08/2012   Procedure: ENDARTERECTOMY CAROTID;  Surgeon: Mal Misty, MD;  Location: [place];  Service: Vascular;  Laterality: Right;   IR GENERIC HISTORICAL  02/07/2016   IR US GUIDE BX ASP/DRAIN 02/07/2016 Greggory Keen, MD [name]   IR GENERIC HISTORICAL  02/16/2016   IR  FLUORO GUIDE PORT INSERTION RIGHT 02/16/2016 Aletta Edouard, MD [name]   IR GENERIC HISTORICAL  02/16/2016   IR US GUIDE VASC ACCESS RIGHT 02/16/2016 Aletta Edouard, MD [name]   IR REMOVAL TUN ACCESS W/ PORT W/O FL MOD SED  09/16/2018   KIDNEY STONE SURGERY     removal   PATCH ANGIOPLASTY  02/08/2012   Procedure: PATCH ANGIOPLASTY;  Surgeon: Mal Misty, MD;  Location: [place];  Service: Vascular;  Laterality: Right;  with dacron patch angioplasty   TEE WITHOUT CARDIOVERSION  01/08/2012   Procedure: TRANSESOPHAGEAL ECHOCARDIOGRAM (TEE);  Surgeon: Lelon Perla, MD;  Location: [facility]OS[facility];  Service: Cardiovascular;  Laterality: N/A;   TONSILLECTOMY      MEDICATIONS:  amLODipine (NORVASC) 10 MG tablet   atorvastatin (LIPITOR) 40 MG tablet   Cholecalciferol (VITAMIN D3) 25 MCG TABS   clopidogrel (PLAVIX) 75 MG tablet   lisinopril (PRINIVIL,ZESTRIL) 5 MG tablet   MAGNESIUM OXIDE PO   metFORMIN (GLUCOPHAGE-XR) 500 MG 24 hr tablet   TRELEGY ELLIPTA 100-62.5-25 MCG/ACT AEPB   No current facility-administered medications for this encounter.    Konrad Felix Ward, PA-C [facility]-Surgical Testing [phone]

## 2021-12-20 ENCOUNTER — Other Ambulatory Visit: Payer: Self-pay | Admitting: *Deleted

## 2021-12-20 DIAGNOSIS — I6523 Occlusion and stenosis of bilateral carotid arteries: Secondary | ICD-10-CM

## 2021-12-20 DIAGNOSIS — I723 Aneurysm of iliac artery: Secondary | ICD-10-CM

## 2021-12-20 NOTE — Progress Notes (Unsigned)
Office Note   History of Present Illness   Samuel Berger is a 76 y.o. (July 29, 1945) male who is here for follow up of carotid artery stenosis and right common iliac artery aneurysm.  The patient has a history of right carotid endarterectomy in 2013.  He has a known right common iliac artery aneurysm that last measured 2.7cm on CT scan.  He was last seen in our office on 09/28/2020, and at that time he was doing well and asymptomatic.  The only issue at that time was that he recently tore his left rotator cuff.  Previous carotid studies done on 09/28/2020 showed: RICA stenosis 3-87% and LICA stenosis 5-64%.   Today at follow-up he denies experiencing any TIA or strokelike symptoms.  He denies amaurosis fugax or monocular blindness. He denies facial drooping or hemiplegia. He denies receptive or expressive aphasia. He also has not been experiencing any abdominal or back pain, claudication, rest pain, or wounds of the lower extremities.  Current Outpatient Medications  Medication Sig Dispense Refill   amLODipine (NORVASC) 10 MG tablet Take 10 mg by mouth every morning.      atorvastatin (LIPITOR) 40 MG tablet Take 40 mg by mouth at bedtime.     Cholecalciferol (VITAMIN D3) 25 MCG TABS Take 1,000 mcg by mouth daily.     clopidogrel (PLAVIX) 75 MG tablet Take 75 mg by mouth daily.     lisinopril (PRINIVIL,ZESTRIL) 5 MG tablet Take 5 mg by mouth daily.     MAGNESIUM OXIDE PO Take 200 mg by mouth 2 (two) times daily.     metFORMIN (GLUCOPHAGE-XR) 500 MG 24 hr tablet Take 500 mg by mouth 2 (two) times daily with a meal.     TRELEGY ELLIPTA 100-62.5-25 MCG/ACT AEPB Inhale 1 puff into the lungs daily.     No current facility-administered medications for this visit.    ***REVIEW OF SYSTEMS (negative unless checked):   Cardiac:  '[]'$  Chest pain or chest pressure? '[]'$  Shortness of breath upon activity? '[]'$  Shortness of breath when lying flat? '[]'$  Irregular heart rhythm?  Vascular:  '[]'$  Pain in  calf, thigh, or hip brought on by walking? '[]'$  Pain in feet at night that wakes you up from your sleep? '[]'$  Blood clot in your veins? '[]'$  Leg swelling?  Pulmonary:  '[]'$  Oxygen at home? '[]'$  Productive cough? '[]'$  Wheezing?  Neurologic:  '[]'$  Sudden weakness in arms or legs? '[]'$  Sudden numbness in arms or legs? '[]'$  Sudden onset of difficult speaking or slurred speech? '[]'$  Temporary loss of vision in one eye? '[]'$  Problems with dizziness?  Gastrointestinal:  '[]'$  Blood in stool? '[]'$  Vomited blood?  Genitourinary:  '[]'$  Burning when urinating? '[]'$  Blood in urine?  Psychiatric:  '[]'$  Major depression  Hematologic:  '[]'$  Bleeding problems? '[]'$  Problems with blood clotting?  Dermatologic:  '[]'$  Rashes or ulcers?  Constitutional:  '[]'$  Fever or chills?  Ear/Nose/Throat:  '[]'$  Change in hearing? '[]'$  Nose bleeds? '[]'$  Sore throat?  Musculoskeletal:  '[]'$  Back pain? '[]'$  Joint pain? '[]'$  Muscle pain?   Physical Examination  ***There were no vitals filed for this visit. ***There is no height or weight on file to calculate BMI.  General:  WDWN in NAD; vital signs documented above Gait: Not observed HENT: WNL, normocephalic Pulmonary: normal non-labored breathing , without Rales, rhonchi,  wheezing Cardiac: regular HR, without murmurs without carotid bruit Abdomen: soft, NT, no masses Skin: without rashes Vascular Exam/Pulses: *** Extremities: without ischemic changes, without Gangrene , without cellulitis;  without open wounds;  Musculoskeletal: no muscle wasting or atrophy  Neurologic: A&O X 3;  No focal weakness or paresthesias are detected Psychiatric:  The pt has Normal affect.  Non-Invasive Vascular Imaging   Bilateral Carotid Duplex (12/21/2021):  R ICA stenosis:  {Stenosis:19197::"Occluded","80-99%","60-79%","40-59%","1-39%"} R VA: *** patent and antegrade L ICA stenosis:  {Stenosis:19197::"Occluded","80-99%","60-79%","40-59%","1-39%"} L VA: *** patent and antegrade   AAA Duplex  (12/21/2021):    Medical Decision Making   Samuel Berger is a 76 y.o. male who presents with: asymptomatic bilateral ICA stenosis 1-39%.  Based on the patient's vascular studies and examination, I have offered the patient: ***. Continue plavix and statin   Vicente Serene PA-C [facility] Office: [facility] MD: ***

## 2021-12-21 ENCOUNTER — Ambulatory Visit: Payer: Medicare HMO | Admitting: Physician Assistant

## 2021-12-21 ENCOUNTER — Ambulatory Visit (HOSPITAL_COMMUNITY)
Admission: RE | Admit: 2021-12-21 | Discharge: 2021-12-21 | Disposition: A | Discharge status: Home / Self Care | Payer: Medicare HMO | Source: Ambulatory Visit | Attending: Vascular Surgery | Admitting: Vascular Surgery

## 2021-12-21 ENCOUNTER — Ambulatory Visit (INDEPENDENT_AMBULATORY_CARE_PROVIDER_SITE_OTHER)
Admission: RE | Admit: 2021-12-21 | Discharge: 2021-12-21 | Disposition: A | Discharge status: Home / Self Care | Payer: Medicare HMO | Source: Ambulatory Visit | Attending: Vascular Surgery | Admitting: Vascular Surgery

## 2021-12-21 VITALS — BP 135/72 | HR 66 | Temp 98.2°F | Resp 20 | Ht 71.0 in | Wt 223.4 lb

## 2021-12-21 DIAGNOSIS — I6523 Occlusion and stenosis of bilateral carotid arteries: Secondary | ICD-10-CM | POA: Diagnosis not present

## 2021-12-21 DIAGNOSIS — I723 Aneurysm of iliac artery: Secondary | ICD-10-CM

## 2021-12-22 NOTE — Anesthesia Preprocedure Evaluation (Signed)
Anesthesia Evaluation  Patient identified by MRN, date of birth, ID band Patient awake    Reviewed: Allergy & Precautions, NPO status , Patient's Chart, lab work & pertinent test results, reviewed documented beta blocker date and time   Airway Mallampati: II  TM Distance: >3 FB Neck ROM: Full    Dental no notable dental hx. (+) Teeth Intact, Dental Advisory Given   Pulmonary COPD,  COPD inhaler, former smoker,    Pulmonary exam normal breath sounds clear to auscultation       Cardiovascular hypertension, Pt. 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   Anesthesia Other Findings   Reproductive/Obstetrics                           Anesthesia Physical Anesthesia Plan  ASA: 3  Anesthesia Plan: General   Post-op Pain Management: Minimal or no pain anticipated, Regional block* and Precedex   Induction: Intravenous  PONV Risk Score and Plan: 3 and Treatment may vary due to age or medical condition, Ondansetron and Dexamethasone  Airway Management Planned: Oral ETT  Additional Equipment: None  Intra-op Plan:   Post-operative Plan: Extubation in OR  Informed Consent: I have reviewed the patients History and Physical, chart, labs and discussed the procedure including the risks, benefits and alternatives for the proposed anesthesia with the patient or authorized representative who has indicated  his/her understanding and acceptance.     Dental advisory given  Plan Discussed with: CRNA and Anesthesiologist  Anesthesia Plan Comments: (See PAT note 12/15/2021)      Anesthesia Quick Evaluation

## 2021-12-27 ENCOUNTER — Other Ambulatory Visit: Payer: Self-pay

## 2021-12-27 ENCOUNTER — Encounter (HOSPITAL_COMMUNITY): Payer: Self-pay | Admitting: Orthopedic Surgery

## 2021-12-27 DIAGNOSIS — I723 Aneurysm of iliac artery: Secondary | ICD-10-CM

## 2021-12-28 ENCOUNTER — Other Ambulatory Visit: Payer: Self-pay

## 2021-12-28 ENCOUNTER — Encounter (HOSPITAL_COMMUNITY)
Admission: RE | Disposition: A | Discharge status: Home / Self Care | Payer: Self-pay | Source: Home / Self Care | Attending: Orthopedic Surgery

## 2021-12-28 ENCOUNTER — Ambulatory Visit (HOSPITAL_BASED_OUTPATIENT_CLINIC_OR_DEPARTMENT_OTHER): Payer: Medicare HMO | Admitting: Physician Assistant

## 2021-12-28 ENCOUNTER — Encounter (HOSPITAL_COMMUNITY): Payer: Self-pay | Admitting: Orthopedic Surgery

## 2021-12-28 ENCOUNTER — Ambulatory Visit (HOSPITAL_COMMUNITY): Payer: Medicare HMO | Admitting: Physician Assistant

## 2021-12-28 ENCOUNTER — Ambulatory Visit (HOSPITAL_COMMUNITY)
Admission: RE | Admit: 2021-12-28 | Discharge: 2021-12-28 | Disposition: A | Discharge status: Home / Self Care | Payer: Medicare HMO | Attending: Orthopedic Surgery | Admitting: Orthopedic Surgery

## 2021-12-28 DIAGNOSIS — Z87891 Personal history of nicotine dependence: Secondary | ICD-10-CM

## 2021-12-28 DIAGNOSIS — M19012 Primary osteoarthritis, left shoulder: Secondary | ICD-10-CM | POA: Diagnosis not present

## 2021-12-28 DIAGNOSIS — G8918 Other acute postprocedural pain: Secondary | ICD-10-CM | POA: Diagnosis not present

## 2021-12-28 DIAGNOSIS — J449 Chronic obstructive pulmonary disease, unspecified: Secondary | ICD-10-CM | POA: Diagnosis not present

## 2021-12-28 DIAGNOSIS — M12812 Other specific arthropathies, not elsewhere classified, left shoulder: Secondary | ICD-10-CM | POA: Diagnosis not present

## 2021-12-28 DIAGNOSIS — Z8572 Personal history of non-Hodgkin lymphomas: Secondary | ICD-10-CM | POA: Insufficient documentation

## 2021-12-28 DIAGNOSIS — Z9221 Personal history of antineoplastic chemotherapy: Secondary | ICD-10-CM | POA: Diagnosis not present

## 2021-12-28 DIAGNOSIS — Z7984 Long term (current) use of oral hypoglycemic drugs: Secondary | ICD-10-CM | POA: Diagnosis not present

## 2021-12-28 DIAGNOSIS — I10 Essential (primary) hypertension: Secondary | ICD-10-CM | POA: Insufficient documentation

## 2021-12-28 DIAGNOSIS — Z79899 Other long term (current) drug therapy: Secondary | ICD-10-CM | POA: Diagnosis not present

## 2021-12-28 DIAGNOSIS — Z923 Personal history of irradiation: Secondary | ICD-10-CM | POA: Insufficient documentation

## 2021-12-28 DIAGNOSIS — E1151 Type 2 diabetes mellitus with diabetic peripheral angiopathy without gangrene: Secondary | ICD-10-CM | POA: Insufficient documentation

## 2021-12-28 DIAGNOSIS — K219 Gastro-esophageal reflux disease without esophagitis: Secondary | ICD-10-CM | POA: Insufficient documentation

## 2021-12-28 DIAGNOSIS — I723 Aneurysm of iliac artery: Secondary | ICD-10-CM | POA: Insufficient documentation

## 2021-12-28 DIAGNOSIS — Z8673 Personal history of transient ischemic attack (TIA), and cerebral infarction without residual deficits: Secondary | ICD-10-CM | POA: Diagnosis not present

## 2021-12-28 DIAGNOSIS — M129 Arthropathy, unspecified: Secondary | ICD-10-CM | POA: Insufficient documentation

## 2021-12-28 DIAGNOSIS — M75102 Unspecified rotator cuff tear or rupture of left shoulder, not specified as traumatic: Secondary | ICD-10-CM

## 2021-12-28 HISTORY — PX: REVERSE SHOULDER ARTHROPLASTY: SHX5054

## 2021-12-28 LAB — GLUCOSE, CAPILLARY
Glucose-Capillary: 150 mg/dL — ABNORMAL HIGH (ref 70–99)
Glucose-Capillary: 163 mg/dL — ABNORMAL HIGH (ref 70–99)

## 2021-12-28 SURGERY — ARTHROPLASTY, SHOULDER, TOTAL, REVERSE
Anesthesia: General | Site: Shoulder | Laterality: Left

## 2021-12-28 MED ORDER — SUGAMMADEX SODIUM 200 MG/2ML IV SOLN
INTRAVENOUS | Status: DC | PRN
  Administered 2021-12-28: 200 mg via INTRAVENOUS

## 2021-12-28 MED ORDER — DEXAMETHASONE SODIUM PHOSPHATE 10 MG/ML IJ SOLN
INTRAMUSCULAR | Status: AC
  Filled 2021-12-28: qty 1

## 2021-12-28 MED ORDER — HYDROMORPHONE HCL 1 MG/ML IJ SOLN: 0.2500 mg | INTRAMUSCULAR | Status: DC | PRN

## 2021-12-28 MED ORDER — FENTANYL CITRATE (PF) 100 MCG/2ML IJ SOLN
INTRAMUSCULAR | Status: AC
  Filled 2021-12-28: qty 2

## 2021-12-28 MED ORDER — MIDAZOLAM HCL 2 MG/2ML IJ SOLN
INTRAMUSCULAR | Status: AC
  Filled 2021-12-28: qty 2

## 2021-12-28 MED ORDER — DEXAMETHASONE SODIUM PHOSPHATE 10 MG/ML IJ SOLN
INTRAMUSCULAR | Status: DC | PRN
  Administered 2021-12-28: 8 mg via INTRAVENOUS

## 2021-12-28 MED ORDER — CHLORHEXIDINE GLUCONATE 0.12 % MT SOLN
15.0000 mL | Freq: Once | OROMUCOSAL | Status: AC
  Administered 2021-12-28: 15 mL via OROMUCOSAL

## 2021-12-28 MED ORDER — LACTATED RINGERS IV BOLUS
500.0000 mL | Freq: Once | INTRAVENOUS | Status: AC
  Administered 2021-12-28: 500 mL via INTRAVENOUS

## 2021-12-28 MED ORDER — ONDANSETRON HCL 4 MG/2ML IJ SOLN
INTRAMUSCULAR | Status: AC
  Filled 2021-12-28: qty 2

## 2021-12-28 MED ORDER — TRAMADOL HCL 50 MG PO TABS: 50.0000 mg | ORAL_TABLET | Freq: Four times a day (QID) | ORAL | 0 refills | Status: DC | PRN

## 2021-12-28 MED ORDER — VANCOMYCIN HCL 1000 MG IV SOLR
INTRAVENOUS | Status: DC | PRN
  Administered 2021-12-28: 1000 mg via TOPICAL

## 2021-12-28 MED ORDER — TRANEXAMIC ACID-NACL 1000-0.7 MG/100ML-% IV SOLN
1000.0000 mg | INTRAVENOUS | Status: AC
  Administered 2021-12-28: 1000 mg via INTRAVENOUS
  Filled 2021-12-28: qty 100

## 2021-12-28 MED ORDER — LACTATED RINGERS IV BOLUS
250.0000 mL | Freq: Once | INTRAVENOUS | Status: AC
  Administered 2021-12-28: 250 mL via INTRAVENOUS

## 2021-12-28 MED ORDER — TIZANIDINE HCL 4 MG PO TABS: 4.0000 mg | ORAL_TABLET | Freq: Three times a day (TID) | ORAL | 1 refills | Status: DC | PRN

## 2021-12-28 MED ORDER — ONDANSETRON HCL 4 MG/2ML IJ SOLN: 4.0000 mg | Freq: Once | INTRAMUSCULAR | Status: DC | PRN

## 2021-12-28 MED ORDER — BUPIVACAINE LIPOSOME 1.3 % IJ SUSP
INTRAMUSCULAR | Status: DC | PRN
  Administered 2021-12-28: 10 mL via PERINEURAL

## 2021-12-28 MED ORDER — PHENYLEPHRINE HCL-NACL 20-0.9 MG/250ML-% IV SOLN
INTRAVENOUS | Status: DC | PRN
  Administered 2021-12-28: 50 ug/min via INTRAVENOUS

## 2021-12-28 MED ORDER — ONDANSETRON HCL 4 MG PO TABS: 4.0000 mg | ORAL_TABLET | Freq: Three times a day (TID) | ORAL | 0 refills | Status: DC | PRN

## 2021-12-28 MED ORDER — LIDOCAINE HCL (CARDIAC) PF 100 MG/5ML IV SOSY
PREFILLED_SYRINGE | INTRAVENOUS | Status: DC | PRN
  Administered 2021-12-28: 100 mg via INTRAVENOUS

## 2021-12-28 MED ORDER — ROCURONIUM BROMIDE 100 MG/10ML IV SOLN
INTRAVENOUS | Status: DC | PRN
  Administered 2021-12-28: 80 mg via INTRAVENOUS

## 2021-12-28 MED ORDER — OXYCODONE HCL 5 MG PO TABS: 5.0000 mg | ORAL_TABLET | Freq: Three times a day (TID) | ORAL | 0 refills | Status: DC | PRN

## 2021-12-28 MED ORDER — BUPIVACAINE HCL (PF) 0.5 % IJ SOLN
INTRAMUSCULAR | Status: DC | PRN
  Administered 2021-12-28: 20 mL via PERINEURAL

## 2021-12-28 MED ORDER — VANCOMYCIN HCL 1000 MG IV SOLR
INTRAVENOUS | Status: AC
  Filled 2021-12-28: qty 20

## 2021-12-28 MED ORDER — MIDAZOLAM HCL 5 MG/5ML IJ SOLN
INTRAMUSCULAR | Status: DC | PRN
  Administered 2021-12-28: 2 mg via INTRAVENOUS

## 2021-12-28 MED ORDER — OXYCODONE HCL 5 MG PO TABS: 5.0000 mg | ORAL_TABLET | Freq: Once | ORAL | Status: DC | PRN

## 2021-12-28 MED ORDER — ORAL CARE MOUTH RINSE: 15.0000 mL | Freq: Once | OROMUCOSAL | Status: AC

## 2021-12-28 MED ORDER — 0.9 % SODIUM CHLORIDE (POUR BTL) OPTIME
TOPICAL | Status: DC | PRN
  Administered 2021-12-28: 1000 mL

## 2021-12-28 MED ORDER — PHENYLEPHRINE HCL-NACL 20-0.9 MG/250ML-% IV SOLN
INTRAVENOUS | Status: AC
  Filled 2021-12-28: qty 250

## 2021-12-28 MED ORDER — ONDANSETRON HCL 4 MG/2ML IJ SOLN
INTRAMUSCULAR | Status: DC | PRN
  Administered 2021-12-28: 4 mg via INTRAVENOUS

## 2021-12-28 MED ORDER — OXYCODONE-ACETAMINOPHEN 5-325 MG PO TABS: 1.0000 | ORAL_TABLET | ORAL | 0 refills | Status: DC | PRN

## 2021-12-28 MED ORDER — AMISULPRIDE (ANTIEMETIC) 5 MG/2ML IV SOLN: 10.0000 mg | Freq: Once | INTRAVENOUS | Status: DC | PRN

## 2021-12-28 MED ORDER — PROPOFOL 10 MG/ML IV BOLUS
INTRAVENOUS | Status: AC
  Filled 2021-12-28: qty 20

## 2021-12-28 MED ORDER — LIDOCAINE HCL (PF) 2 % IJ SOLN
INTRAMUSCULAR | Status: AC
  Filled 2021-12-28: qty 5

## 2021-12-28 MED ORDER — ROCURONIUM BROMIDE 10 MG/ML (PF) SYRINGE
PREFILLED_SYRINGE | INTRAVENOUS | Status: AC
  Filled 2021-12-28: qty 10

## 2021-12-28 MED ORDER — CEFAZOLIN SODIUM-DEXTROSE 2-4 GM/100ML-% IV SOLN
2.0000 g | INTRAVENOUS | Status: AC
  Administered 2021-12-28: 2 g via INTRAVENOUS
  Filled 2021-12-28: qty 100

## 2021-12-28 MED ORDER — PROPOFOL 10 MG/ML IV BOLUS
INTRAVENOUS | Status: DC | PRN
  Administered 2021-12-28: 120 mg via INTRAVENOUS

## 2021-12-28 MED ORDER — LACTATED RINGERS IV SOLN: INTRAVENOUS | Status: DC

## 2021-12-28 MED ORDER — FENTANYL CITRATE (PF) 100 MCG/2ML IJ SOLN
INTRAMUSCULAR | Status: DC | PRN
  Administered 2021-12-28 (×2): 50 ug via INTRAVENOUS

## 2021-12-28 MED ORDER — OXYCODONE HCL 5 MG/5ML PO SOLN: 5.0000 mg | Freq: Once | ORAL | Status: DC | PRN

## 2021-12-28 SURGICAL SUPPLY — 74 items
ADH SKN CLS APL DERMABOND .7 (GAUZE/BANDAGES/DRESSINGS) ×1
AID PSTN UNV HD RSTRNT DISP (MISCELLANEOUS) ×1
BAG COUNTER SPONGE SURGICOUNT (BAG) IMPLANT
BAG SPEC THK2 15X12 ZIP CLS (MISCELLANEOUS) ×1
BAG SPNG CNTER NS LX DISP (BAG) ×1
BAG ZIPLOCK 12X15 (MISCELLANEOUS) ×1 IMPLANT
BLADE SAW SGTL 83.5X18.5 (BLADE) ×1 IMPLANT
BNDG CMPR 5X4 CHSV STRCH STRL (GAUZE/BANDAGES/DRESSINGS) ×1
BNDG COHESIVE 4X5 TAN STRL LF (GAUZE/BANDAGES/DRESSINGS) ×1 IMPLANT
BSPLAT GLND +2X24 MDLR (Joint) ×1 IMPLANT
COOLER ICEMAN CLASSIC (MISCELLANEOUS) ×1 IMPLANT
COVER BACK TABLE 60X90IN (DRAPES) ×1 IMPLANT
COVER SURGICAL LIGHT HANDLE (MISCELLANEOUS) ×1 IMPLANT
CUP SUT UNIV REVERS 42 NEUT (Shoulder) IMPLANT
DERMABOND ADVANCED .7 DNX12 (GAUZE/BANDAGES/DRESSINGS) ×1 IMPLANT
DRAPE INCISE IOBAN 66X45 STRL (DRAPES) IMPLANT
DRAPE ORTHO SPLIT 77X108 STRL (DRAPES) ×2
DRAPE SHEET LG 3/4 BI-LAMINATE (DRAPES) ×1 IMPLANT
DRAPE SURG 17X11 SM STRL (DRAPES) ×1 IMPLANT
DRAPE SURG ORHT 6 SPLT 77X108 (DRAPES) ×2 IMPLANT
DRAPE TOP 10253 STERILE (DRAPES) ×1 IMPLANT
DRAPE U-SHAPE 47X51 STRL (DRAPES) ×1 IMPLANT
DRESSING AQUACEL AG SP 3.5X6 (GAUZE/BANDAGES/DRESSINGS) ×1 IMPLANT
DRSG AQUACEL AG ADV 3.5X 6 (GAUZE/BANDAGES/DRESSINGS) IMPLANT
DRSG AQUACEL AG ADV 3.5X10 (GAUZE/BANDAGES/DRESSINGS) IMPLANT
DRSG AQUACEL AG SP 3.5X6 (GAUZE/BANDAGES/DRESSINGS) ×1
DRSG TEGADERM 8X12 (GAUZE/BANDAGES/DRESSINGS) ×1 IMPLANT
DURAPREP 26ML APPLICATOR (WOUND CARE) ×1 IMPLANT
ELECT BLADE TIP CTD 4 INCH (ELECTRODE) ×1 IMPLANT
ELECT PENCIL ROCKER SW 15FT (MISCELLANEOUS) ×1 IMPLANT
ELECT REM PT RETURN 15FT ADLT (MISCELLANEOUS) ×1 IMPLANT
FACESHIELD WRAPAROUND (MASK) ×4 IMPLANT
FACESHIELD WRAPAROUND OR TEAM (MASK) ×4 IMPLANT
GLENOID SYS 42 +4 LAT/24 SHLDR (Miscellaneous) IMPLANT
GLENOID UNI REV MOD 24 +2 LAT (Joint) IMPLANT
GLOVE BIO SURGEON STRL SZ7.5 (GLOVE) ×1 IMPLANT
GLOVE BIO SURGEON STRL SZ8 (GLOVE) ×1 IMPLANT
GLOVE SS BIOGEL STRL SZ 7 (GLOVE) ×1 IMPLANT
GLOVE SS BIOGEL STRL SZ 7.5 (GLOVE) ×1 IMPLANT
GOWN STRL REIN XL XLG (GOWN DISPOSABLE) ×2 IMPLANT
INSERT HUMERAL LRG 42/+6 (Miscellaneous) IMPLANT
KIT BASIN OR (CUSTOM PROCEDURE TRAY) ×1 IMPLANT
KIT TURNOVER KIT A (KITS) IMPLANT
MANIFOLD NEPTUNE II (INSTRUMENTS) ×1 IMPLANT
NDL TAPERED W/ NITINOL LOOP (MISCELLANEOUS) ×1 IMPLANT
NEEDLE TAPERED W/ NITINOL LOOP (MISCELLANEOUS) ×1 IMPLANT
NS IRRIG 1000ML POUR BTL (IV SOLUTION) ×1 IMPLANT
PACK SHOULDER (CUSTOM PROCEDURE TRAY) ×1 IMPLANT
PAD ARMBOARD 7.5X6 YLW CONV (MISCELLANEOUS) ×1 IMPLANT
PAD COLD SHLDR WRAP-ON (PAD) ×1 IMPLANT
PIN NITINOL TARGETER 2.8 (PIN) IMPLANT
PIN SET MODULAR GLENOID SYSTEM (PIN) IMPLANT
RESTRAINT HEAD UNIVERSAL NS (MISCELLANEOUS) ×1 IMPLANT
SCREW CENTRAL MOD 30MM (Screw) IMPLANT
SCREW PERI LOCK 5.5X24 (Screw) IMPLANT
SCREW PERIPHERAL 5.5X20 LOCK (Screw) IMPLANT
SLING ARM FOAM STRAP LRG (SOFTGOODS) IMPLANT
SLING ARM FOAM STRAP MED (SOFTGOODS) IMPLANT
SPONGE T-LAP 4X18 ~~LOC~~+RFID (SPONGE) ×1 IMPLANT
STEM HUMERAL UNI REV SIZE 12 (Stem) IMPLANT
SUCTION FRAZIER HANDLE 12FR (TUBING)
SUCTION TUBE FRAZIER 12FR DISP (TUBING) ×1 IMPLANT
SUT FIBERWIRE #2 38 T-5 BLUE (SUTURE)
SUT MNCRL AB 3-0 PS2 18 (SUTURE) ×1 IMPLANT
SUT MON AB 2-0 CT1 36 (SUTURE) ×1 IMPLANT
SUT VIC AB 1 CT1 36 (SUTURE) ×1 IMPLANT
SUTURE FIBERWR #2 38 T-5 BLUE (SUTURE) IMPLANT
SUTURE TAPE 1.3 40 TPR END (SUTURE) ×2 IMPLANT
SUTURETAPE 1.3 40 TPR END (SUTURE) ×2
TOWEL OR 17X26 10 PK STRL BLUE (TOWEL DISPOSABLE) ×1 IMPLANT
TOWEL OR NON WOVEN STRL DISP B (DISPOSABLE) ×1 IMPLANT
TUBE SUCTION HIGH CAP CLEAR NV (SUCTIONS) ×1 IMPLANT
WATER STERILE IRR 1000ML POUR (IV SOLUTION) ×2 IMPLANT
YANKAUER SUCT BULB TIP 10FT TU (MISCELLANEOUS) IMPLANT

## 2021-12-28 NOTE — Care Plan (Signed)
Ortho Bundle Case Management Note  Patient Details  Name: Samuel Berger MRN: [id number] Date of Birth: 06/06/1945                  L Rev TSA on 12/28/21. DCP: Home with wife (who is a doctor). DME: Sling and CTU provided by hospital. PT: EO   DME Arranged:  N/A DME Agency:     HH Arranged:    [place] Agency:     Additional Comments: Please contact me with any questions of if this plan should need to change.  Marianne Sofia, RN,CCM [facility]  [phone] 12/28/2021, 10:31 AM

## 2021-12-28 NOTE — Progress Notes (Signed)
Occupational Therapy Evaluation Patient Details Name: Samuel Berger MRN: [id number] DOB: 06-11-1945 Today's Date: 12/28/2021   History of Present Illness Samuel Berger is s/p a L shoulder reverse arthroplasty on 12-28-2021, secondary to L shoulder rotator cuff arthropathy. .   Clinical Impression   Pt is s/p shoulder replacement without functional use of left non-dominant upper extremity secondary to effects of surgery and interscalene block and shoulder precautions. Therapist provided education and instruction to patient and spouse with regards to L UE ROM/exercises, shoulder precautions including weight bearing restrictions, proper L UE positioning, donning upper extremity clothing, bathing management recommendations while maintaining shoulder precautions, use of ice for edema management and how to correctly donn/doff sling. Patient and spouse verbalized understanding and demonstrated understanding as needed. Patient needed assistance to donn shirt, underwear, and pants, with instruction provided on compensatory strategies perform ADLs/dressing. Patient to follow up with MD for further therapy needs.       Recommendations for follow up therapy are one component of a multi-disciplinary discharge planning process, led by the attending physician.  Recommendations may be updated based on patient status, additional functional criteria and insurance authorization.   Follow Up Recommendations  Follow physician's recommendations for discharge plan and follow up therapies    Assistance Recommended at Discharge Intermittent Supervision/Assistance  Patient can return home with the following A little help with bathing/dressing/bathroom;Assistance with cooking/housework;Assist for transportation;Help with stairs or ramp for entrance;Direct supervision/assist for medications management    Functional Status Assessment  Patient has had a recent decline in their functional status and demonstrates the  ability to make significant improvements in function in a reasonable and predictable amount of time.  Equipment Recommendations  Tub/shower seat       Precautions / Restrictions Precautions Precautions: Shoulder Type of Shoulder Precautions: If sitting in controlled environment, ok to come out of sling to give neck a break. Please sleep in it to protect until follow up in office.     OK to use operative arm for feeding, hygiene and ADLs.   Ok to instruct Pendulums and lap slides as exercises. Ok to use operative arm within the following parameters for ADL purposes     New ROM (8/18)   Ok for PROM, AAROM, AROM within pain tolerance and within the following ROM   ER 20   ABD 45   FE 60 Required Braces or Orthoses: Sling Restrictions Weight Bearing Restrictions: Yes LUE Weight Bearing: Non weight bearing      Mobility         Transfers Overall transfer level: Needs assistance   Transfers: Sit to/from Stand Sit to Stand: Supervision           Balance     Sitting balance-Leahy Scale: Good       Standing balance-Leahy Scale: Good           ADL either performed or assessed with clinical judgement       Pertinent Vitals/Pain Pain Assessment Pain Assessment: No/denies pain     Hand Dominance Right   Extremity/Trunk Assessment Upper Extremity Assessment Upper Extremity Assessment:  (R UE AROM and strength WFL. L shoulder ROM not assessed, and distal L UE with strength and ROM limitations, due to prior nerve block)   Lower Extremity Assessment Lower Extremity Assessment: Overall WFL for tasks assessed       Communication Communication Communication: No difficulties   Cognition Arousal/Alertness: Awake/alert Behavior During Therapy: WFL for tasks assessed/performed Overall Cognitive Status: Within Functional Limits for tasks assessed  General Comments: Oriented x4, able to follow commands without difficulty           Shoulder  Instructions Shoulder Instructions Donning/doffing shirt without moving shoulder: Caregiver independent with task Method for sponge bathing under operated UE: Patient able to independently direct caregiver Donning/doffing sling/immobilizer: Caregiver independent with task Correct positioning of sling/immobilizer: Caregiver independent with task Pendulum exercises (written home exercise program): Caregiver independent with task ROM for elbow, wrist and digits of operated UE: Patient able to independently direct caregiver Sling wearing schedule (on at all times/off for ADL's): Patient able to independently direct caregiver;Caregiver independent with task Proper positioning of operated UE when showering: Caregiver independent with task Dressing change: Caregiver independent with task Positioning of UE while sleeping: Caregiver independent with task    Home Living Family/patient expects to be discharged to:: Private residence Living Arrangements: Spouse/significant other Available Help at Discharge: Family Type of Home: House Home Access: Stairs to enter Technical brewer of Steps: 2 Entrance Stairs-Rails: None Home Layout: Two level;Able to live on main level with bedroom/bathroom     Bathroom Shower/Tub: Occupational psychologist: Handicapped height     Home Equipment: None          Prior Functioning/Environment Prior Level of Function : Independent/Modified Independent               OT Problem List: Decreased strength;Decreased range of motion;Impaired UE functional use          AM-PAC OT "6 Clicks" Daily Activity     Outcome Measure Help from another person eating meals?: None Help from another person taking care of personal grooming?: None Help from another person toileting, which includes using toliet, bedpan, or urinal?: A Little Help from another person bathing (including washing, rinsing, drying)?: A Little Help from another person to put on and  taking off regular upper body clothing?: A Little Help from another person to put on and taking off regular lower body clothing?: A Little 6 Click Score: 20   End of Session Nurse Communication:  (Nurse cleared the pt for participation in the session)  Activity Tolerance: Patient tolerated treatment well Patient left: in chair;with family/visitor present  OT Visit Diagnosis: Muscle weakness (generalized) (M62.81)                Time: 1200-1240 OT Time Calculation (min): 40 min Charges:  OT General Charges $OT Visit: 1 Visit OT Evaluation $OT Eval Low Complexity: 1 Low OT Treatments $Self Care/Home Management : 8-22 mins $Therapeutic Activity: 8-22 mins    Leota Sauers, OTR/L 12/28/2021, 1:08 PM

## 2021-12-28 NOTE — Transfer of Care (Signed)
Immediate Anesthesia Transfer of Care Note  Patient: Samuel Berger  Procedure(s) Performed: REVERSE SHOULDER ARTHROPLASTY (Left: Shoulder)  Patient Location: PACU  Anesthesia Type:General  Level of Consciousness: awake  Airway & Oxygen Therapy: Patient Spontanous Breathing  Post-op Assessment: Report given to RN  Post vital signs: stable  Last Vitals:  Vitals Value Taken Time  BP 151/65 12/28/21 0935  Temp 36.3 C 12/28/21 0935  Pulse 77 12/28/21 0942  Resp 20 12/28/21 0942  SpO2 95 % 12/28/21 0942  Vitals shown include unvalidated device data.  Last Pain:  Vitals:   12/28/21 0614  TempSrc:   PainSc: 0-No pain      Patients Stated Pain Goal: 5 (16/10/96 0454)  Complications: No notable events documented.

## 2021-12-28 NOTE — Discharge Instructions (Signed)

## 2021-12-28 NOTE — Anesthesia Procedure Notes (Signed)
Anesthesia Regional Block: Interscalene brachial plexus block   Pre-Anesthetic Checklist: , timeout performed,  Correct Patient, Correct Site, Correct Laterality,  Correct Procedure, Correct Position, site marked,  Risks and benefits discussed,  Surgical consent,  Pre-op evaluation,  At surgeon's request and post-op pain management  Laterality: Left  Prep: chloraprep       Needles:  Injection technique: Single-shot  Needle Type: Echogenic Stimulator Needle     Needle Length: 10cm  Needle Gauge: 21   Needle insertion depth: 6 cm   Additional Needles:   Procedures:,,,, ultrasound used (permanent image in chart),,    Narrative:  Start time: 12/28/2021 7:12 AM End time: 12/28/2021 7:17 AM Injection made incrementally with aspirations every 5 mL.  Performed by: Personally  Anesthesiologist: Josephine Igo, MD  Additional Notes: Timeout performed. Patient sedated. Relevant anatomy ID'd using [place]. Incremental 2-36m injection of LA with frequent aspiration. Patient tolerated procedure well.     Left Interscalene Block

## 2021-12-28 NOTE — Anesthesia Procedure Notes (Signed)
Procedure Name: Intubation Date/Time: 12/28/2021 7:50 AM  Performed by: Cleave Ternes, Forest Gleason, CRNAPre-anesthesia Checklist: Patient identified, Emergency Drugs available, Suction available, Patient being monitored and Timeout performed Patient Re-evaluated:Patient Re-evaluated prior to induction Oxygen Delivery Method: Circle system utilized Preoxygenation: Pre-oxygenation with 100% oxygen Induction Type: IV induction Ventilation: Mask ventilation without difficulty Laryngoscope Size: Mac and 4 Grade View: Grade IV Tube type: Oral Tube size: 7.5 mm Number of attempts: 1 Airway Equipment and Method: Stylet Placement Confirmation: ETT inserted through vocal cords under direct vision, positive ETCO2, CO2 detector and breath sounds checked- equal and bilateral Secured at: 22 cm Tube secured with: Tape Dental Injury: Teeth and Oropharynx as per pre-operative assessment

## 2021-12-28 NOTE — Anesthesia Postprocedure Evaluation (Signed)
Anesthesia Post Note  Patient: Samuel Berger  Procedure(s) Performed: REVERSE SHOULDER ARTHROPLASTY (Left: Shoulder)     Patient location during evaluation: PACU Anesthesia Type: General Level of consciousness: awake and alert and oriented Pain management: pain level controlled Vital Signs Assessment: post-procedure vital signs reviewed and stable Respiratory status: spontaneous breathing, nonlabored ventilation and respiratory function stable Cardiovascular status: blood pressure returned to baseline and stable Postop Assessment: no apparent nausea or vomiting Anesthetic complications: no   No notable events documented.  Last Vitals:  Vitals:   12/28/21 0935 12/28/21 0945  BP: (!) 151/65 139/63  Pulse: 82 77  Resp: 13 18  Temp: (!) 36.3 C   SpO2: 97% 94%    Last Pain:  Vitals:   12/28/21 0935  TempSrc:   PainSc: 0-No pain                 Tavarus Poteete A.

## 2021-12-28 NOTE — H&P (Signed)
Samuel Berger    Chief Complaint: Left shoulder rotator cuff tear arthropathy HPI: The patient is a 76 y.o. male with chronic and progressively increasing left shoulder pain related to severe rotator cuff tear arthropathy.  Due to his increasing functional imitations and failure to respond to prolonged attempts at conservative management, he is brought to the operating room at this time for planned left shoulder reverse arthroplasty  Past Medical History:  Diagnosis Date   Aneurysm artery, iliac ([place])    Carotid artery occlusion    Dyslipidemia    GERD (gastroesophageal reflux disease)    Hepatitis    ? age 53 or 40   Hyperlipidemia    Hypertension    Iliac artery aneurysm, right ([place])    large b cell lymphoma dx'd 02/2016   Stroke ([place])    01-05-2012   02-2012   Type II or unspecified type diabetes mellitus without mention of complication, not stated as uncontrolled       Past Surgical History:  Procedure Laterality Date   basket procedure     CAROTID ENDARTERECTOMY     ENDARTERECTOMY  02/08/2012   Procedure: ENDARTERECTOMY CAROTID;  Surgeon: Mal Misty, MD;  Location: [facility];  Service: Vascular;  Laterality: Right;   IR GENERIC HISTORICAL  02/07/2016   IR US GUIDE BX ASP/DRAIN 02/07/2016 Greggory Keen, MD [name]   IR GENERIC HISTORICAL  02/16/2016   IR FLUORO GUIDE PORT INSERTION RIGHT 02/16/2016 Aletta Edouard, MD [name]   IR GENERIC HISTORICAL  02/16/2016   IR US GUIDE VASC ACCESS RIGHT 02/16/2016 Aletta Edouard, MD [name]   IR REMOVAL TUN ACCESS W/ PORT W/O FL MOD SED  09/16/2018   KIDNEY STONE SURGERY     removal   PATCH ANGIOPLASTY  02/08/2012   Procedure: PATCH ANGIOPLASTY;  Surgeon: Mal Misty, MD;  Location: [facility];  Service: Vascular;  Laterality: Right;  with dacron patch angioplasty   TEE WITHOUT CARDIOVERSION  01/08/2012   Procedure: TRANSESOPHAGEAL ECHOCARDIOGRAM (TEE);  Surgeon: Lelon Perla, MD;  [facility] ENDOSCOPY;  Service:  Cardiovascular;  Laterality: N/A;   TONSILLECTOMY      Family History  Problem Relation Age of Onset   Diabetes Mother    Lung cancer Father     Social History:  reports that he quit smoking about 9 years ago. His smoking use included cigars and cigarettes. He has a 15.00 pack-year smoking history. He has quit using smokeless tobacco. He reports current alcohol use of about 3.0 standard drinks of alcohol per week. He reports that he does not use drugs.  BMI: Estimated body mass index is 31.16 kg/m as calculated from the following:   Height as of this encounter: '5\' 11"'$  (1.803 m).   Weight as of this encounter: 101.3 kg.  Lab Results  Component Value Date   ALBUMIN 4.2 08/04/2021   Diabetes: Patient does not have a diagnosis of diabetes. Lab Results  Component Value Date   HGBA1C 6.6 (H) 12/15/2021     Smoking Status: Social History   Tobacco Use  Smoking Status Former   Packs/day: 0.50   Years: 30.00   Total pack years: 15.00   Types: Cigars, Cigarettes   Quit date: 01/05/2012   Years since quitting: 9.9  Smokeless Tobacco Former   The patient is not currently a tobacco user. Counseling given: Not Answered      Medications Prior to Admission  Medication Sig Dispense Refill   amLODipine (NORVASC) 10 MG  tablet Take 10 mg by mouth every morning.      atorvastatin (LIPITOR) 40 MG tablet Take 40 mg by mouth at bedtime.     Cholecalciferol (VITAMIN D3) 25 MCG TABS Take 1,000 mcg by mouth daily.     clopidogrel (PLAVIX) 75 MG tablet Take 75 mg by mouth daily.     lisinopril (PRINIVIL,ZESTRIL) 5 MG tablet Take 5 mg by mouth daily.     MAGNESIUM OXIDE PO Take 200 mg by mouth 2 (two) times daily.     metFORMIN (GLUCOPHAGE-XR) 500 MG 24 hr tablet Take 500 mg by mouth 2 (two) times daily with a meal.     TRELEGY ELLIPTA 100-62.5-25 MCG/ACT AEPB Inhale 1 puff into the lungs daily.       Physical Exam: Left shoulder demonstrates painful and guarded motion as noted at recent  office visits.  He has globally decreased strength to manual muscle testing.  Examination is otherwise as noted at recent office visit.  Plain radiographs confirm changes consistent with chronic rotator cuff tear arthropathy.  Vitals  Temp:  [98 F (36.7 C)] 98 F (36.7 C) (09/28 0604) Pulse Rate:  [76] 76 (09/28 0604) Resp:  [16] 16 (09/28 0604) BP: (136)/(58) 136/58 (09/28 0604) SpO2:  [95 %] 95 % (09/28 0604) Weight:  [101.3 kg] 101.3 kg (09/28 3646)  Assessment/Plan  Impression: Left shoulder rotator cuff tear arthropathy  Plan of Action: Procedure(s): REVERSE SHOULDER ARTHROPLASTY  Samuel Berger 12/28/2021, 6:47 AM Contact # [phone]

## 2021-12-28 NOTE — Op Note (Signed)
12/28/2021  9:30 AM  PATIENT:   Samuel Berger  76 y.o. male  PRE-OPERATIVE DIAGNOSIS:  Left shoulder rotator cuff tear arthropathy  POST-OPERATIVE DIAGNOSIS: Same  PROCEDURE: Left shoulder reverse arthroplasty lysing a press-fit size 12 Arthrex stem with a neutral metaphysis, +6 polyethylene insert, 42/+4 glenosphere and a small/+2 baseplate  SURGEON:  Sima Lindenberger, Metta Clines M.D.  ASSISTANTS: Jenetta Loges, PA-C  Jenetta Loges, PA-C was utilized as an Environmental consultant throughout this case, essential for help with positioning the patient, positioning extremity, tissue manipulation, implantation of the prosthesis, suture management, wound closure, and intraoperative decision-making.  ANESTHESIA:   General endotracheal and interscalene block with Exparel  EBL: 250 cc  SPECIMEN: None  Drains: None   PATIENT DISPOSITION:  PACU - hemodynamically stable.    PLAN OF CARE: Discharge to home after PACU  Brief history:  [name] is a 76 year old gentleman with chronic and progressively increasing left shoulder pain related to Samuel Berger rotator cuff tear arthropathy.  Due to his increasing functional limitations and failure to respond to prolonged attempts at conservative management, he is brought to the operating room at this time for planned left shoulder reverse arthroplasty.  Preoperatively, I counseled the patient regarding treatment options and risks versus benefits thereof.  Possible surgical complications were all reviewed including potential for bleeding, infection, neurovascular injury, persistent pain, loss of motion, anesthetic complication, failure of the implant, and possible need for additional surgery. They understand and accept and agrees with our planned procedure.   Procedure detail:  After undergoing routine preop evaluation patient received prophylactic antibiotics and interscalene block with Exparel was established in the holding area by the anesthesia department.   Patient was subsequently placed supine on the operating table and underwent the smooth induction of a general endotracheal anesthesia.  Placed into the beachchair position and appropriately padded and protected.  The left shoulder girdle region was sterilely prepped and draped in standard fashion.  Timeout was called.  A deltopectoral approach to the left shoulder was made through an approximately 10 cm incision.  Skin flaps were elevated dissection carried deeply and the deltopectoral interval was then developed from proximal to distal with the vein taken laterally.  The conjoined tendon was mobilized and retracted medially.  The long head biceps tendon was tenodesed at the upper border of the pectoralis major tendon and the proximal segment was unroofed and excised.  The rotator cuff was completed deficient superiorly.  The subscapularis was separated from the lesser tuberosity and tagged with a pair of suture tape sutures and then the capsular attachments were divided from the anterior and inferior margins of the humeral neck allowing delivery of the humeral head through the wound.  An extra medullary guide was then used to outline a proposed humeral head resection which we performed with an oscillating saw at approximately 20 degrees of retroversion.  Marginal osteophytes were removed with rongeurs and this was sized at a large and a metal cap was then placed over the cut proximal humeral surface.  At this point the glenoid was then exposed and a circumferential labral resection was completed.  A guidepin was directed into the center of the glenoid in a neutral alignment and plated was then reamed with the central followed by the peripheral reamer to a stable subchondral bony bed.  Preparation completed with a drill and tapped for a 30 mm lag screw.  Our baseplate was assembled and vancomycin powder applied to the threads of the lag screw the baseplate  was then inserted with excellent purchase and fixation.   The peripheral locking screws were all then placed using standard technique with excellent fixation.  A 42/+4 glenosphere was then impacted onto the baseplate and the central locking screw was placed.  Our attention was then returned to the proximal humerus where the canal was opened by hand reaming and we ultimately broached up to a size 12 at approximately 20 degrees of retroversion.  A neutral metaphyseal reaming guide was then used to prepare the metaphysis.  A trial implant was then placed showing excellent motion stability and soft tissue balance.  This point the trial was then removed.  Our final implant was assembled.  The canal was irrigated cleaned and dried and vancomycin powder spread into the humeral canal and the implant was then seated with excellent purchase and fixation.  A series of trial reduction was then performed and ultimately felt that the +6 poly gave is the optimal motion, stability, and soft tissue balance.  This point the trial poly was removed the final poly was then impacted on the implant after was cleaned and dried.  A final reduction was then performed which again showed excellent motion stability and soft tissue balance all much to our satisfaction.  The wound was copiously irrigated.  Final hemostasis was obtained.  The balance of the vancomycin powder was then placed liberally into the soft tissue planes.  The subscapularis was confirmed to have good elasticity and was repaired back to the eyelets on the collar the implant with the arm then easily achieving 45 degrees of external rotation without excessive tension on the subscap repair.  The deltopectoral interval was then reapproximated with a series of figure-of-eight number Vicryl sutures.  2-0 Monocryl used to close the subcu layer and intracuticular 3-0 Monocryl used to close the skin followed by Dermabond and Aquacel dressing.  Left arm was then placed into a sling and the patient was awakened, extubated, taken to the  recovery room in stable condition.  Samuel Berger   Contact # [phone]

## 2021-12-29 ENCOUNTER — Encounter (HOSPITAL_COMMUNITY): Payer: Self-pay | Admitting: Orthopedic Surgery

## 2022-01-08 DIAGNOSIS — Z471 Aftercare following joint replacement surgery: Secondary | ICD-10-CM | POA: Diagnosis not present

## 2022-01-08 DIAGNOSIS — Z96612 Presence of left artificial shoulder joint: Secondary | ICD-10-CM | POA: Diagnosis not present

## 2022-01-23 DIAGNOSIS — M25512 Pain in left shoulder: Secondary | ICD-10-CM | POA: Diagnosis not present

## 2022-01-30 DIAGNOSIS — M25512 Pain in left shoulder: Secondary | ICD-10-CM | POA: Diagnosis not present

## 2022-02-01 DIAGNOSIS — M25512 Pain in left shoulder: Secondary | ICD-10-CM | POA: Diagnosis not present

## 2022-02-06 DIAGNOSIS — M25512 Pain in left shoulder: Secondary | ICD-10-CM | POA: Diagnosis not present

## 2022-02-08 DIAGNOSIS — M25512 Pain in left shoulder: Secondary | ICD-10-CM | POA: Diagnosis not present

## 2022-02-13 DIAGNOSIS — M25512 Pain in left shoulder: Secondary | ICD-10-CM | POA: Diagnosis not present

## 2022-02-15 DIAGNOSIS — M25512 Pain in left shoulder: Secondary | ICD-10-CM | POA: Diagnosis not present

## 2022-02-20 DIAGNOSIS — M25512 Pain in left shoulder: Secondary | ICD-10-CM | POA: Diagnosis not present

## 2022-02-27 DIAGNOSIS — M25512 Pain in left shoulder: Secondary | ICD-10-CM | POA: Diagnosis not present

## 2022-03-01 DIAGNOSIS — M25512 Pain in left shoulder: Secondary | ICD-10-CM | POA: Diagnosis not present

## 2022-03-07 DIAGNOSIS — M25512 Pain in left shoulder: Secondary | ICD-10-CM | POA: Diagnosis not present

## 2022-03-15 DIAGNOSIS — M25512 Pain in left shoulder: Secondary | ICD-10-CM | POA: Diagnosis not present

## 2022-03-19 DIAGNOSIS — Z471 Aftercare following joint replacement surgery: Secondary | ICD-10-CM | POA: Diagnosis not present

## 2022-03-19 DIAGNOSIS — Z96612 Presence of left artificial shoulder joint: Secondary | ICD-10-CM | POA: Diagnosis not present

## 2022-03-19 DIAGNOSIS — M25522 Pain in left elbow: Secondary | ICD-10-CM | POA: Diagnosis not present

## 2022-03-20 DIAGNOSIS — M25512 Pain in left shoulder: Secondary | ICD-10-CM | POA: Diagnosis not present

## 2022-04-11 DIAGNOSIS — M25522 Pain in left elbow: Secondary | ICD-10-CM | POA: Diagnosis not present

## 2022-04-11 DIAGNOSIS — M7712 Lateral epicondylitis, left elbow: Secondary | ICD-10-CM | POA: Diagnosis not present

## 2022-04-17 DIAGNOSIS — I10 Essential (primary) hypertension: Secondary | ICD-10-CM | POA: Diagnosis not present

## 2022-04-17 DIAGNOSIS — J449 Chronic obstructive pulmonary disease, unspecified: Secondary | ICD-10-CM | POA: Diagnosis not present

## 2022-04-17 DIAGNOSIS — I639 Cerebral infarction, unspecified: Secondary | ICD-10-CM | POA: Diagnosis not present

## 2022-04-17 DIAGNOSIS — N400 Enlarged prostate without lower urinary tract symptoms: Secondary | ICD-10-CM | POA: Diagnosis not present

## 2022-04-17 DIAGNOSIS — E7800 Pure hypercholesterolemia, unspecified: Secondary | ICD-10-CM | POA: Diagnosis not present

## 2022-04-17 DIAGNOSIS — E1151 Type 2 diabetes mellitus with diabetic peripheral angiopathy without gangrene: Secondary | ICD-10-CM | POA: Diagnosis not present

## 2022-04-18 ENCOUNTER — Encounter: Payer: Self-pay | Admitting: Hematology and Oncology

## 2022-04-18 DIAGNOSIS — I723 Aneurysm of iliac artery: Secondary | ICD-10-CM | POA: Diagnosis not present

## 2022-04-18 DIAGNOSIS — I714 Abdominal aortic aneurysm, without rupture, unspecified: Secondary | ICD-10-CM | POA: Diagnosis not present

## 2022-04-18 DIAGNOSIS — Z1331 Encounter for screening for depression: Secondary | ICD-10-CM | POA: Diagnosis not present

## 2022-04-18 DIAGNOSIS — Z0000 Encounter for general adult medical examination without abnormal findings: Secondary | ICD-10-CM | POA: Diagnosis not present

## 2022-04-18 DIAGNOSIS — E1151 Type 2 diabetes mellitus with diabetic peripheral angiopathy without gangrene: Secondary | ICD-10-CM | POA: Diagnosis not present

## 2022-04-18 DIAGNOSIS — I10 Essential (primary) hypertension: Secondary | ICD-10-CM | POA: Diagnosis not present

## 2022-04-18 DIAGNOSIS — I700 Atherosclerosis of aorta: Secondary | ICD-10-CM | POA: Diagnosis not present

## 2022-04-18 DIAGNOSIS — C8510 Unspecified B-cell lymphoma, unspecified site: Secondary | ICD-10-CM | POA: Diagnosis not present

## 2022-04-18 DIAGNOSIS — E7800 Pure hypercholesterolemia, unspecified: Secondary | ICD-10-CM | POA: Diagnosis not present

## 2022-04-18 DIAGNOSIS — J449 Chronic obstructive pulmonary disease, unspecified: Secondary | ICD-10-CM | POA: Diagnosis not present

## 2022-04-18 DIAGNOSIS — I739 Peripheral vascular disease, unspecified: Secondary | ICD-10-CM | POA: Diagnosis not present

## 2022-04-18 DIAGNOSIS — Z125 Encounter for screening for malignant neoplasm of prostate: Secondary | ICD-10-CM | POA: Diagnosis not present

## 2022-07-18 ENCOUNTER — Telehealth: Payer: Self-pay | Admitting: Hematology and Oncology

## 2022-07-18 NOTE — Telephone Encounter (Signed)
Left patient a vm regarding appointment change  

## 2022-07-19 DIAGNOSIS — H5203 Hypermetropia, bilateral: Secondary | ICD-10-CM | POA: Diagnosis not present

## 2022-08-06 ENCOUNTER — Other Ambulatory Visit: Payer: Medicare HMO

## 2022-08-06 ENCOUNTER — Ambulatory Visit: Payer: Medicare HMO | Admitting: Hematology and Oncology

## 2022-08-09 ENCOUNTER — Ambulatory Visit (HOSPITAL_COMMUNITY)
Admission: RE | Admit: 2022-08-09 | Discharge: 2022-08-09 | Disposition: A | Discharge status: Home / Self Care | Payer: Medicare HMO | Source: Ambulatory Visit | Attending: Vascular Surgery | Admitting: Vascular Surgery

## 2022-08-09 ENCOUNTER — Ambulatory Visit: Payer: Medicare HMO | Admitting: Vascular Surgery

## 2022-08-09 ENCOUNTER — Encounter: Payer: Self-pay | Admitting: Vascular Surgery

## 2022-08-09 VITALS — BP 155/74 | HR 75 | Temp 98.2°F | Resp 20 | Ht 71.0 in | Wt 220.0 lb

## 2022-08-09 DIAGNOSIS — I872 Venous insufficiency (chronic) (peripheral): Secondary | ICD-10-CM

## 2022-08-09 DIAGNOSIS — I6521 Occlusion and stenosis of right carotid artery: Secondary | ICD-10-CM

## 2022-08-09 DIAGNOSIS — I723 Aneurysm of iliac artery: Secondary | ICD-10-CM | POA: Diagnosis not present

## 2022-08-09 NOTE — Progress Notes (Signed)
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  MEDICAL ISSUES:   RIGHT COMMON ILIAC ARTERY ANEURYSM: His right common iliac artery is 3.2 cm in maximum diameter and is increased only slightly compared to 2.7 in February 2023.  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  He has extensive collaterals.  He understands we would not consider addressing his iliac aneurysm on the right less enlarge significantly.  He is not a smoker.  S/P RIGHT CAROTID ENDARTERECTOMY: His last carotid duplex scan was in September of last year.  He had no recurrent stenosis on the right and no significant left carotid stenosis.  His carotid endarterectomy was in 2013.  I think it would be safe to extend his follow-up study out until May 2025 so we can coordinate his studies.  He is asymptomatic.  He is on aspirin and is on a statin.  CHRONIC VENOUS INSUFFICIENCY: He does have chronic venous insufficiency with hyperpigmentation bilaterally.  He has been elevating his legs and wearing his compression stockings.  Previous studies showed no significant superficial venous disease.  HPI:   Samuel Berger is a pleasant 77 y.o. male who had a right carotid endarterectomy in 2013.  Most recently have been following with a right common iliac artery aneurysm.  When I last saw him over a year ago it was 2.7 cm in maximum diameter.  He comes in for a 1 year follow-up visit.  He also has a history of chronic venous insufficiency.  Since I saw him last he denies any abdominal pain or back pain.  He has a left common iliac artery occlusion but denies any claudication, rest pain, or nonhealing ulcers.  He denies any history of stroke, TIAs, expressive or receptive aphasia, or amaurosis fugax.  Past Medical History:  Diagnosis Date   Aneurysm artery, iliac (HCC)    Carotid  artery occlusion    Dyslipidemia    GERD (gastroesophageal reflux disease)    Hepatitis    ? age 40 or 60   Hyperlipidemia    Hypertension    Iliac artery aneurysm, right (HCC)    large b cell lymphoma dx'd 02/2016   Stroke (HCC)    01-05-2012   02-2012   Type II or unspecified type diabetes mellitus without mention of complication, not stated as uncontrolled     Family History  Problem Relation Age of Onset   Diabetes Mother    Lung cancer Father     SOCIAL HISTORY: Social History   Tobacco Use   Smoking status: Former    Packs/day: 0.50    Years: 30.00    Additional pack years: 0.00    Total pack years: 15.00    Types: Cigars, Cigarettes    Quit date: 01/05/2012    Years since quitting: 10.6   Smokeless tobacco: Former  Substance Use Topics   Alcohol use: Yes    Alcohol/week: 3.0 standard drinks of alcohol    Types: 1 Glasses of wine, 1 Cans of beer, 1 Shots of liquor per week    Comment: quit 2 months..Patient drinks coffee daily.OCCASIONAL DRINKER    Allergies  Allergen Reactions   Bee Venom Hives   Metformin Swelling   Sulfa Drugs Cross Reactors Hives    Current Outpatient Medications  Medication Sig Dispense Refill   amLODipine (NORVASC) 10 MG tablet Take 10 mg by  mouth every morning.      atorvastatin (LIPITOR) 40 MG tablet Take 40 mg by mouth at bedtime.     Cholecalciferol (VITAMIN D3) 25 MCG TABS Take 1,000 mcg by mouth daily.     clopidogrel (PLAVIX) 75 MG tablet Take 75 mg by mouth daily.     lisinopril (PRINIVIL,ZESTRIL) 5 MG tablet Take 5 mg by mouth daily.     MAGNESIUM OXIDE PO Take 200 mg by mouth 2 (two) times daily.     metFORMIN (GLUCOPHAGE-XR) 500 MG 24 hr tablet Take 500 mg by mouth 2 (two) times daily with a meal.     TRELEGY ELLIPTA 100-62.5-25 MCG/ACT AEPB Inhale 1 puff into the lungs daily.     No current facility-administered medications for this visit.    REVIEW OF SYSTEMS:  [X]  denotes positive finding, [ ]  denotes negative  finding Cardiac  Comments:  Chest pain or chest pressure:    Shortness of breath upon exertion:    Short of breath when lying flat:    Irregular heart rhythm:        Vascular    Pain in calf, thigh, or hip brought on by ambulation:    Pain in feet at night that wakes you up from your sleep:     Blood clot in your veins:    Leg swelling:         Pulmonary    Oxygen at home:    Productive cough:     Wheezing:         Neurologic    Sudden weakness in arms or legs:     Sudden numbness in arms or legs:     Sudden onset of difficulty speaking or slurred speech:    Temporary loss of vision in one eye:     Problems with dizziness:         Gastrointestinal    Blood in stool:     Vomited blood:         Genitourinary    Burning when urinating:     Blood in urine:        Psychiatric    Major depression:         Hematologic    Bleeding problems:    Problems with blood clotting too easily:        Skin    Rashes or ulcers:        Constitutional    Fever or chills:     PHYSICAL EXAM:   Vitals:   08/09/22 1016  BP: (!) 155/74  Pulse: 75  Resp: 20  Temp: 98.2 F (36.8 C)  SpO2: 96%  Weight: 220 lb (99.8 kg)  Height: 5\' 11"  (1.803 m)    GENERAL: The patient is a well-nourished male, in no acute distress. The vital signs are documented above. CARDIAC: There is a regular rate and rhythm.  VASCULAR: I do not detect carotid bruits. He has a palpable right femoral pulse.  He has a monophasic but brisk posterior tibial and dorsalis pedis signal with the Doppler. I cannot palpate a left femoral pulse.  He has a monophasic dorsalis pedis and posterior tibial signal.  Both are fairly brisk. He has hyperpigmentation bilaterally consistent with chronic venous insufficiency. PULMONARY: There is good air exchange bilaterally without wheezing or rales. ABDOMEN: Soft and non-tender with normal pitched bowel sounds.  MUSCULOSKELETAL: There are no major deformities or  cyanosis. NEUROLOGIC: No focal weakness or paresthesias are detected. SKIN: There are no ulcers or rashes  noted. PSYCHIATRIC: The patient has a normal affect.  DATA:    DUPLEX ABDOMINAL AORTA: I have independently interpreted his duplex of the abdominal aorta.  The maximum diameter of his infrarenal aorta is 2.35 cm.  The right common iliac artery measures 3.2 cm in maximum diameter.  This is increased only slightly compared to 2.7 over a year ago.  The left common iliac artery is occluded.    Waverly Ferrari [facility] [phone]

## 2022-08-10 ENCOUNTER — Other Ambulatory Visit: Payer: Self-pay

## 2022-08-10 ENCOUNTER — Inpatient Hospital Stay (HOSPITAL_BASED_OUTPATIENT_CLINIC_OR_DEPARTMENT_OTHER): Payer: Medicare HMO | Admitting: Hematology and Oncology

## 2022-08-10 ENCOUNTER — Inpatient Hospital Stay: Payer: Medicare HMO | Attending: Hematology and Oncology

## 2022-08-10 ENCOUNTER — Encounter: Payer: Self-pay | Admitting: Hematology and Oncology

## 2022-08-10 VITALS — BP 136/65 | HR 79 | Temp 97.8°F | Resp 20 | Wt 223.8 lb

## 2022-08-10 DIAGNOSIS — M25569 Pain in unspecified knee: Secondary | ICD-10-CM | POA: Insufficient documentation

## 2022-08-10 DIAGNOSIS — Z8572 Personal history of non-Hodgkin lymphomas: Secondary | ICD-10-CM | POA: Insufficient documentation

## 2022-08-10 DIAGNOSIS — Z6831 Body mass index (BMI) 31.0-31.9, adult: Secondary | ICD-10-CM

## 2022-08-10 DIAGNOSIS — C8338 Diffuse large B-cell lymphoma, lymph nodes of multiple sites: Secondary | ICD-10-CM

## 2022-08-10 LAB — CMP (CANCER CENTER ONLY)
ALT: 20 U/L (ref 0–44)
AST: 17 U/L (ref 15–41)
Albumin: 4.4 g/dL (ref 3.5–5.0)
Alkaline Phosphatase: 67 U/L (ref 38–126)
Anion gap: 8 (ref 5–15)
BUN: 19 mg/dL (ref 8–23)
CO2: 29 mmol/L (ref 22–32)
Calcium: 9.1 mg/dL (ref 8.9–10.3)
Chloride: 101 mmol/L (ref 98–111)
Creatinine: 0.87 mg/dL (ref 0.61–1.24)
GFR, Estimated: >60 mL/min (ref >60)
Glucose, Bld: 145 mg/dL — ABNORMAL HIGH (ref 70–99)
Potassium: 4.5 mmol/L (ref 3.5–5.1)
Sodium: 138 mmol/L (ref 135–145)
Total Bilirubin: 0.7 mg/dL (ref 0.3–1.2)
Total Protein: 6.8 g/dL (ref 6.5–8.1)

## 2022-08-10 LAB — CBC WITH DIFFERENTIAL (CANCER CENTER ONLY)
Abs Immature Granulocytes: 0.03 10*3/uL (ref 0.00–0.07)
Basophils Absolute: 0.1 10*3/uL (ref 0.0–0.1)
Basophils Relative: 1 %
Eosinophils Absolute: 0.5 10*3/uL (ref 0.0–0.5)
Eosinophils Relative: 5 %
HCT: 46.4 % (ref 39.0–52.0)
Hemoglobin: 15.9 g/dL (ref 13.0–17.0)
Immature Granulocytes: 0 %
Lymphocytes Relative: 21 %
Lymphs Abs: 2 10*3/uL (ref 0.7–4.0)
MCH: 30.8 pg (ref 26.0–34.0)
MCHC: 34.3 g/dL (ref 30.0–36.0)
MCV: 89.9 fL (ref 80.0–100.0)
Monocytes Absolute: 1.4 10*3/uL — ABNORMAL HIGH (ref 0.1–1.0)
Monocytes Relative: 14 %
Neutro Abs: 5.8 10*3/uL (ref 1.7–7.7)
Neutrophils Relative %: 59 %
Platelet Count: 179 10*3/uL (ref 150–400)
RBC: 5.16 MIL/uL (ref 4.22–5.81)
RDW: 13.6 % (ref 11.5–15.5)
WBC Count: 9.8 10*3/uL (ref 4.0–10.5)
nRBC: 0 % (ref 0.0–0.2)

## 2022-08-10 NOTE — Progress Notes (Signed)
[facility] OFFICE PROGRESS NOTE  Patient Care Team: Georgann Housekeeper, MD as PCP - General (Internal Medicine)  ASSESSMENT & PLAN:  History of lymphoma His exam is benign The patient is a long-term cancer survivor We discussed importance of lifestyle changes He does not need further future follow-up  Morbid obesity due to excess calories [facility]) The patient has stopped exercising since his last shoulder surgery He also have other orthopedic issues with knee pain We discussed importance of dietary changes and lifestyle modification Obesity could be associated with increased risk of cancer recurrence   No orders of the defined types were placed in this encounter.   All questions were answered. The patient knows to call the clinic with any problems, questions or concerns. The total time spent in the appointment was 20 minutes encounter with patients including review of chart and various tests results, discussions about plan of care and coordination of care plan   Artis Delay, MD 08/10/2022 9:53 AM  INTERVAL HISTORY: Please see below for problem oriented charting. he returns for surveillance follow-up for history of lymphoma He denies new lymphadenopathy The patient appears to have gained 10 pounds since his last visit He has knee pain causing difficulties with physical activity  REVIEW OF SYSTEMS:   Constitutional: Denies fevers, chills or abnormal weight loss Eyes: Denies blurriness of vision Ears, nose, mouth, throat, and face: Denies mucositis or sore throat Respiratory: Denies cough, dyspnea or wheezes Cardiovascular: Denies palpitation, chest discomfort or lower extremity swelling Gastrointestinal:  Denies nausea, heartburn or change in bowel habits Skin: Denies abnormal skin rashes Lymphatics: Denies new lymphadenopathy or easy bruising Neurological:Denies numbness, tingling or new weaknesses Behavioral/Psych: Mood is stable, no new changes  All other systems  were reviewed with the patient and are negative.  I have reviewed the past medical history, past surgical history, social history and family history with the patient and they are unchanged from previous note.  ALLERGIES:  is allergic to bee venom, metformin, and sulfa drugs cross reactors.  MEDICATIONS:  Current Outpatient Medications  Medication Sig Dispense Refill   amLODipine (NORVASC) 10 MG tablet Take 10 mg by mouth every morning.      atorvastatin (LIPITOR) 40 MG tablet Take 40 mg by mouth at bedtime.     Cholecalciferol (VITAMIN D3) 25 MCG TABS Take 1,000 mcg by mouth daily.     clopidogrel (PLAVIX) 75 MG tablet Take 75 mg by mouth daily.     lisinopril (PRINIVIL,ZESTRIL) 5 MG tablet Take 5 mg by mouth daily.     MAGNESIUM OXIDE PO Take 200 mg by mouth 2 (two) times daily.     metFORMIN (GLUCOPHAGE-XR) 500 MG 24 hr tablet Take 500 mg by mouth 2 (two) times daily with a meal.     TRELEGY ELLIPTA 100-62.5-25 MCG/ACT AEPB Inhale 1 puff into the lungs daily.     No current facility-administered medications for this visit.    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 Stable ectasia or post stenotic dilatation of the right external iliac artery measuring up to 1.5 cm. Extensive atherosclerotic disease involving the abdominal aorta with a chronic intimal  flap or short segment dissection in the infrarenal abdominal aorta. Chronic occlusion of the left common iliac artery. Slowly enlarging retroperitoneal lymph nodes as described. An indolent neoplastic or lymphoproliferative process cannot be excluded   01/06/2016 Imaging   Ct abdomen and pelvis showed new  extensive centrally necrotic retroperitoneal and bilateral pelvic lymphadenopathy. Differential includes lymphoproliferative condition (lymphoma/leukemia) or infectious lymphadenitis (such as due to bacterial, viral or mycobacterial etiologies). Squamous cell carcinoma metastases can have a similar appearance, although this is less likely given the location. Clinical and laboratory evaluation for a lymphoproliferative condition and tissue sampling is advised. 2. Aortic atherosclerosis. Stable 3.1 cm infrarenal abdominal aortic aneurysm   01/27/2016 Imaging   CH chest showed no acute cardiopulmonary disease. Known stable retrocrural and periaortic adenopathy. Oval density over the left neck base measuring 2.5 x 4.5 cm likely due partially to prominent internal jugular vein, although cannot exclude adenopathy in this region. Recommend neck CT with contrast. Three vessel atherosclerotic coronary artery disease. Few small thyroid nodules with the largest measuring 1.2 cm over the left lobe. 2.4 cm left renal cyst. Sub cm right renal hypodensity unchanged and too small to characterize but likely a cyst. Minimal cholelithiasis.   01/31/2016 Imaging   ECHO showed: Left ventricle: The cavity size was normal. Systolic function was normal. The estimated ejection fraction was in the range of 60% to 65%. Wall motion was normal; there were no regional wall motion abnormalities. There was an increased relative contribution of atrial contraction to ventricular filling. Doppler parameters are consistent with abnormal left ventricular relaxation (grade 1 diastolic dysfunction).   02/07/2016 Procedure   He had imaging guided biopsy of supraclavicular lymph node   02/07/2016 Pathology Results   Accession: [id number] Core biopsies reveal diffuse areas of large atypical lymphoid cells. There are multiple cells with very large irregular nuclei or multinucleated cells. Immunohistochemistry reveals the cells are positive for  CD20, CD45, bcl-2, and bcl-6. They are negative for CD10, CD30, and ALK. CD3, CD4, CD5 and CD8 reveal scattered T-cells. Ki-67 reveals an elevated proliferation rate (95%). The cells are negative for cytokeratin 7, cytokeratin 20, TTF-1, CDX-2, HMB45, SOX11, and MelanA. 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 He received cycle 1 of chemotherapy will hospitalized   02/14/2016 Imaging   Ct abdomen and pelvis showed widespread retrocrural, retroperitoneal, and bilateral pelvic lymphadenopathy, waxing/waning when compared to recent CT, as above. Mild right hydronephrosis, likely secondary to extrinsic compression by right retroperitoneal lymphadenopathy, with associated diminished enhancement of the right kidney. Spleen is normal in size.    02/16/2016 Bone Marrow Biopsy   Bone marrow biopsy was negative for lymphoma involvement. Cytogenetics came back abnormal: -Y in 45% of cell lines   02/16/2016 Procedure   He had port placement   02/16/2016 - 02/21/2016 Chemotherapy   He received cycle 1 of R-EPOCH    03/08/2016 - 03/12/2016 Hospital Admission   The patient received cycle 2 of chemotherapy in the hospital   03/27/2016 PET scan   Significant decrease in size of abdominal and pelvic lymphadenopathy compared to previous CT, which shows low-grade metabolic activity. No evidence of active lymphoma within the neck or chest. 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 Continued annual follow-up by CT is recommended.    04/03/2016 - 04/07/2016 Hospital Admission   The patient received cycle 3 of chemotherapy in the hospital   04/20/2016  Procedure   He underwent successful intrathecal injection of chemotherapy without complication   04/20/2016 Pathology Results   [id number] CSF fluid negative for malignant cells   04/24/2016 - 04/28/2016 Hospital Admission   The patient  received cycle 4 of chemotherapy in the hospital   05/15/2016 - 05/19/2016 Hospital Admission   He received cycle 5 of chemotherapy in the hospital   05/17/2016 Procedure   Successful intrathecal injection of methotrexate.   06/05/2016 - 06/09/2016 Hospital Admission   He is admitted to the hospital for cycle 6 of chemotherapy   06/07/2016 Procedure   Successful intrathecal chemotherapy injection   06/27/2016 PET scan   Further reduction in size and metabolic activity of lower abdominal and pelvic lymph nodes indicating response to therapy. 2. 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 3. Other imaging findings of potential clinical significance: Mucous retention cyst in the left maxillary sinus. Right mastoid effusion. Left renal cyst. Coronary, aortic arch, and branch vessel atherosclerotic vascular disease. Aortoiliac atherosclerotic vascular disease. Prominent stool throughout the colon favors constipation. Indirect left inguinal hernia containing adipose tissue. Cholelithiasis.   06/28/2016 Procedure   Intrathecal injection of chemotherapy without complication   09/26/2016 PET scan   1. Small LEFT external iliac lymph node is increased metabolic activity compared to prior (similar to liver activity ( [place] 3). No additional hypermetabolic adenopathy in the chest, abdomen, or pelvis. Recommend attention on follow-up. 2. Normal spleen and marrow.   12/19/2016 PET scan   1. No abnormal hypermetabolism in the neck, chest, abdomen or pelvis. 2. Aortic atherosclerosis (ICD10-170.0). Three-vessel coronary artery calcification. 3. Right common iliac artery aneurysm. 4. Cholelithiasis.   07/08/2017 Imaging   1. No definite findings to suggest recurrent lymphoma in the chest, abdomen or pelvis. 2. Spectrum of findings in the right lung, most evident in the right lower lobe, suggestive of very mild bronchopneumonia. 3. Aortic atherosclerosis, in addition to three-vessel coronary  artery disease. Assessment for potential risk factor modification, dietary therapy or pharmacologic therapy may be warranted, if clinically indicated. 4. In addition, there is aneurysmal dilatation of the right common iliac artery which is larger than the prior study, currently measuring 3.0 x 2.8 cm. 5. Left inguinal hernia containing fat. 6. Additional incidental findings, as above.  Aortic Atherosclerosis (ICD10-I70.0).   02/14/2018 Imaging   1. Asymmetric soft tissue swelling with inflammatory stranding involving the right face as above, consistent with acute infection/cellulitis. Cellulitic infections at this location are often odontogenic in origin, and note is made of dehiscence of the alveolar ridge of the right first maxillary molar, which could serve as a source of infection. No discrete abscess or drainable fluid collection identified. 2. Mild asymmetric prominence of subcentimeter right cervical lymph nodes, favored to be reactive in nature. No pathologically enlarged adenopathy within the neck. 3. Chronic right-sided paranasal sinus disease as above.   07/28/2018 Imaging   1. No current findings of active lymphoma. 2. Right common iliac artery aneurysm, 2.8 cm in diameter, surveillance suggested. 3. Other imaging findings of potential clinical significance: Aortic Atherosclerosis (ICD10-I70.0). Coronary atherosclerosis. Left thyroid nodule, present but not hypermetabolic on prior PET-CT hence likely benign. Aortic valve calcification. Gynecomastia. Faint chronic nodularity in the right upper lobe, felt to be benign. Airway thickening is present, suggesting bronchitis or reactive airways disease. Borderline appearance for constipation. Bilateral direct inguinal hernias contain adipose tissue. Lumbar impingement at several levels due to spondylosis and degenerative disc disease.   09/16/2018 Procedure   Removal of implanted Port-A-Cath  utilizing sharp and blunt dissection. The procedure  was uncomplicated.   08/05/2019 Imaging   No suspicious lymphadenopathy in the chest, abdomen, or pelvis.   Spleen is normal in size.   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 Continued attention on follow-up is suggested.   Additional stable ancillary findings as above     PHYSICAL EXAMINATION: ECOG PERFORMANCE STATUS: 0 - Asymptomatic  Vitals:   08/10/22 0936  BP: 136/65  Pulse: 79  Resp: 20  Temp: 97.8 F (36.6 C)  SpO2: 98%   Filed Weights   08/10/22 0936  Weight: 223 lb 12.8 oz (101.5 kg)    GENERAL:alert, no distress and comfortable SKIN: skin color, texture, turgor are normal, no rashes or significant lesions EYES: normal, Conjunctiva are pink and non-injected, sclera clear OROPHARYNX:no exudate, no erythema and lips, buccal mucosa, and tongue normal  NECK: supple, thyroid normal size, non-tender, without nodularity LYMPH:  no palpable lymphadenopathy in the cervical, axillary or inguinal LUNGS: clear to auscultation and percussion with normal breathing effort HEART: regular rate & rhythm and no murmurs and no lower extremity edema ABDOMEN:abdomen soft, non-tender and normal bowel sounds Musculoskeletal:no cyanosis of digits and no clubbing  NEURO: alert & oriented x 3 with fluent speech, no focal motor/sensory deficits  LABORATORY DATA:  I have reviewed the data as listed    Component Value Date/Time   NA 138 08/10/2022 0918   NA 141 12/18/2016 1403   K 4.5 08/10/2022 0918   K 4.4 12/18/2016 1403   CL 101 08/10/2022 0918   CO2 29 08/10/2022 0918   CO2 28 12/18/2016 1403   GLUCOSE 145 (H) 08/10/2022 0918   GLUCOSE 125 12/18/2016 1403   BUN 19 08/10/2022 0918   BUN 19.3 12/18/2016 1403   CREATININE 0.87 08/10/2022 0918   CREATININE 1.0 12/18/2016 1403   CALCIUM 9.1 08/10/2022 0918   CALCIUM 9.6 12/18/2016 1403   PROT 6.8 08/10/2022 0918   PROT 6.4 12/18/2016 1403   ALBUMIN 4.4 08/10/2022 0918   ALBUMIN 3.9 12/18/2016 1403   AST 17  08/10/2022 0918   AST 16 12/18/2016 1403   ALT 20 08/10/2022 0918   ALT 16 12/18/2016 1403   ALKPHOS 67 08/10/2022 0918   ALKPHOS 66 12/18/2016 1403   BILITOT 0.7 08/10/2022 0918   BILITOT 0.34 12/18/2016 1403   GFRNONAA >60 08/10/2022 0918   GFRAA >60 08/05/2019 1146   GFRAA >60 07/28/2018 1057    No results found for: "SPEP", "UPEP"  Lab Results  Component Value Date   WBC 9.8 08/10/2022   NEUTROABS 5.8 08/10/2022   HGB 15.9 08/10/2022   HCT 46.4 08/10/2022   MCV 89.9 08/10/2022   PLT 179 08/10/2022      Chemistry      Component Value Date/Time   NA 138 08/10/2022 0918   NA 141 12/18/2016 1403   K 4.5 08/10/2022 0918   K 4.4 12/18/2016 1403   CL 101 08/10/2022 0918   CO2 29 08/10/2022 0918   CO2 28 12/18/2016 1403   BUN 19 08/10/2022 0918   BUN 19.3 12/18/2016 1403   CREATININE 0.87 08/10/2022 0918   CREATININE 1.0 12/18/2016 1403      Component Value Date/Time   CALCIUM 9.1 08/10/2022 0918   CALCIUM 9.6 12/18/2016 1403   ALKPHOS 67 08/10/2022 0918   ALKPHOS 66 12/18/2016 1403   AST 17 08/10/2022 0918   AST 16 12/18/2016 1403   ALT 20 08/10/2022 0918   ALT 16 12/18/2016 1403  BILITOT 0.7 08/10/2022 0918   BILITOT 0.34 12/18/2016 1403       RADIOGRAPHIC STUDIES: I have personally reviewed the radiological images as listed and agreed with the findings in the report. VAS [place] AAA DUPLEX  Result Date: 08/09/2022 ABDOMINAL AORTA STUDY Patient Name:  BRAIDYN ROTMAN  Date of Exam:   08/09/2022 Medical Rec #: [id number]         Accession #:    [id number] Date of Birth: 03-16-1946         Patient Gender: M Patient Age:   44 years Exam Location:  [facility] Procedure:      VAS [place] AAA DUPLEX Referring Phys: Cristal Deer DICKSON --------------------------------------------------------------------------------  Indications: Follow up exam for known AAA. Risk Factors: Hypertension, hyperlipidemia. Limitations: Air/bowel gas and obesity.  Comparison Study:  12/21/21: Aorta WNL Right CIA 3.1 x 3.1 cm Left CIA not                   viusalized. Technically limited exam. Performing Technologist: Thereasa Parkin RVT  Examination Guidelines: A complete evaluation includes B-mode imaging, spectral Doppler, color Doppler, and power Doppler as needed of all accessible portions of each vessel. Bilateral testing is considered an integral part of a complete examination. Limited examinations for reoccurring indications may be performed as noted.  Abdominal Aorta Findings: +-------------+------+----------+---------+----------+--------+----------------+ Location     AP    Trans (cm)PSV      Waveform  ThrombusComments                      (cm)            (cm/s)                                      +-------------+------+----------+---------+----------+--------+----------------+ Proximal     2.06  2.06      51                                          +-------------+------+----------+---------+----------+--------+----------------+ Mid          2.35  2.29      41                                          +-------------+------+----------+---------+----------+--------+----------------+ Distal       2.16            25                                          +-------------+------+----------+---------+----------+--------+----------------+ RT CIA Prox  3.1   3.2       36       monophasic        505 cm/s                                                                 prox/mid         +-------------+------+----------+---------+----------+--------+----------------+  RT CIA Mid   2.2   2.1       268      monophasic                         +-------------+------+----------+---------+----------+--------+----------------+ RT CIA Distal                241      monophasic                         +-------------+------+----------+---------+----------+--------+----------------+ RT EIA Prox                  152      monophasic                          +-------------+------+----------+---------+----------+--------+----------------+ RT EIA Mid                   111      monophasic                         +-------------+------+----------+---------+----------+--------+----------------+ RT EIA Distal                116      monophasic                         +-------------+------+----------+---------+----------+--------+----------------+ LT CIA Prox                                             appears occluded +-------------+------+----------+---------+----------+--------+----------------+ LT CIA Mid                                              appears occluded +-------------+------+----------+---------+----------+--------+----------------+ LT CIA Distal                58       monophasic                         +-------------+------+----------+---------+----------+--------+----------------+ LT EIA Prox                  54       monophasic                         +-------------+------+----------+---------+----------+--------+----------------+ LT EIA Mid                   73       biphasic                           +-------------+------+----------+---------+----------+--------+----------------+ LT EIA Distal                52       monophasic                         +-------------+------+----------+---------+----------+--------+----------------+ Left internal ilial artery 48 cm/s.  Summary: Abdominal Aorta: No evidence of an abdominal aortic aneurysm was visualized. There is evidence of abnormal dilation of the Right Common Iliac artery. >50% stenosis in the  prox/mid common iliac artery. The left common iliac artery appears occluded with retrograde filling of the external iliac via the internal iliac.  *See table(s) above for measurements and observations.  Electronically signed by Waverly Ferrari MD on 08/09/2022 at 10:26:47 AM.    Final

## 2022-08-10 NOTE — Assessment & Plan Note (Signed)
The patient has stopped exercising since his last shoulder surgery He also have other orthopedic issues with knee pain We discussed importance of dietary changes and lifestyle modification Obesity could be associated with increased risk of cancer recurrence

## 2022-08-10 NOTE — Assessment & Plan Note (Signed)
His exam is benign The patient is a long-term cancer survivor We discussed importance of lifestyle changes He does not need further future follow-up

## 2022-08-14 DIAGNOSIS — J069 Acute upper respiratory infection, unspecified: Secondary | ICD-10-CM | POA: Diagnosis not present

## 2022-08-14 DIAGNOSIS — J449 Chronic obstructive pulmonary disease, unspecified: Secondary | ICD-10-CM | POA: Diagnosis not present

## 2022-08-21 ENCOUNTER — Other Ambulatory Visit: Payer: Self-pay

## 2022-08-21 DIAGNOSIS — I6523 Occlusion and stenosis of bilateral carotid arteries: Secondary | ICD-10-CM

## 2022-08-21 DIAGNOSIS — I723 Aneurysm of iliac artery: Secondary | ICD-10-CM

## 2022-09-28 DIAGNOSIS — S59919A Unspecified injury of unspecified forearm, initial encounter: Secondary | ICD-10-CM | POA: Diagnosis not present

## 2022-09-28 DIAGNOSIS — I10 Essential (primary) hypertension: Secondary | ICD-10-CM | POA: Diagnosis not present

## 2022-09-28 DIAGNOSIS — S3993XA Unspecified injury of pelvis, initial encounter: Secondary | ICD-10-CM | POA: Diagnosis not present

## 2022-09-28 DIAGNOSIS — Z79899 Other long term (current) drug therapy: Secondary | ICD-10-CM | POA: Diagnosis not present

## 2022-09-28 DIAGNOSIS — S0993XA Unspecified injury of face, initial encounter: Secondary | ICD-10-CM | POA: Diagnosis not present

## 2022-09-28 DIAGNOSIS — S299XXA Unspecified injury of thorax, initial encounter: Secondary | ICD-10-CM | POA: Diagnosis not present

## 2022-09-28 DIAGNOSIS — K219 Gastro-esophageal reflux disease without esophagitis: Secondary | ICD-10-CM | POA: Diagnosis not present

## 2022-09-28 DIAGNOSIS — G4733 Obstructive sleep apnea (adult) (pediatric): Secondary | ICD-10-CM | POA: Diagnosis not present

## 2022-09-28 DIAGNOSIS — S199XXA Unspecified injury of neck, initial encounter: Secondary | ICD-10-CM | POA: Diagnosis not present

## 2022-09-28 DIAGNOSIS — J449 Chronic obstructive pulmonary disease, unspecified: Secondary | ICD-10-CM | POA: Diagnosis not present

## 2022-09-28 DIAGNOSIS — S0031XA Abrasion of nose, initial encounter: Secondary | ICD-10-CM | POA: Diagnosis not present

## 2022-09-28 DIAGNOSIS — I4891 Unspecified atrial fibrillation: Secondary | ICD-10-CM | POA: Diagnosis not present

## 2022-09-28 DIAGNOSIS — I739 Peripheral vascular disease, unspecified: Secondary | ICD-10-CM | POA: Diagnosis not present

## 2022-09-28 DIAGNOSIS — E119 Type 2 diabetes mellitus without complications: Secondary | ICD-10-CM | POA: Diagnosis not present

## 2022-09-28 DIAGNOSIS — E785 Hyperlipidemia, unspecified: Secondary | ICD-10-CM | POA: Diagnosis not present

## 2022-09-28 DIAGNOSIS — W19XXXA Unspecified fall, initial encounter: Secondary | ICD-10-CM | POA: Diagnosis not present

## 2022-09-28 DIAGNOSIS — R109 Unspecified abdominal pain: Secondary | ICD-10-CM | POA: Diagnosis not present

## 2022-09-28 DIAGNOSIS — S0990XA Unspecified injury of head, initial encounter: Secondary | ICD-10-CM | POA: Diagnosis not present

## 2022-09-28 DIAGNOSIS — W130XXA Fall from, out of or through balcony, initial encounter: Secondary | ICD-10-CM | POA: Diagnosis not present

## 2022-09-28 DIAGNOSIS — R102 Pelvic and perineal pain: Secondary | ICD-10-CM | POA: Diagnosis not present

## 2022-09-28 DIAGNOSIS — E1151 Type 2 diabetes mellitus with diabetic peripheral angiopathy without gangrene: Secondary | ICD-10-CM | POA: Diagnosis not present

## 2022-09-28 DIAGNOSIS — S2242XA Multiple fractures of ribs, left side, initial encounter for closed fracture: Secondary | ICD-10-CM | POA: Diagnosis not present

## 2022-09-29 DIAGNOSIS — M25562 Pain in left knee: Secondary | ICD-10-CM | POA: Diagnosis not present

## 2022-09-29 DIAGNOSIS — E119 Type 2 diabetes mellitus without complications: Secondary | ICD-10-CM | POA: Diagnosis not present

## 2022-09-29 DIAGNOSIS — I739 Peripheral vascular disease, unspecified: Secondary | ICD-10-CM | POA: Diagnosis not present

## 2022-09-29 DIAGNOSIS — S0031XA Abrasion of nose, initial encounter: Secondary | ICD-10-CM | POA: Diagnosis not present

## 2022-09-29 DIAGNOSIS — R079 Chest pain, unspecified: Secondary | ICD-10-CM | POA: Diagnosis not present

## 2022-09-29 DIAGNOSIS — J449 Chronic obstructive pulmonary disease, unspecified: Secondary | ICD-10-CM | POA: Diagnosis not present

## 2022-09-29 DIAGNOSIS — W130XXA Fall from, out of or through balcony, initial encounter: Secondary | ICD-10-CM | POA: Diagnosis not present

## 2022-09-29 DIAGNOSIS — S2242XA Multiple fractures of ribs, left side, initial encounter for closed fracture: Secondary | ICD-10-CM | POA: Diagnosis not present

## 2022-09-29 DIAGNOSIS — I069 Rheumatic aortic valve disease, unspecified: Secondary | ICD-10-CM | POA: Diagnosis not present

## 2022-09-29 DIAGNOSIS — I480 Paroxysmal atrial fibrillation: Secondary | ICD-10-CM | POA: Diagnosis not present

## 2022-09-29 DIAGNOSIS — I10 Essential (primary) hypertension: Secondary | ICD-10-CM | POA: Diagnosis not present

## 2022-09-29 DIAGNOSIS — E785 Hyperlipidemia, unspecified: Secondary | ICD-10-CM | POA: Diagnosis not present

## 2022-10-10 DIAGNOSIS — M79606 Pain in leg, unspecified: Secondary | ICD-10-CM | POA: Diagnosis not present

## 2022-10-10 DIAGNOSIS — I4891 Unspecified atrial fibrillation: Secondary | ICD-10-CM | POA: Diagnosis not present

## 2022-10-10 DIAGNOSIS — S8012XD Contusion of left lower leg, subsequent encounter: Secondary | ICD-10-CM | POA: Diagnosis not present

## 2022-10-11 ENCOUNTER — Other Ambulatory Visit: Payer: Self-pay | Admitting: Internal Medicine

## 2022-10-11 ENCOUNTER — Ambulatory Visit
Admission: RE | Admit: 2022-10-11 | Discharge: 2022-10-11 | Disposition: A | Discharge status: Home / Self Care | Payer: Medicare HMO | Source: Ambulatory Visit | Attending: Internal Medicine | Admitting: Internal Medicine

## 2022-10-11 DIAGNOSIS — M79605 Pain in left leg: Secondary | ICD-10-CM | POA: Diagnosis not present

## 2022-10-11 DIAGNOSIS — M79603 Pain in arm, unspecified: Secondary | ICD-10-CM

## 2022-10-11 DIAGNOSIS — S8012XA Contusion of left lower leg, initial encounter: Secondary | ICD-10-CM | POA: Diagnosis not present

## 2022-10-29 DIAGNOSIS — J449 Chronic obstructive pulmonary disease, unspecified: Secondary | ICD-10-CM | POA: Diagnosis not present

## 2022-10-29 DIAGNOSIS — E7800 Pure hypercholesterolemia, unspecified: Secondary | ICD-10-CM | POA: Diagnosis not present

## 2022-10-29 DIAGNOSIS — I700 Atherosclerosis of aorta: Secondary | ICD-10-CM | POA: Diagnosis not present

## 2022-10-29 DIAGNOSIS — I10 Essential (primary) hypertension: Secondary | ICD-10-CM | POA: Diagnosis not present

## 2022-10-29 DIAGNOSIS — D6859 Other primary thrombophilia: Secondary | ICD-10-CM | POA: Diagnosis not present

## 2022-10-29 DIAGNOSIS — I714 Abdominal aortic aneurysm, without rupture, unspecified: Secondary | ICD-10-CM | POA: Diagnosis not present

## 2022-10-29 DIAGNOSIS — E1151 Type 2 diabetes mellitus with diabetic peripheral angiopathy without gangrene: Secondary | ICD-10-CM | POA: Diagnosis not present

## 2022-10-29 DIAGNOSIS — C8510 Unspecified B-cell lymphoma, unspecified site: Secondary | ICD-10-CM | POA: Diagnosis not present

## 2022-10-29 DIAGNOSIS — G4733 Obstructive sleep apnea (adult) (pediatric): Secondary | ICD-10-CM | POA: Diagnosis not present

## 2022-11-12 NOTE — Progress Notes (Unsigned)
Cardiology Office Note:   Date:  11/14/2022  NAME:  Samuel Berger    MRN: [id number] DOB:  Mar 20, 1946   PCP:  Georgann Housekeeper, MD  Cardiologist:  Reatha Harps, MD  Electrophysiologist:  None   Referring MD: Georgann Housekeeper, MD   Chief Complaint  Patient presents with   Atrial Fibrillation    History of Present Illness:   Samuel Berger is a 77 y.o. male with a hx of carotid artery disease, hypertension, hyperlipidemia who is being seen today for the evaluation of paroxysmal atrial fibrillation at the request of Georgann Housekeeper, MD. admitted to [facility] in [place].  This was in June 2024.  He had a fall and suffered left posterior rib fractures.  Was in A-fib with RVR but converted back to sinus rhythm prior to discharge.  Echo was normal.  He was placed on Eliquis.  Follow-up today.  He reports he is unaware of his A-fib.  Per review of records he converted back to sinus rhythm while in the hospital.  He is in sinus rhythm today.  No symptoms of palpitations, chest pains or trouble breathing.  He is not exercising but is still getting over his orthopedic issues from his fall.  He does describe some leg pain and leg swelling.  He does have a history of carotid artery disease status post CEA.  Was on Plavix.  Now just on Eliquis monotherapy.  I agree with this.  He has hypertension but blood pressure is controlled.  He is diabetic with an A1c of 7.0.  LDL could be a bit lower.  Most recent value 85.  Acceptable currently.  He does have a right bundle branch block which is chronic.  He CV exam is unremarkable.  No regular exercise but no symptoms today.  He tells me his wife is a Development worker, community.  He is married with 2 children and 2 grandchildren.  He was helping her move an elliptical exercise machine when he suffered a fall.  He will not do this anymore.  He does not smoke.  Reports alcohol use in moderation.  He is a retired Airline pilot person.  Problem List Carotid artery disease/CVA -R CEA  2013 2. R common iliac artery aneurysm  3. Lymphoma (remission) 4. HTN 5. HLD -T chol 157, HDL 55, LDL 85, TG 88 6. DM -A1c 7.0 7. Paroxysmal Afib -Dx 09/2022 in setting of fall -spontaneous conversion to NSR -CHADSVASC=7 (age, CVA, PAD, HTN, DM) 8. RBBB  Past Medical History: Past Medical History:  Diagnosis Date   Aneurysm artery, iliac (HCC)    Arrhythmia    Carotid artery occlusion    Dyslipidemia    GERD (gastroesophageal reflux disease)    Hepatitis    ? age 59 or 45   Hyperlipidemia    Hypertension    Iliac artery aneurysm, right (HCC)    large b cell lymphoma dx'd 02/2016   Stroke (HCC)    01-05-2012   02-2012   Type II or unspecified type diabetes mellitus without mention of complication, not stated as uncontrolled     Past Surgical History: Past Surgical History:  Procedure Laterality Date   basket procedure     CAROTID ENDARTERECTOMY     ENDARTERECTOMY  02/08/2012   Procedure: ENDARTERECTOMY CAROTID;  Surgeon: Pryor Ochoa, MD;  Location: [facility] OR;  Service: Vascular;  Laterality: Right;   IR GENERIC HISTORICAL  02/07/2016   IR US GUIDE BX ASP/DRAIN 02/07/2016 Berdine Dance, MD [name]  IR GENERIC HISTORICAL  02/16/2016   IR FLUORO GUIDE PORT INSERTION RIGHT 02/16/2016 Irish Lack, MD [name]   IR GENERIC HISTORICAL  02/16/2016   IR US GUIDE VASC ACCESS RIGHT 02/16/2016 Irish Lack, MD [name]   IR REMOVAL TUN ACCESS W/ PORT W/O FL MOD SED  09/16/2018   KIDNEY STONE SURGERY     removal   PATCH ANGIOPLASTY  02/08/2012   Procedure: PATCH ANGIOPLASTY;  Surgeon: Pryor Ochoa, MD;  Location: [facility] OR;  Service: Vascular;  Laterality: Right;  with dacron patch angioplasty   REVERSE SHOULDER ARTHROPLASTY Left 12/28/2021   Procedure: REVERSE SHOULDER ARTHROPLASTY;  Surgeon: Francena Hanly, MD;  Location: [name];  Service: Orthopedics;  Laterality: Left;    TEE WITHOUT CARDIOVERSION  01/08/2012   Procedure: TRANSESOPHAGEAL  ECHOCARDIOGRAM (TEE);  Surgeon: Lewayne Bunting, MD;  Location: [facility] ENDOSCOPY;  Service: Cardiovascular;  Laterality: N/A;   TONSILLECTOMY      Current Medications: Current Meds  Medication Sig   amLODipine (NORVASC) 10 MG tablet Take 10 mg by mouth every morning.    atorvastatin (LIPITOR) 40 MG tablet Take 40 mg by mouth at bedtime.   Cholecalciferol (VITAMIN D3) 25 MCG TABS Take 1,000 mcg by mouth daily.   ELIQUIS 5 MG TABS tablet Take 5 mg by mouth 2 (two) times daily.   lisinopril (PRINIVIL,ZESTRIL) 5 MG tablet Take 5 mg by mouth daily.   MAGNESIUM OXIDE PO Take 200 mg by mouth 2 (two) times daily.   metFORMIN (GLUCOPHAGE-XR) 500 MG 24 hr tablet Take 500 mg by mouth 2 (two) times daily with a meal.   TRELEGY ELLIPTA 100-62.5-25 MCG/ACT AEPB Inhale 1 puff into the lungs daily.     Allergies:    Bee venom and Sulfa drugs cross reactors   Social History: Social History   Socioeconomic History   Marital status: Married    Spouse name: anne   Number of children: 2   Years of education: college   Highest education level: Not on file  Occupational History   Occupation: retired   Occupation: Retired Airline pilot  Tobacco Use   Smoking status: Former    Current packs/day: 0.00    Average packs/day: 0.5 packs/day for 30.0 years (15.0 ttl pk-yrs)    Types: Cigars, Cigarettes    Start date: 01/04/1982    Quit date: 01/05/2012    Years since quitting: 10.8   Smokeless tobacco: Former  Building services engineer status: Never Used  Substance and Sexual Activity   Alcohol use: Yes    Alcohol/week: 3.0 standard drinks of alcohol    Types: 1 Glasses of wine, 1 Cans of beer, 1 Shots of liquor per week    Comment: quit 2 months..Patient drinks coffee daily.OCCASIONAL DRINKER   Drug use: No   Sexual activity: Not Currently  Other Topics Concern   Not on file  Social History Narrative   Not on file   Social Determinants of Health   Financial Resource Strain: Not on file  Food Insecurity: Not  on file  Transportation Needs: Not on file  Physical Activity: Not on file  Stress: Not on file  Social Connections: Not on file     Family History: The patient's family history includes Diabetes in his mother; Lung cancer in his father.  ROS:   All other ROS reviewed and negative. Pertinent positives noted in the HPI.     EKGs/Labs/Other Studies Reviewed:   The following studies were personally reviewed by me  today:  EKG:  EKG is ordered today.    EKG Interpretation Date/Time:  Wednesday November 14 2022 08:15:51 EDT Ventricular Rate:  62 PR Interval:  142 QRS Duration:  120 QT Interval:  442 QTC Calculation: 448 R Axis:   -20  Text Interpretation: Normal sinus rhythm Right bundle branch block Confirmed by Lennie Odor ([id number]) on 11/14/2022 8:17:03 AM  TTE 09/28/2022 Result Date: 09/29/2022 1. Left ventricle: The cavity size is normal. Systolic function is normal. The estimated ejection fraction is 55-60%. Wall motion is normal; there are no regional wall motion abnormalities. Normal diastolic function. 2. Aortic valve: There is thickening, consistent with sclerosis. 3. Right ventricle: The cavity size is normal. Systolic function is normal.    Recent Labs: 08/10/2022: ALT 20; BUN 19; Creatinine 0.87; Hemoglobin 15.9; Platelet Count 179; Potassium 4.5; Sodium 138   Recent Lipid Panel    Component Value Date/Time   CHOL 140 01/06/2012 0431   TRIG 71 01/06/2012 0431   HDL 43 01/06/2012 0431   CHOLHDL 3.3 01/06/2012 0431   VLDL 14 01/06/2012 0431   LDLCALC 83 01/06/2012 0431    Physical Exam:   VS:  BP 128/70 (BP Location: Left Arm, Patient Position: Sitting, Cuff Size: Normal)   Pulse 65   Ht 5\' 11"  (1.803 m)   Wt 222 lb (100.7 kg)   SpO2 98%   BMI 30.96 kg/m    Wt Readings from Last 3 Encounters:  11/14/22 222 lb (100.7 kg)  08/10/22 223 lb 12.8 oz (101.5 kg)  08/09/22 220 lb (99.8 kg)    General: Well nourished, well developed, in no acute distress Head:  Atraumatic, normal size  Eyes: PEERLA, EOMI  Neck: Supple, no JVD Endocrine: No thryomegaly Cardiac: Normal S1, S2; RRR; no murmurs, rubs, or gallops Lungs: Clear to auscultation bilaterally, no wheezing, rhonchi or rales  Abd: Soft, nontender, no hepatomegaly  Ext: No edema, pulses 2+ Musculoskeletal: No deformities, BUE and BLE strength normal and equal Skin: Warm and dry, no rashes   Neuro: Alert and oriented to person, place, time, and situation, CNII-XII grossly intact, no focal deficits  Psych: Normal mood and affect   ASSESSMENT:   SAMEE CASPAR is a 77 y.o. male who presents for the following: 1. Paroxysmal atrial fibrillation (HCC)   2. Bilateral carotid artery stenosis   3. Mixed hyperlipidemia   4. Renovascular hypertension     PLAN:   1. Paroxysmal atrial fibrillation (HCC) -Atrial fibrillation diagnosed in the setting of fall in June.  In sinus rhythm today.  No recurrence of arrhythmia from what I can tell.  Will like to proceed with a 14-day ZIO to determine his A-fib burden.  We did discuss that this is likely situational and triggered by the stress of the fall.  His CHA2DS2-VASc equals 7 due to considerable stroke risk factors.  I have recommended he remain on Eliquis 5 mg twice daily.  This is the correct dose.  He had an echo in [place] that was normal.  Reports no symptoms of angina.  No stress test is needed at this time.  He will see me back in 6 months to discuss further.  2. Bilateral carotid artery stenosis -Status post right CEA.  Yearly surveillance.  3. Mixed hyperlipidemia -LDL 85.  On Lipitor 40.  Would likely benefit from addition of Zetia.  Will discuss at follow-up visit.  4. Renovascular hypertension -Controlled on current medications.  Disposition: Return in about 6 months (around 05/17/2023).  Medication Adjustments/Labs  and Tests Ordered: Current medicines are reviewed at length with the patient today.  Concerns regarding medicines are  outlined above.  Orders Placed This Encounter  Procedures   EKG 12-Lead   No orders of the defined types were placed in this encounter.  Patient Instructions  Medication Instructions:  Your physician recommends that you continue on your current medications as directed. Please refer to the Current Medication list given to you today.   *If you need a refill on your cardiac medications before your next appointment, please call your pharmacy*   Lab Work: None ordered   Testing/Procedures: Your physician has recommended that you wear a holter monitor. -14 day zio Holter monitors are medical devices that record the heart's electrical activity. Doctors most often use these monitors to diagnose arrhythmias. Arrhythmias are problems with the speed or rhythm of the heartbeat. The monitor is a small, portable device. You can wear one while you do your normal daily activities. This is usually used to diagnose what is causing palpitations/syncope (passing out). Will be mailed to you in 3-7 days.    Follow-Up: At [facility], you and your health needs are our priority.  As part of our continuing mission to provide you with exceptional heart care, we have created designated Provider Care Teams.  These Care Teams include your primary Cardiologist (physician) and Advanced Practice Providers (APPs -  Physician Assistants and Nurse Practitioners) who all work together to provide you with the care you need, when you need it.  We recommend signing up for the patient portal called "MyChart".  Sign up information is provided on this After Visit Summary.  MyChart is used to connect with patients for Virtual Visits (Telemedicine).  Patients are able to view lab/test results, encounter notes, upcoming appointments, etc.  Non-urgent messages can be sent to your provider as well.   To learn more about what you can do with MyChart, go to [email or web address].    Your next appointment:   6 month(s):    Provider:   Reatha Harps, MD     Other Instructions  KardiaMobile [email or web address]        FDA-cleared, clinical grade mobile EKG monitor: Lourena Simmonds is the most clinically-validated mobile EKG used by the world's leading cardiac care medical professionals With Basic service, know instantly if your heart rhythm is normal or if atrial fibrillation is detected, and email the last single EKG recording to yourself or your doctor Premium service, available for purchase through the Kardia app for $9.99 per month or $99 per year, includes unlimited history and storage of your EKG recordings, a monthly EKG summary report to share with your doctor, along with the ability to track your blood pressure, activity and weight Includes one KardiaMobile phone clip FREE SHIPPING: Standard delivery 1-3 business days. Orders placed by 11:00am PST will ship that afternoon. Otherwise, will ship next business day. All orders ship via [employer] from [place], [place]    [employer] - sending an EKG Download app and set up profile. Run EKG - by placing 1-2 fingers on the silver plates After EKG is complete - Download PDF  - Skip password (if you apply a password the provider will need it to view the EKG) Click share button (square with upward arrow) in bottom left corner To send: choose MyChart (first time log into MyChart)  Pop up window about sending ECG Click continue Choose type of message Choose provider Type subject and message Click send (EKG should be attached)  -  To send additional EKGs in one message click the paperclip image and bottom of page to attach.     ZIO XT- Long Term Monitor Instructions  Your physician has requested you wear a ZIO patch monitor for 14 days.  This is a single patch monitor. [employer] supplies one patch monitor per enrollment. Additional stickers are not available. Please do not apply patch if you will be having a Nuclear Stress Test,   Echocardiogram, Cardiac CT, MRI, or Chest Xray during the period you would be wearing the  monitor. The patch cannot be worn during these tests. You cannot remove and re-apply the  ZIO XT patch monitor.  Your ZIO patch monitor will be mailed 3 day USPS to your address on file. It may take 3-5 days  to receive your monitor after you have been enrolled.  Once you have received your monitor, please review the enclosed instructions. Your monitor  has already been registered assigning a specific monitor serial # to you.  Billing and Patient Assistance Program Information  We have supplied [employer] with any of your insurance information on file for billing purposes. [employer] offers a sliding scale Patient Assistance Program for patients that do not have  insurance, or whose insurance does not completely cover the cost of the ZIO monitor.  You must apply for the Patient Assistance Program to qualify for this discounted rate.  To apply, please call [employer] at [phone], select option 4, select option 2, ask to apply for  Patient Assistance Program. Meredeth Ide will ask your household income, and how many people  are in your household. They will quote your out-of-pocket cost based on that information.  [employer] will also be able to set up a 20-month, interest-free payment plan if needed.  Applying the monitor   Shave hair from upper left chest.  Hold abrader disc by orange tab. Rub abrader in 40 strokes over the upper left chest as  indicated in your monitor instructions.  Clean area with 4 enclosed alcohol pads. Let dry.  Apply patch as indicated in monitor instructions. Patch will be placed under collarbone on left  side of chest with arrow pointing upward.  Rub patch adhesive wings for 2 minutes. Remove white label marked "1". Remove the white  label marked "2". Rub patch adhesive wings for 2 additional minutes.  While looking in a mirror, press and release button in center of patch. A small  green light will  flash 3-4 times. This will be your only indicator that the monitor has been turned on.  Do not shower for the first 24 hours. You may shower after the first 24 hours.  Press the button if you feel a symptom. You will hear a small click. Record Date, Time and  Symptom in the Patient Logbook.  When you are ready to remove the patch, follow instructions on the last 2 pages of Patient  Logbook. Stick patch monitor onto the last page of Patient Logbook.  Place Patient Logbook in the blue and white box. Use locking tab on box and tape box closed  securely. The blue and white box has prepaid postage on it. Please place it in the mailbox as  soon as possible. Your physician should have your test results approximately 7 days after the  monitor has been mailed back to [facility].  Call [facility] Customer Care at [phone] if you have questions regarding  your ZIO XT patch monitor. Call them immediately if you see an orange light blinking on your  monitor.  If your monitor falls off in less than 4 days, contact our Monitor department at [phone].  If your monitor becomes loose or falls off after 4 days call [employer] at [phone] for  suggestions on securing your monitor     Signed, Lenna Gilford. Flora Lipps, MD, [facility]  [facility]  [address] [place], [place] [address] [phone]  11/14/2022 8:51 AM

## 2022-11-14 ENCOUNTER — Encounter: Payer: Self-pay | Admitting: Cardiovascular Disease

## 2022-11-14 ENCOUNTER — Ambulatory Visit: Payer: Medicare HMO | Attending: Cardiovascular Disease | Admitting: Cardiovascular Disease

## 2022-11-14 VITALS — BP 128/70 | HR 65 | Ht 71.0 in | Wt 222.0 lb

## 2022-11-14 DIAGNOSIS — I480 Paroxysmal atrial fibrillation: Secondary | ICD-10-CM

## 2022-11-14 DIAGNOSIS — I150 Renovascular hypertension: Secondary | ICD-10-CM

## 2022-11-14 DIAGNOSIS — I6523 Occlusion and stenosis of bilateral carotid arteries: Secondary | ICD-10-CM

## 2022-11-14 DIAGNOSIS — E782 Mixed hyperlipidemia: Secondary | ICD-10-CM | POA: Diagnosis not present

## 2022-11-14 NOTE — Patient Instructions (Addendum)
Medication Instructions:  Your physician recommends that you continue on your current medications as directed. Please refer to the Current Medication list given to you today.   *If you need a refill on your cardiac medications before your next appointment, please call your pharmacy*   Lab Work: None ordered   Testing/Procedures: Your physician has recommended that you wear a holter monitor. -14 day zio Holter monitors are medical devices that record the heart's electrical activity. Doctors most often use these monitors to diagnose arrhythmias. Arrhythmias are problems with the speed or rhythm of the heartbeat. The monitor is a small, portable device. You can wear one while you do your normal daily activities. This is usually used to diagnose what is causing palpitations/syncope (passing out). Will be mailed to you in 3-7 days.    Follow-Up: At [facility], you and your health needs are our priority.  As part of our continuing mission to provide you with exceptional heart care, we have created designated Provider Care Teams.  These Care Teams include your primary Cardiologist (physician) and Advanced Practice Providers (APPs -  Physician Assistants and Nurse Practitioners) who all work together to provide you with the care you need, when you need it.  We recommend signing up for the patient portal called "MyChart".  Sign up information is provided on this After Visit Summary.  MyChart is used to connect with patients for Virtual Visits (Telemedicine).  Patients are able to view lab/test results, encounter notes, upcoming appointments, etc.  Non-urgent messages can be sent to your provider as well.   To learn more about what you can do with MyChart, go to [email or web address].    Your next appointment:   6 month(s):   Provider:   Reatha Harps, MD     Other Instructions  KardiaMobile [email or web address]        FDA-cleared, clinical grade  mobile EKG monitor: Samuel Berger is the most clinically-validated mobile EKG used by the world's leading cardiac care medical professionals With Basic service, know instantly if your heart rhythm is normal or if atrial fibrillation is detected, and email the last single EKG recording to yourself or your doctor Premium service, available for purchase through the Kardia app for $9.99 per month or $99 per year, includes unlimited history and storage of your EKG recordings, a monthly EKG summary report to share with your doctor, along with the ability to track your blood pressure, activity and weight Includes one KardiaMobile phone clip FREE SHIPPING: Standard delivery 1-3 business days. Orders placed by 11:00am PST will ship that afternoon. Otherwise, will ship next business day. All orders ship via [employer] from [place],     [employer] - sending an EKG Download app and set up profile. Run EKG - by placing 1-2 fingers on the silver plates After EKG is complete - Download PDF  - Skip password (if you apply a password the provider will need it to view the EKG) Click share button (square with upward arrow) in bottom left corner To send: choose MyChart (first time log into MyChart)  Pop up window about sending ECG Click continue Choose type of message Choose provider Type subject and message Click send (EKG should be attached)  - To send additional EKGs in one message click the paperclip image and bottom of page to attach.     ZIO XT- Long Term Monitor Instructions  Your physician has requested you wear a ZIO patch monitor for 14 days.  This is a single patch  monitor. [employer] supplies one patch monitor per enrollment. Additional stickers are not available. Please do not apply patch if you will be having a Nuclear Stress Test,  Echocardiogram, Cardiac CT, MRI, or Chest Xray during the period you would be wearing the  monitor. The patch cannot be worn during these tests. You cannot remove and  re-apply the  ZIO XT patch monitor.  Your ZIO patch monitor will be mailed 3 day USPS to your address on file. It may take 3-5 days  to receive your monitor after you have been enrolled.  Once you have received your monitor, please review the enclosed instructions. Your monitor  has already been registered assigning a specific monitor serial # to you.  Billing and Patient Assistance Program Information  We have supplied [employer] with any of your insurance information on file for billing purposes. [employer] offers a sliding scale Patient Assistance Program for patients that do not have  insurance, or whose insurance does not completely cover the cost of the ZIO monitor.  You must apply for the Patient Assistance Program to qualify for this discounted rate.  To apply, please call [employer] at [phone], select option 4, select option 2, ask to apply for  Patient Assistance Program. Samuel Berger will ask your household income, and how many people  are in your household. They will quote your out-of-pocket cost based on that information.  [employer] will also be able to set up a 51-month, interest-free payment plan if needed.  Applying the monitor   Shave hair from upper left chest.  Hold abrader disc by orange tab. Rub abrader in 40 strokes over the upper left chest as  indicated in your monitor instructions.  Clean area with 4 enclosed alcohol pads. Let dry.  Apply patch as indicated in monitor instructions. Patch will be placed under collarbone on left  side of chest with arrow pointing upward.  Rub patch adhesive wings for 2 minutes. Remove white label marked "1". Remove the white  label marked "2". Rub patch adhesive wings for 2 additional minutes.  While looking in a mirror, press and release button in center of patch. A small green light will  flash 3-4 times. This will be your only indicator that the monitor has been turned on.  Do not shower for the first 24 hours. You may shower after the  first 24 hours.  Press the button if you feel a symptom. You will hear a small click. Record Date, Time and  Symptom in the Patient Logbook.  When you are ready to remove the patch, follow instructions on the last 2 pages of Patient  Logbook. Stick patch monitor onto the last page of Patient Logbook.  Place Patient Logbook in the blue and white box. Use locking tab on box and tape box closed  securely. The blue and white box has prepaid postage on it. Please place it in the mailbox as  soon as possible. Your physician should have your test results approximately 7 days after the  monitor has been mailed back to [facility].  Call [facility] Customer Care at [phone] if you have questions regarding  your ZIO XT patch monitor. Call them immediately if you see an orange light blinking on your  monitor.  If your monitor falls off in less than 4 days, contact our Monitor department at [phone].  If your monitor becomes loose or falls off after 4 days call [employer] at [phone] for  suggestions on securing your monitor

## 2022-11-26 ENCOUNTER — Telehealth: Payer: Self-pay | Admitting: Cardiovascular Disease

## 2022-11-26 DIAGNOSIS — I480 Paroxysmal atrial fibrillation: Secondary | ICD-10-CM

## 2022-11-26 NOTE — Telephone Encounter (Signed)
LM that will call once clarified.  Did not see order, can you check to see if ordered already.  I do not want to duplicate order

## 2022-11-26 NOTE — Telephone Encounter (Signed)
Patient said that he hasn't gotten his heart monitor yet.Marland Kitchen..Marland Kitchenhe saw Dr. Flora Lipps on 8/14

## 2022-11-29 NOTE — Telephone Encounter (Signed)
Will forward this to Dr. Tresa Endo and Eliseo Gum for clarification.

## 2022-11-29 NOTE — Telephone Encounter (Signed)
   Pt is calling back to follow up. He would like to receive a call today

## 2022-11-30 NOTE — Telephone Encounter (Signed)
I am not sure.  Since he saw Dr. Lennie Odor on 8/.14, I would forward to him and then also to Burt Knack, RN who is covering for him.  It may be that the orders did not go through.  Otherwise it could be that the device is still in the mail.Bryan Lemma, MD

## 2022-12-04 ENCOUNTER — Telehealth: Payer: Self-pay

## 2022-12-04 ENCOUNTER — Ambulatory Visit: Payer: Medicare HMO | Attending: Cardiovascular Disease

## 2022-12-04 DIAGNOSIS — I480 Paroxysmal atrial fibrillation: Secondary | ICD-10-CM

## 2022-12-04 NOTE — Telephone Encounter (Signed)
he 14 day zio was never ordered. He needs this for paroxysmal atrial fibrillation. I am unsure why Dr. Tresa Endo or Dr. Herbie Baltimore were involved with this. Can we get this ordered?    Will order this today.

## 2022-12-04 NOTE — Progress Notes (Unsigned)
Enrolled for [employer] to mail a ZIO XT long term holter monitor to the patients address on file.  

## 2022-12-04 NOTE — Telephone Encounter (Signed)
Patient called stating he has not heard back regarding monitor.  Advised that it was ordered and he should have it soon.  He did complain that had taken almost 2 weeks to get this ordered.  Apologized for this delay and advised it should be arriving soon.  Also verified his address.  After talking with him he was not as upset and will look out for the monitor to arrive.

## 2022-12-08 DIAGNOSIS — I480 Paroxysmal atrial fibrillation: Secondary | ICD-10-CM | POA: Diagnosis not present

## 2022-12-26 DIAGNOSIS — I480 Paroxysmal atrial fibrillation: Secondary | ICD-10-CM | POA: Diagnosis not present

## 2022-12-31 ENCOUNTER — Telehealth: Payer: Self-pay | Admitting: Cardiovascular Disease

## 2022-12-31 NOTE — Telephone Encounter (Signed)
Patient returned RN's call regarding results. 

## 2022-12-31 NOTE — Telephone Encounter (Signed)
Spoke with pt, he reports he currently taking metoprolol succ er 50 mg once daily. It was not on his medication list, they report it was started in June when they found the a fib. Aware will let dr Flora Lipps know and will call him back if any adjustments in medications are needed. Follow up scheduled 01/30/23.

## 2022-12-31 NOTE — Telephone Encounter (Signed)
Left voicemail to return call to office.

## 2023-01-01 NOTE — Telephone Encounter (Signed)
Spoke with Samuel Berger, Aware of dr Flora Lipps recommendations. He is going to bring all his medications to his next appointment.

## 2023-01-24 DIAGNOSIS — L821 Other seborrheic keratosis: Secondary | ICD-10-CM | POA: Diagnosis not present

## 2023-01-24 DIAGNOSIS — L820 Inflamed seborrheic keratosis: Secondary | ICD-10-CM | POA: Diagnosis not present

## 2023-01-24 DIAGNOSIS — Z85828 Personal history of other malignant neoplasm of skin: Secondary | ICD-10-CM | POA: Diagnosis not present

## 2023-01-28 NOTE — Progress Notes (Unsigned)
Cardiology Office Note:  .   Date:  01/30/2023  ID:  Samuel Berger, DOB [birth year], MRN [id number] PCP: Georgann Housekeeper, MD  [facility] Providers Cardiologist:  Reatha Harps, MD { History of Present Illness: .   Samuel Berger is a 77 y.o. male with history of pAF who presents for follow-up.   Discussed the use of AI scribe software for clinical note transcription with the patient, who gave verbal consent to proceed.  History of Present Illness   Samuel Berger, a 77 year old male with a history of paroxysmal atrial fibrillation, carotid artery disease, hypertension, and hyperlipidemia, presents for a follow-up visit. The patient had a recent fall while helping his daughter move in [place], which led to a hospital visit. During the hospital visit, the patient was found to be in atrial fibrillation. However, the patient is currently not in atrial fibrillation. A recent monitor shows a 30% afib burden, indicating that the patient's atrial fibrillation is coming and going. Despite this, the patient reports no symptoms of atrial fibrillation such as heart racing.  The patient is currently on Eliquis, a blood thinner, due to the risk of stroke associated with atrial fibrillation. The patient has previously had surgery for stroke from the neck, and was previously on a different blood thinner, Plavix. The patient is also on metoprolol, which is intended to slow the heart down and potentially return it to normal rhythm if the patient goes into atrial fibrillation. However, the patient reports experiencing fatigue and low energy, which he attributes to the metoprolol.  The patient also has sleep apnea, which his wife reports causes him to stop breathing during sleep. The patient has previously had a sleep study and was prescribed a CPAP machine, but found it intolerable. The patient is not currently interested in pursuing further treatment for sleep apnea.          Problem List Carotid  artery disease/CVA -R CEA 2013 2. R common iliac artery aneurysm  3. Paroxysmal Afib -Dx 09/2022 in setting of fall -spontaneous conversion to NSR -CHADSVASC=7 (age, CVA, PAD, HTN, DM) -30% burden 12/2022 4. Lymphoma (remission) 5. HTN 6. HLD -T chol 157, HDL 55, LDL 85, TG 88 7. DM -A1c 7.0 8. RBBB 9. OSA    ROS: All other ROS reviewed and negative. Pertinent positives noted in the HPI.     Studies Reviewed: Marland Kitchen        TTE 09/29/2022 1.  Left ventricle: The cavity size is normal. Systolic function is normal. The estimated ejection fraction is 55-60%. Wall motion is normal; there are no regional wall motion abnormalities. Normal diastolic function.  2.  Aortic valve: There is thickening, consistent with sclerosis.  3.  Right ventricle: The cavity size is normal. Systolic function is normal.  Physical Exam:   VS:  BP 130/62 (BP Location: Left Arm, Patient Position: Sitting, Cuff Size: Normal)   Pulse 66   Ht 5\' 11"  (1.803 m)   Wt 230 lb (104.3 kg)   SpO2 96%   BMI 32.08 kg/m    Wt Readings from Last 3 Encounters:  01/30/23 230 lb (104.3 kg)  11/14/22 222 lb (100.7 kg)  08/10/22 223 lb 12.8 oz (101.5 kg)    GEN: Well nourished, well developed in no acute distress NECK: No JVD; No carotid bruits CARDIAC: RRR, no murmurs, rubs, gallops RESPIRATORY:  Clear to auscultation without rales, wheezing or rhonchi  ABDOMEN: Soft, non-tender, non-distended EXTREMITIES:  No edema; No deformity  ASSESSMENT AND  PLAN: .   Assessment and Plan    Paroxysmal Atrial Fibrillation Asymptomatic with 30% burden on recent monitor. Currently on Eliquis for stroke prophylaxis. -Continue Eliquis 5mg  BID. -Reduce Metoprolol to 25mg  daily at night.  Sleep Apnea Reports daytime fatigue and history of snoring with apneas noted by spouse. Unwilling to consider CPAP therapy due to past negative experience. -Refer to Pulmonary for re-evaluation of sleep apnea and discussion of current treatment  options.  Hypertension Well controlled on current regimen. -Continue Lisinopril 5mg  daily and Amlodipine 10mg  daily.  Peripheral Artery Disease Status post right carotid endarterectomy in 2013. -Continue current regimen and reevaluate at next visit.  Hyperlipidemia LDL 85 on current regimen. -Continue Lipitor 40mg  daily.  Diabetes A1C 7, managed by primary care physician. -Continue current management with primary care physician.  Follow-up in 6 months or sooner if any changes.              Follow-up: Return in about 6 months (around 07/31/2023).  Time Spent with Patient: I have spent a total of 35 minutes caring for this patient today face to face, ordering and reviewing labs/tests, reviewing prior records/medical history, examining the patient, establishing an assessment and plan, communicating results/findings to the patient/family, and documenting in the medical record.   Signed, Lenna Gilford. Flora Lipps, MD, [facility]  [facility]  [address], [address] [place], [place] [address] [phone]  9:32 AM

## 2023-01-30 ENCOUNTER — Encounter: Payer: Self-pay | Admitting: Cardiovascular Disease

## 2023-01-30 ENCOUNTER — Ambulatory Visit: Payer: Medicare HMO | Attending: Cardiovascular Disease | Admitting: Cardiovascular Disease

## 2023-01-30 VITALS — BP 130/62 | HR 66 | Ht 71.0 in | Wt 230.0 lb

## 2023-01-30 DIAGNOSIS — I150 Renovascular hypertension: Secondary | ICD-10-CM | POA: Diagnosis not present

## 2023-01-30 DIAGNOSIS — I480 Paroxysmal atrial fibrillation: Secondary | ICD-10-CM | POA: Diagnosis not present

## 2023-01-30 DIAGNOSIS — I6523 Occlusion and stenosis of bilateral carotid arteries: Secondary | ICD-10-CM | POA: Diagnosis not present

## 2023-01-30 DIAGNOSIS — G4733 Obstructive sleep apnea (adult) (pediatric): Secondary | ICD-10-CM

## 2023-01-30 DIAGNOSIS — I739 Peripheral vascular disease, unspecified: Secondary | ICD-10-CM | POA: Diagnosis not present

## 2023-01-30 DIAGNOSIS — E782 Mixed hyperlipidemia: Secondary | ICD-10-CM

## 2023-01-30 MED ORDER — METOPROLOL SUCCINATE ER 25 MG PO TB24: ORAL_TABLET | ORAL | 3 refills | Status: AC

## 2023-01-30 NOTE — Patient Instructions (Signed)
Medication Instructions:  - Take METOPROLOL 25mg  daily at night    *If you need a refill on your cardiac medications before your next appointment, please call your pharmacy*   Lab Work: None    If you have labs (blood work) drawn today and your tests are completely normal, you will receive your results only by: MyChart Message (if you have MyChart) OR A paper copy in the mail If you have any lab test that is abnormal or we need to change your treatment, we will call you to review the results.   Testing/Procedures: None    Follow-Up: At [facility], you and your health needs are our priority.  As part of our continuing mission to provide you with exceptional heart care, we have created designated Provider Care Teams.  These Care Teams include your primary Cardiologist (physician) and Advanced Practice Providers (APPs -  Physician Assistants and Nurse Practitioners) who all work together to provide you with the care you need, when you need it.  We recommend signing up for the patient portal called "MyChart".  Sign up information is provided on this After Visit Summary.  MyChart is used to connect with patients for Virtual Visits (Telemedicine).  Patients are able to view lab/test results, encounter notes, upcoming appointments, etc.  Non-urgent messages can be sent to your provider as well.   To learn more about what you can do with MyChart, go to [email or web address].    Your next appointment:   6 month(s)  The format for your next appointment:   In Person  Provider:   Reatha Harps, MD    Other Instructions Dr. Scharlene Gloss has referred you to the pulmonology clinic

## 2023-04-09 DIAGNOSIS — I723 Aneurysm of iliac artery: Secondary | ICD-10-CM | POA: Diagnosis not present

## 2023-04-09 DIAGNOSIS — J449 Chronic obstructive pulmonary disease, unspecified: Secondary | ICD-10-CM | POA: Diagnosis not present

## 2023-04-09 DIAGNOSIS — I4891 Unspecified atrial fibrillation: Secondary | ICD-10-CM | POA: Diagnosis not present

## 2023-04-09 DIAGNOSIS — Z1331 Encounter for screening for depression: Secondary | ICD-10-CM | POA: Diagnosis not present

## 2023-04-09 DIAGNOSIS — N400 Enlarged prostate without lower urinary tract symptoms: Secondary | ICD-10-CM | POA: Diagnosis not present

## 2023-04-09 DIAGNOSIS — C8510 Unspecified B-cell lymphoma, unspecified site: Secondary | ICD-10-CM | POA: Diagnosis not present

## 2023-04-09 DIAGNOSIS — I700 Atherosclerosis of aorta: Secondary | ICD-10-CM | POA: Diagnosis not present

## 2023-04-09 DIAGNOSIS — E7800 Pure hypercholesterolemia, unspecified: Secondary | ICD-10-CM | POA: Diagnosis not present

## 2023-04-09 DIAGNOSIS — E1151 Type 2 diabetes mellitus with diabetic peripheral angiopathy without gangrene: Secondary | ICD-10-CM | POA: Diagnosis not present

## 2023-04-09 DIAGNOSIS — I739 Peripheral vascular disease, unspecified: Secondary | ICD-10-CM | POA: Diagnosis not present

## 2023-04-09 DIAGNOSIS — I10 Essential (primary) hypertension: Secondary | ICD-10-CM | POA: Diagnosis not present

## 2023-04-09 DIAGNOSIS — Z0000 Encounter for general adult medical examination without abnormal findings: Secondary | ICD-10-CM | POA: Diagnosis not present

## 2023-05-31 DIAGNOSIS — J209 Acute bronchitis, unspecified: Secondary | ICD-10-CM | POA: Diagnosis not present

## 2023-06-17 DIAGNOSIS — Z96612 Presence of left artificial shoulder joint: Secondary | ICD-10-CM | POA: Diagnosis not present

## 2023-06-17 DIAGNOSIS — M25511 Pain in right shoulder: Secondary | ICD-10-CM | POA: Diagnosis not present

## 2023-07-01 ENCOUNTER — Ambulatory Visit (INDEPENDENT_AMBULATORY_CARE_PROVIDER_SITE_OTHER): Payer: Medicare HMO | Admitting: Primary Care

## 2023-07-01 ENCOUNTER — Encounter: Payer: Self-pay | Admitting: Primary Care

## 2023-07-01 VITALS — BP 134/85 | HR 99 | Temp 97.3°F | Ht 71.0 in | Wt 224.4 lb

## 2023-07-01 DIAGNOSIS — G4730 Sleep apnea, unspecified: Secondary | ICD-10-CM | POA: Diagnosis not present

## 2023-07-01 DIAGNOSIS — Z91198 Patient's noncompliance with other medical treatment and regimen for other reason: Secondary | ICD-10-CM | POA: Diagnosis not present

## 2023-07-01 DIAGNOSIS — Z7901 Long term (current) use of anticoagulants: Secondary | ICD-10-CM

## 2023-07-01 DIAGNOSIS — I480 Paroxysmal atrial fibrillation: Secondary | ICD-10-CM | POA: Diagnosis not present

## 2023-07-01 DIAGNOSIS — Z8669 Personal history of other diseases of the nervous system and sense organs: Secondary | ICD-10-CM

## 2023-07-01 NOTE — Progress Notes (Signed)
 @Patient  ID: Samuel Berger, male    DOB: 1945-12-18, 78 y.o.   MRN: [id number]  Chief Complaint  Patient presents with   Follow-up    Referring provider: Sande Rives, *  HPI: 78 year old male, former smoker. PMH hypertension, AFIB, coronary artery stenosis, aneurysm of iliac artery, generalized ischemic supravascular disease, peripheral artery disease, COPD, allergic rhinitis, type 2 diabetes, diffuse large B-cell lymphoma, obesity.  07/01/2023 Discussed the use of AI scribe software for clinical note transcription with the patient, who gave verbal consent to proceed.  Samuel Berger is a 78 year old male with paroxysmal atrial fibrillation who presents for evaluation of potential sleep apnea. He was referred by Dr. Flora Lipps, a cardiologist, for evaluation of potential sleep apnea.  He has a history of a sleep study conducted 8 to 10 years ago, which resulted in a CPAP prescription. However, he found the device uncomfortable and did not continue its use, describing it as 'the old fashioned type' with a 'big mask' that was 'awful'.  His current sleep pattern includes going to bed between 9 and 10 PM, taking about 5 minutes to fall asleep, waking up 2 to 3 times per night, and starting his day between 7 and 7:30 AM. He denies daytime fatigue or falling asleep easily during the day. He wakes up to urinate 3 to 4 times a night but denies restless sleep or tossing and turning. He does not have dentures.  He has paroxysmal atrial fibrillation, which is asymptomatic. He is currently on a blood thinner and metoprolol for heart rate management. No dizziness, lightheadedness, or shortness of breath.      Allergies  Allergen Reactions   Bee Venom Hives   Sulfa Drugs Cross Reactors Hives    Immunization History  Administered Date(s) Administered   PFIZER(Purple Top)SARS-COV-2 Vaccination 05/08/2019, 06/03/2019    Past Medical History:  Diagnosis Date   Aneurysm artery, iliac  (HCC)    Arrhythmia    Carotid artery occlusion    Dyslipidemia    GERD (gastroesophageal reflux disease)    Hepatitis    ? age 22 or 84   Hyperlipidemia    Hypertension    Iliac artery aneurysm, right (HCC)    large b cell lymphoma dx'd 02/2016   Stroke (HCC)    01-05-2012   02-2012   Type II or unspecified type diabetes mellitus without mention of complication, not stated as uncontrolled     Tobacco History: Social History   Tobacco Use  Smoking Status Former   Current packs/day: 0.00   Average packs/day: 0.5 packs/day for 30.0 years (15.0 ttl pk-yrs)   Types: Cigars, Cigarettes   Start date: 01/04/1982   Quit date: 01/05/2012   Years since quitting: 11.4  Smokeless Tobacco Former   Counseling given: Not Answered   Outpatient Medications Prior to Visit  Medication Sig Dispense Refill   amLODipine (NORVASC) 10 MG tablet Take 10 mg by mouth every morning.      atorvastatin (LIPITOR) 40 MG tablet Take 40 mg by mouth at bedtime.     Cholecalciferol (VITAMIN D3) 25 MCG TABS Take 1,000 mcg by mouth daily.     ELIQUIS 5 MG TABS tablet Take 5 mg by mouth 2 (two) times daily.     lisinopril (PRINIVIL,ZESTRIL) 5 MG tablet Take 5 mg by mouth daily.     MAGNESIUM OXIDE PO Take 200 mg by mouth 2 (two) times daily.     metFORMIN (GLUCOPHAGE-XR) 500 MG 24 hr tablet Take  500 mg by mouth 2 (two) times daily with a meal.     metoprolol succinate (TOPROL XL) 25 MG 24 hr tablet Take 1 tablet (25mg  ) daily at night. 90 tablet 3   TRELEGY ELLIPTA 100-62.5-25 MCG/ACT AEPB Inhale 1 puff into the lungs daily.     clopidogrel (PLAVIX) 75 MG tablet Take 75 mg by mouth daily. (Patient not taking: Reported on 01/30/2023)     No facility-administered medications prior to visit.   Review of Systems  Review of Systems  Constitutional: Negative.  Negative for fatigue.  Respiratory: Negative.  Negative for shortness of breath.   Cardiovascular: Negative.   Neurological: Negative.  Negative for  dizziness and light-headedness.   Physical Exam  BP 134/85 (BP Location: Left Arm, Patient Position: Sitting, Cuff Size: Large)   Pulse 99   Temp (!) 97.3 F (36.3 C) (Temporal)   Ht 5\' 11"  (1.803 m)   Wt 224 lb 6.4 oz (101.8 kg)   SpO2 95%   BMI 31.30 kg/m  Physical Exam Constitutional:      Appearance: Normal appearance.  HENT:     Head: Normocephalic and atraumatic.  Cardiovascular:     Rate and Rhythm: Normal rate. Rhythm irregular.  Pulmonary:     Effort: Pulmonary effort is normal.     Breath sounds: Normal breath sounds.  Musculoskeletal:        General: Normal range of motion.  Skin:    General: Skin is warm and dry.  Neurological:     General: No focal deficit present.     Mental Status: He is alert and oriented to person, place, and time. Mental status is at baseline.  Psychiatric:        Mood and Affect: Mood normal.        Behavior: Behavior normal.        Thought Content: Thought content normal.        Judgment: Judgment normal.      Lab Results:  CBC    Component Value Date/Time   WBC 9.8 08/10/2022 0918   WBC 7.6 12/15/2021 0945   RBC 5.16 08/10/2022 0918   HGB 15.9 08/10/2022 0918   HGB 13.4 12/18/2016 1403   HCT 46.4 08/10/2022 0918   HCT 39.7 12/18/2016 1403   PLT 179 08/10/2022 0918   PLT 168 12/18/2016 1403   MCV 89.9 08/10/2022 0918   MCV 92.1 12/18/2016 1403   MCH 30.8 08/10/2022 0918   MCHC 34.3 08/10/2022 0918   RDW 13.6 08/10/2022 0918   RDW 15.6 (H) 12/18/2016 1403   LYMPHSABS 2.0 08/10/2022 0918   LYMPHSABS 1.4 12/18/2016 1403   MONOABS 1.4 (H) 08/10/2022 0918   MONOABS 1.3 (H) 12/18/2016 1403   EOSABS 0.5 08/10/2022 0918   EOSABS 1.2 (H) 12/18/2016 1403   BASOSABS 0.1 08/10/2022 0918   BASOSABS 0.1 12/18/2016 1403    BMET    Component Value Date/Time   NA 138 08/10/2022 0918   NA 141 12/18/2016 1403   K 4.5 08/10/2022 0918   K 4.4 12/18/2016 1403   CL 101 08/10/2022 0918   CO2 29 08/10/2022 0918   CO2 28  12/18/2016 1403   GLUCOSE 145 (H) 08/10/2022 0918   GLUCOSE 125 12/18/2016 1403   BUN 19 08/10/2022 0918   BUN 19.3 12/18/2016 1403   CREATININE 0.87 08/10/2022 0918   CREATININE 1.0 12/18/2016 1403   CALCIUM 9.1 08/10/2022 0918   CALCIUM 9.6 12/18/2016 1403   GFRNONAA >60 08/10/2022 1610  GFRAA >60 08/05/2019 1146   GFRAA >60 07/28/2018 1057    BNP No results found for: "BNP"  ProBNP No results found for: "PROBNP"  Imaging: No results found.   Assessment & Plan:   No problem-specific Assessment & Plan notes found for this encounter.  Assessment and Plan    Sleep Apnea Diagnosed 8 to 10 years ago, previously prescribed CPAP but non-compliant due to discomfort. Referred for reassessment due to potential contribution to atrial fibrillation. Risks include cardiac arrhythmias, stroke, pulmonary hypertension, diabetes, and potential link to Alzheimer's disease. Reports nocturnal awakenings 2 to 3 times, denies daytime fatigue or excessive sleepiness. Newer CPAP machines may improve compliance. Alternatives include oral appliances for mild cases and surgical options, though less common. - Order home sleep study to reassess for sleep apnea - Discuss CPAP options if moderate or severe sleep apnea is confirmed - Consider oral appliance if mild sleep apnea is confirmed - Discuss potential referral to ENT for surgical options if necessary  Atrial Fibrillation Paroxysmal atrial fibrillation, currently asymptomatic. On anticoagulant and metoprolol for rate control. Suspected contribution from sleep apnea. No dizziness, lightheadedness, or dyspnea reported. - Continue current medications for atrial fibrillation including Eliquis and Metoprolol.       Glenford Bayley, NP 07/01/2023

## 2023-07-01 NOTE — Patient Instructions (Signed)
 -  SLEEP APNEA: Sleep apnea is a condition where your breathing repeatedly stops and starts during sleep. It can increase the risk of heart problems, stroke, and other health issues. We will order a home sleep study to reassess your condition. If moderate or severe sleep apnea is confirmed, we will discuss newer CPAP options that may be more comfortable. If mild sleep apnea is confirmed, we may consider an oral appliance. If necessary, we can also discuss a referral to an ENT specialist for potential surgical options.  -ATRIAL FIBRILLATION: Atrial fibrillation is an irregular and often rapid heart rate that can increase the risk of strokes, heart failure, and other heart-related complications. You are currently asymptomatic and on medications to manage this condition. We will continue your current medications, including the blood thinner and metoprolol, for heart rate control.  INSTRUCTIONS:  We will arrange for a home sleep study to reassess your sleep apnea. Please follow up with [place] once the study is completed to discuss the results and next steps.  Follow-up Contact our office 2-3 weeks after completing sleep study for results/treatment

## 2023-07-03 DIAGNOSIS — M25611 Stiffness of right shoulder, not elsewhere classified: Secondary | ICD-10-CM | POA: Diagnosis not present

## 2023-07-03 DIAGNOSIS — M25511 Pain in right shoulder: Secondary | ICD-10-CM | POA: Diagnosis not present

## 2023-07-12 ENCOUNTER — Encounter

## 2023-07-12 DIAGNOSIS — I480 Paroxysmal atrial fibrillation: Secondary | ICD-10-CM

## 2023-07-12 DIAGNOSIS — Z8669 Personal history of other diseases of the nervous system and sense organs: Secondary | ICD-10-CM

## 2023-07-12 DIAGNOSIS — G4730 Sleep apnea, unspecified: Secondary | ICD-10-CM | POA: Diagnosis not present

## 2023-07-16 DIAGNOSIS — M25511 Pain in right shoulder: Secondary | ICD-10-CM | POA: Diagnosis not present

## 2023-07-16 DIAGNOSIS — M25611 Stiffness of right shoulder, not elsewhere classified: Secondary | ICD-10-CM | POA: Diagnosis not present

## 2023-07-22 NOTE — Progress Notes (Unsigned)
 Cardiology Office Note    Date:  07/29/2023  ID:  Samuel Berger, Days Dec 05, 1945, MRN [id number] PCP:  [name], MD  Cardiologist:  Oneil Bigness, MD  Electrophysiologist:  None   Chief Complaint: Follow up for atrial fibrillation   History of Present Illness: .    Samuel Berger is a 78 y.o. male with visit-pertinent history of paroxysmal atrial fibrillation, carotid artery disease, hypertension and hyperlipidemia.  In June 2024 patient had a fall while in [place] and suffered left posterior rib fractures, patient was found to be in A-fib with RVR but converted back to sinus rhythm prior to discharge.  Echocardiogram was normal.  He was started on Eliquis.  He established care with Dr. Lucy Sack on 11/14/2022.  Patient noted that he was overall unaware of atrial fibrillation.  At visit patient was in sinus rhythm.  Recommend patient wear a 14-day ZIO.  Cardiac monitor indicated patient had a 30% A-fib burden, he was started on metoprolol  succinate 25 mg daily.  Patient was last seen in clinic on 01/23/2023, patient reported having some fatigue and low energy which she attributed to metoprolol , his metoprolol  was decreased to 25 mg nightly.  Today he presents for follow up. He reports that he is overall doing well. Over a month ago he notes an episode where he woke up and was dizzy, he drank some water and laid back down, one hour later was back to his normal. He denies any recurrence.  Patient denies any chest pain, shortness of breath, lower extremity edema, orthopnea or PND. Notes he is not exercising regularly as a result of shoulder pain, usually plays golf but has been unable to. Currently working with physical therapy and has noted some improvement, reports that he tolerates physical therapy well.  Labwork independently reviewed: 04/09/2023: Hemoglobin 15.3, potassium 4.3, creatinine 0.87 ROS: .   Today he denies chest pain, shortness of breath, lower extremity edema, fatigue,  palpitations, melena, hematuria, hemoptysis, diaphoresis, weakness, presyncope, syncope, orthopnea, and PND.  All other systems are reviewed and otherwise negative. Studies Reviewed: Aaron Aas   EKG:  EKG is ordered today, personally reviewed, demonstrating  EKG Interpretation Date/Time:  Monday July 29 2023 10:45:53 EDT Ventricular Rate:  80 PR Interval:  156 QRS Duration:  114 QT Interval:  416 QTC Calculation: 479 R Axis:   19  Text Interpretation: Sinus rhythm with Premature atrial complexes RSR' or QR pattern in V1 suggests right ventricular conduction delay When compared with ECG of 14-Nov-2022 08:15, Premature atrial complexes are now Present Confirmed by Kahlani Graber [phone]) on 07/29/2023 10:49:54 AM   CV Studies: Cardiac studies reviewed are outlined and summarized above. Otherwise please see EMR for full report. Cardiac Studies & Procedures   ______________________________________________________________________________________________     ECHOCARDIOGRAM  ECHOCARDIOGRAM COMPLETE 01/31/2016  Narrative *[facility]* [address] [place], [place] [address] [phone]  ------------------------------------------------------------------- Transthoracic Echocardiography  Patient:    Samuel, Berger MR #:       [id number] Study Date: 01/31/2016 Gender:     M Age:        30 Height:     182.9 cm Weight:     92.7 kg BSA:        2.19 m^2 Pt. Status: Room:  ATTENDING    Gaylyn Keas, MD Sabra Cramp, Lesia Raring [id number] Marja Sierra, Deborra Falter REFERRING    Loleta Ripa SONOGRAPHER  Juventino Oppenheim, RCS PERFORMING   [facility],  Outpatient  cc:  ------------------------------------------------------------------- LV EF: 60% -   65%  ------------------------------------------------------------------- Indications:      SOB (R06.00).  ------------------------------------------------------------------- History:   PMH:  HLD.  Stroke.  Risk factors:  Former  tobacco use. Hypertension. Diabetes mellitus.  ------------------------------------------------------------------- Study Conclusions  - Left ventricle: The cavity size was normal. Systolic function was normal. The estimated ejection fraction was in the range of 60% to 65%. Wall motion was normal; there were no regional wall motion abnormalities. There was an increased relative contribution of atrial contraction to ventricular filling. Doppler parameters are consistent with abnormal left ventricular relaxation (grade 1 diastolic dysfunction). - Aortic valve: Trileaflet; mildly thickened, mildly calcified leaflets. - Tricuspid valve: There was trivial regurgitation.  ------------------------------------------------------------------- Study data:  Comparison was made to the study of 01/06/2012.  Study status:  Routine.  Procedure:  The patient reported no pain pre or post test. Transthoracic echocardiography. Image quality was adequate.          Transthoracic echocardiography.  M-mode, complete 2D, spectral Doppler, and color Doppler.  Birthdate: Patient birthdate: 06-Dec-1945.  Age:  Patient is 78 yr old.  Sex: Gender: male.    BMI: 27.7 kg/m^2.  Blood pressure:     136/93 Patient status:  Outpatient.  Study date:  Study date: 01/31/2016. Study time: 09:46 AM.  Location:  [facility]  -------------------------------------------------------------------  ------------------------------------------------------------------- Left ventricle:  The cavity size was normal. Systolic function was normal. The estimated ejection fraction was in the range of 60% to 65%. Wall motion was normal; there were no regional wall motion abnormalities. There was an increased relative contribution of atrial contraction to ventricular filling. Doppler parameters are consistent with abnormal left ventricular relaxation (grade 1 diastolic  dysfunction).  ------------------------------------------------------------------- Aortic valve:   Trileaflet; mildly thickened, mildly calcified leaflets. Mobility was not restricted.  Doppler:  Transvalvular velocity was within the normal range. There was no stenosis. There was no regurgitation.  ------------------------------------------------------------------- Aorta:  Aortic root: The aortic root was normal in size.  ------------------------------------------------------------------- Mitral valve:   Structurally normal valve.   Mobility was not restricted.  Doppler:  Transvalvular velocity was within the normal range. There was no evidence for stenosis. There was no regurgitation.  ------------------------------------------------------------------- Left atrium:  The atrium was normal in size.  ------------------------------------------------------------------- Right ventricle:  The cavity size was normal. Wall thickness was normal. Systolic function was normal.  ------------------------------------------------------------------- Pulmonic valve:    Structurally normal valve.   Cusp separation was normal.  Doppler:  Transvalvular velocity was within the normal range. There was no evidence for stenosis. There was no regurgitation.  ------------------------------------------------------------------- Tricuspid valve:   Structurally normal valve.    Doppler: Transvalvular velocity was within the normal range. There was trivial regurgitation.  ------------------------------------------------------------------- Pulmonary artery:   The main pulmonary artery was normal-sized. Systolic pressure was within the normal range.  ------------------------------------------------------------------- Right atrium:  The atrium was normal in size.  ------------------------------------------------------------------- Pericardium:  There was no pericardial  effusion.  ------------------------------------------------------------------- Systemic veins: Inferior vena cava: The vessel was normal in size. The respirophasic diameter changes were in the normal range (= 50%), consistent with normal central venous pressure. Diameter: 10 mm.  ------------------------------------------------------------------- Measurements  IVC                                         Value        Reference ID  10    mm     ---------  Left ventricle                              Value        Reference LV ID, ED, PLAX chordal             (L)     33.3  mm     43 - 52 LV ID, ES, PLAX chordal                     24.3  mm     23 - 38 LV fx shortening, PLAX chordal      (L)     27    %      >=29 LV PW thickness, ED                         11.8  mm     --------- IVS/LV PW ratio, ED                         0.86         <=1.3 Stroke volume, 2D                           64    ml     --------- Stroke volume/bsa, 2D                       29    ml/m^2 --------- LV ejection fraction, 1-p A4C               62    %      --------- LV e&', lateral                              10.7  cm/s   --------- LV E/e&', lateral                            6.6          --------- LV e&', medial                               4.83  cm/s   --------- LV E/e&', medial                             14.62        --------- LV e&', average                              7.77  cm/s   --------- LV E/e&', average                            9.09         ---------  Ventricular septum                          Value        Reference IVS thickness, ED  10.2  mm     ---------  LVOT                                        Value        Reference LVOT ID, S                                  20    mm     --------- LVOT area                                   3.14  cm^2   --------- LVOT peak velocity, S                       91    cm/s   --------- LVOT mean  velocity, S                       60.2  cm/s   --------- LVOT VTI, S                                 20.3  cm     ---------  Aorta                                       Value        Reference Aortic root ID, ED                          33    mm     ---------  Left atrium                                 Value        Reference LA ID, A-P, ES                              27    mm     --------- LA ID/bsa, A-P                              1.24  cm/m^2 <=2.2 LA volume, S                                35    ml     --------- LA volume/bsa, S                            16    ml/m^2 --------- LA volume, ES, 1-p A4C                      27    ml     --------- LA volume/bsa, ES, 1-p A4C  12.4  ml/m^2 --------- LA volume, ES, 1-p A2C                      41    ml     --------- LA volume/bsa, ES, 1-p A2C                  18.8  ml/m^2 ---------  Mitral valve                                Value        Reference Mitral E-wave peak velocity                 70.6  cm/s   --------- Mitral A-wave peak velocity                 98.7  cm/s   --------- Mitral deceleration time            (L)     137   ms     150 - 230 Mitral E/A ratio, peak                      0.7          ---------  Pulmonary arteries                          Value        Reference PA pressure, S, DP                          15    mm Hg  <=30  Tricuspid valve                             Value        Reference Tricuspid regurg peak velocity              172   cm/s   --------- Tricuspid peak RV-RA gradient               12    mm Hg  --------- Tricuspid maximal regurg velocity,          172   cm/s   --------- PISA  Systemic veins                              Value        Reference Estimated CVP                               3     mm Hg  ---------  Right ventricle                             Value        Reference RV pressure, S, DP                          15    mm Hg  <=30 RV s&', lateral, S  13.8   cm/s   ---------  Legend: (L)  and  (H)  mark values outside specified reference range.  ------------------------------------------------------------------- Prepared and Electronically Authenticated by  Gaylyn Keas, MD 2017-10-31T10:44:42    MONITORS  LONG TERM MONITOR (3-14 DAYS) 12/26/2022  Narrative Patch Wear Time:  13 days and 18 hours (2024-09-07T12:16:00-0400 to 2024-09-21T06:37:18-0400)  Patient had a min HR of 47 bpm (sinus bradycardia), max HR of 151 bpm (atrial fibrillation), and avg HR of 74 bpm (normal sinus rhythm). Predominant underlying rhythm was Sinus Rhythm. 3 Supraventricular Tachycardia runs occurred, the run with the fastest interval lasting 11 beats (5.6 second duration) with a max rate of 132 bpm (avg 121 bpm); the run with the fastest interval was also the longest. Atrial Fibrillation occurred (30% burden), ranging from 61-151 bpm (avg of 91 bpm), the longest lasting 17 hours 57 mins with an avg rate of 87 bpm. Isolated SVEs were rare (<1.0%), SVE Couplets were rare (<1.0%), and SVE Triplets were rare (<1.0%). Isolated VEs were rare (<1.0%), and no VE Couplets or VE Triplets were present.  Impression: Paroxysmal atrial fibrillation (30% burden). Brief SVT (3 episodes in 14 days; longest duration 5.6 seconds). Rare ectopy.  Melodee Spruce T. Rolm Clos, MD, [facility]  [facility] [address], [address] [place], [place] [address] [phone] 4:33 PM       ______________________________________________________________________________________________      Current Reported Medications:.    No outpatient medications have been marked as taking for the 07/29/23 encounter (Office Visit) with Amillion Macchia D, NP.    Physical Exam:    VS:  BP 126/66   Pulse 80   Ht 5\' 11"  (1.803 m)   Wt 225 lb (102.1 kg)   SpO2 97%   BMI 31.38 kg/m    Wt Readings from Last 3 Encounters:  07/29/23 225 lb (102.1 kg)  07/01/23 224 lb 6.4 oz (101.8 kg)  01/30/23 230  lb (104.3 kg)    GEN: Well nourished, well developed in no acute distress NECK: No JVD; No carotid bruits CARDIAC: RRR, no murmurs, rubs, gallops RESPIRATORY:  Clear to auscultation without rales, wheezing or rhonchi  ABDOMEN: Soft, non-tender, non-distended EXTREMITIES:  No edema; No acute deformity     Asessement and Plan:.    Paroxysmal atrial fibrillation: Patient noted to have 30% A-fib burden on cardiac monitor last year.  Today he reports he is doing well, denies feelings of palpitations.  Patient noted 1 episode of dizziness upon awakening over a month ago, drank some water and laid back down, resolved within an hour.  He denies any recurrence, he will continue to monitor for repeated symptoms and notify the office if present.  Patient denies any bleeding problems on Eliquis.  He is tolerating metoprolol  succinate 25 mg nightly well.  He reports that his PCP checks lab work every 6 months. CHA2DS2-VASc Score = 7 [CHF History: 0, HTN History: 1, Diabetes History: 1, Stroke History: 2, Vascular Disease History: 1, Age Score: 2, Gender Score: 0].  Therefore, the patient's annual risk of stroke is 11.2 %.  Continue Eliquis 5 mg twice daily and metoprolol  succinate 25 mg nightly.  Bilateral carotid artery stenosis: s/p right CEA in 2013. Monitored by [facility], carotid duplex scheduled for 08/07/23.   Hyperlipidemia: Last lipid profile on 04/09/2023 indicated total cholesterol 135, triglycerides 181, HDL 50 and LDL 56. Continue atorvastatin  40 mg daily.  Hypertension: Blood pressure today 124/54.  Sleep apnea: Patient noted to have a moderate obstructive sleep apnea  with moderate oxygen desaturations, recommended for CPAP therapy.  Monitor managed per pulmonology.   Disposition: F/u with Dr. Rolm Clos in six months or sooner if needed.   Signed, Kayshaun Polanco D Laketha Leopard, NP

## 2023-07-28 ENCOUNTER — Telehealth: Payer: Self-pay | Admitting: Pulmonary Disease

## 2023-07-28 DIAGNOSIS — G4733 Obstructive sleep apnea (adult) (pediatric): Secondary | ICD-10-CM

## 2023-07-28 NOTE — Telephone Encounter (Signed)
 Call patient  Sleep study result  Date of study: 07/12/2023  Impression: Moderate obstructive sleep apnea with moderate oxygen desaturations  Recommendation: DME referral  Recommend CPAP therapy for moderate obstructive sleep apnea  Auto titrating CPAP with pressure settings of 5-15 will be appropriate  Encourage weight loss measures  Other options of treatment of sleep disordered breathing may also be considered including surgical options  Follow-up in the office 4 to 6 weeks following initiation of treatment

## 2023-07-29 ENCOUNTER — Ambulatory Visit: Payer: Medicare HMO | Attending: Cardiology | Admitting: Cardiology

## 2023-07-29 ENCOUNTER — Encounter: Payer: Self-pay | Admitting: Cardiology

## 2023-07-29 VITALS — BP 126/66 | HR 80 | Ht 71.0 in | Wt 225.0 lb

## 2023-07-29 DIAGNOSIS — I150 Renovascular hypertension: Secondary | ICD-10-CM

## 2023-07-29 DIAGNOSIS — I480 Paroxysmal atrial fibrillation: Secondary | ICD-10-CM | POA: Diagnosis not present

## 2023-07-29 DIAGNOSIS — I6523 Occlusion and stenosis of bilateral carotid arteries: Secondary | ICD-10-CM | POA: Diagnosis not present

## 2023-07-29 DIAGNOSIS — E782 Mixed hyperlipidemia: Secondary | ICD-10-CM | POA: Diagnosis not present

## 2023-07-29 DIAGNOSIS — G4733 Obstructive sleep apnea (adult) (pediatric): Secondary | ICD-10-CM

## 2023-07-29 NOTE — Patient Instructions (Addendum)
 Medication Instructions:  No changes *If you need a refill on your cardiac medications before your next appointment, please call your pharmacy*  Lab Work: No labs If you have labs (blood work) drawn today and your tests are completely normal, you will receive your results only by: MyChart Message (if you have MyChart) OR A paper copy in the mail If you have any lab test that is abnormal or we need to change your treatment, we will call you to review the results.  Testing/Procedures: No testing  Follow-Up: At [facility], you and your health needs are our priority.  As part of our continuing mission to provide you with exceptional heart care, our providers are all part of one team.  This team includes your primary Cardiologist (physician) and Advanced Practice Providers or APPs (Physician Assistants and Nurse Practitioners) who all work together to provide you with the care you need, when you need it.  Your next appointment:   6 month(s)  Provider:   Oneil Bigness, MD   We recommend signing up for the patient portal called "MyChart".  Sign up information is provided on this After Visit Summary.  MyChart is used to connect with patients for Virtual Visits (Telemedicine).  Patients are able to view lab/test results, encounter notes, upcoming appointments, etc.  Non-urgent messages can be sent to your provider as well.   To learn more about what you can do with MyChart, go to [email or web address].

## 2023-07-30 NOTE — Telephone Encounter (Signed)
ATC x1 LVM for patient to call  our office back regarding sleep study result's.  

## 2023-07-31 DIAGNOSIS — M25511 Pain in right shoulder: Secondary | ICD-10-CM | POA: Diagnosis not present

## 2023-07-31 DIAGNOSIS — M25611 Stiffness of right shoulder, not elsewhere classified: Secondary | ICD-10-CM | POA: Diagnosis not present

## 2023-07-31 NOTE — Telephone Encounter (Signed)
 Copied from CRM [id number]. Topic: Clinical - Lab/Test Results >> Jul 30, 2023  8:53 AM Isabell A wrote: Reason for CRM: Patient would like to discuss sleep study results. >> Jul 30, 2023 11:51 AM Crist Dominion wrote: Patient states he missed a call from [place] and is requesting him to call her back at her earliest convenience.   Spoke with patient regarding sleep study   Date of study: 07/12/2023   Impression: Moderate obstructive sleep apnea with moderate oxygen desaturations   Recommendation: DME referral   Recommend CPAP therapy for moderate obstructive sleep apnea   Auto titrating CPAP with pressure settings of 5-15 will be appropriate   Encourage weight loss measures   Other options of treatment of sleep disordered breathing may also be considered including surgical options   Follow-up in the office 4 to 6 weeks following initiation of treatment   Order has been placed . Patient's voice was understanding.Nothing else further needed.

## 2023-07-31 NOTE — Telephone Encounter (Signed)
 Copied from CRM [id number]. Topic: General - Other >> Jul 31, 2023 11:54 AM Eveleen Hinds B wrote: Reason for CRM: Patient returning a call to [place]. Please call.

## 2023-08-02 NOTE — Telephone Encounter (Signed)
 Please let patient know HST 07/12/23 showed moderate OSA, average 16 apneic events per hour. Recommend patient be started on auto CPAP 5-15cm h20. Please place order if patient agreeing with plan and set up OV in 31-90 days

## 2023-08-05 DIAGNOSIS — M25511 Pain in right shoulder: Secondary | ICD-10-CM | POA: Diagnosis not present

## 2023-08-07 ENCOUNTER — Ambulatory Visit (HOSPITAL_BASED_OUTPATIENT_CLINIC_OR_DEPARTMENT_OTHER)
Admission: RE | Admit: 2023-08-07 | Discharge: 2023-08-07 | Disposition: A | Discharge status: Home / Self Care | Payer: Medicare HMO | Source: Ambulatory Visit | Attending: Vascular Surgery | Admitting: Vascular Surgery

## 2023-08-07 ENCOUNTER — Other Ambulatory Visit: Payer: Self-pay

## 2023-08-07 ENCOUNTER — Encounter: Payer: Self-pay | Admitting: Vascular Surgery

## 2023-08-07 ENCOUNTER — Ambulatory Visit (HOSPITAL_COMMUNITY)
Admission: RE | Admit: 2023-08-07 | Discharge: 2023-08-07 | Disposition: A | Discharge status: Home / Self Care | Payer: Medicare HMO | Source: Ambulatory Visit | Attending: Vascular Surgery | Admitting: Vascular Surgery

## 2023-08-07 ENCOUNTER — Ambulatory Visit: Payer: Medicare HMO | Admitting: Vascular Surgery

## 2023-08-07 VITALS — BP 145/76 | HR 71 | Temp 98.2°F | Resp 20 | Ht 71.0 in | Wt 225.0 lb

## 2023-08-07 DIAGNOSIS — I6523 Occlusion and stenosis of bilateral carotid arteries: Secondary | ICD-10-CM | POA: Insufficient documentation

## 2023-08-07 DIAGNOSIS — I723 Aneurysm of iliac artery: Secondary | ICD-10-CM | POA: Diagnosis not present

## 2023-08-07 NOTE — Progress Notes (Signed)
 Patient ID: Samuel Berger, male   DOB: 1945/12/05, 78 y.o.   MRN: [id number]  Reason for Consult: Follow-up   Referred by [name], MD  Subjective:     HPI:  Samuel Berger is a 78 y.o. male with history of carotid artery stenosis status post carotid endarterectomy now here today with carotid artery duplex.  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  Patient denies claudication.  States that last summer he fell over a banister approximately 12 to 15 feet and does have left leg pain from that and also has bilateral shoulder pain which is stable but denies any frank claudication, tissue loss or ulceration.  Past Medical History:  Diagnosis Date   Aneurysm artery, iliac (HCC)    Arrhythmia    Carotid artery occlusion    Dyslipidemia    GERD (gastroesophageal reflux disease)    Hepatitis    ? age 28 or 2   Hyperlipidemia    Hypertension    Iliac artery aneurysm, right (HCC)    large b cell lymphoma dx'd 02/2016   Stroke (HCC)    01-05-2012   02-2012   Type II or unspecified type diabetes mellitus without mention of complication, not stated as uncontrolled    Family History  Problem Relation Age of Onset   Diabetes Mother    Lung cancer Father    Past Surgical History:  Procedure Laterality Date   basket procedure     CAROTID ENDARTERECTOMY     ENDARTERECTOMY  02/08/2012   Procedure: ENDARTERECTOMY CAROTID;  Surgeon: [name], MD;  Location: [facility] OR;  Service: Vascular;  Laterality: Right;   IR GENERIC HISTORICAL  02/07/2016   IR US  GUIDE BX ASP/DRAIN 02/07/2016 [name], MD [name]   IR GENERIC HISTORICAL  02/16/2016   IR FLUORO GUIDE PORT INSERTION RIGHT 02/16/2016 [name], MD [name]   IR GENERIC HISTORICAL  02/16/2016   IR US  GUIDE VASC ACCESS RIGHT 02/16/2016 [name], MD [name]   IR REMOVAL TUN ACCESS W/ PORT W/O FL MOD SED  09/16/2018   KIDNEY STONE SURGERY      removal   PATCH ANGIOPLASTY  02/08/2012   Procedure: PATCH ANGIOPLASTY;  Surgeon: [name], MD;  Location: [facility] OR;  Service: Vascular;  Laterality: Right;  with dacron patch angioplasty   REVERSE SHOULDER ARTHROPLASTY Left 12/28/2021   Procedure: REVERSE SHOULDER ARTHROPLASTY;  Surgeon: [name], MD;  Location: [name];  Service: Orthopedics;  Laterality: Left;    TEE WITHOUT CARDIOVERSION  01/08/2012   Procedure: TRANSESOPHAGEAL ECHOCARDIOGRAM (TEE);  Surgeon: [name], MD;  Location: [facility] ENDOSCOPY;  Service: Cardiovascular;  Laterality: N/A;   TONSILLECTOMY      Short Social History:  Social History   Tobacco Use   Smoking status: Former    Current packs/day: 0.00    Average packs/day: 0.5 packs/day for 30.0 years (15.0 ttl pk-yrs)    Types: Cigars, Cigarettes    Start date: 01/04/1982    Quit date: 01/05/2012    Years since quitting: 11.5   Smokeless tobacco: Former  Substance Use Topics   Alcohol use: Yes    Alcohol/week: 3.0 standard drinks of alcohol    Types: 1 Glasses of wine, 1 Cans of beer, 1 Shots of liquor per week    Comment: quit 2 months..Patient drinks coffee daily.OCCASIONAL DRINKER    Allergies  Allergen Reactions   Bee Venom Hives  Sulfa Drugs Cross Reactors Hives    Current Outpatient Medications  Medication Sig Dispense Refill   amLODipine  (NORVASC ) 10 MG tablet Take 10 mg by mouth every morning.      atorvastatin  (LIPITOR) 40 MG tablet Take 40 mg by mouth at bedtime.     Cholecalciferol (VITAMIN D3) 25 MCG TABS Take 1,000 mcg by mouth daily.     ELIQUIS 5 MG TABS tablet Take 5 mg by mouth 2 (two) times daily.     lisinopril  (PRINIVIL ,ZESTRIL ) 5 MG tablet Take 5 mg by mouth daily.     MAGNESIUM  OXIDE PO Take 200 mg by mouth 2 (two) times daily.     metFORMIN (GLUCOPHAGE-XR) 500 MG 24 hr tablet Take 500 mg by mouth 2 (two) times daily with a meal.     metoprolol  succinate (TOPROL  XL) 25 MG 24 hr tablet Take 1 tablet (25mg  ) daily  at night. 90 tablet 3   TRELEGY ELLIPTA 100-62.5-25 MCG/ACT AEPB Inhale 1 puff into the lungs daily.     No current facility-administered medications for this visit.    Review of Systems  Constitutional:  Constitutional negative. HENT: HENT negative.  Eyes: Eyes negative.  Respiratory: Positive for shortness of breath.  Cardiovascular: Cardiovascular negative.  GI: Gastrointestinal negative.  Musculoskeletal: Musculoskeletal negative. Positive for leg pain.  Skin: Skin negative.  Neurological: Neurological negative. Hematologic: Hematologic/lymphatic negative.  Psychiatric: Psychiatric negative.        Objective:  Objective   Vitals:   08/07/23 1019  BP: (!) 145/76  Pulse: 71  Resp: 20  Temp: 98.2 F (36.8 C)  SpO2: 96%      Physical Exam HENT:     Head: Normocephalic.     Nose: Nose normal.  Eyes:     Pupils: Pupils are equal, round, and reactive to light.  Neck:     Vascular: No carotid bruit.  Cardiovascular:     Rate and Rhythm: Normal rate.     Pulses:          Femoral pulses are 0 on the right side and 0 on the left side. Pulmonary:     Effort: Pulmonary effort is normal.  Abdominal:     General: Abdomen is flat.  Musculoskeletal:        General: Normal range of motion.     Right lower leg: No edema.     Left lower leg: No edema.  Skin:    General: Skin is warm.     Capillary Refill: Capillary refill takes less than 2 seconds.  Neurological:     General: No focal deficit present.     Mental Status: He is alert.  Psychiatric:        Mood and Affect: Mood normal.     Data: Right Carotid Findings:  +----------+--------+--------+--------+------------------+--------+           PSV cm/sEDV cm/sStenosisPlaque DescriptionComments  +----------+--------+--------+--------+------------------+--------+  CCA Prox  48      6                                           +----------+--------+--------+--------+------------------+--------+  CCA  Mid   72      14              heterogenous                +----------+--------+--------+--------+------------------+--------+  CCA Distal82  11              heterogenous                +----------+--------+--------+--------+------------------+--------+  ICA Prox  57      10      1-39%   heterogenous                +----------+--------+--------+--------+------------------+--------+  ICA Mid   62      16                                          +----------+--------+--------+--------+------------------+--------+  ICA Distal64      14                                          +----------+--------+--------+--------+------------------+--------+  ECA      96      0                                           +----------+--------+--------+--------+------------------+--------+   +----------+--------+-------+----------------+-------------------+           PSV cm/sEDV cmsDescribe        Arm Pressure (mmHG)  +----------+--------+-------+----------------+-------------------+  Subclavian100    3      Multiphasic, [id number]                  +----------+--------+-------+----------------+-------------------+   +---------+--------+--+--------+-+---------+  VertebralPSV cm/s42EDV cm/s0Antegrade  +---------+--------+--+--------+-+---------+      Left Carotid Findings:  +----------+--------+--------+--------+------------------+--------+           PSV cm/sEDV cm/sStenosisPlaque DescriptionComments  +----------+--------+--------+--------+------------------+--------+  CCA Prox  78      12                                          +----------+--------+--------+--------+------------------+--------+  CCA Mid   98      16              heterogenous                +----------+--------+--------+--------+------------------+--------+  CCA Distal86      13              heterogenous                 +----------+--------+--------+--------+------------------+--------+  ICA Prox  121     18      1-39%   heterogenous                +----------+--------+--------+--------+------------------+--------+  ICA Mid   189     17                                          +----------+--------+--------+--------+------------------+--------+  ICA Distal62      13                                          +----------+--------+--------+--------+------------------+--------+  ECA  201     14                                          +----------+--------+--------+--------+------------------+--------+   +----------+--------+--------+----------------+-------------------+           PSV cm/sEDV cm/sDescribe        Arm Pressure (mmHG)  +----------+--------+--------+----------------+-------------------+  [id number]    3       Multiphasic, [id number]                  +----------+--------+--------+----------------+-------------------+   +---------+--------+--+--------+-+----------------------+  VertebralPSV cm/s30EDV cm/s0Antegrade and Atypical  +---------+--------+--+--------+-+----------------------+         Summary:  Right Carotid: Velocities in the right ICA are consistent with a 1-39%  stenosis.   Left Carotid: Velocities in the left ICA are consistent with a 1-39%  stenosis.   Vertebrals: Bilateral vertebral arteries demonstrate antegrade flow.  Subclavians: Normal flow hemodynamics were seen in bilateral subclavian               arteries.   Abdominal Aorta Findings:  +-------------+-------+----------+----------+--------+--------+--------+  Location    AP (cm)Trans (cm)PSV (cm/s)WaveformThrombusComments  +-------------+-------+----------+----------+--------+--------+--------+  Proximal    2.17   2.23      43                                  +-------------+-------+----------+----------+--------+--------+--------+  Mid         2.45    2.20      61                                  +-------------+-------+----------+----------+--------+--------+--------+  Distal      2.65             28                                  +-------------+-------+----------+----------+--------+--------+--------+  RT CIA Prox  3.3    4.0       482                                 +-------------+-------+----------+----------+--------+--------+--------+  RT CIA Distal                 178                                 +-------------+-------+----------+----------+--------+--------+--------+  RT EIA Prox                   315                                 +-------------+-------+----------+----------+--------+--------+--------+  RT EIA Mid                    100                                 +-------------+-------+----------+----------+--------+--------+--------+  RT EIA Distal  289                                 +-------------+-------+----------+----------+--------+--------+--------+  LT CIA Prox                   128                                 +-------------+-------+----------+----------+--------+--------+--------+  LT CIA Mid                    100                                 +-------------+-------+----------+----------+--------+--------+--------+  LT CIA Distal                 37                                  +-------------+-------+----------+----------+--------+--------+--------+  LT EIA Prox                   37                                  +-------------+-------+----------+----------+--------+--------+--------+  LT EIA Mid                    69                                  +-------------+-------+----------+----------+--------+--------+--------+  LT EIA Distal                 64                                  +-------------+-------+----------+----------+--------+--------+--------+       Summary:   Abdominal Aorta: The largest aortic diameter has increased compared to  prior exam. Previous diameter measurement was 2.6 cm obtained on 08/09/2022.  The right common iliac artery aneurysm measures 3.3 x 4.0 cm. Prior  measurement was 3.1 x 3.2 cm. Greater  than > 50% stenosis right CIA,      Assessment/Plan:     78 year old male with history as above now with stable low-grade stenosis bilateral carotid arteries after carotid endarterectomy over 10 years ago.  From the standpoint we can probably push him out at least 2 years probably more like 3 to 4 years until repeat duplex.  He does have an increase size of his right common iliac artery aneurysm measuring 4 cm by duplex today with associated stenosis and a known occluded left common iliac artery but without lower extremity symptoms.  Plan will be for CT angio evaluation and follow-up in 4 to 6 weeks to discuss options moving forward.  All questions were answered he demonstrates good understanding.     Adine Hoof MD [facility]

## 2023-08-23 ENCOUNTER — Ambulatory Visit
Admission: RE | Admit: 2023-08-23 | Discharge: 2023-08-23 | Disposition: A | Discharge status: Home / Self Care | Source: Ambulatory Visit | Attending: Vascular Surgery | Admitting: Vascular Surgery

## 2023-08-23 DIAGNOSIS — I723 Aneurysm of iliac artery: Secondary | ICD-10-CM | POA: Diagnosis not present

## 2023-08-23 DIAGNOSIS — I701 Atherosclerosis of renal artery: Secondary | ICD-10-CM | POA: Diagnosis not present

## 2023-08-23 MED ORDER — IOPAMIDOL (ISOVUE-370) INJECTION 76%
75.0000 mL | Freq: Once | INTRAVENOUS | Status: AC | PRN
  Administered 2023-08-23: 75 mL via INTRAVENOUS

## 2023-08-28 DIAGNOSIS — M65331 Trigger finger, right middle finger: Secondary | ICD-10-CM | POA: Diagnosis not present

## 2023-09-04 ENCOUNTER — Ambulatory Visit: Admitting: Vascular Surgery

## 2023-09-11 ENCOUNTER — Ambulatory Visit: Attending: Vascular Surgery | Admitting: Vascular Surgery

## 2023-09-11 ENCOUNTER — Encounter: Payer: Self-pay | Admitting: Vascular Surgery

## 2023-09-11 VITALS — BP 147/82 | HR 69 | Temp 98.4°F | Ht 71.0 in | Wt 225.0 lb

## 2023-09-11 DIAGNOSIS — I723 Aneurysm of iliac artery: Secondary | ICD-10-CM | POA: Diagnosis not present

## 2023-09-11 DIAGNOSIS — I6523 Occlusion and stenosis of bilateral carotid arteries: Secondary | ICD-10-CM

## 2023-09-11 NOTE — Progress Notes (Signed)
 Patient ID: Samuel Berger, male   DOB: 1945/08/14, 78 y.o.   MRN: [id number]  Reason for Consult: Follow-up   Referred by [name], MD  Subjective:     HPI:  Samuel Berger is a 78 y.o. 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  He states that he does have occasional leg pain but denies frank claudication does not have tissue loss or ulceration.  More recently he was evaluated with duplex and found to have a 4 cm aneurysm and follows up with CT scan to discuss results today.  He has no new complaints.  Past Medical History:  Diagnosis Date   Aneurysm artery, iliac (HCC)    Arrhythmia    Carotid artery occlusion    Dyslipidemia    GERD (gastroesophageal reflux disease)    Hepatitis    ? age 74 or 67   Hyperlipidemia    Hypertension    Iliac artery aneurysm, right (HCC)    large b cell lymphoma dx'd 02/2016   Stroke (HCC)    01-05-2012   02-2012   Type II or unspecified type diabetes mellitus without mention of complication, not stated as uncontrolled    Family History  Problem Relation Age of Onset   Diabetes Mother    Lung cancer Father    Past Surgical History:  Procedure Laterality Date   basket procedure     CAROTID ENDARTERECTOMY     ENDARTERECTOMY  02/08/2012   Procedure: ENDARTERECTOMY CAROTID;  Surgeon: [name], MD;  Location: [name];  Service: Vascular;  Laterality: Right;   IR GENERIC HISTORICAL  02/07/2016   IR US  GUIDE BX ASP/DRAIN 02/07/2016 [name], MD [name]   IR GENERIC HISTORICAL  02/16/2016   IR FLUORO GUIDE PORT INSERTION RIGHT 02/16/2016 [name], MD [name]   IR GENERIC HISTORICAL  02/16/2016   IR US  GUIDE VASC ACCESS RIGHT 02/16/2016 [name], MD [name]   IR REMOVAL TUN ACCESS W/ PORT W/O FL MOD SED  09/16/2018   KIDNEY STONE SURGERY     removal   PATCH ANGIOPLASTY  02/08/2012   Procedure: PATCH  ANGIOPLASTY;  Surgeon: [name], MD;  Location: [name] OR;  Service: Vascular;  Laterality: Right;  with dacron patch angioplasty   REVERSE SHOULDER ARTHROPLASTY Left 12/28/2021   Procedure: REVERSE SHOULDER ARTHROPLASTY;  Surgeon: [name], MD;  Location: [name];  Service: Orthopedics;  Laterality: Left;    TEE WITHOUT CARDIOVERSION  01/08/2012   Procedure: TRANSESOPHAGEAL ECHOCARDIOGRAM (TEE);  Surgeon: [name], MD;  Location: [name] ENDOSCOPY;  Service: Cardiovascular;  Laterality: N/A;   TONSILLECTOMY      Short Social History:  Social History   Tobacco Use   Smoking status: Former    Current packs/day: 0.00    Average packs/day: 0.5 packs/day for 30.0 years (15.0 ttl pk-yrs)    Types: Cigars, Cigarettes    Start date: 01/04/1982    Quit date: 01/05/2012    Years since quitting: 11.6   Smokeless tobacco: Former  Substance Use Topics   Alcohol use: Yes    Alcohol/week: 3.0 standard drinks of alcohol    Types: 1 Glasses of wine, 1 Cans of beer, 1 Shots of liquor per week    Comment: quit 2 months..Patient drinks coffee daily.OCCASIONAL DRINKER    Allergies  Allergen Reactions   Bee Venom Hives   Sulfa Drugs Cross Reactors Hives  Current Outpatient Medications  Medication Sig Dispense Refill   amLODipine  (NORVASC ) 10 MG tablet Take 10 mg by mouth every morning.      atorvastatin  (LIPITOR) 40 MG tablet Take 40 mg by mouth at bedtime.     Cholecalciferol (VITAMIN D3) 25 MCG TABS Take 1,000 mcg by mouth daily.     ELIQUIS 5 MG TABS tablet Take 5 mg by mouth 2 (two) times daily.     lisinopril  (PRINIVIL ,ZESTRIL ) 5 MG tablet Take 5 mg by mouth daily.     MAGNESIUM  OXIDE PO Take 200 mg by mouth 2 (two) times daily.     metFORMIN (GLUCOPHAGE-XR) 500 MG 24 hr tablet Take 500 mg by mouth 2 (two) times daily with a meal.     metoprolol  succinate (TOPROL  XL) 25 MG 24 hr tablet Take 1 tablet (25mg  ) daily at night. 90 tablet 3   TRELEGY ELLIPTA 100-62.5-25 MCG/ACT  AEPB Inhale 1 puff into the lungs daily.     No current facility-administered medications for this visit.    Review of Systems  Constitutional:  Constitutional negative. HENT: HENT negative.  Eyes: Eyes negative.  Respiratory: Respiratory negative.  Cardiovascular: Cardiovascular negative.  GI: Gastrointestinal negative.  Musculoskeletal: Positive for leg pain.  Skin: Skin negative.  Neurological: Neurological negative. Hematologic: Hematologic/lymphatic negative.  Psychiatric: Psychiatric negative.        Objective:  Objective   Vitals:   09/11/23 0958  BP: (!) 147/82  Pulse: 69  Temp: 98.4 F (36.9 C)  SpO2: 96%  Weight: 225 lb (102.1 kg)  Height: 5' 11 (1.803 m)   Body mass index is 31.38 kg/m.  Physical Exam HENT:     Head: Normocephalic.     Nose: Nose normal.  Eyes:     Pupils: Pupils are equal, round, and reactive to light.  Cardiovascular:     Pulses:          Femoral pulses are 2+ on the right side and 0 on the left side. Abdominal:     General: Abdomen is flat.     Palpations: Abdomen is soft.  Musculoskeletal:     Cervical back: Normal range of motion.  Skin:    Capillary Refill: Capillary refill takes less than 2 seconds.  Neurological:     General: No focal deficit present.     Mental Status: He is alert.  Psychiatric:        Mood and Affect: Mood normal.        Thought Content: Thought content normal.        Judgment: Judgment normal.     Data: EXAMINATION: CT ANGIO ABDOMEN PELVIS  W & WO CONTRAST   CLINICAL INDICATION: Male, 78 years old. Aneurysm of iliac artery (HCC)   TECHNIQUE: Axial CTA of the abdomen and pelvis with and without 100 cc Isovue  300 intravenous contrast. Multiplanar and 3D reformations provided. Unless otherwise specified, incidental thyroid , adrenal, renal lesions do not require dedicated imaging follow up. Additionally, any mentioned pulmonary nodules do not require dedicated imaging follow-up based on the  Fleischner guidelines unless otherwise specified. Coronary calcifications are not identified unless otherwise specified.   COMPARISON: 08/05/2019   FINDINGS:   The abdominal aorta is normal in caliber. Scattered atherosclerotic changes are present. The celiac artery is patent. The SMA is patent. The left renal artery is patent. There is greater than 75% stenosis of the proximal right renal artery.   The IMA origin is occluded.   There is chronic occlusion of the  left common iliac artery.   The right common iliac artery is patent and aneurysmal measuring 3.0 cm.   The proximal right internal iliac artery is occluded. The left internal iliac artery is patent. The external iliac arteries are patent. The common femoral arteries and visualized portions of the superficial and deep femoral arteries appear patent.   The lung bases are clear. The heart is normal in size. There are coronary calcifications. The liver appears normal. There is cholelithiasis. The spleen is normal. The pancreas is normal. The adrenals are normal. The right kidney is mildly atrophic. Left renal cyst is seen. The urinary bladder is normal. The prostate is mildly enlarged. Small fat-containing left inguinal hernia. Large and small bowel loops are otherwise within normal limits. No free fluid or adenopathy. There are degenerative changes of the spine and bony pelvis.   IMPRESSION:   Right common iliac artery aneurysm now measures 3.0 cm. Recommend follow-up with a vascular specialist.   Chronic occlusion of the left common iliac artery.   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     Assessment/Plan:     78 year old male with history as above initially thought to have 4 cm right common iliac artery aneurysm by recent duplex now confirmed at 3 cm.  We discussed that at 3 cm he really does not require repair and can follow-up in 1 year with  repeat aortoiliac duplex and we will also obtain ABIs at that time.  He does need diligent care of his feet given his known arterial occlusive disease.  We had previously discussed reevaluating his carotid arteries in 2 years and will be 1 year from the next visit.  All questions were answered he demonstrates good understanding.     Adine Hoof MD [facility]

## 2023-10-10 DIAGNOSIS — D6869 Other thrombophilia: Secondary | ICD-10-CM | POA: Diagnosis not present

## 2023-10-10 DIAGNOSIS — Z8673 Personal history of transient ischemic attack (TIA), and cerebral infarction without residual deficits: Secondary | ICD-10-CM | POA: Diagnosis not present

## 2023-10-10 DIAGNOSIS — I739 Peripheral vascular disease, unspecified: Secondary | ICD-10-CM | POA: Diagnosis not present

## 2023-10-10 DIAGNOSIS — C8510 Unspecified B-cell lymphoma, unspecified site: Secondary | ICD-10-CM | POA: Diagnosis not present

## 2023-10-10 DIAGNOSIS — I4891 Unspecified atrial fibrillation: Secondary | ICD-10-CM | POA: Diagnosis not present

## 2023-10-10 DIAGNOSIS — M545 Low back pain, unspecified: Secondary | ICD-10-CM | POA: Diagnosis not present

## 2023-10-10 DIAGNOSIS — I10 Essential (primary) hypertension: Secondary | ICD-10-CM | POA: Diagnosis not present

## 2023-10-10 DIAGNOSIS — E1151 Type 2 diabetes mellitus with diabetic peripheral angiopathy without gangrene: Secondary | ICD-10-CM | POA: Diagnosis not present

## 2023-10-10 DIAGNOSIS — J449 Chronic obstructive pulmonary disease, unspecified: Secondary | ICD-10-CM | POA: Diagnosis not present

## 2023-11-07 DIAGNOSIS — H353111 Nonexudative age-related macular degeneration, right eye, early dry stage: Secondary | ICD-10-CM | POA: Diagnosis not present

## 2023-11-07 DIAGNOSIS — H53143 Visual discomfort, bilateral: Secondary | ICD-10-CM | POA: Diagnosis not present

## 2023-11-07 DIAGNOSIS — H5203 Hypermetropia, bilateral: Secondary | ICD-10-CM | POA: Diagnosis not present

## 2023-11-07 DIAGNOSIS — H3530 Unspecified macular degeneration: Secondary | ICD-10-CM | POA: Diagnosis not present

## 2023-11-07 DIAGNOSIS — E119 Type 2 diabetes mellitus without complications: Secondary | ICD-10-CM | POA: Diagnosis not present

## 2023-11-20 DIAGNOSIS — H3582 Retinal ischemia: Secondary | ICD-10-CM | POA: Diagnosis not present

## 2023-11-20 DIAGNOSIS — H43823 Vitreomacular adhesion, bilateral: Secondary | ICD-10-CM | POA: Diagnosis not present

## 2023-11-20 DIAGNOSIS — H26492 Other secondary cataract, left eye: Secondary | ICD-10-CM | POA: Diagnosis not present

## 2023-11-20 DIAGNOSIS — H353114 Nonexudative age-related macular degeneration, right eye, advanced atrophic with subfoveal involvement: Secondary | ICD-10-CM | POA: Diagnosis not present

## 2023-11-27 ENCOUNTER — Ambulatory Visit: Admitting: Primary Care

## 2023-12-17 DIAGNOSIS — M79652 Pain in left thigh: Secondary | ICD-10-CM | POA: Diagnosis not present

## 2023-12-17 DIAGNOSIS — M5459 Other low back pain: Secondary | ICD-10-CM | POA: Diagnosis not present

## 2024-01-02 DIAGNOSIS — M1612 Unilateral primary osteoarthritis, left hip: Secondary | ICD-10-CM | POA: Diagnosis not present

## 2024-01-07 DIAGNOSIS — Z961 Presence of intraocular lens: Secondary | ICD-10-CM | POA: Diagnosis not present

## 2024-01-07 DIAGNOSIS — H47012 Ischemic optic neuropathy, left eye: Secondary | ICD-10-CM | POA: Diagnosis not present

## 2024-01-07 DIAGNOSIS — H02831 Dermatochalasis of right upper eyelid: Secondary | ICD-10-CM | POA: Diagnosis not present

## 2024-01-07 DIAGNOSIS — H26492 Other secondary cataract, left eye: Secondary | ICD-10-CM | POA: Diagnosis not present

## 2024-01-07 DIAGNOSIS — H353132 Nonexudative age-related macular degeneration, bilateral, intermediate dry stage: Secondary | ICD-10-CM | POA: Diagnosis not present

## 2024-05-08 ENCOUNTER — Ambulatory Visit: Admitting: Urology

## 2024-05-12 ENCOUNTER — Ambulatory Visit: Admitting: Urology
